# Patient Record
Sex: Female | Born: 1970 | Hispanic: No | Smoking: Never smoker
Health system: Southern US, Community
[De-identification: ages and names within clinical notes are randomized; demographics above are authoritative.]

## PROBLEM LIST (undated history)

## (undated) DIAGNOSIS — F32A Depression, unspecified: Secondary | ICD-10-CM

## (undated) DIAGNOSIS — M549 Dorsalgia, unspecified: Secondary | ICD-10-CM

## (undated) DIAGNOSIS — K810 Acute cholecystitis: Secondary | ICD-10-CM

## (undated) DIAGNOSIS — F419 Anxiety disorder, unspecified: Secondary | ICD-10-CM

## (undated) DIAGNOSIS — E785 Hyperlipidemia, unspecified: Secondary | ICD-10-CM

## (undated) DIAGNOSIS — E119 Type 2 diabetes mellitus without complications: Secondary | ICD-10-CM

## (undated) DIAGNOSIS — T7840XA Allergy, unspecified, initial encounter: Secondary | ICD-10-CM

## (undated) DIAGNOSIS — R7303 Prediabetes: Secondary | ICD-10-CM

## (undated) DIAGNOSIS — F988 Other specified behavioral and emotional disorders with onset usually occurring in childhood and adolescence: Secondary | ICD-10-CM

## (undated) DIAGNOSIS — N3281 Overactive bladder: Secondary | ICD-10-CM

## (undated) DIAGNOSIS — D649 Anemia, unspecified: Secondary | ICD-10-CM

## (undated) DIAGNOSIS — F329 Major depressive disorder, single episode, unspecified: Secondary | ICD-10-CM

## (undated) DIAGNOSIS — E079 Disorder of thyroid, unspecified: Secondary | ICD-10-CM

## (undated) DIAGNOSIS — N760 Acute vaginitis: Secondary | ICD-10-CM

## (undated) HISTORY — PX: FRACTURE SURGERY: SHX138

## (undated) HISTORY — DX: Disorder of thyroid, unspecified: E07.9

## (undated) HISTORY — PX: EYE SURGERY: SHX253

## (undated) HISTORY — DX: Allergy, unspecified, initial encounter: T78.40XA

## (undated) HISTORY — PX: AUGMENTATION MAMMAPLASTY: SUR837

## (undated) HISTORY — DX: Anemia, unspecified: D64.9

## (undated) HISTORY — DX: Major depressive disorder, single episode, unspecified: F32.9

## (undated) HISTORY — DX: Hyperlipidemia, unspecified: E78.5

## (undated) HISTORY — DX: Overactive bladder: N32.81

## (undated) HISTORY — DX: Depression, unspecified: F32.A

## (undated) HISTORY — DX: Anxiety disorder, unspecified: F41.9

## (undated) HISTORY — PX: OTHER SURGICAL HISTORY: SHX169

## (undated) HISTORY — DX: Other specified behavioral and emotional disorders with onset usually occurring in childhood and adolescence: F98.8

## (undated) HISTORY — PX: SMALL INTESTINE SURGERY: SHX150

## (undated) HISTORY — DX: Acute vaginitis: N76.0

## (undated) NOTE — *Deleted (*Deleted)
Chronic Care Management Pharmacy  Name: Lorali Khamis  MRN: [id number] DOB: [birth year]  Chief Complaint/ HPI  Barbara Anderson,  54 y.o. , female presents for their Follow-Up CCM visit with the clinical pharmacist via telephone due to COVID-19 Pandemic.  PCP : Margaretann Loveless, PA-C  Their chronic conditions include: ***  Office Visits:***  Consult Visit:***  Medications: Outpatient Encounter Medications as of 05/16/2020  Medication Sig  . Alcohol Swabs (B-D SINGLE USE SWABS REGULAR) PADS USE TO CLEANSE SKIN EVERY DAY BEFORE CHECKING BLOOD SUGAR  . ALPRAZolam (XANAX) 1 MG tablet Take 1 mg by mouth 2 (two) times daily as needed.   Marland Kitchen amphetamine-dextroamphetamine (ADDERALL) 20 MG tablet Take 1 tablet (20 mg total) by mouth in the morning, at noon, and at bedtime. Has not started yet 11/10/19  . ARIPiprazole (ABILIFY) 5 MG tablet Take 5 mg by mouth daily.  Marland Kitchen atorvastatin (LIPITOR) 20 MG tablet Take 1 tablet (20 mg total) by mouth daily.  . cephALEXin (KEFLEX) 500 MG capsule Take 1 capsule (500 mg total) by mouth 2 (two) times daily.  . Cholecalciferol (VITAMIN D3) 25 MCG (1000 UT) CAPS Take 1 capsule (1,000 Units total) by mouth daily.  . DULoxetine (CYMBALTA) 60 MG capsule Take 2 capsules (120 mg total) by mouth daily.  . fluticasone (FLONASE) 50 MCG/ACT nasal spray USE 2 SPRAYS IN EACH NOSTRIL EVERY DAY  . folic acid (QC FOLIC ACID) 829 MCG tablet Take 1 tablet (800 mcg total) by mouth daily.  . Lancets Misc. (ACCU-CHEK FASTCLIX LANCET) KIT To check blood sugar once daily  . levothyroxine (SYNTHROID) 25 MCG tablet TAKE 1 TABLET EVERY DAY BEFORE BREAKFAST  . pregabalin (LYRICA) 200 MG capsule Take 1 capsule (200 mg total) by mouth 2 (two) times daily.  . QUEtiapine Fumarate (SEROQUEL XR) 150 MG 24 hr tablet Take 150 mg by mouth at bedtime.   . topiramate (TOPAMAX) 200 MG tablet Take 1 tablet (200 mg total) by mouth at bedtime.  . traZODone (DESYREL) 150 MG tablet Take 1 tablet (150  mg total) by mouth at bedtime. (Patient not taking: Reported on 01/13/2020)   No facility-administered encounter medications on file as of 05/16/2020.     Current Diagnosis/Assessment:  Goals Addressed   None    Diabetes   Recent Relevant Labs: Lab Results  Component Value Date/Time   HGBA1C 5.9 (H) 02/04/2020 02:58 PM   HGBA1C 6.0 (H) 10/05/2019 03:28 PM   MICROALBUR 20 02/04/2020 02:46 PM   MICROALBUR 50 07/21/2018 04:34 PM     Checking BG: {CHL HP Blood Glucose Monitoring [id number]  Recent FBG Readings: Recent pre-meal BG readings: *** Recent 2hr PP BG readings:  *** Recent HS BG readings: *** Patient has failed these meds in past: *** Patient is currently {CHL Controlled/Uncontrolled:[phone]} on the following medications: ***  Last diabetic Foot exam: No results found for: HMDIABEYEEXA  Last diabetic Eye exam: No results found for: HMDIABFOOTEX   We discussed: {CHL HP Upstream Pharmacy discussion:[phone]}  Plan  Continue {CHL HP Upstream Pharmacy Plans:[phone]}   Hyperlipidemia   LDL goal < ***  Lipid Panel     Component Value Date/Time   CHOL 157 02/04/2020 1458   TRIG 117 02/04/2020 1458   HDL 55 02/04/2020 1458   LDLCALC 81 02/04/2020 1458    Hepatic Function Latest Ref Rng & Units 02/04/2020 10/05/2019 01/07/2019  Total Protein 6.0 - 8.5 g/dL 5.6(O) 6.9 1.3(Y)  Albumin 3.8 - 4.8 g/dL 3.8 4.2 3.1(L)  AST 0 -  40 IU/L 22 18 35  ALT 0 - 32 IU/L 22 18 35  Alk Phosphatase 48 - 121 IU/L 98 155(H) 90  Total Bilirubin 0.0 - 1.2 mg/dL 0.2 0.3 0.4  Bilirubin, Direct 0.0 - 0.2 mg/dL - - -     The 16-XWRU ASCVD risk score Denman George DC Jr., et al., 2013) is: 2.5%   Values used to calculate the score:     Age: 47 years     Sex: Female     Is Non-Hispanic African American: No     Diabetic: Yes     Tobacco smoker: No     Systolic Blood Pressure: 156 mmHg     Is BP treated: No     HDL Cholesterol: 55 mg/dL     Total Cholesterol: 157 mg/dL    Patient has failed these meds in past: *** Patient is currently {CHL Controlled/[phone]} on the following medications:  . ***  We discussed:  {CHL HP Upstream Pharmacy discussion:[id number]}  Plan  Continue {CHL HP Upstream Pharmacy Plans:[id number]}   Medication Management   Pt uses *** pharmacy for all medications Uses pill box? {Yes or If no, why not?:20788} Pt endorses ***% compliance  We discussed:  Xanax prn? Cymbalta 120mg  daily? Seroquel and Abilify?   Plan  {US Pharmacy EAVW:09811}    Follow up: *** month phone visit  ***

---
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
## 1993-08-12 DIAGNOSIS — F419 Anxiety disorder, unspecified: Secondary | ICD-10-CM | POA: Insufficient documentation

## 1999-01-06 DIAGNOSIS — G542 Cervical root disorders, not elsewhere classified: Secondary | ICD-10-CM | POA: Insufficient documentation

## 1999-01-06 DIAGNOSIS — M5137 Other intervertebral disc degeneration, lumbosacral region: Secondary | ICD-10-CM | POA: Insufficient documentation

## 1999-12-12 ENCOUNTER — Other Ambulatory Visit: Admission: RE | Admit: 1999-12-12 | Discharge: 1999-12-12 | Payer: Self-pay | Admitting: Obstetrics and Gynecology

## 1999-12-12 ENCOUNTER — Other Ambulatory Visit: Admission: RE | Admit: 1999-12-12 | Discharge: 1999-12-12 | Payer: Self-pay | Admitting: *Deleted

## 2000-01-26 ENCOUNTER — Encounter: Payer: Self-pay | Admitting: Emergency Medicine

## 2000-01-26 ENCOUNTER — Emergency Department (HOSPITAL_COMMUNITY): Admission: EM | Admit: 2000-01-26 | Discharge: 2000-01-26 | Payer: Self-pay | Admitting: Emergency Medicine

## 2000-06-26 ENCOUNTER — Encounter: Admission: RE | Admit: 2000-06-26 | Discharge: 2000-06-26 | Payer: Self-pay | Admitting: Neurology

## 2000-06-26 ENCOUNTER — Encounter: Payer: Self-pay | Admitting: Neurology

## 2000-10-07 ENCOUNTER — Other Ambulatory Visit: Admission: RE | Admit: 2000-10-07 | Discharge: 2000-10-07 | Payer: Self-pay | Admitting: Gynecology

## 2001-03-07 ENCOUNTER — Other Ambulatory Visit: Admission: RE | Admit: 2001-03-07 | Discharge: 2001-03-07 | Payer: Self-pay | Admitting: Gynecology

## 2002-05-14 ENCOUNTER — Other Ambulatory Visit: Admission: RE | Admit: 2002-05-14 | Discharge: 2002-05-14 | Payer: Self-pay | Admitting: Gynecology

## 2003-07-31 HISTORY — PX: COSMETIC SURGERY: SHX468

## 2003-12-07 ENCOUNTER — Other Ambulatory Visit: Admission: RE | Admit: 2003-12-07 | Discharge: 2003-12-07 | Payer: Self-pay | Admitting: Gynecology

## 2004-05-31 ENCOUNTER — Ambulatory Visit: Payer: Self-pay | Admitting: Pain Medicine

## 2004-06-07 ENCOUNTER — Ambulatory Visit: Payer: Self-pay | Admitting: Pain Medicine

## 2004-06-15 ENCOUNTER — Other Ambulatory Visit: Admission: RE | Admit: 2004-06-15 | Discharge: 2004-06-15 | Payer: Self-pay | Admitting: Gynecology

## 2004-07-10 ENCOUNTER — Ambulatory Visit: Payer: Self-pay | Admitting: Pain Medicine

## 2004-07-26 ENCOUNTER — Ambulatory Visit: Payer: Self-pay | Admitting: Pain Medicine

## 2004-09-06 ENCOUNTER — Ambulatory Visit: Payer: Self-pay | Admitting: Pain Medicine

## 2004-10-16 ENCOUNTER — Ambulatory Visit: Payer: Self-pay | Admitting: Pain Medicine

## 2004-10-30 ENCOUNTER — Ambulatory Visit: Payer: Self-pay | Admitting: Pain Medicine

## 2004-11-30 ENCOUNTER — Ambulatory Visit: Payer: Self-pay | Admitting: Pain Medicine

## 2004-12-06 ENCOUNTER — Ambulatory Visit: Payer: Self-pay | Admitting: Pain Medicine

## 2005-01-18 ENCOUNTER — Ambulatory Visit: Payer: Self-pay | Admitting: Pain Medicine

## 2005-02-02 ENCOUNTER — Other Ambulatory Visit: Admission: RE | Admit: 2005-02-02 | Discharge: 2005-02-02 | Payer: Self-pay | Admitting: Gynecology

## 2005-02-05 ENCOUNTER — Ambulatory Visit: Payer: Self-pay | Admitting: Pain Medicine

## 2005-02-26 ENCOUNTER — Ambulatory Visit: Payer: Self-pay | Admitting: Pain Medicine

## 2005-03-19 ENCOUNTER — Ambulatory Visit: Payer: Self-pay | Admitting: Pain Medicine

## 2005-04-18 ENCOUNTER — Ambulatory Visit: Payer: Self-pay | Admitting: Pain Medicine

## 2005-05-21 ENCOUNTER — Ambulatory Visit: Payer: Self-pay | Admitting: Pain Medicine

## 2005-05-25 ENCOUNTER — Other Ambulatory Visit: Admission: RE | Admit: 2005-05-25 | Discharge: 2005-05-25 | Payer: Self-pay | Admitting: Gynecology

## 2005-06-04 ENCOUNTER — Ambulatory Visit: Payer: Self-pay | Admitting: Pain Medicine

## 2005-07-16 ENCOUNTER — Ambulatory Visit: Payer: Self-pay | Admitting: Pain Medicine

## 2005-08-14 ENCOUNTER — Ambulatory Visit: Payer: Self-pay | Admitting: Pain Medicine

## 2005-08-22 ENCOUNTER — Ambulatory Visit: Payer: Self-pay | Admitting: Pain Medicine

## 2005-09-17 ENCOUNTER — Ambulatory Visit: Payer: Self-pay | Admitting: Pain Medicine

## 2005-10-02 ENCOUNTER — Ambulatory Visit: Payer: Self-pay | Admitting: Pain Medicine

## 2005-10-08 ENCOUNTER — Ambulatory Visit: Payer: Self-pay | Admitting: Pain Medicine

## 2005-11-06 ENCOUNTER — Ambulatory Visit: Payer: Self-pay | Admitting: Pain Medicine

## 2005-11-12 ENCOUNTER — Ambulatory Visit: Payer: Self-pay | Admitting: Pain Medicine

## 2005-12-26 ENCOUNTER — Ambulatory Visit: Payer: Self-pay | Admitting: Pain Medicine

## 2006-01-22 ENCOUNTER — Ambulatory Visit: Payer: Self-pay

## 2006-02-27 ENCOUNTER — Other Ambulatory Visit: Admission: RE | Admit: 2006-02-27 | Discharge: 2006-02-27 | Payer: Self-pay | Admitting: Gynecology

## 2006-04-16 DIAGNOSIS — G43819 Other migraine, intractable, without status migrainosus: Secondary | ICD-10-CM | POA: Insufficient documentation

## 2006-06-26 ENCOUNTER — Ambulatory Visit: Payer: Self-pay | Admitting: Pain Medicine

## 2006-07-15 ENCOUNTER — Ambulatory Visit: Payer: Self-pay | Admitting: Pain Medicine

## 2006-09-18 ENCOUNTER — Ambulatory Visit: Payer: Self-pay | Admitting: Pain Medicine

## 2006-12-18 ENCOUNTER — Ambulatory Visit: Payer: Self-pay | Admitting: Pain Medicine

## 2007-03-10 ENCOUNTER — Ambulatory Visit: Payer: Self-pay | Admitting: Pain Medicine

## 2007-04-14 ENCOUNTER — Other Ambulatory Visit: Admission: RE | Admit: 2007-04-14 | Discharge: 2007-04-14 | Payer: Self-pay | Admitting: Gynecology

## 2007-05-12 ENCOUNTER — Ambulatory Visit: Payer: Self-pay | Admitting: Pain Medicine

## 2007-06-16 ENCOUNTER — Ambulatory Visit: Payer: Self-pay | Admitting: Pain Medicine

## 2007-12-03 ENCOUNTER — Ambulatory Visit: Payer: Self-pay | Admitting: Pain Medicine

## 2008-03-08 ENCOUNTER — Ambulatory Visit: Payer: Self-pay | Admitting: Pain Medicine

## 2008-07-12 ENCOUNTER — Encounter: Payer: Self-pay | Admitting: Women's Health

## 2008-07-12 ENCOUNTER — Ambulatory Visit: Payer: Self-pay | Admitting: Women's Health

## 2008-07-12 ENCOUNTER — Other Ambulatory Visit: Admission: RE | Admit: 2008-07-12 | Discharge: 2008-07-12 | Payer: Self-pay | Admitting: Gynecology

## 2008-07-19 ENCOUNTER — Ambulatory Visit: Payer: Self-pay | Admitting: Pain Medicine

## 2008-10-04 ENCOUNTER — Ambulatory Visit: Payer: Self-pay

## 2008-10-12 ENCOUNTER — Ambulatory Visit: Payer: Self-pay | Admitting: Gastroenterology

## 2009-01-10 ENCOUNTER — Ambulatory Visit: Payer: Self-pay | Admitting: Pain Medicine

## 2009-04-11 ENCOUNTER — Ambulatory Visit: Payer: Self-pay | Admitting: Pain Medicine

## 2009-07-11 ENCOUNTER — Ambulatory Visit: Payer: Self-pay | Admitting: Pain Medicine

## 2009-07-27 ENCOUNTER — Ambulatory Visit: Payer: Self-pay | Admitting: Pain Medicine

## 2009-08-08 ENCOUNTER — Ambulatory Visit: Payer: Self-pay | Admitting: Pain Medicine

## 2009-08-16 ENCOUNTER — Ambulatory Visit: Payer: Self-pay | Admitting: Pain Medicine

## 2009-09-05 ENCOUNTER — Ambulatory Visit: Payer: Self-pay | Admitting: Pain Medicine

## 2009-09-29 ENCOUNTER — Ambulatory Visit: Payer: Self-pay | Admitting: Pain Medicine

## 2009-10-03 ENCOUNTER — Ambulatory Visit: Payer: Self-pay | Admitting: Pain Medicine

## 2009-10-27 ENCOUNTER — Ambulatory Visit: Payer: Self-pay | Admitting: Pain Medicine

## 2010-04-12 ENCOUNTER — Ambulatory Visit: Payer: Self-pay | Admitting: Pain Medicine

## 2010-05-11 ENCOUNTER — Ambulatory Visit: Payer: Self-pay | Admitting: Pain Medicine

## 2010-05-17 ENCOUNTER — Ambulatory Visit: Payer: Self-pay | Admitting: Pain Medicine

## 2010-07-19 ENCOUNTER — Ambulatory Visit: Payer: Self-pay | Admitting: Pain Medicine

## 2010-09-20 ENCOUNTER — Other Ambulatory Visit (HOSPITAL_COMMUNITY)
Admission: RE | Admit: 2010-09-20 | Discharge: 2010-09-20 | Disposition: A | Discharge status: Home / Self Care | Payer: 59 | Source: Ambulatory Visit | Attending: Gynecology | Admitting: Gynecology

## 2010-09-20 ENCOUNTER — Ambulatory Visit (INDEPENDENT_AMBULATORY_CARE_PROVIDER_SITE_OTHER): Payer: 59 | Admitting: Women's Health

## 2010-09-20 ENCOUNTER — Other Ambulatory Visit: Payer: Self-pay | Admitting: Women's Health

## 2010-09-20 DIAGNOSIS — Z01419 Encounter for gynecological examination (general) (routine) without abnormal findings: Secondary | ICD-10-CM

## 2010-09-20 DIAGNOSIS — Z833 Family history of diabetes mellitus: Secondary | ICD-10-CM

## 2010-09-20 DIAGNOSIS — E079 Disorder of thyroid, unspecified: Secondary | ICD-10-CM

## 2010-09-20 DIAGNOSIS — Z124 Encounter for screening for malignant neoplasm of cervix: Secondary | ICD-10-CM | POA: Insufficient documentation

## 2010-09-20 DIAGNOSIS — Z13220 Encounter for screening for lipoid disorders: Secondary | ICD-10-CM

## 2010-11-08 ENCOUNTER — Ambulatory Visit: Payer: Self-pay | Admitting: Pain Medicine

## 2011-03-19 ENCOUNTER — Ambulatory Visit: Payer: Self-pay | Admitting: Pain Medicine

## 2011-04-03 ENCOUNTER — Ambulatory Visit: Payer: Self-pay | Admitting: Pain Medicine

## 2011-04-11 ENCOUNTER — Ambulatory Visit: Payer: Self-pay | Admitting: Pain Medicine

## 2011-04-27 ENCOUNTER — Other Ambulatory Visit: Payer: Self-pay | Admitting: Women's Health

## 2011-05-10 ENCOUNTER — Ambulatory Visit: Payer: Self-pay | Admitting: Pain Medicine

## 2011-06-04 ENCOUNTER — Ambulatory Visit: Payer: Self-pay | Admitting: Pain Medicine

## 2011-07-11 ENCOUNTER — Ambulatory Visit: Payer: Self-pay | Admitting: Pain Medicine

## 2011-08-01 ENCOUNTER — Other Ambulatory Visit: Payer: Self-pay | Admitting: *Deleted

## 2011-08-01 MED ORDER — FLUCONAZOLE 100 MG PO TABS: 100.0000 mg | ORAL_TABLET | ORAL | Status: DC

## 2011-08-06 ENCOUNTER — Ambulatory Visit: Payer: Self-pay | Admitting: Pain Medicine

## 2011-09-17 DIAGNOSIS — N760 Acute vaginitis: Secondary | ICD-10-CM | POA: Insufficient documentation

## 2011-09-17 DIAGNOSIS — S060XAA Concussion with loss of consciousness status unknown, initial encounter: Secondary | ICD-10-CM | POA: Insufficient documentation

## 2011-09-24 ENCOUNTER — Ambulatory Visit (INDEPENDENT_AMBULATORY_CARE_PROVIDER_SITE_OTHER): Payer: 59 | Admitting: Women's Health

## 2011-09-24 ENCOUNTER — Encounter: Payer: Self-pay | Admitting: Women's Health

## 2011-09-24 VITALS — BP 124/88 | Ht 66.75 in | Wt 160.0 lb

## 2011-09-24 DIAGNOSIS — Z01419 Encounter for gynecological examination (general) (routine) without abnormal findings: Secondary | ICD-10-CM

## 2011-09-24 DIAGNOSIS — Z833 Family history of diabetes mellitus: Secondary | ICD-10-CM

## 2011-09-24 DIAGNOSIS — Z13220 Encounter for screening for lipoid disorders: Secondary | ICD-10-CM

## 2011-09-24 DIAGNOSIS — B373 Candidiasis of vulva and vagina: Secondary | ICD-10-CM

## 2011-09-24 LAB — CBC WITH DIFFERENTIAL/PLATELET
HCT: 40.9 % (ref 36.0–46.0)
Hemoglobin: 13.4 g/dL (ref 12.0–15.0)
Lymphocytes Relative: 24 % (ref 12–46)
Lymphs Abs: 1.2 10*3/uL (ref 0.7–4.0)
Monocytes Absolute: 0.4 10*3/uL (ref 0.1–1.0)
Monocytes Relative: 8 % (ref 3–12)
Neutro Abs: 3.2 10*3/uL (ref 1.7–7.7)
RBC: 4.47 MIL/uL (ref 3.87–5.11)
WBC: 4.9 10*3/uL (ref 4.0–10.5)

## 2011-09-24 MED ORDER — FLUCONAZOLE 100 MG PO TABS: 100.0000 mg | ORAL_TABLET | ORAL | Status: DC

## 2011-09-24 NOTE — Patient Instructions (Signed)
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
## 2011-09-25 LAB — URINALYSIS W MICROSCOPIC + REFLEX CULTURE
Bacteria, UA: NONE SEEN
Bilirubin Urine: NEGATIVE
Crystals: NONE SEEN
Glucose, UA: NEGATIVE mg/dL
Ketones, ur: NEGATIVE mg/dL
Protein, ur: NEGATIVE mg/dL
Urobilinogen, UA: 0.2 mg/dL (ref 0.0–1.0)

## 2011-09-25 LAB — LIPID PANEL
Cholesterol: 180 mg/dL (ref 0–200)
Total CHOL/HDL Ratio: 2.7 Ratio
Triglycerides: 42 mg/dL (ref <150)
VLDL: 8 mg/dL (ref 0–40)

## 2011-09-25 LAB — GLUCOSE, RANDOM: Glucose, Bld: 80 mg/dL (ref 70–99)

## 2011-10-31 ENCOUNTER — Ambulatory Visit: Payer: Self-pay | Admitting: Pain Medicine

## 2012-02-11 ENCOUNTER — Ambulatory Visit: Payer: Self-pay | Admitting: Pain Medicine

## 2012-02-18 ENCOUNTER — Telehealth: Payer: Self-pay | Admitting: Women's Health

## 2012-02-18 DIAGNOSIS — B373 Candidiasis of vulva and vagina: Secondary | ICD-10-CM

## 2012-02-18 MED ORDER — FLUCONAZOLE 100 MG PO TABS: 100.0000 mg | ORAL_TABLET | ORAL | Status: DC

## 2012-02-18 NOTE — Telephone Encounter (Signed)
Barbara Anderson, I am going to put the fax request on your desk for you to sign so I can just fax this back to pharmacy. Thanks!!

## 2012-05-14 ENCOUNTER — Ambulatory Visit: Payer: Self-pay | Admitting: Pain Medicine

## 2012-06-04 ENCOUNTER — Ambulatory Visit: Payer: Self-pay | Admitting: Pain Medicine

## 2012-09-10 ENCOUNTER — Ambulatory Visit: Payer: Self-pay | Admitting: Pain Medicine

## 2012-11-10 ENCOUNTER — Ambulatory Visit: Payer: Self-pay | Admitting: Pain Medicine

## 2013-01-07 ENCOUNTER — Other Ambulatory Visit (HOSPITAL_COMMUNITY)
Admission: RE | Admit: 2013-01-07 | Discharge: 2013-01-07 | Disposition: A | Discharge status: Home / Self Care | Payer: 59 | Source: Ambulatory Visit | Attending: Gynecology | Admitting: Gynecology

## 2013-01-07 ENCOUNTER — Ambulatory Visit (INDEPENDENT_AMBULATORY_CARE_PROVIDER_SITE_OTHER): Payer: 59 | Admitting: Women's Health

## 2013-01-07 ENCOUNTER — Encounter: Payer: Self-pay | Admitting: Women's Health

## 2013-01-07 VITALS — BP 120/84 | Ht 67.0 in | Wt 216.0 lb

## 2013-01-07 DIAGNOSIS — N898 Other specified noninflammatory disorders of vagina: Secondary | ICD-10-CM

## 2013-01-07 DIAGNOSIS — Z01419 Encounter for gynecological examination (general) (routine) without abnormal findings: Secondary | ICD-10-CM

## 2013-01-07 DIAGNOSIS — B373 Candidiasis of vulva and vagina: Secondary | ICD-10-CM

## 2013-01-07 LAB — WET PREP FOR TRICH, YEAST, CLUE: Trich, Wet Prep: NONE SEEN

## 2013-01-07 MED ORDER — FLUCONAZOLE 100 MG PO TABS: ORAL_TABLET | ORAL | Status: DC

## 2013-01-07 NOTE — Patient Instructions (Addendum)
Schedule mammogram!!!! Health Maintenance, Females A healthy lifestyle and preventative care can promote health and wellness.  Maintain regular health, dental, and eye exams.  Eat a healthy diet. Foods like vegetables, fruits, whole grains, low-fat dairy products, and lean protein foods contain the nutrients you need without too many calories. Decrease your intake of foods high in solid fats, added sugars, and salt. Get information about a proper diet from your caregiver, if necessary.  Regular physical exercise is one of the most important things you can do for your health. Most adults should get at least 150 minutes of moderate-intensity exercise (any activity that increases your heart rate and causes you to sweat) each week. In addition, most adults need muscle-strengthening exercises on 2 or more days a week.   Maintain a healthy weight. The body mass index (BMI) is a screening tool to identify possible weight problems. It provides an estimate of body fat based on height and weight. Your caregiver can help determine your BMI, and can help you achieve or maintain a healthy weight. For adults 20 years and older:  A BMI below 18.5 is considered underweight.  A BMI of 18.5 to 24.9 is normal.  A BMI of 25 to 29.9 is considered overweight.  A BMI of 30 and above is considered obese.  Maintain normal blood lipids and cholesterol by exercising and minimizing your intake of saturated fat. Eat a balanced diet with plenty of fruits and vegetables. Blood tests for lipids and cholesterol should begin at age 54 and be repeated every 5 years. If your lipid or cholesterol levels are high, you are over 50, or you are a high risk for heart disease, you may need your cholesterol levels checked more frequently.Ongoing high lipid and cholesterol levels should be treated with medicines if diet and exercise are not effective.  If you smoke, find out from your caregiver how to quit. If you do not use tobacco, do  not start.  If you are pregnant, do not drink alcohol. If you are breastfeeding, be very cautious about drinking alcohol. If you are not pregnant and choose to drink alcohol, do not exceed 1 drink per day. One drink is considered to be 12 ounces (355 mL) of beer, 5 ounces (148 mL) of wine, or 1.5 ounces (44 mL) of liquor.  Avoid use of street drugs. Do not share needles with anyone. Ask for help if you need support or instructions about stopping the use of drugs.  High blood pressure causes heart disease and increases the risk of stroke. Blood pressure should be checked at least every 1 to 2 years. Ongoing high blood pressure should be treated with medicines, if weight loss and exercise are not effective.  If you are 4 to 44 years old, ask your caregiver if you should take aspirin to prevent strokes.  Diabetes screening involves taking a blood sample to check your fasting blood sugar level. This should be done once every 3 years, after age 54, if you are within normal weight and without risk factors for diabetes. Testing should be considered at a younger age or be carried out more frequently if you are overweight and have at least 1 risk factor for diabetes.  Breast cancer screening is essential preventative care for women. You should practice "breast self-awareness." This means understanding the normal appearance and feel of your breasts and may include breast self-examination. Any changes detected, no matter how small, should be reported to a caregiver. Women in their 74s and 30s  should have a clinical breast exam (CBE) by a caregiver as part of a regular health exam every 1 to 3 years. After age 35, women should have a CBE every year. Starting at age 58, women should consider having a mammogram (breast X-ray) every year. Women who have a family history of breast cancer should talk to their caregiver about genetic screening. Women at a high risk of breast cancer should talk to their caregiver about  having an MRI and a mammogram every year.  The Pap test is a screening test for cervical cancer. Women should have a Pap test starting at age 65. Between ages 42 and 29, Pap tests should be repeated every 2 years. Beginning at age 18, you should have a Pap test every 3 years as long as the past 3 Pap tests have been normal. If you had a hysterectomy for a problem that was not cancer or a condition that could lead to cancer, then you no longer need Pap tests. If you are between ages 41 and 17, and you have had normal Pap tests going back 10 years, you no longer need Pap tests. If you have had past treatment for cervical cancer or a condition that could lead to cancer, you need Pap tests and screening for cancer for at least 20 years after your treatment. If Pap tests have been discontinued, risk factors (such as a new sexual partner) need to be reassessed to determine if screening should be resumed. Some women have medical problems that increase the chance of getting cervical cancer. In these cases, your caregiver may recommend more frequent screening and Pap tests.  The human papillomavirus (HPV) test is an additional test that may be used for cervical cancer screening. The HPV test looks for the virus that can cause the cell changes on the cervix. The cells collected during the Pap test can be tested for HPV. The HPV test could be used to screen women aged 61 years and older, and should be used in women of any age who have unclear Pap test results. After the age of 74, women should have HPV testing at the same frequency as a Pap test.  Colorectal cancer can be detected and often prevented. Most routine colorectal cancer screening begins at the age of 29 and continues through age 66. However, your caregiver may recommend screening at an earlier age if you have risk factors for colon cancer. On a yearly basis, your caregiver may provide home test kits to check for hidden blood in the stool. Use of a small  camera at the end of a tube, to directly examine the colon (sigmoidoscopy or colonoscopy), can detect the earliest forms of colorectal cancer. Talk to your caregiver about this at age 68, when routine screening begins. Direct examination of the colon should be repeated every 5 to 10 years through age 11, unless early forms of pre-cancerous polyps or small growths are found.  Hepatitis C blood testing is recommended for all people born from [birth year] through 1967 and any individual with known risks for hepatitis C.  Practice safe sex. Use condoms and avoid high-risk sexual practices to reduce the spread of sexually transmitted infections (STIs). Sexually active women aged 62 and younger should be checked for Chlamydia, which is a common sexually transmitted infection. Older women with new or multiple partners should also be tested for Chlamydia. Testing for other STIs is recommended if you are sexually active and at increased risk.  Osteoporosis is a disease in  which the bones lose minerals and strength with aging. This can result in serious bone fractures. The risk of osteoporosis can be identified using a bone density scan. Women ages 35 and over and women at risk for fractures or osteoporosis should discuss screening with their caregivers. Ask your caregiver whether you should be taking a calcium supplement or vitamin D to reduce the rate of osteoporosis.  Menopause can be associated with physical symptoms and risks. Hormone replacement therapy is available to decrease symptoms and risks. You should talk to your caregiver about whether hormone replacement therapy is right for you.  Use sunscreen with a sun protection factor (SPF) of 30 or greater. Apply sunscreen liberally and repeatedly throughout the day. You should seek shade when your shadow is shorter than you. Protect yourself by wearing long sleeves, pants, a wide-brimmed hat, and sunglasses year round, whenever you are outdoors.  Notify your  caregiver of new moles or changes in moles, especially if there is a change in shape or color. Also notify your caregiver if a mole is larger than the size of a pencil eraser.  Stay current with your immunizations. Document Released: 01/29/2011 Document Revised: 10/08/2011 Document Reviewed: 01/29/2011 [facility] Patient Information 2014 [place], [place].

## 2013-01-07 NOTE — Progress Notes (Signed)
Barbara Anderson [birth year] [id number]    History:    The patient presents for annual exam.  Requests IUD to be removed/desires pregnancy. Rare cycle with Mirena IUD. Normal Pap and mammogram history. Head injury/concussion at work 2001 caused short-term memory issues with headaches on disability. Depression managed by Dr. Delle Reining currently on Pristique, Cymbalta and Adderall. Has gained greater than 50 pounds in past year. Had cosmetic surgery in 2005 after weight loss on arms, tummy tuck and breast augmentation.   Past medical history, past surgical history, family history and social history were all reviewed and documented in the EPIC chart. Parents type 2 diabetes and hypertension.   ROS:  A  ROS was performed and pertinent positives and negatives are included in the history.  Exam:  Filed Vitals:   01/07/13 0918  BP: 120/84    General appearance:  Wearing sunglasses Head/Neck:  Normal, without cervical or supraclavicular adenopathy. Thyroid:  Symmetrical, normal in size, without palpable masses or nodularity. Respiratory  Effort:  Normal  Auscultation:  Clear without wheezing or rhonchi Cardiovascular  Auscultation:  Regular rate, without rubs, murmurs or gallops  Edema/varicosities:  Not grossly evident Abdominal  Soft,nontender, without masses, guarding or rebound.  Liver/spleen:  No organomegaly noted  Hernia:  None appreciated  Skin  Inspection:  Grossly normal  Palpation:  Grossly normal Neurologic/psychiatric  Orientation:  Normal with appropriate conversation.  Mood/affect:  Normal  Genitourinary    Breasts: Examined lying and sitting/saline implants.     Right: Without masses, retractions, discharge or axillary adenopathy.     Left: Without masses, retractions, discharge or axillary adenopathy.   Inguinal/mons:  Normal without inguinal adenopathy  External genitalia:  Normal  BUS/Urethra/Skene's glands:  Normal  Bladder:  Normal  Vagina:  Normal  Cervix:   Normal IUD strings visible removed intact and discarded  Uterus:   normal in size, shape and contour.  Midline and mobile  Adnexa/parametria:     Rt: Without masses or tenderness.   Lt: Without masses or tenderness.  Anus and perineum: Normal  Digital rectal exam: Normal sphincter tone without palpated masses or tenderness  Assessment/Plan:  42 y.o.  [name] for annual exam requested IUD to be removed and desires pregnancy.  Depression-Dr. Leeanne Rio medications Head injury/memory and headache issues Mirena IUD removed intact  50 pound weight gain in the past year  Plan: Reviewed potential hazards of pregnancy with current medications. States is doing better on combination medications now, reviewed category C&D drugs that would not be safe with pregnancy. States if does conceive will work with Dr. Evelene Croon to change medications. States does not want to pursue fertility but if conceives would be happy. Multivitamin daily encouraged. Safe pregnancy behaviors reviewed. SBE's, continue annual mammogram, calcium rich diet, decrease calories for weight loss, exercise best as able encouraged. UA, Pap, Pap normal 2012, new screening guidelines reviewed. History of recurrent yeast, wet prep negative reviewed normality of exam and wet prep.      Harrington Challenger [facility], 12:56 PM 01/07/2013

## 2013-01-14 ENCOUNTER — Telehealth: Payer: Self-pay | Admitting: *Deleted

## 2013-01-14 MED ORDER — FLUCONAZOLE 100 MG PO TABS: ORAL_TABLET | ORAL | Status: DC

## 2013-01-14 NOTE — Telephone Encounter (Signed)
Pharmacy faxed request for clarification of diflucan medication, I place the correct direction in meds & orders.

## 2013-01-21 ENCOUNTER — Encounter: Payer: Self-pay | Admitting: Gynecology

## 2013-02-23 ENCOUNTER — Emergency Department: Payer: Self-pay | Admitting: Emergency Medicine

## 2013-03-17 ENCOUNTER — Telehealth: Payer: Self-pay

## 2013-03-17 DIAGNOSIS — B373 Candidiasis of vulva and vagina: Secondary | ICD-10-CM

## 2013-03-17 NOTE — Telephone Encounter (Signed)
[facility] faxed a form requesting refill on generic Diflucan 100mg  tabs.  They request a 90 day supply.  Not sure if this was something you wanted to keep her on long term.  Need to know what you want to do regarding refill

## 2013-03-18 ENCOUNTER — Other Ambulatory Visit: Payer: Self-pay | Admitting: Women's Health

## 2013-03-18 DIAGNOSIS — B373 Candidiasis of vulva and vagina: Secondary | ICD-10-CM

## 2013-03-18 MED ORDER — FLUCONAZOLE 100 MG PO TABS: ORAL_TABLET | ORAL | Status: DC

## 2013-03-18 NOTE — Telephone Encounter (Signed)
Left message for patient to call me

## 2013-03-18 NOTE — Telephone Encounter (Signed)
Please call patient and review Diflucan 100 only to be taken as needed, not daily, toxic to the liver. We can call in 30 tablets, which should last at least 90 days. She has had problems with recurrent/chronic yeast

## 2013-03-23 ENCOUNTER — Other Ambulatory Visit: Payer: Self-pay | Admitting: Women's Health

## 2013-03-23 DIAGNOSIS — B373 Candidiasis of vulva and vagina: Secondary | ICD-10-CM

## 2013-03-23 MED ORDER — FLUCONAZOLE 100 MG PO TABS: ORAL_TABLET | ORAL | Status: DC

## 2013-03-24 NOTE — Telephone Encounter (Signed)
Patient informed. 

## 2013-03-27 ENCOUNTER — Telehealth: Payer: Self-pay

## 2013-03-27 ENCOUNTER — Other Ambulatory Visit: Payer: Self-pay | Admitting: Women's Health

## 2013-03-27 DIAGNOSIS — B373 Candidiasis of vulva and vagina: Secondary | ICD-10-CM

## 2013-03-27 MED ORDER — FLUCONAZOLE 100 MG PO TABS: ORAL_TABLET | ORAL | Status: DC

## 2013-03-27 NOTE — Telephone Encounter (Signed)
Patient called and said [facility] was holding up sending her Diflucan refill as they need authorization from provider.  I resent the refill rx today. Patient advised.

## 2013-04-27 ENCOUNTER — Ambulatory Visit: Payer: Self-pay | Admitting: Pain Medicine

## 2013-05-26 ENCOUNTER — Encounter: Payer: Self-pay | Admitting: Women's Health

## 2013-05-26 ENCOUNTER — Ambulatory Visit (INDEPENDENT_AMBULATORY_CARE_PROVIDER_SITE_OTHER): Payer: 59 | Admitting: Women's Health

## 2013-05-26 DIAGNOSIS — N912 Amenorrhea, unspecified: Secondary | ICD-10-CM

## 2013-05-26 DIAGNOSIS — N949 Unspecified condition associated with female genital organs and menstrual cycle: Secondary | ICD-10-CM

## 2013-05-26 DIAGNOSIS — Z113 Encounter for screening for infections with a predominantly sexual mode of transmission: Secondary | ICD-10-CM

## 2013-05-26 DIAGNOSIS — N898 Other specified noninflammatory disorders of vagina: Secondary | ICD-10-CM

## 2013-05-26 LAB — WET PREP FOR TRICH, YEAST, CLUE: Trich, Wet Prep: NONE SEEN

## 2013-05-26 LAB — RPR

## 2013-05-26 MED ORDER — METRONIDAZOLE 0.75 % VA GEL: VAGINAL | Status: DC

## 2013-05-26 NOTE — Patient Instructions (Signed)
Bacterial Vaginosis  Bacterial vaginosis is an infection of the vagina. A healthy vagina has many kinds of good germs (bacteria). Sometimes the number of good germs can change. This allows bad germs to move in and cause an infection. You may be given medicine (antibiotics) to treat the infection. Or, you may not need treatment at all.  HOME CARE   Take your medicine as told. Finish them even if you start to feel better.   Do not have sex until you finish your medicine.   Do not douche.   Practice safe sex.   Tell your sex partner that you have an infection. They should see their doctor for treatment if they have problems.  GET HELP RIGHT AWAY IF:   You do not get better after 3 days of treatment.   You have grey fluid (discharge) coming from your vagina.   You have pain.   You have a temperature of 102 F (38.9 C) or higher.  MAKE SURE YOU:    Understand these instructions.   Will watch your condition.   Will get help right away if you are not doing well or get worse.  Document Released: 04/24/2008 Document Revised: 10/08/2011 Document Reviewed: 04/24/2008  [employer] Patient Information 2014 [employer].
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
## 2013-05-27 LAB — HCG, SERUM, QUALITATIVE: Preg, Serum: NEGATIVE

## 2013-05-27 LAB — HEPATITIS C ANTIBODY: HCV Ab: NEGATIVE

## 2013-05-27 LAB — FOLLICLE STIMULATING HORMONE: FSH: 5.8 m[IU]/mL

## 2013-05-28 ENCOUNTER — Other Ambulatory Visit: Payer: Self-pay | Admitting: Women's Health

## 2013-05-28 LAB — HSV(HERPES SMPLX)ABS-I+II(IGG+IGM)-BLD
HSV 1 Glycoprotein G Ab, IgG: 10.39 IV — ABNORMAL HIGH
HSV 2 Glycoprotein G Ab, IgG: <0.1 IV
Herpes Simplex Vrs I&II-IgM Ab (EIA): 1.75 INDEX — ABNORMAL HIGH

## 2013-05-28 LAB — GC/CHLAMYDIA PROBE AMP: GC Probe RNA: NEGATIVE

## 2013-05-28 MED ORDER — MEDROXYPROGESTERONE ACETATE 10 MG PO TABS: ORAL_TABLET | ORAL | Status: DC

## 2013-06-17 ENCOUNTER — Telehealth: Payer: Self-pay | Admitting: *Deleted

## 2013-06-17 NOTE — Telephone Encounter (Signed)
Pt called to follow up from OV on 05/26/13, pt is unable to hold her urine wearing depends now. Pt said having bowl movements fine, just urine incontinence, pt would like you recommendations? Please advise

## 2013-06-17 NOTE — Telephone Encounter (Signed)
Please call, office visit would be best to check a clean catch UA. Possibly schedule for urodynamics.

## 2013-06-17 NOTE — Telephone Encounter (Signed)
Pt informed with the below note, transferred to front desk to schedule. 

## 2013-06-18 ENCOUNTER — Encounter: Payer: Self-pay | Admitting: Women's Health

## 2013-06-18 ENCOUNTER — Ambulatory Visit (INDEPENDENT_AMBULATORY_CARE_PROVIDER_SITE_OTHER): Payer: 59 | Admitting: Women's Health

## 2013-06-18 DIAGNOSIS — N898 Other specified noninflammatory disorders of vagina: Secondary | ICD-10-CM

## 2013-06-18 DIAGNOSIS — R339 Retention of urine, unspecified: Secondary | ICD-10-CM

## 2013-06-18 DIAGNOSIS — R3989 Other symptoms and signs involving the genitourinary system: Secondary | ICD-10-CM

## 2013-06-18 LAB — URINALYSIS W MICROSCOPIC + REFLEX CULTURE
Glucose, UA: NEGATIVE mg/dL
Hgb urine dipstick: NEGATIVE
Ketones, ur: NEGATIVE mg/dL
Leukocytes, UA: NEGATIVE
Urobilinogen, UA: 0.2 mg/dL (ref 0.0–1.0)
pH: 8.5 — ABNORMAL HIGH (ref 5.0–8.0)

## 2013-06-18 LAB — WET PREP FOR TRICH, YEAST, CLUE: Yeast Wet Prep HPF POC: NONE SEEN
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
## 2013-07-02 ENCOUNTER — Encounter: Payer: Self-pay | Admitting: Women's Health

## 2013-07-02 ENCOUNTER — Ambulatory Visit (INDEPENDENT_AMBULATORY_CARE_PROVIDER_SITE_OTHER): Payer: 59 | Admitting: Women's Health

## 2013-07-02 DIAGNOSIS — Z113 Encounter for screening for infections with a predominantly sexual mode of transmission: Secondary | ICD-10-CM

## 2013-07-02 DIAGNOSIS — N912 Amenorrhea, unspecified: Secondary | ICD-10-CM

## 2013-07-02 LAB — HIV ANTIBODY (ROUTINE TESTING W REFLEX): HIV: NONREACTIVE

## 2013-07-02 LAB — RPR
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
## 2013-07-08 ENCOUNTER — Telehealth: Payer: Self-pay

## 2013-07-08 MED ORDER — SOLIFENACIN SUCCINATE 5 MG PO TABS: ORAL_TABLET | ORAL | Status: DC

## 2013-07-08 NOTE — Telephone Encounter (Signed)
Patient asked if you could give her a prescription for Vesicare that you gave her sample of for her for overactive bladder. She said it really helped her.

## 2013-07-08 NOTE — Telephone Encounter (Signed)
rx sent, pt aware 

## 2013-07-08 NOTE — Telephone Encounter (Signed)
Message copied by Keenan Bachelor on Wed Jul 08, 2013  9:47 AM ------      Message from: McSwain, Davonna Belling      Created: Fri Jul 03, 2013  8:34 AM       Please call and inform STD screen all negative. In urine pregnancy test negative. ------

## 2013-07-08 NOTE — Telephone Encounter (Signed)
Okay,: VESIcare 5 mg daily with refills. Reviewed most common side effects are dry mouth and constipation.

## 2013-07-17 ENCOUNTER — Ambulatory Visit: Payer: Self-pay | Admitting: Pain Medicine

## 2013-07-20 ENCOUNTER — Telehealth: Payer: Self-pay

## 2013-07-20 NOTE — Telephone Encounter (Signed)
Vesicare was very expensive on her ins plan and patient cannot afford.  Wants to see if one of these other Rx's might be an option. She said Oxycontin HCL 5mg  or Oxybutin Chloride ER Extended Tab 15 mg  Are both less expensive options costing her $15.  She said they medication was really helping but now she is out.

## 2013-07-20 NOTE — Telephone Encounter (Signed)
Ditropan XL 5 mg daily, review common side effects are constipation, drowsiness, and possible Headache. Have her call if problems.

## 2013-07-21 ENCOUNTER — Other Ambulatory Visit: Payer: Self-pay | Admitting: Women's Health

## 2013-07-21 MED ORDER — OXYBUTYNIN CHLORIDE ER 5 MG PO TB24: 5.0000 mg | ORAL_TABLET | Freq: Every day | ORAL | Status: DC

## 2013-07-21 NOTE — Telephone Encounter (Signed)
Left detailed message on voice mail. To call if questions. Rx sent.

## 2013-07-28 ENCOUNTER — Telehealth: Payer: Self-pay | Admitting: *Deleted

## 2013-07-28 MED ORDER — OXYBUTYNIN CHLORIDE ER 5 MG PO TB24: 5.0000 mg | ORAL_TABLET | Freq: Every day | ORAL | Status: DC

## 2013-07-28 NOTE — Telephone Encounter (Signed)
Pt called requesting Ditropan XL 5 mg daily sent to [facility]. Rx will be sent.

## 2013-08-03 ENCOUNTER — Ambulatory Visit: Payer: Self-pay | Admitting: Pain Medicine

## 2013-10-21 ENCOUNTER — Emergency Department: Payer: Self-pay | Admitting: Emergency Medicine

## 2013-10-21 LAB — CBC
HCT: 40 % (ref 35.0–47.0)
HGB: 12.9 g/dL (ref 12.0–16.0)
MCH: 28.3 pg (ref 26.0–34.0)
MCHC: 32.2 g/dL (ref 32.0–36.0)
MCV: 88 fL (ref 80–100)
PLATELETS: 165 10*3/uL (ref 150–440)
RBC: 4.54 10*6/uL (ref 3.80–5.20)
RDW: 15.3 % — ABNORMAL HIGH (ref 11.5–14.5)
WBC: 8.1 10*3/uL (ref 3.6–11.0)

## 2013-10-21 LAB — COMPREHENSIVE METABOLIC PANEL
ALBUMIN: 3.9 g/dL (ref 3.4–5.0)
AST: 21 U/L (ref 15–37)
Alkaline Phosphatase: 129 U/L — ABNORMAL HIGH
Anion Gap: 6 — ABNORMAL LOW (ref 7–16)
BUN: 10 mg/dL (ref 7–18)
Bilirubin,Total: 0.6 mg/dL (ref 0.2–1.0)
CALCIUM: 8.6 mg/dL (ref 8.5–10.1)
CHLORIDE: 104 mmol/L (ref 98–107)
CO2: 27 mmol/L (ref 21–32)
Creatinine: 1.03 mg/dL (ref 0.60–1.30)
EGFR (African American): >60
EGFR (Non-African Amer.): >60
Glucose: 73 mg/dL (ref 65–99)
Osmolality: 271 (ref 275–301)
POTASSIUM: 3.2 mmol/L — AB (ref 3.5–5.1)
SGPT (ALT): 18 U/L (ref 12–78)
Sodium: 137 mmol/L (ref 136–145)
TOTAL PROTEIN: 7.8 g/dL (ref 6.4–8.2)

## 2013-10-21 LAB — DRUG SCREEN, URINE
AMPHETAMINES, UR SCREEN: POSITIVE (ref ?–1000)
Barbiturates, Ur Screen: NEGATIVE (ref ?–200)
Benzodiazepine, Ur Scrn: POSITIVE (ref ?–200)
CANNABINOID 50 NG, UR ~~LOC~~: NEGATIVE (ref ?–50)
Cocaine Metabolite,Ur ~~LOC~~: NEGATIVE (ref ?–300)
MDMA (Ecstasy)Ur Screen: POSITIVE (ref ?–500)
Methadone, Ur Screen: NEGATIVE (ref ?–300)
OPIATE, UR SCREEN: NEGATIVE (ref ?–300)
Phencyclidine (PCP) Ur S: NEGATIVE (ref ?–25)
Tricyclic, Ur Screen: NEGATIVE (ref ?–1000)

## 2013-10-21 LAB — URINALYSIS, COMPLETE
BLOOD: NEGATIVE
Bacteria: NONE SEEN
Bilirubin,UR: NEGATIVE
GLUCOSE, UR: NEGATIVE mg/dL (ref 0–75)
Leukocyte Esterase: NEGATIVE
Nitrite: NEGATIVE
Ph: 6 (ref 4.5–8.0)
RBC, UR: NONE SEEN /HPF (ref 0–5)
SPECIFIC GRAVITY: 1.023 (ref 1.003–1.030)
Squamous Epithelial: 4
WBC UR: 1 /HPF (ref 0–5)

## 2013-10-21 LAB — TSH: Thyroid Stimulating Horm: 2.41 u[IU]/mL

## 2013-10-21 LAB — ETHANOL: Ethanol %: <0.003 % (ref 0.000–0.080)

## 2014-01-08 ENCOUNTER — Encounter: Payer: 59 | Admitting: Women's Health

## 2014-02-09 ENCOUNTER — Telehealth: Payer: Self-pay | Admitting: *Deleted

## 2014-02-09 NOTE — Telephone Encounter (Signed)
Pt has annual scheduled on 02/16/14 c/o yeast infection lives in [place] requesting diflucan. Please advise

## 2014-02-09 NOTE — Telephone Encounter (Signed)
Ok please call in diflucan 150 for pt.  thanks

## 2014-02-10 MED ORDER — FLUCONAZOLE 150 MG PO TABS: 150.0000 mg | ORAL_TABLET | Freq: Once | ORAL | Status: DC

## 2014-02-10 NOTE — Telephone Encounter (Signed)
Left on voicemail rx sent.

## 2014-02-15 ENCOUNTER — Ambulatory Visit: Payer: Self-pay | Admitting: Pain Medicine

## 2014-02-16 ENCOUNTER — Encounter: Payer: Medicare Other | Admitting: Women's Health

## 2014-04-16 ENCOUNTER — Other Ambulatory Visit: Payer: Self-pay | Admitting: Women's Health

## 2014-04-16 NOTE — Telephone Encounter (Signed)
Ok, office visit if no relief

## 2014-04-22 ENCOUNTER — Ambulatory Visit (INDEPENDENT_AMBULATORY_CARE_PROVIDER_SITE_OTHER): Payer: 59 | Admitting: Women's Health

## 2014-04-22 ENCOUNTER — Encounter: Payer: Self-pay | Admitting: Women's Health

## 2014-04-22 ENCOUNTER — Other Ambulatory Visit (HOSPITAL_COMMUNITY)
Admission: RE | Admit: 2014-04-22 | Discharge: 2014-04-22 | Disposition: A | Discharge status: Home / Self Care | Payer: 59 | Source: Ambulatory Visit | Attending: Gynecology | Admitting: Gynecology

## 2014-04-22 VITALS — BP 130/84 | Ht 67.0 in | Wt 228.0 lb

## 2014-04-22 DIAGNOSIS — B373 Candidiasis of vulva and vagina: Secondary | ICD-10-CM

## 2014-04-22 DIAGNOSIS — Z01419 Encounter for gynecological examination (general) (routine) without abnormal findings: Secondary | ICD-10-CM

## 2014-04-22 DIAGNOSIS — B3731 Acute candidiasis of vulva and vagina: Secondary | ICD-10-CM

## 2014-04-22 DIAGNOSIS — N912 Amenorrhea, unspecified: Secondary | ICD-10-CM

## 2014-04-22 DIAGNOSIS — Z113 Encounter for screening for infections with a predominantly sexual mode of transmission: Secondary | ICD-10-CM

## 2014-04-22 LAB — URINALYSIS W MICROSCOPIC + REFLEX CULTURE
BILIRUBIN URINE: NEGATIVE
Glucose, UA: NEGATIVE mg/dL
Hgb urine dipstick: NEGATIVE
Leukocytes, UA: NEGATIVE
NITRITE: NEGATIVE
PROTEIN: NEGATIVE mg/dL
SPECIFIC GRAVITY, URINE: 1.015 (ref 1.005–1.030)
UROBILINOGEN UA: 0.2 mg/dL (ref 0.0–1.0)
pH: 8.5 — ABNORMAL HIGH (ref 5.0–8.0)

## 2014-04-22 LAB — WET PREP FOR TRICH, YEAST, CLUE
CLUE CELLS WET PREP: NONE SEEN
Trich, Wet Prep: NONE SEEN

## 2014-04-22 MED ORDER — FLUCONAZOLE 150 MG PO TABS: 150.0000 mg | ORAL_TABLET | Freq: Once | ORAL | Status: DC
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
## 2014-04-22 NOTE — Patient Instructions (Signed)

## 2014-04-23 ENCOUNTER — Telehealth: Payer: Self-pay | Admitting: *Deleted

## 2014-04-23 ENCOUNTER — Other Ambulatory Visit: Payer: Self-pay | Admitting: *Deleted

## 2014-04-23 DIAGNOSIS — B373 Candidiasis of vulva and vagina: Secondary | ICD-10-CM

## 2014-04-23 DIAGNOSIS — B3731 Acute candidiasis of vulva and vagina: Secondary | ICD-10-CM

## 2014-04-23 LAB — CBC WITH DIFFERENTIAL/PLATELET
BASOS ABS: 0.1 10*3/uL (ref 0.0–0.1)
Basophils Relative: 1 % (ref 0–1)
EOS ABS: 0.1 10*3/uL (ref 0.0–0.7)
EOS PCT: 1 % (ref 0–5)
HEMATOCRIT: 37 % (ref 36.0–46.0)
Hemoglobin: 12.2 g/dL (ref 12.0–15.0)
LYMPHS ABS: 1.3 10*3/uL (ref 0.7–4.0)
Lymphocytes Relative: 20 % (ref 12–46)
MCH: 27.9 pg (ref 26.0–34.0)
MCHC: 33 g/dL (ref 30.0–36.0)
MCV: 84.7 fL (ref 78.0–100.0)
Monocytes Absolute: 0.4 10*3/uL (ref 0.1–1.0)
Monocytes Relative: 6 % (ref 3–12)
Neutro Abs: 4.5 10*3/uL (ref 1.7–7.7)
Neutrophils Relative %: 72 % (ref 43–77)
PLATELETS: 169 10*3/uL (ref 150–400)
RBC: 4.37 MIL/uL (ref 3.87–5.11)
RDW: 14.5 % (ref 11.5–15.5)
WBC: 6.3 10*3/uL (ref 4.0–10.5)

## 2014-04-23 LAB — COMPREHENSIVE METABOLIC PANEL
ALT: 11 U/L (ref 0–35)
AST: 14 U/L (ref 0–37)
Albumin: 4.2 g/dL (ref 3.5–5.2)
Alkaline Phosphatase: 111 U/L (ref 39–117)
BUN: 7 mg/dL (ref 6–23)
CALCIUM: 9 mg/dL (ref 8.4–10.5)
CHLORIDE: 102 meq/L (ref 96–112)
CO2: 25 meq/L (ref 19–32)
CREATININE: 0.79 mg/dL (ref 0.50–1.10)
Glucose, Bld: 101 mg/dL — ABNORMAL HIGH (ref 70–99)
Potassium: 3.4 mEq/L — ABNORMAL LOW (ref 3.5–5.3)
Sodium: 137 mEq/L (ref 135–145)
Total Bilirubin: 0.7 mg/dL (ref 0.2–1.2)
Total Protein: 6.7 g/dL (ref 6.0–8.3)

## 2014-04-23 LAB — GC/CHLAMYDIA PROBE AMP
CT Probe RNA: NEGATIVE
GC Probe RNA: NEGATIVE

## 2014-04-23 LAB — HIV ANTIBODY (ROUTINE TESTING W REFLEX): HIV 1&2 Ab, 4th Generation: NONREACTIVE

## 2014-04-23 LAB — HEPATITIS C ANTIBODY: HCV Ab: NEGATIVE

## 2014-04-23 LAB — RPR

## 2014-04-23 LAB — HEPATITIS B SURFACE ANTIGEN: HEP B S AG: NEGATIVE

## 2014-04-23 LAB — FOLLICLE STIMULATING HORMONE: FSH: 6 m[IU]/mL

## 2014-04-23 MED ORDER — TOLTERODINE TARTRATE ER 4 MG PO CP24: 4.0000 mg | ORAL_CAPSULE | Freq: Every day | ORAL | Status: DC

## 2014-04-23 MED ORDER — FLUCONAZOLE 150 MG PO TABS: 150.0000 mg | ORAL_TABLET | Freq: Once | ORAL | Status: DC

## 2014-04-23 MED ORDER — MEDROXYPROGESTERONE ACETATE 10 MG PO TABS: 10.0000 mg | ORAL_TABLET | Freq: Every day | ORAL | Status: DC

## 2014-04-23 NOTE — Telephone Encounter (Signed)
Pt calling c/o that the oxybutynin is not helping and she is unable to control her bladder. Pt said she is wearing depends again and the oxybutynin makes her urine smell strong. Pt asked if other options? Please advise

## 2014-04-23 NOTE — Telephone Encounter (Signed)
Could try detrol LA please escribe Detrol LA 4 mg, one daily #30 with 6 refills, caution her about dry mouth and constipation, be sure to drink plenty of fluids

## 2014-04-23 NOTE — Telephone Encounter (Signed)
Pt informed with the below note. 

## 2014-04-26 LAB — CYTOLOGY - PAP

## 2014-04-30 ENCOUNTER — Emergency Department: Payer: Self-pay | Admitting: Emergency Medicine

## 2014-04-30 ENCOUNTER — Emergency Department: Payer: Self-pay | Admitting: Student

## 2014-05-07 ENCOUNTER — Telehealth: Payer: Self-pay | Admitting: *Deleted

## 2014-05-07 MED ORDER — FLUCONAZOLE 150 MG PO TABS: 150.0000 mg | ORAL_TABLET | Freq: Once | ORAL | Status: DC

## 2014-05-07 NOTE — Telephone Encounter (Signed)
Pt called c/o bad yeast infection, itching and white discharge only, requesting Rx please advise

## 2014-05-07 NOTE — Telephone Encounter (Signed)
Pt informed, rx sent 

## 2014-05-07 NOTE — Telephone Encounter (Signed)
Diflucan 150 by mouth x1 dose, office visit if no relief

## 2014-05-12 LAB — CBC
HCT: 41.5 % (ref 35.0–47.0)
HGB: 13.4 g/dL (ref 12.0–16.0)
MCH: 27.9 pg (ref 26.0–34.0)
MCHC: 32.2 g/dL (ref 32.0–36.0)
MCV: 87 fL (ref 80–100)
Platelet: 251 10*3/uL (ref 150–440)
RBC: 4.79 10*6/uL (ref 3.80–5.20)
RDW: 14.3 % (ref 11.5–14.5)
WBC: 7.1 10*3/uL (ref 3.6–11.0)

## 2014-05-12 LAB — URINALYSIS, COMPLETE
Bilirubin,UR: NEGATIVE
Blood: NEGATIVE
Glucose,UR: NEGATIVE mg/dL (ref 0–75)
Nitrite: NEGATIVE
PH: 6 (ref 4.5–8.0)
Protein: 30
Specific Gravity: 1.033 (ref 1.003–1.030)
WBC UR: 16 /HPF (ref 0–5)

## 2014-05-12 LAB — DRUG SCREEN, URINE
Amphetamines, Ur Screen: NEGATIVE (ref ?–1000)
Barbiturates, Ur Screen: NEGATIVE (ref ?–200)
Benzodiazepine, Ur Scrn: NEGATIVE (ref ?–200)
CANNABINOID 50 NG, UR ~~LOC~~: NEGATIVE (ref ?–50)
COCAINE METABOLITE, UR ~~LOC~~: NEGATIVE (ref ?–300)
MDMA (Ecstasy)Ur Screen: NEGATIVE (ref ?–500)
Methadone, Ur Screen: NEGATIVE (ref ?–300)
Opiate, Ur Screen: NEGATIVE (ref ?–300)
Phencyclidine (PCP) Ur S: NEGATIVE (ref ?–25)
TRICYCLIC, UR SCREEN: NEGATIVE (ref ?–1000)

## 2014-05-12 LAB — COMPREHENSIVE METABOLIC PANEL
ALBUMIN: 3.3 g/dL — AB (ref 3.4–5.0)
AST: 17 U/L (ref 15–37)
Alkaline Phosphatase: 104 U/L
Anion Gap: 7 (ref 7–16)
BUN: 8 mg/dL (ref 7–18)
Bilirubin,Total: 0.4 mg/dL (ref 0.2–1.0)
CALCIUM: 8.1 mg/dL — AB (ref 8.5–10.1)
Chloride: 109 mmol/L — ABNORMAL HIGH (ref 98–107)
Co2: 28 mmol/L (ref 21–32)
Creatinine: 0.88 mg/dL (ref 0.60–1.30)
Glucose: 101 mg/dL — ABNORMAL HIGH (ref 65–99)
OSMOLALITY: 285 (ref 275–301)
Potassium: 3 mmol/L — ABNORMAL LOW (ref 3.5–5.1)
SGPT (ALT): 18 U/L
SODIUM: 144 mmol/L (ref 136–145)
Total Protein: 6.6 g/dL (ref 6.4–8.2)

## 2014-05-12 LAB — ETHANOL: Ethanol: 5 mg/dL

## 2014-05-12 LAB — ACETAMINOPHEN LEVEL: ACETAMINOPHEN: 3 ug/mL — AB

## 2014-05-12 LAB — SALICYLATE LEVEL: Salicylates, Serum: <1.7 mg/dL

## 2014-05-13 ENCOUNTER — Inpatient Hospital Stay: Payer: Self-pay | Admitting: Psychiatry

## 2014-05-15 LAB — TSH: THYROID STIMULATING HORM: 1.19 u[IU]/mL

## 2014-05-31 ENCOUNTER — Encounter: Payer: Self-pay | Admitting: Women's Health

## 2014-08-02 ENCOUNTER — Other Ambulatory Visit: Payer: Self-pay | Admitting: Women's Health
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
## 2014-08-04 ENCOUNTER — Other Ambulatory Visit: Payer: Self-pay | Admitting: Women's Health

## 2014-08-04 ENCOUNTER — Telehealth: Payer: Self-pay | Admitting: *Deleted

## 2014-08-04 DIAGNOSIS — B3731 Acute candidiasis of vulva and vagina: Secondary | ICD-10-CM

## 2014-08-04 DIAGNOSIS — N3281 Overactive bladder: Secondary | ICD-10-CM

## 2014-08-04 DIAGNOSIS — B373 Candidiasis of vulva and vagina: Secondary | ICD-10-CM

## 2014-08-04 MED ORDER — OXYBUTYNIN CHLORIDE ER 5 MG PO TB24: 5.0000 mg | ORAL_TABLET | Freq: Every day | ORAL | Status: DC

## 2014-08-04 MED ORDER — FLUCONAZOLE 100 MG PO TABS: ORAL_TABLET | ORAL | Status: DC

## 2014-08-04 NOTE — Telephone Encounter (Signed)
Pt calling requesting refill on oxybutynin and diflucan #30 pt said she has recurrent yeast and you gave her 30 pills last time. Annual in 04/22/14. Okay to refill both?

## 2014-08-04 NOTE — Telephone Encounter (Signed)
Telephone call, states mild external itching, will call in Diflucan refill, new partner, needs office visit for STD screen, instructed to schedule. History of overactive bladder with good relief with Detrol, Will call in refill.

## 2014-08-21 ENCOUNTER — Other Ambulatory Visit: Payer: Self-pay | Admitting: Women's Health
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
## 2014-11-20 NOTE — Consult Note (Signed)
Brief Consult Note: Diagnosis: major depression.   Patient was seen by consultant.   Consult note dictated.   Recommend further assessment or treatment.   Orders entered.   Discussed with Attending MD.   Comments: Psychiatry: PAtient seen. Chart reviewed. Note dictated. Patient to be admitted for depression with suicidal ideation. Orders done.  Electronic Signatures: Clapacs, Madie Reno (MD)  (Signed 15-Oct-15 13:05)  Authored: Brief Consult Note   Last Updated: 15-Oct-15 13:05 by Gonzella Lex (MD)
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
## 2014-12-24 ENCOUNTER — Ambulatory Visit: Payer: Commercial Managed Care - HMO | Admitting: Women's Health

## 2015-01-14 ENCOUNTER — Telehealth: Payer: Self-pay | Admitting: Physician Assistant

## 2015-01-14 ENCOUNTER — Ambulatory Visit: Payer: Self-pay | Admitting: Physician Assistant

## 2015-01-14 NOTE — Telephone Encounter (Signed)
Pt came in one hr late with a spider bite.  Spoke to [place] she Advised pt to go to the ER or urgent care.

## 2015-01-15 ENCOUNTER — Encounter: Payer: Self-pay | Admitting: Emergency Medicine

## 2015-01-15 DIAGNOSIS — W57XXXA Bitten or stung by nonvenomous insect and other nonvenomous arthropods, initial encounter: Secondary | ICD-10-CM | POA: Diagnosis not present

## 2015-01-15 DIAGNOSIS — S80262A Insect bite (nonvenomous), left knee, initial encounter: Secondary | ICD-10-CM | POA: Diagnosis not present

## 2015-01-15 DIAGNOSIS — Y9289 Other specified places as the place of occurrence of the external cause: Secondary | ICD-10-CM | POA: Insufficient documentation

## 2015-01-15 DIAGNOSIS — M549 Dorsalgia, unspecified: Secondary | ICD-10-CM | POA: Diagnosis not present

## 2015-01-15 DIAGNOSIS — S8992XA Unspecified injury of left lower leg, initial encounter: Secondary | ICD-10-CM | POA: Diagnosis not present

## 2015-01-15 DIAGNOSIS — Y9389 Activity, other specified: Secondary | ICD-10-CM | POA: Diagnosis not present

## 2015-01-15 DIAGNOSIS — Y998 Other external cause status: Secondary | ICD-10-CM | POA: Diagnosis not present

## 2015-01-15 DIAGNOSIS — G8929 Other chronic pain: Secondary | ICD-10-CM | POA: Insufficient documentation

## 2015-01-15 NOTE — ED Notes (Addendum)
Pt c/o chronic back pain for about 1 year; was seeing Dr Primus Bravo for injections but has not been in almost a year; over the last few months she's fallen several times-last fall was over a week ago; pt adds she has wounds to her left leg from falls that are taking some time to heal; also says she has intermittent cyanosis to the toes on both feet-that has been happening since November of last year; pt says she came tonight because the "spider bite" just below her left knee is getting worse;

## 2015-01-16 ENCOUNTER — Emergency Department
Admission: EM | Admit: 2015-01-16 | Discharge: 2015-01-16 | Discharge status: Against Medical Advice | Payer: Medicare Other | Attending: Emergency Medicine | Admitting: Emergency Medicine

## 2015-04-14 ENCOUNTER — Encounter: Payer: Self-pay | Admitting: Family Medicine

## 2015-04-14 ENCOUNTER — Ambulatory Visit (INDEPENDENT_AMBULATORY_CARE_PROVIDER_SITE_OTHER): Payer: 59 | Admitting: Family Medicine

## 2015-04-14 VITALS — BP 118/90 | HR 73 | Temp 98.5°F | Resp 16 | Ht 67.0 in | Wt 246.8 lb

## 2015-04-14 DIAGNOSIS — M940 Chondrocostal junction syndrome [Tietze]: Secondary | ICD-10-CM | POA: Insufficient documentation

## 2015-04-14 DIAGNOSIS — R350 Frequency of micturition: Secondary | ICD-10-CM | POA: Diagnosis not present

## 2015-04-14 DIAGNOSIS — R0600 Dyspnea, unspecified: Secondary | ICD-10-CM | POA: Insufficient documentation

## 2015-04-14 DIAGNOSIS — L299 Pruritus, unspecified: Secondary | ICD-10-CM | POA: Diagnosis not present

## 2015-04-14 DIAGNOSIS — R3915 Urgency of urination: Secondary | ICD-10-CM | POA: Diagnosis not present

## 2015-04-14 DIAGNOSIS — R609 Edema, unspecified: Secondary | ICD-10-CM

## 2015-04-14 LAB — POCT URINALYSIS DIPSTICK
Bilirubin, UA: NEGATIVE
Glucose, UA: NEGATIVE
Ketones, UA: NEGATIVE
LEUKOCYTES UA: NEGATIVE
NITRITE UA: NEGATIVE
PH UA: 8
Protein, UA: NEGATIVE
RBC UA: NEGATIVE
Spec Grav, UA: 1.01
UROBILINOGEN UA: 1

## 2015-04-14 NOTE — Patient Instructions (Signed)
We will call you with the lab results. 

## 2015-04-14 NOTE — Progress Notes (Signed)
Subjective:     Patient ID: Barbara Anderson, female   DOB: 1970/12/21, 44 y.o.   MRN: [id number]  HPI  Chief Complaint  Patient presents with  . Chest Pain    Patient comes in office today with complaints of chest pain and shortness of breath for the past 5-6 months. Patient describes pain as sharp and reports that she is having a hard time moving her left arm. Patient reports pain in the chest is on the left side and denies radiation.   . Edema    Patient reports that she has swelling of her upper and lower extremities for the past 2 yrs, she denies history of hypertenstion or being on any new medication  . Skin Problem    Patient has concerns of itching over her entire body for the past 4 months she states that it becomes uncontrollable. Patient would also like to discuss skin discoloration she states her complexion is darker now.  . Urinary Frequency    Patient would like to address urinary frequency an incontience for the past 6 months and foul smelling urine for the past 2 months. Patient has conccerns of possible kidney diease  Reports her mother just died over the weekend and was found to have high potassium fueling patient's concern about her own kidneys. States her father died last year and they had to place her mom in [place] home.Reports that she also just received final divorce papers. Has been staying in shelters or in her car. She is followed by Dr. Toy Care and states she will be making an appointment soon   Review of Systems  Genitourinary:       See Dr. Elon Alas in [place] for gyn.  Musculoskeletal:       Chronic back pain: has seen pain management, Dr. Primus Bravo, in the past. Ambulates with a single point cane. Localizes her chest pain to left upper pectoralis area. Notices it first thing in the AM and with ambulation.       Objective:   Physical Exam  Constitutional: She appears well-developed and well-nourished. No distress.  Cardiovascular: Normal rate and  regular rhythm.   Pulmonary/Chest: Effort normal and breath sounds normal.  Musculoskeletal: She exhibits no edema (no pitting edema of her lower extremities).  Tender in her left upper costochondral area       Assessment:    1. Urinary frequency - POCT urinalysis dipstick  2. Urinary urgency - POCT urinalysis dipstick  3. Edema - Comprehensive metabolic panel - T4, free - TSH  4. Itching: labs as above; may be stress equivalent  5. Costochondritis  6. Dyspnea - CBC with Differential/Platelet    Plan:    Further f/u pending lab work. Encouraged f/u with Dr.Kaur. Consider diuretic trial and/or nsaid's if kidney function normal.

## 2015-04-15 ENCOUNTER — Telehealth: Payer: Self-pay

## 2015-04-15 LAB — COMPREHENSIVE METABOLIC PANEL
A/G RATIO: 1.6 (ref 1.1–2.5)
ALK PHOS: 89 IU/L (ref 39–117)
ALT: 10 IU/L (ref 0–32)
AST: 14 IU/L (ref 0–40)
Albumin: 4.1 g/dL (ref 3.5–5.5)
BUN/Creatinine Ratio: 12 (ref 9–23)
BUN: 13 mg/dL (ref 6–24)
Bilirubin Total: <0.2 mg/dL (ref 0.0–1.2)
CALCIUM: 8.8 mg/dL (ref 8.7–10.2)
CO2: 26 mmol/L (ref 18–29)
Chloride: 103 mmol/L (ref 97–108)
Creatinine, Ser: 1.12 mg/dL — ABNORMAL HIGH (ref 0.57–1.00)
GFR calc Af Amer: 69 mL/min/{1.73_m2} (ref >59)
GFR, EST NON AFRICAN AMERICAN: 59 mL/min/{1.73_m2} — AB (ref >59)
GLOBULIN, TOTAL: 2.5 g/dL (ref 1.5–4.5)
Glucose: 92 mg/dL (ref 65–99)
POTASSIUM: 4.1 mmol/L (ref 3.5–5.2)
SODIUM: 140 mmol/L (ref 134–144)
Total Protein: 6.6 g/dL (ref 6.0–8.5)

## 2015-04-15 LAB — CBC WITH DIFFERENTIAL/PLATELET
BASOS ABS: 0 10*3/uL (ref 0.0–0.2)
BASOS: 0 %
EOS (ABSOLUTE): 0.3 10*3/uL (ref 0.0–0.4)
Eos: 4 %
HEMATOCRIT: 38.6 % (ref 34.0–46.6)
HEMOGLOBIN: 12.3 g/dL (ref 11.1–15.9)
Immature Grans (Abs): 0 10*3/uL (ref 0.0–0.1)
Immature Granulocytes: 0 %
LYMPHS ABS: 2 10*3/uL (ref 0.7–3.1)
Lymphs: 29 %
MCH: 28.9 pg (ref 26.6–33.0)
MCHC: 31.9 g/dL (ref 31.5–35.7)
MCV: 91 fL (ref 79–97)
MONOCYTES: 8 %
Monocytes Absolute: 0.5 10*3/uL (ref 0.1–0.9)
NEUTROS ABS: 4.1 10*3/uL (ref 1.4–7.0)
Neutrophils: 59 %
Platelets: 204 10*3/uL (ref 150–379)
RBC: 4.26 x10E6/uL (ref 3.77–5.28)
RDW: 14.1 % (ref 12.3–15.4)
WBC: 6.9 10*3/uL (ref 3.4–10.8)

## 2015-04-15 LAB — TSH: TSH: 2.24 u[IU]/mL (ref 0.450–4.500)

## 2015-04-15 LAB — T4, FREE: Free T4: 0.77 ng/dL — ABNORMAL LOW (ref 0.82–1.77)

## 2015-04-15 NOTE — Telephone Encounter (Signed)
Patient has been advised she would like to know what medication you recommend for inflammation?

## 2015-04-15 NOTE — Telephone Encounter (Signed)
-----   Message from Carmon Ginsberg, [place] sent at 04/15/2015  7:57 AM EDT ----- Labs look pretty good: no anemia or sign of infection. Very mild changes in kidney function (we can check this again in  3 months) with normal potassium; thyroid hormone with very mild decrease ( also repeat later). I would try two Aleve twice daily with food for your chest discomfort and do follow up with Dr. Toy Care.

## 2015-04-15 NOTE — Telephone Encounter (Signed)
Unable to reach patient at this time, page sent to her phone. KW

## 2015-04-15 NOTE — Telephone Encounter (Signed)
I have suggested Aleve as written in my phone message

## 2015-04-18 NOTE — Telephone Encounter (Signed)
Patient has been advised. KW 

## 2015-06-11 ENCOUNTER — Other Ambulatory Visit: Payer: Self-pay | Admitting: Women's Health

## 2015-08-04 ENCOUNTER — Other Ambulatory Visit: Payer: Self-pay | Admitting: Women's Health

## 2015-08-30 ENCOUNTER — Encounter: Payer: Self-pay | Admitting: Gynecology

## 2015-08-30 ENCOUNTER — Ambulatory Visit (INDEPENDENT_AMBULATORY_CARE_PROVIDER_SITE_OTHER): Payer: 59 | Admitting: Gynecology

## 2015-08-30 VITALS — BP 128/86

## 2015-08-30 DIAGNOSIS — B3731 Acute candidiasis of vulva and vagina: Secondary | ICD-10-CM

## 2015-08-30 DIAGNOSIS — N915 Oligomenorrhea, unspecified: Secondary | ICD-10-CM | POA: Diagnosis not present

## 2015-08-30 DIAGNOSIS — E663 Overweight: Secondary | ICD-10-CM | POA: Diagnosis not present

## 2015-08-30 DIAGNOSIS — N898 Other specified noninflammatory disorders of vagina: Secondary | ICD-10-CM

## 2015-08-30 DIAGNOSIS — B373 Candidiasis of vulva and vagina: Secondary | ICD-10-CM | POA: Diagnosis not present

## 2015-08-30 DIAGNOSIS — N760 Acute vaginitis: Secondary | ICD-10-CM

## 2015-08-30 DIAGNOSIS — A499 Bacterial infection, unspecified: Secondary | ICD-10-CM | POA: Diagnosis not present

## 2015-08-30 DIAGNOSIS — N951 Menopausal and female climacteric states: Secondary | ICD-10-CM | POA: Diagnosis not present

## 2015-08-30 DIAGNOSIS — R635 Abnormal weight gain: Secondary | ICD-10-CM | POA: Diagnosis not present

## 2015-08-30 DIAGNOSIS — B9689 Other specified bacterial agents as the cause of diseases classified elsewhere: Secondary | ICD-10-CM

## 2015-08-30 DIAGNOSIS — Z113 Encounter for screening for infections with a predominantly sexual mode of transmission: Secondary | ICD-10-CM | POA: Diagnosis not present

## 2015-08-30 LAB — WET PREP FOR TRICH, YEAST, CLUE
Clue Cells Wet Prep HPF POC: NONE SEEN
TRICH WET PREP: NONE SEEN

## 2015-08-30 LAB — TSH: TSH: 0.69 u[IU]/mL (ref 0.350–4.500)

## 2015-08-30 LAB — PREGNANCY, URINE: PREG TEST UR: NEGATIVE

## 2015-08-30 MED ORDER — TINIDAZOLE 500 MG PO TABS: ORAL_TABLET | ORAL | Status: DC

## 2015-08-30 MED ORDER — FLUCONAZOLE 150 MG PO TABS: 150.0000 mg | ORAL_TABLET | Freq: Once | ORAL | Status: DC

## 2015-08-30 MED ORDER — MEDROXYPROGESTERONE ACETATE 10 MG PO TABS: ORAL_TABLET | ORAL | Status: DC

## 2015-08-30 NOTE — Progress Notes (Signed)
   Patient is a 45 year old that presented to the office today complaining of vaginal discharge with odor as well as vulvar pruritus. Patient had informed me that in June 2014 she had a Mirena IUD removed. And she had been amenorrheic. Her [place] in 2014 was 6. She is also informed that she did not have a cycle almost the entire 2016 and had a cycle started in January of this year which she is finishing. She denied any nipple discharge any unusual headaches but occasional visual disturbances were reported. She has complained of weight gain as well. Patient interested in having an STD today.  Exam: Bartholin urethra Skene was within normal limits Vagina clear fishy odor-like discharge Cervix: No gross lesions on inspection Bimanual exam not done Adnexa: Pelvic exam not done Rectal exam: Not done  Urine precipitous was negative  Wet prep: Moderate yeast, many white blood cells, moderate bacteria  Assessment/plan: #1 overweight may be contributing to her oligomenorrhea. We discussed the concerns and risk of endometrial hyperplasia and ovarian cancer where going such a long time without menses. Since her urine pregnancy test was negative today she was prescribed Provera 10 mg to take 1 by mouth daily for 10 days of each month if she does not have a spontaneous menses every 35 days. She was reminded to do a home urine pregnancy test before taking the Provera if she does not use any form of contraception to make sure she's not pregnant before taking the medication. #2 as a result of her oligomenorrhea where going to check today and FSH, TSH, and prolactin. #3 for her yeast vaginitis she'll be prescribed Diflucan 150 mg 1 by mouth today #4 for bacterial vaginosis she'll be prescribed Tindamax 500 mg tablets. She'll take 4 tablets today and 4 tablets tomorrow #5 STD screen: GC and Chlamydia culture, HIV, RPR, hepatitis B and C all obtained today result pending at time of this dictation #6 patient reminded  to make her annual exam in the next few months.

## 2015-08-30 NOTE — Patient Instructions (Signed)
Fluconazole tablets What is this medicine? FLUCONAZOLE (floo KON na zole) is an antifungal medicine. It is used to treat certain kinds of fungal or yeast infections. This medicine may be used for other purposes; ask your health care provider or pharmacist if you have questions. What should I tell my health care provider before I take this medicine? They need to know if you have any of these conditions: -electrolyte abnormalities -history of irregular heart beat -kidney disease -an unusual or allergic reaction to fluconazole, other azole antifungals, medicines, foods, dyes, or preservatives -pregnant or trying to get pregnant -breast-feeding How should I use this medicine? Take this medicine by mouth. Follow the directions on the prescription label. Do not take your medicine more often than directed. Talk to your pediatrician regarding the use of this medicine in children. Special care may be needed. This medicine has been used in children as young as 58 months of age. Overdosage: If you think you have taken too much of this medicine contact a poison control center or emergency room at once. NOTE: This medicine is only for you. Do not share this medicine with others. What if I miss a dose? If you miss a dose, take it as soon as you can. If it is almost time for your next dose, take only that dose. Do not take double or extra doses. What may interact with this medicine? Do not take this medicine with any of the following medications: -astemizole -certain medicines for irregular heart beat like dofetilide, dronedarone, quinidine -cisapride -erythromycin -lomitapide -other medicines that prolong the QT interval (cause an abnormal heart rhythm) -pimozide -terfenadine -thioridazine -tolvaptan -ziprasidone This medicine may also interact with the following medications: -antiviral medicines for HIV or AIDS -birth control pills -certain antibiotics like rifabutin, rifampin -certain  medicines for blood pressure like amlodipine, isradipine, felodipine, hydrochlorothiazide, losartan, nifedipine -certain medicines for cancer like cyclophosphamide, vinblastine, vincristine -certain medicines for cholesterol like atorvastatin, lovastatin, fluvastatin, simvastatin -certain medicines for depression, anxiety, or psychotic disturbances like amitriptyline, midazolam, nortriptyline, triazolam -certain medicines for diabetes like glipizide, glyburide, tolbutamide -certain medicines for pain like alfentanil, fentanyl, methadone -certain medicines for seizures like carbamazepine, phenytoin -certain medicines that treat or prevent blood clots like warfarin -halofantrine -medicines that lower your chance of fighting infection like cyclosporine, prednisone, tacrolimus -NSAIDS, medicines for pain and inflammation, like celecoxib, diclofenac, flurbiprofen, ibuprofen, meloxicam, naproxen -other medicines for fungal infections -sirolimus -theophylline -tofacitinib This list may not describe all possible interactions. Give your health care provider a list of all the medicines, herbs, non-prescription drugs, or dietary supplements you use. Also tell them if you smoke, drink alcohol, or use illegal drugs. Some items may interact with your medicine. What should I watch for while using this medicine? Visit your doctor or health care professional for regular checkups. If you are taking this medicine for a long time you may need blood work. Tell your doctor if your symptoms do not improve. Some fungal infections need many weeks or months of treatment to cure. Alcohol can increase possible damage to your liver. Avoid alcoholic drinks. If you have a vaginal infection, do not have sex until you have finished your treatment. You can wear a sanitary napkin. Do not use tampons. Wear freshly washed cotton, not synthetic, panties. What side effects may I notice from receiving this medicine? Side effects that  you should report to your doctor or health care professional as soon as possible: -allergic reactions like skin rash or itching, hives, swelling of the lips, mouth,  tongue, or throat -dark urine -feeling dizzy or faint -irregular heartbeat or chest pain -redness, blistering, peeling or loosening of the skin, including inside the mouth -trouble breathing -unusual bruising or bleeding -vomiting -yellowing of the eyes or skin Side effects that usually do not require medical attention (report to your doctor or health care professional if they continue or are bothersome): -changes in how food tastes -diarrhea -headache -stomach upset or nausea This list may not describe all possible side effects. Call your doctor for medical advice about side effects. You may report side effects to FDA at 1-800-FDA-1088. Where should I keep my medicine? Keep out of the reach of children. Store at room temperature below 30 degrees C (86 degrees F). Throw away any medicine after the expiration date. NOTE: This sheet is a summary. It may not cover all possible information. If you have questions about this medicine, talk to your doctor, pharmacist, or health care provider.    2016, Elsevier/Gold Standard. (2013-02-21 16:13:04) Tinidazole tablets What is this medicine? TINIDAZOLE (tye NI da zole) is an antiinfective. It is used to treat amebiasis, giardiasis, trichomoniasis, and vaginosis. It will not work for colds, flu, or other viral infections. This medicine may be used for other purposes; ask your health care provider or pharmacist if you have questions. What should I tell my health care provider before I take this medicine? They need to know if you have any of these conditions: -anemia or other blood disorders -if you frequently drink alcohol containing drinks -receiving hemodialysis -seizure disorder -an unusual or allergic reaction to tinidazole, other medicines, foods, dyes, or  preservatives -pregnant or trying to get pregnant -breast-feeding How should I use this medicine? Take this medicine by mouth with a full glass of water. Follow the directions on the prescription label. Take with food. Take your medicine at regular intervals. Do not take your medicine more often than directed. Take all of your medicine as directed even if you think you are better. Do not skip doses or stop your medicine early. Talk to your pediatrician regarding the use of this medicine in children. While this drug may be prescribed for children as young as 78 years of age for selected conditions, precautions do apply. Overdosage: If you think you have taken too much of this medicine contact a poison control center or emergency room at once. NOTE: This medicine is only for you. Do not share this medicine with others. What if I miss a dose? If you miss a dose, take it as soon as you can. If it is almost time for your next dose, take only that dose. Do not take double or extra doses. What may interact with this medicine? Do not take this medicine with any of the following medications: -alcohol or any product that contains alcohol -amprenavir oral solution -disulfiram -paclitaxel injection -ritonavir oral solution -sertraline oral solution -sulfamethoxazole-trimethoprim injection This medicine may also interact with the following medications: -cholestyramine -cimetidine -conivaptan -cyclosporin -fluorouracil -fosphenytoin, phenytoin -ketoconazole -lithium -phenobarbital -tacrolimus -warfarin This list may not describe all possible interactions. Give your health care provider a list of all the medicines, herbs, non-prescription drugs, or dietary supplements you use. Also tell them if you smoke, drink alcohol, or use illegal drugs. Some items may interact with your medicine. What should I watch for while using this medicine? Tell your doctor or health care professional if your symptoms do  not improve or if they get worse. Avoid alcoholic drinks while you are taking this medicine and for  three days afterward. Alcohol may make you feel dizzy, sick, or flushed. If you are being treated for a sexually transmitted disease, avoid sexual contact until you have finished your treatment. Your sexual partner may also need treatment. What side effects may I notice from receiving this medicine? Side effects that you should report to your doctor or health care professional as soon as possible: -allergic reactions like skin rash, itching or hives, swelling of the face, lips, or tongue -breathing problems -confusion, depression -dark or white patches in the mouth -feeling faint or lightheaded, falls -fever, infection -numbness, tingling, pain or weakness in the hands or feet -pain when passing urine -seizures -unusually weak or tired -vaginal irritation or discharge -vomiting Side effects that usually do not require medical attention (report to your doctor or health care professional if they continue or are bothersome): -dark brown or reddish urine -diarrhea -headache -loss of appetite -metallic taste -nausea -stomach upset This list may not describe all possible side effects. Call your doctor for medical advice about side effects. You may report side effects to FDA at 1-800-FDA-1088. Where should I keep my medicine? Keep out of the reach of children. Store at room temperature between 15 and 30 degrees C (59 and 86 degrees F). Protect from light and moisture. Keep container tightly closed. Throw away any unused medicine after the expiration date. NOTE: This sheet is a summary. It may not cover all possible information. If you have questions about this medicine, talk to your doctor, pharmacist, or health care provider.    2016, Elsevier/Gold Standard. (2008-04-12 15:22:28) Bacterial Vaginosis Bacterial vaginosis is a vaginal infection that occurs when the normal balance of bacteria  in the vagina is disrupted. It results from an overgrowth of certain bacteria. This is the most common vaginal infection in women of childbearing age. Treatment is important to prevent complications, especially in pregnant women, as it can cause a premature delivery. CAUSES  Bacterial vaginosis is caused by an increase in harmful bacteria that are normally present in smaller amounts in the vagina. Several different kinds of bacteria can cause bacterial vaginosis. However, the reason that the condition develops is not fully understood. RISK FACTORS Certain activities or behaviors can put you at an increased risk of developing bacterial vaginosis, including:  Having a new sex partner or multiple sex partners.  Douching.  Using an intrauterine device (IUD) for contraception. Women do not get bacterial vaginosis from toilet seats, bedding, swimming pools, or contact with objects around them. SIGNS AND SYMPTOMS  Some women with bacterial vaginosis have no signs or symptoms. Common symptoms include:  Grey vaginal discharge.  A fishlike odor with discharge, especially after sexual intercourse.  Itching or burning of the vagina and vulva.  Burning or pain with urination. DIAGNOSIS  Your health care provider will take a medical history and examine the vagina for signs of bacterial vaginosis. A sample of vaginal fluid may be taken. Your health care provider will look at this sample under a microscope to check for bacteria and abnormal cells. A vaginal pH test may also be done.  TREATMENT  Bacterial vaginosis may be treated with antibiotic medicines. These may be given in the form of a pill or a vaginal cream. A second round of antibiotics may be prescribed if the condition comes back after treatment. Because bacterial vaginosis increases your risk for sexually transmitted diseases, getting treated can help reduce your risk for chlamydia, gonorrhea, HIV, and herpes. HOME CARE INSTRUCTIONS   Only  take over-the-counter  or prescription medicines as directed by your health care provider.  If antibiotic medicine was prescribed, take it as directed. Make sure you finish it even if you start to feel better.  Tell all sexual partners that you have a vaginal infection. They should see their health care provider and be treated if they have problems, such as a mild rash or itching.  During treatment, it is important that you follow these instructions:  Avoid sexual activity or use condoms correctly.  Do not douche.  Avoid alcohol as directed by your health care provider.  Avoid breastfeeding as directed by your health care provider. SEEK MEDICAL CARE IF:   Your symptoms are not improving after 3 days of treatment.  You have increased discharge or pain.  You have a fever. MAKE SURE YOU:   Understand these instructions.  Will watch your condition.  Will get help right away if you are not doing well or get worse. FOR MORE INFORMATION  Centers for Disease Control and Prevention, Division of STD Prevention: Appraiser[employer]raud[employer] American Sexual Health Association (ASHA): www.ashastd.org    This information is not intended to replace advice given to you by your health care provider. Make sure you discuss any questions you have with your health care provider.   Document Released: 07/16/2005 Document Revised: 08/06/2014 Document Reviewed: 02/25/2013 Elsevier Interactive Patient Education 2016 Elsevier Inc. Monilial Vaginitis Vaginitis in a soreness, swelling and redness (inflammation) of the vagina and vulva. Monilial vaginitis is not a sexually transmitted infection. CAUSES  Yeast vaginitis is caused by yeast (candida) that is normally found in your vagina. With a yeast infection, the candida has overgrown in number to a point that upsets the chemical balance. SYMPTOMS   White, thick vaginal discharge.  Swelling, itching, redness and irritation of the vagina and possibly the lips of  the vagina (vulva).  Burning or painful urination.  Painful intercourse. DIAGNOSIS  Things that may contribute to monilial vaginitis are:  Postmenopausal and virginal states.  Pregnancy.  Infections.  Being tired, sick or stressed, especially if you had monilial vaginitis in the past.  Diabetes. Good control will help lower the chance.  Birth control pills.  Tight fitting garments.  Using bubble bath, feminine sprays, douches or deodorant tampons.  Taking certain medications that kill germs (antibiotics).  Sporadic recurrence can occur if you become ill. TREATMENT  Your caregiver will give you medication.  There are several kinds of anti monilial vaginal creams and suppositories specific for monilial vaginitis. For recurrent yeast infections, use a suppository or cream in the vagina 2 times a week, or as directed.  Anti-monilial or steroid cream for the itching or irritation of the vulva may also be used. Get your caregiver's permission.  Painting the vagina with methylene blue solution may help if the monilial cream does not work.  Eating yogurt may help prevent monilial vaginitis. HOME CARE INSTRUCTIONS   Finish all medication as prescribed.  Do not have sex until treatment is completed or after your caregiver tells you it is okay.  Take warm sitz baths.  Do not douche.  Do not use tampons, especially scented ones.  Wear cotton underwear.  Avoid tight pants and panty hose.  Tell your sexual partner that you have a yeast infection. They should go to their caregiver if they have symptoms such as mild rash or itching.  Your sexual partner should be treated as well if your infection is difficult to eliminate.  Practice safer sex. Use condoms.  Some vaginal medications  cause latex condoms to fail. Vaginal medications that harm condoms are:  Cleocin cream.  Butoconazole (Femstat).  Terconazole (Terazol) vaginal suppository.  Miconazole (Monistat) (may  be purchased over the counter). SEEK MEDICAL CARE IF:   You have a temperature by mouth above 102 F (38.9 C).  The infection is getting worse after 2 days of treatment.  The infection is not getting better after 3 days of treatment.  You develop blisters in or around your vagina.  You develop vaginal bleeding, and it is not your menstrual period.  You have pain when you urinate.  You develop intestinal problems.  You have pain with sexual intercourse.   This information is not intended to replace advice given to you by your health care provider. Make sure you discuss any questions you have with your health care provider.   Document Released: 04/25/2005 Document Revised: 10/08/2011 Document Reviewed: 01/17/2015 Elsevier Interactive Patient Education [employer].

## 2015-08-31 LAB — FOLLICLE STIMULATING HORMONE: FSH: 8.4 m[IU]/mL

## 2015-08-31 LAB — GC/CHLAMYDIA PROBE AMP
CT PROBE, AMP APTIMA: NOT DETECTED
GC PROBE AMP APTIMA: NOT DETECTED

## 2015-08-31 LAB — HEPATITIS B SURFACE ANTIGEN: Hepatitis B Surface Ag: NEGATIVE

## 2015-08-31 LAB — PROLACTIN: Prolactin: 9.5 ng/mL

## 2015-08-31 LAB — RPR

## 2015-08-31 LAB — HIV ANTIBODY (ROUTINE TESTING W REFLEX): HIV 1&2 Ab, 4th Generation: NONREACTIVE

## 2015-08-31 LAB — HEPATITIS C ANTIBODY: HCV AB: NEGATIVE

## 2015-09-07 ENCOUNTER — Telehealth: Payer: Self-pay | Admitting: *Deleted

## 2015-09-07 DIAGNOSIS — B3731 Acute candidiasis of vulva and vagina: Secondary | ICD-10-CM

## 2015-09-07 DIAGNOSIS — B373 Candidiasis of vulva and vagina: Secondary | ICD-10-CM

## 2015-09-07 MED ORDER — FLUCONAZOLE 150 MG PO TABS: 150.0000 mg | ORAL_TABLET | Freq: Once | ORAL | Status: DC

## 2015-09-07 NOTE — Telephone Encounter (Signed)
Pt called to follow up from [place] 08/30/15 completed Tindamax 500 mg tablets and diflucan tablets, feels better, but still has vaginal itching and irritation, asked if refill could be given? Please advise

## 2015-09-07 NOTE — Telephone Encounter (Signed)
Pt aware Rx sent,

## 2015-09-07 NOTE — Telephone Encounter (Signed)
Call in additional Diflucan 150 mg one by mouth

## 2015-09-14 ENCOUNTER — Other Ambulatory Visit: Payer: Self-pay | Admitting: Women's Health

## 2015-09-14 ENCOUNTER — Ambulatory Visit (INDEPENDENT_AMBULATORY_CARE_PROVIDER_SITE_OTHER): Payer: Commercial Managed Care - HMO

## 2015-09-14 ENCOUNTER — Encounter: Payer: Self-pay | Admitting: Women's Health

## 2015-09-14 ENCOUNTER — Ambulatory Visit (INDEPENDENT_AMBULATORY_CARE_PROVIDER_SITE_OTHER): Payer: Commercial Managed Care - HMO | Admitting: Women's Health

## 2015-09-14 VITALS — BP 128/80 | Ht 67.0 in | Wt 255.0 lb

## 2015-09-14 DIAGNOSIS — R8279 Other abnormal findings on microbiological examination of urine: Secondary | ICD-10-CM | POA: Diagnosis not present

## 2015-09-14 DIAGNOSIS — Z01419 Encounter for gynecological examination (general) (routine) without abnormal findings: Secondary | ICD-10-CM | POA: Diagnosis not present

## 2015-09-14 DIAGNOSIS — D252 Subserosal leiomyoma of uterus: Secondary | ICD-10-CM

## 2015-09-14 DIAGNOSIS — N831 Corpus luteum cyst of ovary, unspecified side: Secondary | ICD-10-CM | POA: Diagnosis not present

## 2015-09-14 DIAGNOSIS — R935 Abnormal findings on diagnostic imaging of other abdominal regions, including retroperitoneum: Secondary | ICD-10-CM

## 2015-09-14 DIAGNOSIS — N926 Irregular menstruation, unspecified: Secondary | ICD-10-CM

## 2015-09-14 NOTE — Progress Notes (Signed)
Barbara Anderson 12-31-1970 [id number]  History:    Presents for breast and pelvic exam. Continues to be amenorrheic or a 1 day cycle every 3-4 months. Has been amenorrheic since Mirena IUD was removed in 2014 with low FSH. No withdrawal after Provera. Normal Pap history, last mammogram 2007. Has had some problems with depression, anxiety, panic, fibromyalgia and chronic pain. History of a concussion at work which caused short-term memory loss. On Disability. Same partner. 07/2015 FSH 8, normal TSH and prolactin, negative STD screen.  Past medical history, past surgical history, family history and social history were all reviewed and documented in the EPIC chart. Parents deceased, has some contact with 1 sister. Has had a 25 pound weight gain in the past year, reports has had difficulty maintaining weight since death of parents. Numerous elective plastic surgeries: Abdominoplasty, breast implants, upper arm reduction, lipo-suction  ROS:  A ROS was performed and pertinent positives and negatives are included.  Exam:  Filed Vitals:   09/14/15 1358  BP: 128/80    General appearance:  Normal Thyroid:  Symmetrical, normal in size, without palpable masses or nodularity. Respiratory  Auscultation:  Clear without wheezing or rhonchi Cardiovascular  Auscultation:  Regular rate, without rubs, murmurs or gallops  Edema/varicosities:  Not grossly evident Abdominal  Soft,nontender, without masses, guarding or rebound.  Liver/spleen:  No organomegaly noted  Hernia:  None appreciated  Skin  Inspection:  Grossly normal   Breasts: Examined lying and sitting. Bilateral implants    Right: Without masses, retractions, discharge or axillary adenopathy.     Left: Without masses, retractions, discharge or axillary adenopathy. Gentitourinary   Inguinal/mons:  Normal without inguinal adenopathy  External genitalia:  Normal  BUS/Urethra/Skene's glands:  Normal  Vagina:  Normal  Cervix:   Normal  Uterus:   normal in size, shape and contour.  Midline and mobile-difficult to palpate weight in history of abdominoplasty Adnexa/parametria:     Rt: Without masses or tenderness.   Lt: Without masses or tenderness.  Anus and perineum: Normal  Digital rectal exam: Normal sphincter tone without palpated masses or tenderness  Assessment/Plan:  45 y.o. D WF G0 for breast and pelvic exam.  Irregular cycles/oligomenorrhea- one day spotting every 3-4 months Obesity Anxiety/depression/panic/chronic pain/fibromyalgia-primary care manages Reports questionable miscarriage 2 months ago.  Ultrasound: T/V anteverted uterus right posterior subserous fibroid 21 x 17 mm, right myometrium irregular cystic focal area with internal low level echoes 12 x 19 mm, negative CFD, . Right ovary thick-walled corpus luteal cyst positive CFD 21 x 14 mm. Left ovary normal. Negative cul-de-sac.  Plan: hCG, ABO Rh. Would like to conceive, reviewed hazards in relationship to health. Reviewed questionable adenomyosis versus degenerating fibroid versus retained products in the uterus, if negative hCG will need to proceed to sonohysterogram. Multivitamin daily encouraged. SBE's, reviewed importance of annual screening mammogram instructed to schedule. Reviewed importance of daily regular exercise start with 10 minutes walking daily encouraged. Encouraged to decrease carbohydrates and diet for weight loss. Follow-up with psychiatrist and primary care.      Huel Cote [facility], 2:54 PM 09/14/2015

## 2015-09-14 NOTE — Patient Instructions (Signed)
Health Maintenance, Female Adopting a healthy lifestyle and getting preventive care can go a long way to promote health and wellness. Talk with your health care provider about what schedule of regular examinations is right for you. This is a good chance for you to check in with your provider about disease prevention and staying healthy. In between checkups, there are plenty of things you can do on your own. Experts have done a lot of research about which lifestyle changes and preventive measures are most likely to keep you healthy. Ask your health care provider for more information. WEIGHT AND DIET  Eat a healthy diet  Be sure to include plenty of vegetables, fruits, low-fat dairy products, and lean protein.  Do not eat a lot of foods high in solid fats, added sugars, or salt.  Get regular exercise. This is one of the most important things you can do for your health.  Most adults should exercise for at least 150 minutes each week. The exercise should increase your heart rate and make you sweat (moderate-intensity exercise).  Most adults should also do strengthening exercises at least twice a week. This is in addition to the moderate-intensity exercise.  Maintain a healthy weight  Body mass index (BMI) is a measurement that can be used to identify possible weight problems. It estimates body fat based on height and weight. Your health care provider can help determine your BMI and help you achieve or maintain a healthy weight.  For females 20 years of age and older:   A BMI below 18.5 is considered underweight.  A BMI of 18.5 to 24.9 is normal.  A BMI of 25 to 29.9 is considered overweight.  A BMI of 30 and above is considered obese.  Watch levels of cholesterol and blood lipids  You should start having your blood tested for lipids and cholesterol at 47 years of age, then have this test every 5 years.  You may need to have your cholesterol levels checked more often if:  Your lipid  or cholesterol levels are high.  You are older than 47 years of age.  You are at high risk for heart disease.  CANCER SCREENING   Lung Cancer  Lung cancer screening is recommended for adults 55-80 years old who are at high risk for lung cancer because of a history of smoking.  A yearly low-dose CT scan of the lungs is recommended for people who:  Currently smoke.  Have quit within the past 15 years.  Have at least a 30-pack-year history of smoking. A pack year is smoking an average of one pack of cigarettes a day for 1 year.  Yearly screening should continue until it has been 15 years since you quit.  Yearly screening should stop if you develop a health problem that would prevent you from having lung cancer treatment.  Breast Cancer  Practice breast self-awareness. This means understanding how your breasts normally appear and feel.  It also means doing regular breast self-exams. Let your health care provider know about any changes, no matter how small.  If you are in your 20s or 30s, you should have a clinical breast exam (CBE) by a health care provider every 1-3 years as part of a regular health exam.  If you are 40 or older, have a CBE every year. Also consider having a breast X-ray (mammogram) every year.  If you have a family history of breast cancer, talk to your health care provider about genetic screening.  If you   are at high risk for breast cancer, talk to your health care provider about having an MRI and a mammogram every year.  Breast cancer gene (BRCA) assessment is recommended for women who have family members with BRCA-related cancers. BRCA-related cancers include:  Breast.  Ovarian.  Tubal.  Peritoneal cancers.  Results of the assessment will determine the need for genetic counseling and BRCA1 and BRCA2 testing. Cervical Cancer Your health care provider may recommend that you be screened regularly for cancer of the pelvic organs (ovaries, uterus, and  vagina). This screening involves a pelvic examination, including checking for microscopic changes to the surface of your cervix (Pap test). You may be encouraged to have this screening done every 3 years, beginning at age 21.  For women ages 30-65, health care providers may recommend pelvic exams and Pap testing every 3 years, or they may recommend the Pap and pelvic exam, combined with testing for human papilloma virus (HPV), every 5 years. Some types of HPV increase your risk of cervical cancer. Testing for HPV may also be done on women of any age with unclear Pap test results.  Other health care providers may not recommend any screening for nonpregnant women who are considered low risk for pelvic cancer and who do not have symptoms. Ask your health care provider if a screening pelvic exam is right for you.  If you have had past treatment for cervical cancer or a condition that could lead to cancer, you need Pap tests and screening for cancer for at least 20 years after your treatment. If Pap tests have been discontinued, your risk factors (such as having a new sexual partner) need to be reassessed to determine if screening should resume. Some women have medical problems that increase the chance of getting cervical cancer. In these cases, your health care provider may recommend more frequent screening and Pap tests. Colorectal Cancer  This type of cancer can be detected and often prevented.  Routine colorectal cancer screening usually begins at 47 years of age and continues through 47 years of age.  Your health care provider may recommend screening at an earlier age if you have risk factors for colon cancer.  Your health care provider may also recommend using home test kits to check for hidden blood in the stool.  A small camera at the end of a tube can be used to examine your colon directly (sigmoidoscopy or colonoscopy). This is done to check for the earliest forms of colorectal  cancer.  Routine screening usually begins at age 50.  Direct examination of the colon should be repeated every 5-10 years through 47 years of age. However, you may need to be screened more often if early forms of precancerous polyps or small growths are found. Skin Cancer  Check your skin from head to toe regularly.  Tell your health care provider about any new moles or changes in moles, especially if there is a change in a mole's shape or color.  Also tell your health care provider if you have a mole that is larger than the size of a pencil eraser.  Always use sunscreen. Apply sunscreen liberally and repeatedly throughout the day.  Protect yourself by wearing long sleeves, pants, a wide-brimmed hat, and sunglasses whenever you are outside. HEART DISEASE, DIABETES, AND HIGH BLOOD PRESSURE   High blood pressure causes heart disease and increases the risk of stroke. High blood pressure is more likely to develop in:  People who have blood pressure in the high end   of the normal range (130-139/85-89 mm Hg).  People who are overweight or obese.  People who are African American.  If you are 38-23 years of age, have your blood pressure checked every 3-5 years. If you are 61 years of age or older, have your blood pressure checked every year. You should have your blood pressure measured twice--once when you are at a hospital or clinic, and once when you are not at a hospital or clinic. Record the average of the two measurements. To check your blood pressure when you are not at a hospital or clinic, you can use:  An automated blood pressure machine at a pharmacy.  A home blood pressure monitor.  If you are between 45 years and 39 years old, ask your health care provider if you should take aspirin to prevent strokes.  Have regular diabetes screenings. This involves taking a blood sample to check your fasting blood sugar level.  If you are at a normal weight and have a low risk for diabetes,  have this test once every three years after 47 years of age.  If you are overweight and have a high risk for diabetes, consider being tested at a younger age or more often. PREVENTING INFECTION  Hepatitis B  If you have a higher risk for hepatitis B, you should be screened for this virus. You are considered at high risk for hepatitis B if:  You were born in a country where hepatitis B is common. Ask your health care provider which countries are considered high risk.  Your parents were born in a high-risk country, and you have not been immunized against hepatitis B (hepatitis B vaccine).  You have HIV or AIDS.  You use needles to inject street drugs.  You live with someone who has hepatitis B.  You have had sex with someone who has hepatitis B.  You get hemodialysis treatment.  You take certain medicines for conditions, including cancer, organ transplantation, and autoimmune conditions. Hepatitis C  Blood testing is recommended for:  Everyone born from [birth year] through 1967.  Anyone with known risk factors for hepatitis C. Sexually transmitted infections (STIs)  You should be screened for sexually transmitted infections (STIs) including gonorrhea and chlamydia if:  You are sexually active and are younger than 47 years of age.  You are older than 47 years of age and your health care provider tells you that you are at risk for this type of infection.  Your sexual activity has changed since you were last screened and you are at an increased risk for chlamydia or gonorrhea. Ask your health care provider if you are at risk.  If you do not have HIV, but are at risk, it may be recommended that you take a prescription medicine daily to prevent HIV infection. This is called pre-exposure prophylaxis (PrEP). You are considered at risk if:  You are sexually active and do not regularly use condoms or know the HIV status of your partner(s).  You take drugs by injection.  You are sexually  active with a partner who has HIV. Talk with your health care provider about whether you are at high risk of being infected with HIV. If you choose to begin PrEP, you should first be tested for HIV. You should then be tested every 3 months for as long as you are taking PrEP.  PREGNANCY   If you are premenopausal and you may become pregnant, ask your health care provider about preconception counseling.  If you may  become pregnant, take 400 to 800 micrograms (mcg) of folic acid every day.  If you want to prevent pregnancy, talk to your health care provider about birth control (contraception). OSTEOPOROSIS AND MENOPAUSE   Osteoporosis is a disease in which the bones lose minerals and strength with aging. This can result in serious bone fractures. Your risk for osteoporosis can be identified using a bone density scan.  If you are 61 years of age or older, or if you are at risk for osteoporosis and fractures, ask your health care provider if you should be screened.  Ask your health care provider whether you should take a calcium or vitamin D supplement to lower your risk for osteoporosis.  Menopause may have certain physical symptoms and risks.  Hormone replacement therapy may reduce some of these symptoms and risks. Talk to your health care provider about whether hormone replacement therapy is right for you.  HOME CARE INSTRUCTIONS   Schedule regular health, dental, and eye exams.  Stay current with your immunizations.   Do not use any tobacco products including cigarettes, chewing tobacco, or electronic cigarettes.  If you are pregnant, do not drink alcohol.  If you are breastfeeding, limit how much and how often you drink alcohol.  Limit alcohol intake to no more than 1 drink per day for nonpregnant women. One drink equals 12 ounces of beer, 5 ounces of wine, or 1 ounces of hard liquor.  Do not use street drugs.  Do not share needles.  Ask your health care provider for help if  you need support or information about quitting drugs.  Tell your health care provider if you often feel depressed.  Tell your health care provider if you have ever been abused or do not feel safe at home.   This information is not intended to replace advice given to you by your health care provider. Make sure you discuss any questions you have with your health care provider.   Document Released: 01/29/2011 Document Revised: 08/06/2014 Document Reviewed: 06/17/2013 Elsevier Interactive Patient Education [employer].

## 2015-09-15 LAB — URINALYSIS W MICROSCOPIC + REFLEX CULTURE
BACTERIA UA: NONE SEEN [HPF]
Bilirubin Urine: NEGATIVE
CASTS: NONE SEEN [LPF]
CRYSTALS: NONE SEEN [HPF]
Glucose, UA: NEGATIVE
HGB URINE DIPSTICK: NEGATIVE
KETONES UR: NEGATIVE
Leukocytes, UA: NEGATIVE
NITRITE: NEGATIVE
PH: 5.5 (ref 5.0–8.0)
PROTEIN: NEGATIVE
RBC / HPF: NONE SEEN RBC/HPF (ref <=2)
Specific Gravity, Urine: 1.025 (ref 1.001–1.035)
YEAST: NONE SEEN [HPF]

## 2015-09-15 LAB — ABO AND RH: RH TYPE: POSITIVE

## 2015-09-15 LAB — BETA HCG QUANT (REF LAB)

## 2015-09-16 LAB — URINE CULTURE

## 2015-09-17 LAB — HCG, QUANTITATIVE, PREGNANCY

## 2015-09-19 ENCOUNTER — Other Ambulatory Visit: Payer: Self-pay | Admitting: Gynecology

## 2015-09-19 ENCOUNTER — Telehealth: Payer: Self-pay | Admitting: Family Medicine

## 2015-09-19 DIAGNOSIS — F329 Major depressive disorder, single episode, unspecified: Secondary | ICD-10-CM

## 2015-09-19 DIAGNOSIS — F32A Depression, unspecified: Secondary | ICD-10-CM

## 2015-09-19 DIAGNOSIS — N926 Irregular menstruation, unspecified: Secondary | ICD-10-CM

## 2015-09-19 DIAGNOSIS — N939 Abnormal uterine and vaginal bleeding, unspecified: Secondary | ICD-10-CM

## 2015-09-19 NOTE — Telephone Encounter (Signed)
Added order and left message advising pt.   Thanks,   -Mickel Baas

## 2015-09-19 NOTE — Telephone Encounter (Signed)
Ok to refer. Thanks

## 2015-09-19 NOTE — Telephone Encounter (Signed)
Pt called needing to get a referral to another psychiatrist.  She has [employer] ins and needs to change doctors  Her call back is [phone]  Thanks teri

## 2015-09-27 ENCOUNTER — Telehealth: Payer: Self-pay | Admitting: Gynecology

## 2015-09-27 NOTE — Telephone Encounter (Signed)
09/27/15-Pt was advised she has a $45 copay for upcoming sonohysterogram per Sam at [facility]. [id number]

## 2015-10-05 ENCOUNTER — Ambulatory Visit: Payer: Commercial Managed Care - HMO | Admitting: Gynecology

## 2015-10-05 ENCOUNTER — Other Ambulatory Visit: Payer: Commercial Managed Care - HMO

## 2015-10-26 ENCOUNTER — Ambulatory Visit (INDEPENDENT_AMBULATORY_CARE_PROVIDER_SITE_OTHER): Payer: Commercial Managed Care - HMO | Admitting: Physician Assistant

## 2015-10-26 ENCOUNTER — Encounter: Payer: Self-pay | Admitting: Physician Assistant

## 2015-10-26 VITALS — BP 152/88 | HR 68 | Temp 98.3°F | Resp 16

## 2015-10-26 DIAGNOSIS — F411 Generalized anxiety disorder: Secondary | ICD-10-CM | POA: Diagnosis not present

## 2015-10-26 DIAGNOSIS — R233 Spontaneous ecchymoses: Secondary | ICD-10-CM | POA: Insufficient documentation

## 2015-10-26 DIAGNOSIS — R0601 Orthopnea: Secondary | ICD-10-CM

## 2015-10-26 DIAGNOSIS — F329 Major depressive disorder, single episode, unspecified: Secondary | ICD-10-CM

## 2015-10-26 DIAGNOSIS — R030 Elevated blood-pressure reading, without diagnosis of hypertension: Secondary | ICD-10-CM | POA: Diagnosis not present

## 2015-10-26 DIAGNOSIS — R609 Edema, unspecified: Secondary | ICD-10-CM

## 2015-10-26 DIAGNOSIS — F909 Attention-deficit hyperactivity disorder, unspecified type: Secondary | ICD-10-CM

## 2015-10-26 DIAGNOSIS — M797 Fibromyalgia: Secondary | ICD-10-CM | POA: Insufficient documentation

## 2015-10-26 DIAGNOSIS — R238 Other skin changes: Secondary | ICD-10-CM | POA: Insufficient documentation

## 2015-10-26 DIAGNOSIS — IMO0001 Reserved for inherently not codable concepts without codable children: Secondary | ICD-10-CM

## 2015-10-26 DIAGNOSIS — H8190 Unspecified disorder of vestibular function, unspecified ear: Secondary | ICD-10-CM | POA: Insufficient documentation

## 2015-10-26 DIAGNOSIS — M542 Cervicalgia: Secondary | ICD-10-CM | POA: Insufficient documentation

## 2015-10-26 DIAGNOSIS — F32A Depression, unspecified: Secondary | ICD-10-CM

## 2015-10-26 MED ORDER — HYDROCHLOROTHIAZIDE 12.5 MG PO CAPS: 12.5000 mg | ORAL_CAPSULE | Freq: Every day | ORAL | Status: DC

## 2015-10-26 NOTE — Patient Instructions (Signed)
Edema °Edema is an abnormal buildup of fluids. It is more common in your legs and thighs. Painless swelling of the feet and ankles is more likely as a person ages. It also is common in looser skin, like around your eyes. °HOME CARE  °· Keep the affected body part above the level of the heart while lying down. °· Do not sit still or stand for a long time. °· Do not put anything right under your knees when you lie down. °· Do not wear tight clothes on your upper legs. °· Exercise your legs to help the puffiness (swelling) go down. °· Wear elastic bandages or support stockings as told by your doctor. °· A low-salt diet may help lessen the puffiness. °· Only take medicine as told by your doctor. °GET HELP IF: °· Treatment is not working. °· You have heart, liver, or kidney disease and notice that your skin looks puffy or shiny. °· You have puffiness in your legs that does not get better when you raise your legs. °· You have sudden weight gain for no reason. °GET HELP RIGHT AWAY IF:  °· You have shortness of breath or chest pain. °· You cannot breathe when you lie down. °· You have pain, redness, or warmth in the areas that are puffy. °· You have heart, liver, or kidney disease and get edema all of a sudden. °· You have a fever and your symptoms get worse all of a sudden. °MAKE SURE YOU:  °· Understand these instructions. °· Will watch your condition. °· Will get help right away if you are not doing well or get worse. °  °This information is not intended to replace advice given to you by your health care provider. Make sure you discuss any questions you have with your health care provider. °  °Document Released: 01/02/2008 Document Revised: 07/21/2013 Document Reviewed: 05/08/2013 °Elsevier Interactive Patient Education ©2016 Elsevier Inc. ° °

## 2015-10-26 NOTE — Progress Notes (Signed)
Patient: Barbara Anderson Female    DOB: [birth year]   45 y.o.   MRN: [id number] Visit Date: 10/26/2015  Today's Provider: Mar Daring, PA-C   Chief Complaint  Patient presents with  . Edema   Subjective:    HPI  Patient is here today c/o swelling on and off of her feet and hands. Swelling is all over for the past month. In the past year she has been having swelling in the lower extremities of her arms and legs. She is having dizziness, chest pain sometimes,headache, blurry vision, on her toes she has numbness and tingling, and when they are very swollen her hands and feet get really cold. She reports she cannot bend her fingers and her hands feel very tight. She has a family history of heart disease. The new swelling in her stomach is also about a month and it hurts when she bends. Patient has Fibromyalgia.  She does also mention her father passed from kidney failure but also had CHF. She also mentions she had a niece that passed from CHF at the age of 27. She was severely overweight however. She questions her heart and kidneys as to why she is swelling so much.     No Known Allergies Previous Medications   ALPRAZOLAM (XANAX) 0.25 MG TABLET    Take 0.25 mg by mouth 2 (two) times daily.   AMPHETAMINE-DEXTROAMPHETAMINE (ADDERALL PO)    2 IN THE MORNING  AND 1 AT NIGHT   DULOXETINE (CYMBALTA) 20 MG CAPSULE    Take 20 mg by mouth daily.   FOLIC ACID PO    Take 1 tablet by mouth daily. Reported on 08/30/2015   MEDROXYPROGESTERONE (PROVERA) 10 MG TABLET    Take one tablet daily for 10 days every 30 days if no spontaneous menses and negative home UPT   OXYBUTYNIN (DITROPAN-XL) 5 MG 24 HR TABLET    Take 1 tablet (5 mg total) by mouth at bedtime.   TINIDAZOLE (TINDAMAX) 500 MG TABLET    Take four tablets today and four tablets tomorrow at the same time    Review of Systems  Constitutional: Negative.   Respiratory: Positive for chest tightness and shortness of breath.  Negative for wheezing.   Cardiovascular: Positive for chest pain and leg swelling. Negative for palpitations.  Gastrointestinal: Negative.   Endocrine: Negative.   Genitourinary: Negative.   Musculoskeletal: Positive for joint swelling and gait problem (walks with a cane). Negative for back pain.  Skin: Negative.   Psychiatric/Behavioral: Negative.     Social History  Substance Use Topics  . Smoking status: Never Smoker   . Smokeless tobacco: Never Used  . Alcohol Use: No   Objective:   BP 152/88 mmHg  Pulse 68  Temp(Src) 98.3 F (36.8 C) (Oral)  Resp 16  Wt   LMP 10/19/2015  Physical Exam  Constitutional: She appears well-developed and well-nourished. No distress.  Neck: Normal range of motion. Neck supple. No JVD present. No tracheal deviation present. No thyromegaly present.  Cardiovascular: Normal rate, regular rhythm, normal heart sounds and intact distal pulses.  Exam reveals no gallop and no friction rub.   No murmur heard. Pulmonary/Chest: Effort normal and breath sounds normal. No respiratory distress. She has no wheezes. She has no rales.  Musculoskeletal: She exhibits edema. She exhibits no tenderness.  1+ pitting edema in LE. UE seem swollen and she has trouble making a fist  Lymphadenopathy:    She has no cervical  adenopathy.  Skin: She is not diaphoretic.  Vitals reviewed.       Assessment & Plan:     1. Edema, unspecified type Ongoing issue. I will check labs as below and f/u pending results. I will also give HCTZ for BP control and as diuretic.  I will see her back in 4 weeks to see how she is doing and recheck BP. - CBC with Differential - Comprehensive Metabolic Panel (CMET) - hydrochlorothiazide (MICROZIDE) 12.5 MG capsule; Take 1 capsule (12.5 mg total) by mouth daily.  Dispense: 30 capsule; Refill: 1  2. Orthopnea Will check BNP due to swelling, orthopnea and family history. Will f/u pending results. - B Nat Peptide  3. Elevated BP Will give  HCTZ and recheck in 4 weeks. - hydrochlorothiazide (MICROZIDE) 12.5 MG capsule; Take 1 capsule (12.5 mg total) by mouth daily.  Dispense: 30 capsule; Refill: 1  4. Depression Diagnosis pulled for referral to psychiatry. Used to see Dr. Toy Care. - Ambulatory referral to Psychiatry  5. Attention deficit hyperactivity disorder (ADHD), unspecified ADHD type See above medical treatment plan for #4. - Ambulatory referral to Psychiatry  6. Generalized anxiety disorder See above medical treatment plan for #4. - Ambulatory referral to Psychiatry       Mar Daring, PA-C  [facility]

## 2015-10-27 ENCOUNTER — Telehealth: Payer: Self-pay

## 2015-10-27 LAB — CBC WITH DIFFERENTIAL/PLATELET
BASOS ABS: 0.1 10*3/uL (ref 0.0–0.2)
Basos: 1 %
EOS (ABSOLUTE): 0.3 10*3/uL (ref 0.0–0.4)
EOS: 3 %
HEMATOCRIT: 35.7 % (ref 34.0–46.6)
HEMOGLOBIN: 12.1 g/dL (ref 11.1–15.9)
IMMATURE GRANULOCYTES: 1 %
Immature Grans (Abs): 0 10*3/uL (ref 0.0–0.1)
LYMPHS ABS: 2.4 10*3/uL (ref 0.7–3.1)
LYMPHS: 27 %
MCH: 29.2 pg (ref 26.6–33.0)
MCHC: 33.9 g/dL (ref 31.5–35.7)
MCV: 86 fL (ref 79–97)
MONOCYTES: 8 %
Monocytes Absolute: 0.7 10*3/uL (ref 0.1–0.9)
NEUTROS PCT: 60 %
Neutrophils Absolute: 5.3 10*3/uL (ref 1.4–7.0)
Platelets: 308 10*3/uL (ref 150–379)
RBC: 4.15 x10E6/uL (ref 3.77–5.28)
RDW: 14.6 % (ref 12.3–15.4)
WBC: 8.8 10*3/uL (ref 3.4–10.8)

## 2015-10-27 LAB — COMPREHENSIVE METABOLIC PANEL
ALBUMIN: 4.3 g/dL (ref 3.5–5.5)
ALK PHOS: 96 IU/L (ref 39–117)
ALT: 19 IU/L (ref 0–32)
AST: 18 IU/L (ref 0–40)
Albumin/Globulin Ratio: 1.6 (ref 1.2–2.2)
BUN/Creatinine Ratio: 14 (ref 9–23)
BUN: 13 mg/dL (ref 6–24)
Bilirubin Total: 0.2 mg/dL (ref 0.0–1.2)
CALCIUM: 9.1 mg/dL (ref 8.7–10.2)
CO2: 21 mmol/L (ref 18–29)
CREATININE: 0.93 mg/dL (ref 0.57–1.00)
Chloride: 101 mmol/L (ref 96–106)
GFR calc Af Amer: 85 mL/min/{1.73_m2} (ref >59)
GFR, EST NON AFRICAN AMERICAN: 74 mL/min/{1.73_m2} (ref >59)
GLUCOSE: 103 mg/dL — AB (ref 65–99)
Globulin, Total: 2.7 g/dL (ref 1.5–4.5)
Potassium: 4.3 mmol/L (ref 3.5–5.2)
Sodium: 140 mmol/L (ref 134–144)
Total Protein: 7 g/dL (ref 6.0–8.5)

## 2015-10-27 LAB — BRAIN NATRIURETIC PEPTIDE: BNP: 17.7 pg/mL (ref 0.0–100.0)

## 2015-10-27 NOTE — Telephone Encounter (Signed)
-----   Message from Mar Daring, [place] sent at 10/27/2015 12:39 PM EDT ----- All labs are within normal limits and stable.  Kidney and liver function are both normal. Lab for heart failure is normal also. Thanks! -JB

## 2015-10-27 NOTE — Telephone Encounter (Signed)
Call got answer but them got disconnected.  Thanks,  -Verlin Uher

## 2015-10-31 NOTE — Telephone Encounter (Signed)
LMTCB

## 2015-11-01 NOTE — Telephone Encounter (Signed)
Patient advised as directed below. Patient scheduled her follow-up appointment April 26  Thanks,  -Joseline

## 2015-11-01 NOTE — Telephone Encounter (Signed)
Pt is returning call.  CB#[phone]

## 2015-11-16 ENCOUNTER — Ambulatory Visit: Payer: 59 | Admitting: Family Medicine

## 2015-11-23 ENCOUNTER — Encounter: Payer: Self-pay | Admitting: Physician Assistant

## 2015-11-23 ENCOUNTER — Ambulatory Visit (INDEPENDENT_AMBULATORY_CARE_PROVIDER_SITE_OTHER): Payer: Commercial Managed Care - HMO | Admitting: Physician Assistant

## 2015-11-23 VITALS — BP 128/72 | HR 86 | Temp 98.3°F | Resp 16 | Wt 274.2 lb

## 2015-11-23 DIAGNOSIS — R609 Edema, unspecified: Secondary | ICD-10-CM | POA: Diagnosis not present

## 2015-11-23 DIAGNOSIS — R41840 Attention and concentration deficit: Secondary | ICD-10-CM

## 2015-11-23 DIAGNOSIS — R5382 Chronic fatigue, unspecified: Secondary | ICD-10-CM | POA: Diagnosis not present

## 2015-11-23 DIAGNOSIS — F329 Major depressive disorder, single episode, unspecified: Secondary | ICD-10-CM

## 2015-11-23 DIAGNOSIS — R030 Elevated blood-pressure reading, without diagnosis of hypertension: Secondary | ICD-10-CM

## 2015-11-23 DIAGNOSIS — R0789 Other chest pain: Secondary | ICD-10-CM | POA: Diagnosis not present

## 2015-11-23 DIAGNOSIS — F32A Depression, unspecified: Secondary | ICD-10-CM

## 2015-11-23 DIAGNOSIS — IMO0001 Reserved for inherently not codable concepts without codable children: Secondary | ICD-10-CM

## 2015-11-23 MED ORDER — HYDROCHLOROTHIAZIDE 12.5 MG PO CAPS: 12.5000 mg | ORAL_CAPSULE | Freq: Two times a day (BID) | ORAL | Status: DC

## 2015-11-23 NOTE — Patient Instructions (Signed)

## 2015-11-23 NOTE — Progress Notes (Signed)
Patient: Barbara Anderson Female    DOB: [birth year]   45 y.o.   MRN: [id number] Visit Date: 11/23/2015  Today's Provider: Mar Daring, PA-C   Chief Complaint  Patient presents with  . Follow-up    Edema, BP   Subjective:    HPI  Barbara Anderson is here today to follow-up edema and elevated bloop pressure. She was prescribed HCTZ for BP control and as diuretic. Her BP on 03/29 was 152/88.She reports that the swelling still present. It does respond to the HCTZ in the morning but by mid-afternoon it starts to return. Blood pressure is 128/72 today. She has not been checking BP at home. She reports increasing fatigue, continued occasional chest pains, decreased concentration and increased depression symptoms. She has been out of her adderall for a while now.   She was previously seen by Dr. Toy Care in [place], but they quit taking her insurance and it was going to cost $160 each visit, which she cannot afford. She does not know her adderall dose. We attempted to refer her to another psychiatrist that takes her insurance. She states they called her and said something about her records from Dr. Toy Care, but she didn't know if she was supposed to get them or if they were and they never called back, so she never went.   She does also still question having heart failure because of her family history and the continued edema. She did have her BNP checked as well as kidney function and all were WNL and stable. She does report atypical chest pain that can occur at anytime. This has been consistent per patient for a long time. No DOE, increased SOB. She would like evaluation by a cardiologist.      No Known Allergies Previous Medications   ALPRAZOLAM (XANAX) 0.25 MG TABLET    Take 0.25 mg by mouth 2 (two) times daily.   AMPHETAMINE-DEXTROAMPHETAMINE (ADDERALL PO)    2 IN THE MORNING  AND 1 AT NIGHT   DULOXETINE (CYMBALTA) 20 MG CAPSULE    Take 20 mg by mouth daily.   FOLIC ACID PO    Take 1  tablet by mouth daily. Reported on 08/30/2015   HYDROCHLOROTHIAZIDE (MICROZIDE) 12.5 MG CAPSULE    Take 1 capsule (12.5 mg total) by mouth daily.   MEDROXYPROGESTERONE (PROVERA) 10 MG TABLET    Take one tablet daily for 10 days every 30 days if no spontaneous menses and negative home UPT   OXYBUTYNIN (DITROPAN-XL) 5 MG 24 HR TABLET    Take 1 tablet (5 mg total) by mouth at bedtime.   TINIDAZOLE (TINDAMAX) 500 MG TABLET    Take four tablets today and four tablets tomorrow at the same time    Review of Systems  Constitutional: Positive for fatigue (increase from last time.).  Respiratory: Negative for cough, chest tightness and shortness of breath.   Cardiovascular: Positive for chest pain (stable and unchanged) and leg swelling (slight improvement after medicine but worsens by mid-day). Negative for palpitations.  Gastrointestinal: Negative for nausea, vomiting and abdominal pain.  Endocrine: Negative.   Musculoskeletal: Negative.   Neurological: Negative.   Psychiatric/Behavioral: Positive for dysphoric mood and decreased concentration. Negative for suicidal ideas and self-injury. The patient is nervous/anxious.     Social History  Substance Use Topics  . Smoking status: Never Smoker   . Smokeless tobacco: Never Used  . Alcohol Use: No   Objective:   BP 128/72 mmHg  Pulse 86  Temp(Src) 98.3 F (36.8 C) (Oral)  Resp 16  Wt 274 lb 3.2 oz (124.376 kg)  LMP 10/19/2015  Physical Exam  Constitutional: She appears well-developed and well-nourished. No distress.  Neck: Normal range of motion. Neck supple. No JVD present.  Cardiovascular: Normal rate, regular rhythm and normal heart sounds.  Exam reveals no gallop and no friction rub.   No murmur heard. Pulmonary/Chest: Effort normal and breath sounds normal. No respiratory distress. She has no wheezes. She has no rales.  Skin: She is not diaphoretic.  Psychiatric: She has a normal mood and affect. Her behavior is normal. Judgment and  thought content normal.  Vitals reviewed.      Assessment & Plan:     1. Edema, unspecified type Will increase HCTZ to twice daily. She does have a strong family history of CHF and would like an echocardiogram to be checked for this herself. She is very concerned her heart or kidneys are the reason she is holding fluid. Labs and exams have been unremarkable. Will refer to cardiology for further evaluation. I will see her back after cardiology appointment. - hydrochlorothiazide (MICROZIDE) 12.5 MG capsule; Take 1 capsule (12.5 mg total) by mouth 2 (two) times daily.  Dispense: 60 capsule; Refill: 3 - Ambulatory referral to Cardiology  2. Elevated BP Stable currently with HCTZ. Will monitor BP with addition of second pill. Will f/u after cardiology appointment. - hydrochlorothiazide (MICROZIDE) 12.5 MG capsule; Take 1 capsule (12.5 mg total) by mouth 2 (two) times daily.  Dispense: 60 capsule; Refill: 3 - Ambulatory referral to Cardiology  3. Atypical chest pain See above medical treatment plan for #1. - Ambulatory referral to Cardiology  4. Chronic fatigue Increasing fatigue. Question if due to accumulation of fluid vs worsening and uncontrolled depression and ADHD from where she has not been seen by her psychiatrist, Dr. Toy Care, because of financial constraints.  - Ambulatory referral to Cardiology  5. Clinical depression Will get records from Dr. Starleen Arms office and either try again to get her established with someone locally that takes medicare or will manage here if capable. Will f/u once records received.   6. Difficulty concentrating See above medical treatment plan for #5.        Mar Daring, PA-C  [facility]

## 2015-11-24 DIAGNOSIS — R41840 Attention and concentration deficit: Secondary | ICD-10-CM | POA: Insufficient documentation

## 2015-11-28 ENCOUNTER — Other Ambulatory Visit: Payer: Self-pay | Admitting: Physician Assistant

## 2015-11-28 DIAGNOSIS — F329 Major depressive disorder, single episode, unspecified: Secondary | ICD-10-CM

## 2015-11-28 DIAGNOSIS — F32A Depression, unspecified: Secondary | ICD-10-CM

## 2015-11-28 DIAGNOSIS — T799XXS Unspecified early complication of trauma, sequela: Secondary | ICD-10-CM

## 2015-11-28 DIAGNOSIS — R41840 Attention and concentration deficit: Secondary | ICD-10-CM

## 2015-11-28 DIAGNOSIS — M542 Cervicalgia: Secondary | ICD-10-CM

## 2015-12-02 ENCOUNTER — Ambulatory Visit (INDEPENDENT_AMBULATORY_CARE_PROVIDER_SITE_OTHER): Payer: Commercial Managed Care - HMO | Admitting: Psychiatry

## 2015-12-02 ENCOUNTER — Encounter: Payer: Self-pay | Admitting: Psychiatry

## 2015-12-02 VITALS — BP 138/82 | HR 101 | Temp 98.4°F | Ht 67.0 in | Wt 272.0 lb

## 2015-12-02 DIAGNOSIS — F4010 Social phobia, unspecified: Secondary | ICD-10-CM

## 2015-12-02 DIAGNOSIS — F3160 Bipolar disorder, current episode mixed, unspecified: Secondary | ICD-10-CM | POA: Diagnosis not present

## 2015-12-02 MED ORDER — GABAPENTIN 300 MG PO CAPS: 300.0000 mg | ORAL_CAPSULE | Freq: Two times a day (BID) | ORAL | Status: DC

## 2015-12-02 MED ORDER — QUETIAPINE FUMARATE 50 MG PO TABS: 50.0000 mg | ORAL_TABLET | Freq: Every day | ORAL | Status: DC

## 2015-12-02 MED ORDER — TOPIRAMATE 50 MG PO TABS: 50.0000 mg | ORAL_TABLET | Freq: Every day | ORAL | Status: DC
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
## 2015-12-15 ENCOUNTER — Other Ambulatory Visit: Payer: Self-pay | Admitting: Psychiatry

## 2015-12-20 NOTE — Telephone Encounter (Signed)
spoke with patient, she stated that she take the cymbalta 120mg .  then phone disconnected.  Tried to call her right back but it went to voice mail. So I left a message to call our office back.

## 2015-12-21 NOTE — Telephone Encounter (Signed)
Cymbalta was decreased to 60mg  daily. Pt was supposed to f/u in 3 weeks. Last seen 12/02/15. When is her next appoint ??

## 2015-12-21 NOTE — Telephone Encounter (Signed)
pt has an appt on 12-23-15

## 2015-12-23 ENCOUNTER — Encounter: Payer: Self-pay | Admitting: Psychiatry

## 2015-12-23 ENCOUNTER — Ambulatory Visit (INDEPENDENT_AMBULATORY_CARE_PROVIDER_SITE_OTHER): Payer: Commercial Managed Care - HMO | Admitting: Psychiatry

## 2015-12-23 VITALS — BP 142/96 | HR 106 | Temp 98.3°F | Ht 67.0 in | Wt 262.6 lb

## 2015-12-23 DIAGNOSIS — F3160 Bipolar disorder, current episode mixed, unspecified: Secondary | ICD-10-CM

## 2015-12-23 MED ORDER — TRAZODONE HCL 100 MG PO TABS: 100.0000 mg | ORAL_TABLET | Freq: Every day | ORAL | Status: DC

## 2015-12-23 MED ORDER — ARIPIPRAZOLE 5 MG PO TABS
5.0000 mg | ORAL_TABLET | Freq: Every day | ORAL | Status: DC
Start: 2015-12-23 — End: 2017-04-16

## 2015-12-23 MED ORDER — TOPIRAMATE 50 MG PO TABS: 50.0000 mg | ORAL_TABLET | Freq: Every day | ORAL | Status: DC

## 2015-12-23 MED ORDER — DULOXETINE HCL 60 MG PO CPEP: 120.0000 mg | ORAL_CAPSULE | Freq: Every day | ORAL | Status: DC
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
## 2016-01-26 ENCOUNTER — Other Ambulatory Visit: Payer: Self-pay | Admitting: Psychiatry

## 2016-02-01 ENCOUNTER — Other Ambulatory Visit: Payer: Self-pay | Admitting: Psychiatry

## 2016-02-17 ENCOUNTER — Telehealth: Payer: Self-pay | Admitting: *Deleted

## 2016-02-17 DIAGNOSIS — N3281 Overactive bladder: Secondary | ICD-10-CM

## 2016-02-17 MED ORDER — FLUCONAZOLE 150 MG PO TABS: ORAL_TABLET | ORAL | Status: DC

## 2016-02-17 MED ORDER — OXYBUTYNIN CHLORIDE ER 5 MG PO TB24: 5.0000 mg | ORAL_TABLET | Freq: Every day | ORAL | Status: DC

## 2016-02-17 NOTE — Telephone Encounter (Signed)
Okay for Diflucan 100 mg one twice weekly, #30. Is she  on the oxybutynin  5 mg for overactive bladder?  How long has she been on, can cause constipation, dry mouth and other issues she has?  Let me know.

## 2016-02-17 NOTE — Telephone Encounter (Signed)
Left on pt voicemail Rx sent. 

## 2016-02-17 NOTE — Telephone Encounter (Signed)
Pt called requesting refill on oxybutynin 5  Mg and diflucan twice weekly dose. States you prescribed this in past recurrent yeast is starting to occur again. please advise

## 2016-03-11 ENCOUNTER — Other Ambulatory Visit: Payer: Self-pay | Admitting: Psychiatry

## 2016-03-12 NOTE — Telephone Encounter (Signed)
Pt need appointment

## 2016-03-15 ENCOUNTER — Other Ambulatory Visit: Payer: Self-pay | Admitting: Psychiatry

## 2016-03-16 ENCOUNTER — Telehealth: Payer: Self-pay | Admitting: Physician Assistant

## 2016-03-16 NOTE — Telephone Encounter (Signed)
Pt wants you to know that she is having a hard time getting [facility] to refill her medications.  She first called Monday and has followed up with [facility] to see if they had filled her RX's but they had not.  She is out of most all her prescriptions.  She called the office and they are closed.  Please advise  [phone]  Thanks, Con Memos

## 2016-03-19 MED ORDER — TOPIRAMATE 50 MG PO TABS: 50.0000 mg | ORAL_TABLET | Freq: Every day | ORAL | 0 refills | Status: DC

## 2016-03-19 MED ORDER — GABAPENTIN 300 MG PO CAPS: 300.0000 mg | ORAL_CAPSULE | Freq: Two times a day (BID) | ORAL | 0 refills | Status: DC

## 2016-03-19 MED ORDER — DULOXETINE HCL 60 MG PO CPEP: 120.0000 mg | ORAL_CAPSULE | Freq: Every day | ORAL | 0 refills | Status: DC

## 2016-03-19 MED ORDER — PREGABALIN 200 MG PO CAPS: 200.0000 mg | ORAL_CAPSULE | Freq: Two times a day (BID) | ORAL | 0 refills | Status: DC

## 2016-03-19 NOTE — Telephone Encounter (Signed)
I dont see any notes where she has called them in Epic and they use this as I can see the other notes. Also she was to have followed up with them at the end of June-early July so she may not be filling them because she wants to make sure she is ok on these medications.   I can fill for the interim until she follows up with them soon. I can give one month supply. She needs to let me know which medications she needs.

## 2016-03-19 NOTE — Telephone Encounter (Signed)
Patient advised as directed below. Patient states she has left several messages at [facility] And has not received a call back. Patient states she was not aware of a follow up appointment either.   Patient is requesting a refill on Cymbalta, Gabapentin, Lyrica, and Topamax.   Patient said she will call again and try to schedule a appointment.

## 2016-03-19 NOTE — Telephone Encounter (Signed)
Refilled for 30 days.  Please call in Lyrica 200mg  caps Take 1 cap PO BID #[id number]

## 2016-03-19 NOTE — Telephone Encounter (Signed)
RX called in at [facility] Patient advised only a one month supply has been sent, and she needs to follow up with [facility]

## 2016-05-29 ENCOUNTER — Other Ambulatory Visit: Payer: Self-pay | Admitting: Physician Assistant

## 2016-05-29 DIAGNOSIS — R609 Edema, unspecified: Secondary | ICD-10-CM

## 2016-07-26 ENCOUNTER — Telehealth: Payer: Self-pay

## 2016-07-26 DIAGNOSIS — N3281 Overactive bladder: Secondary | ICD-10-CM

## 2016-07-26 MED ORDER — FLUCONAZOLE 150 MG PO TABS: ORAL_TABLET | ORAL | 0 refills | Status: DC

## 2016-07-26 MED ORDER — OXYBUTYNIN CHLORIDE ER 5 MG PO TB24: 5.0000 mg | ORAL_TABLET | Freq: Every day | ORAL | 0 refills | Status: DC

## 2016-07-26 NOTE — Telephone Encounter (Signed)
Pharmacy sent a note for new Rxs for Oxybutin and Fluconazole as they are her new mail order Rx. [facility] provided new Rx's on their form and form faxed. New Rx's put in Epic as well.

## 2016-07-27 ENCOUNTER — Other Ambulatory Visit: Payer: Self-pay

## 2016-07-27 DIAGNOSIS — R609 Edema, unspecified: Secondary | ICD-10-CM

## 2016-07-27 DIAGNOSIS — E119 Type 2 diabetes mellitus without complications: Secondary | ICD-10-CM

## 2016-07-27 MED ORDER — ADVOCATE CONTROL SOLUTION HIGH VI LIQD: 0 refills | Status: DC

## 2016-07-27 MED ORDER — HYDROCHLOROTHIAZIDE 12.5 MG PO CAPS: 12.5000 mg | ORAL_CAPSULE | Freq: Two times a day (BID) | ORAL | 1 refills | Status: DC

## 2016-07-27 MED ORDER — BD SWAB SINGLE USE REGULAR PADS: MEDICATED_PAD | 1 refills | Status: DC

## 2016-07-27 MED ORDER — GLUCOSE BLOOD VI STRP: ORAL_STRIP | 12 refills | Status: DC

## 2016-07-27 MED ORDER — LANCETS 28G MISC: 5 refills | Status: DC

## 2016-07-27 MED ORDER — TRUE METRIX AIR GLUCOSE METER DEVI: 1.0000 | Freq: Once | 0 refills | Status: AC

## 2016-07-27 NOTE — Telephone Encounter (Signed)
Pharmacy requesting refill. Last ov 11/23/15.  Please review. Thank you.

## 2016-08-17 ENCOUNTER — Other Ambulatory Visit: Payer: Self-pay

## 2016-08-17 MED ORDER — FLUCONAZOLE 150 MG PO TABS: ORAL_TABLET | ORAL | 0 refills | Status: DC

## 2016-08-17 NOTE — Telephone Encounter (Signed)
Ok for refill? 

## 2016-09-10 ENCOUNTER — Other Ambulatory Visit: Payer: Self-pay

## 2016-11-16 ENCOUNTER — Other Ambulatory Visit: Payer: Self-pay

## 2016-11-17 ENCOUNTER — Other Ambulatory Visit: Payer: Self-pay | Admitting: Women's Health

## 2016-11-17 DIAGNOSIS — N3281 Overactive bladder: Secondary | ICD-10-CM

## 2016-12-12 ENCOUNTER — Encounter: Payer: Self-pay | Admitting: Gynecology

## 2017-01-21 ENCOUNTER — Other Ambulatory Visit: Payer: Self-pay | Admitting: Physician Assistant

## 2017-01-21 DIAGNOSIS — R609 Edema, unspecified: Secondary | ICD-10-CM

## 2017-03-27 ENCOUNTER — Telehealth: Payer: Self-pay | Admitting: Family Medicine

## 2017-04-09 ENCOUNTER — Emergency Department
Admission: EM | Admit: 2017-04-09 | Discharge: 2017-04-10 | Disposition: A | Discharge status: Other Acute Inpatient Hospital | Payer: Medicare HMO | Attending: Emergency Medicine | Admitting: Emergency Medicine

## 2017-04-09 ENCOUNTER — Encounter: Payer: Self-pay | Admitting: *Deleted

## 2017-04-09 DIAGNOSIS — Z79899 Other long term (current) drug therapy: Secondary | ICD-10-CM | POA: Insufficient documentation

## 2017-04-09 DIAGNOSIS — G43819 Other migraine, intractable, without status migrainosus: Secondary | ICD-10-CM | POA: Diagnosis present

## 2017-04-09 DIAGNOSIS — F339 Major depressive disorder, recurrent, unspecified: Secondary | ICD-10-CM | POA: Diagnosis not present

## 2017-04-09 DIAGNOSIS — F315 Bipolar disorder, current episode depressed, severe, with psychotic features: Secondary | ICD-10-CM

## 2017-04-09 DIAGNOSIS — F332 Major depressive disorder, recurrent severe without psychotic features: Secondary | ICD-10-CM

## 2017-04-09 LAB — CBC
HEMATOCRIT: 35.6 % (ref 35.0–47.0)
HEMOGLOBIN: 12.1 g/dL (ref 12.0–16.0)
MCH: 29.2 pg (ref 26.0–34.0)
MCHC: 33.9 g/dL (ref 32.0–36.0)
MCV: 86 fL (ref 80.0–100.0)
Platelets: 237 10*3/uL (ref 150–440)
RBC: 4.14 MIL/uL (ref 3.80–5.20)
RDW: 15.5 % — ABNORMAL HIGH (ref 11.5–14.5)
WBC: 6.9 10*3/uL (ref 3.6–11.0)

## 2017-04-09 LAB — COMPREHENSIVE METABOLIC PANEL
ALBUMIN: 3.5 g/dL (ref 3.5–5.0)
ALT: 12 U/L — ABNORMAL LOW (ref 14–54)
ANION GAP: 8 (ref 5–15)
AST: 17 U/L (ref 15–41)
Alkaline Phosphatase: 84 U/L (ref 38–126)
BUN: 15 mg/dL (ref 6–20)
CO2: 26 mmol/L (ref 22–32)
Calcium: 8.2 mg/dL — ABNORMAL LOW (ref 8.9–10.3)
Chloride: 106 mmol/L (ref 101–111)
Creatinine, Ser: 0.86 mg/dL (ref 0.44–1.00)
GFR calc non Af Amer: >60 mL/min (ref >60)
GLUCOSE: 100 mg/dL — AB (ref 65–99)
POTASSIUM: 3.2 mmol/L — AB (ref 3.5–5.1)
SODIUM: 140 mmol/L (ref 135–145)
Total Bilirubin: 0.3 mg/dL (ref 0.3–1.2)
Total Protein: 7 g/dL (ref 6.5–8.1)

## 2017-04-09 LAB — URINE DRUG SCREEN, QUALITATIVE (ARMC ONLY)
AMPHETAMINES, UR SCREEN: NOT DETECTED
BENZODIAZEPINE, UR SCRN: POSITIVE — AB
Barbiturates, Ur Screen: NOT DETECTED
COCAINE METABOLITE, UR ~~LOC~~: NOT DETECTED
Cannabinoid 50 Ng, Ur ~~LOC~~: NOT DETECTED
MDMA (Ecstasy)Ur Screen: NOT DETECTED
METHADONE SCREEN, URINE: NOT DETECTED
OPIATE, UR SCREEN: NOT DETECTED
Phencyclidine (PCP) Ur S: NOT DETECTED
TRICYCLIC, UR SCREEN: NOT DETECTED

## 2017-04-09 LAB — ETHANOL: Alcohol, Ethyl (B): <5 mg/dL (ref <5)

## 2017-04-09 LAB — SALICYLATE LEVEL

## 2017-04-09 LAB — ACETAMINOPHEN LEVEL
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
## 2017-04-09 NOTE — ED Notes (Signed)
Pt has lights dimmed and is resting in bed. Pt in NAD and remains calm and cooperative. Door closed but blinds are open for continued observation.

## 2017-04-09 NOTE — ED Notes (Signed)
Meal tray provided. PT had not had dinner. Pt updated on tele psych delay. Pt comfortable and verbalized understanding of delay.

## 2017-04-09 NOTE — ED Notes (Signed)
Pt reports needing cane to ambulate and is a fall risk in BHU without cane. Pt will be kept in Quad for psychiatric assessment.

## 2017-04-09 NOTE — ED Notes (Signed)
[place] Set up in patients room.

## 2017-04-09 NOTE — ED Provider Notes (Signed)
[facility]  ____________________________________________   I have reviewed the triage vital signs and the nursing notes.   HISTORY  Chief Complaint Behavioral Med Eval    HPI Barbara Anderson is a 46 y.o. female With some thoughts of self-harm. Has a long history of depression. Feels that she would be better off dead. She denies having taken an overdose, she denies hearing voices at this time, she statesthat she has had some AH and VH in the past. She has had trouble sleeping. No HI. She has been feeling suicidal for years worse over the last few days nothing makes it better nothing makes it worse, no other alleviating or aggravating factors elicited     Past Medical History:  Diagnosis Date  . ADD (attention deficit disorder)   . Anxiety   . Concussion 2001   head trauma-work related. Caused headaches and short term memory loss.  . Depression   . Overactive bladder   . Recurrent vaginitis     Patient Active Problem List   Diagnosis Date Noted  . Difficulty concentrating 11/24/2015  . Abnormal bruising 10/26/2015  . Accumulation of fluid in tissues 10/26/2015  . Fibrositis 10/26/2015  . Cervical pain 10/26/2015  . Adiposity 10/26/2015  . Disorder of labyrinth 10/26/2015  . Oligomenorrhea 08/30/2015  . Weight gain 08/30/2015  . Costochondritis 04/14/2015  . Dyspnea 04/14/2015  . Recurrent vaginitis   . Clinical depression 09/21/2008  . Late effect of certain complications of trauma 40/98/1191  . Anal bleeding 09/10/2008  . Headache, variant migraine, intractable 04/16/2006    Past Surgical History:  Procedure Laterality Date  . ankle pin and plates placement     right  . COSMETIC SURGERY  2005   BREASTS, ARM, ABDOMEN AFTER WEIGHT LOSS    Prior to Admission medications   Medication Sig Start Date End Date Taking? Authorizing Provider  Alcohol Swabs (B-D SINGLE USE SWABS REGULAR) PADS Use to  cleanse skin once daily before checking blood sugar 07/27/16   Mar Daring, PA-C  ARIPiprazole (ABILIFY) 5 MG tablet Take 1 tablet (5 mg total) by mouth daily. 12/23/15   Rainey Pines, MD  Blood Glucose Calibration (ADVOCATE CONTROL SOLUTION) High LIQD Check blood glucose monitoring device as directed by manual 07/27/16   Mar Daring, PA-C  DULoxetine (CYMBALTA) 60 MG capsule Take 2 capsules (120 mg total) by mouth daily. 03/19/16   Mar Daring, PA-C  fluconazole (DIFLUCAN) 150 MG tablet Take one tablet by mouth twice weekly for yeast 08/17/16   Huel Cote, NP  FOLIC ACID PO Take 1 tablet by mouth daily. Reported on 08/30/2015    [provider]  gabapentin (NEURONTIN) 300 MG capsule Take 1 capsule (300 mg total) by mouth 2 (two) times daily. 03/19/16   Mar Daring, PA-C  glucose blood test strip Check blood sugar once daily 07/27/16   Fenton Malling M, PA-C  hydrochlorothiazide (MICROZIDE) 12.5 MG capsule TAKE 1 CAPSULE TWICE DAILY 01/21/17   Mar Daring, PA-C  Lancets 28G MISC Check blood sugar once daily 07/27/16   Fenton Malling M, PA-C  medroxyPROGESTERone (PROVERA) 10 MG tablet Take one tablet daily for 10 days every 30 days if no spontaneous menses and negative home UPT 08/30/15   Terrance Mass, MD  oxybutynin (DITROPAN-XL) 5 MG 24 hr tablet Take 1 tablet (5 mg total) by mouth at bedtime. 07/26/16   Huel Cote, NP  pregabalin (LYRICA) 200 MG capsule  Take 1 capsule (200 mg total) by mouth 2 (two) times daily. 03/19/16   Mar Daring, PA-C  topiramate (TOPAMAX) 50 MG tablet Take 1 tablet (50 mg total) by mouth daily. 03/19/16   Mar Daring, PA-C  traZODone (DESYREL) 100 MG tablet Take 1 tablet (100 mg total) by mouth at bedtime. 12/23/15   Rainey Pines, MD  Vitamin D, Ergocalciferol, (DRISDOL) 50000 units CAPS capsule Take 50,000 Units by mouth every 7 (seven) days.    [provider]     Allergies Patient has no known allergies.  Family History  Problem Relation Age of Onset  . Diabetes Mother   . Hypertension Mother   . Depression Mother   . Diabetes Father   . Hypertension Father   . Anxiety disorder Father   . Depression Father   . ADD / ADHD Father   . Depression Sister   . Bipolar disorder Sister   . ADD / ADHD Brother     Social History Social History  Substance Use Topics  . Smoking status: Never Smoker  . Smokeless tobacco: Never Used  . Alcohol use No    Review of Systems Constitutional: No fever/chills Eyes: No visual changes. ENT: No sore throat. No stiff neck no neck pain Cardiovascular: Denies chest pain. Respiratory: Denies shortness of breath. Gastrointestinal:   no vomiting.  No diarrhea.  No constipation. Genitourinary: Negative for dysuria. Musculoskeletal: Negative lower extremity swelling Skin: Negative for rash. Neurological: Negative for severe headaches, focal weakness or numbness.   ____________________________________________   PHYSICAL EXAM:  VITAL SIGNS: ED Triage Vitals [04/09/17 1742]  Enc Vitals Group     BP 136/74     Pulse Rate 75     Resp 16     Temp 98.5 F (36.9 C)     Temp Source Oral     SpO2 100 %     Weight 262 lb (118.8 kg)     Height      Head Circumference      Peak Flow      Pain Score      Pain Loc      Pain Edu?      Excl. in [place]?     Constitutional: Alert and oriented. Well appearing and in no acute distress. Eyes: Conjunctivae are normal Head: Atraumatic HEENT: No congestion/rhinnorhea. Mucous membranes are moist.  Oropharynx non-erythematous Neck:   Nontender with no meningismus, no masses, no stridor Cardiovascular: Normal rate, regular rhythm. Grossly normal heart sounds.  Good peripheral circulation. Respiratory: Normal respiratory effort.  No retractions. Lungs CTAB. Abdominal: Soft and nontender. No distention. No guarding no rebound Back:  There is no focal tenderness or  step off.  there is no midline tenderness there are no lesions noted. there is no CVA tenderness Musculoskeletal: No lower extremity tenderness, no upper extremity tenderness. No joint effusions, no DVT signs strong distal pulses no edema Neurologic:  Normal speech and language. No gross focal neurologic deficits are appreciated.  Skin:  Skin is warm, dry and intact. No rash noted. Psychiatric: Mood and affect are normal. Speech and behavior are normal.  ____________________________________________   LABS (all labs ordered are listed, but only abnormal results are displayed)  Labs Reviewed  CBC - Abnormal; Notable for the following:       Result Value   RDW 15.5 (*)    All other components within normal limits  COMPREHENSIVE METABOLIC PANEL  ETHANOL  SALICYLATE LEVEL  ACETAMINOPHEN LEVEL  URINE DRUG SCREEN,  QUALITATIVE ([facility] ONLY)   ____________________________________________  EKG  I personally interpreted any EKGs ordered by me or triage  ____________________________________________  RADIOLOGY  I reviewed any imaging ordered by me or triage that were performed during my shift and, if possible, patient and/or family made aware of any abnormal findings. ____________________________________________   PROCEDURES  Procedure(s) performed: None  Procedures  Critical Care performed: None  ____________________________________________   INITIAL IMPRESSION / ASSESSMENT AND PLAN / ED COURSE  Pertinent labs & imaging results that were available during my care of the patient were reviewed by me and considered in my medical decision making (see chart for details).  patient here with voluntary desire for psychiatric assistance we will consult psychiatry. No evidence of acute medical issue    ____________________________________________   FINAL CLINICAL IMPRESSION(S) / ED DIAGNOSES  Final diagnoses:  None      This chart was dictated using voice recognition  software.  Despite best efforts to proofread,  errors can occur which can change meaning.      Schuyler Amor, MD 04/09/17 [phone]

## 2017-04-09 NOTE — ED Notes (Signed)
All of pts belongings were sent home with pts significant other. Significant other informed of visitation and calling policy.

## 2017-04-09 NOTE — ED Triage Notes (Addendum)
Pt to ED requesting Behavioral medication evaluation. Pt reports SI and this time and reports having a hx of the same with multiple attempted suicides as a teenager. PT has an outpatient psychiatrist and reports taking medications appropriately but reports she no longer feels as though they are helping. Pt reports seeing shadows and hearing whispers for the past 6 months but denies recognizing these shadows and denies knowing what the voices are whispering. Pt reports difficulty sleeping. Difficulty both falling and staying asleep dues to dreams. Pt has PTSD and reports increased stress over the past 2 years with a divorce and her mother and father dying. Pt denies drug and alcohol use. Pt  Denies HI.

## 2017-04-10 ENCOUNTER — Encounter: Payer: Self-pay | Admitting: *Deleted

## 2017-04-10 ENCOUNTER — Encounter: Payer: Commercial Managed Care - HMO | Admitting: Women's Health

## 2017-04-10 ENCOUNTER — Inpatient Hospital Stay
Admission: AD | Admit: 2017-04-10 | Discharge: 2017-04-16 | DRG: 885 | Disposition: A | Discharge status: Home / Self Care | Payer: Medicare HMO | Attending: Psychiatry | Admitting: Psychiatry

## 2017-04-10 DIAGNOSIS — G894 Chronic pain syndrome: Secondary | ICD-10-CM | POA: Diagnosis present

## 2017-04-10 DIAGNOSIS — E1159 Type 2 diabetes mellitus with other circulatory complications: Secondary | ICD-10-CM | POA: Diagnosis present

## 2017-04-10 DIAGNOSIS — S069X9A Unspecified intracranial injury with loss of consciousness of unspecified duration, initial encounter: Secondary | ICD-10-CM | POA: Diagnosis present

## 2017-04-10 DIAGNOSIS — G4700 Insomnia, unspecified: Secondary | ICD-10-CM | POA: Diagnosis present

## 2017-04-10 DIAGNOSIS — M797 Fibromyalgia: Secondary | ICD-10-CM | POA: Diagnosis present

## 2017-04-10 DIAGNOSIS — M549 Dorsalgia, unspecified: Secondary | ICD-10-CM | POA: Diagnosis not present

## 2017-04-10 DIAGNOSIS — Z87820 Personal history of traumatic brain injury: Secondary | ICD-10-CM | POA: Diagnosis not present

## 2017-04-10 DIAGNOSIS — S069XAA Unspecified intracranial injury with loss of consciousness status unknown, initial encounter: Secondary | ICD-10-CM | POA: Diagnosis present

## 2017-04-10 DIAGNOSIS — F419 Anxiety disorder, unspecified: Secondary | ICD-10-CM | POA: Diagnosis present

## 2017-04-10 DIAGNOSIS — Z79899 Other long term (current) drug therapy: Secondary | ICD-10-CM

## 2017-04-10 DIAGNOSIS — Z9119 Patient's noncompliance with other medical treatment and regimen: Secondary | ICD-10-CM

## 2017-04-10 DIAGNOSIS — Z818 Family history of other mental and behavioral disorders: Secondary | ICD-10-CM | POA: Diagnosis not present

## 2017-04-10 DIAGNOSIS — F332 Major depressive disorder, recurrent severe without psychotic features: Secondary | ICD-10-CM

## 2017-04-10 DIAGNOSIS — F4001 Agoraphobia with panic disorder: Secondary | ICD-10-CM | POA: Diagnosis not present

## 2017-04-10 DIAGNOSIS — R32 Unspecified urinary incontinence: Secondary | ICD-10-CM | POA: Diagnosis present

## 2017-04-10 DIAGNOSIS — I10 Essential (primary) hypertension: Secondary | ICD-10-CM | POA: Diagnosis present

## 2017-04-10 DIAGNOSIS — G43909 Migraine, unspecified, not intractable, without status migrainosus: Secondary | ICD-10-CM | POA: Diagnosis present

## 2017-04-10 DIAGNOSIS — N3281 Overactive bladder: Secondary | ICD-10-CM | POA: Diagnosis present

## 2017-04-10 DIAGNOSIS — F319 Bipolar disorder, unspecified: Secondary | ICD-10-CM | POA: Diagnosis present

## 2017-04-10 DIAGNOSIS — F315 Bipolar disorder, current episode depressed, severe, with psychotic features: Secondary | ICD-10-CM

## 2017-04-10 DIAGNOSIS — R45851 Suicidal ideations: Secondary | ICD-10-CM | POA: Diagnosis not present

## 2017-04-10 DIAGNOSIS — Z915 Personal history of self-harm: Secondary | ICD-10-CM

## 2017-04-10 DIAGNOSIS — M810 Age-related osteoporosis without current pathological fracture: Secondary | ICD-10-CM | POA: Diagnosis present

## 2017-04-10 DIAGNOSIS — R7303 Prediabetes: Secondary | ICD-10-CM | POA: Diagnosis present

## 2017-04-10 DIAGNOSIS — Z9114 Patient's other noncompliance with medication regimen: Secondary | ICD-10-CM | POA: Diagnosis not present

## 2017-04-10 DIAGNOSIS — F4310 Post-traumatic stress disorder, unspecified: Secondary | ICD-10-CM | POA: Diagnosis not present

## 2017-04-10 DIAGNOSIS — Z5181 Encounter for therapeutic drug level monitoring: Secondary | ICD-10-CM | POA: Diagnosis not present

## 2017-04-10 DIAGNOSIS — F429 Obsessive-compulsive disorder, unspecified: Secondary | ICD-10-CM | POA: Diagnosis not present

## 2017-04-10 MED ORDER — MAGNESIUM HYDROXIDE 400 MG/5ML PO SUSP
30.0000 mL | Freq: Every day | ORAL | Status: DC | PRN
  Administered 2017-04-13: 30 mL via ORAL
  Filled 2017-04-10: qty 30

## 2017-04-10 MED ORDER — TOPIRAMATE 100 MG PO TABS
50.0000 mg | ORAL_TABLET | Freq: Every day | ORAL | Status: DC
  Administered 2017-04-10: 50 mg via ORAL
  Filled 2017-04-10: qty 1

## 2017-04-10 MED ORDER — DULOXETINE HCL 60 MG PO CPEP: 60.0000 mg | ORAL_CAPSULE | Freq: Two times a day (BID) | ORAL | Status: DC

## 2017-04-10 MED ORDER — ALUM & MAG HYDROXIDE-SIMETH 200-200-20 MG/5ML PO SUSP: 30.0000 mL | ORAL | Status: DC | PRN

## 2017-04-10 MED ORDER — TOPIRAMATE 25 MG PO TABS: 50.0000 mg | ORAL_TABLET | Freq: Every day | ORAL | Status: DC

## 2017-04-10 MED ORDER — ACETAMINOPHEN 325 MG PO TABS
650.0000 mg | ORAL_TABLET | Freq: Four times a day (QID) | ORAL | Status: DC | PRN
Start: 2017-04-10 — End: 2017-04-16
  Administered 2017-04-11: 650 mg via ORAL

## 2017-04-10 MED ORDER — DULOXETINE HCL 30 MG PO CPEP
60.0000 mg | ORAL_CAPSULE | Freq: Two times a day (BID) | ORAL | Status: DC
  Administered 2017-04-10 – 2017-04-16 (×12): 60 mg via ORAL
  Filled 2017-04-10 (×12): qty 2

## 2017-04-10 NOTE — ED Notes (Signed)
Patient observed lying in bed with eyes closed  Even, unlabored respirations observed   NAD pt appears to be sleeping  I will continue to monitor along with every 15 minute visual observations and ongoing security monitoring    

## 2017-04-10 NOTE — Progress Notes (Signed)
Patient admitted to unit. Alert and orient x4. Denies HI, AVH. Endorses passive SI. Patient depressed due to passing of parents and divorce of spouse. No children, disabled, lives alone. Admission assessment completed. SKin and contraband search completed and witnessed by Silva Bandy, Therapist, sports. No skin issues noted, no contraband found. Oriented patient to room and unit. Fluid and nutrition offered. Patient stable at discharge.

## 2017-04-10 NOTE — ED Notes (Signed)
BEHAVIORAL HEALTH ROUNDING Patient sleeping: No. Patient alert and oriented: yes Behavior appropriate: Yes.  ; If no, describe:  Nutrition and fluids offered: yes Toileting and hygiene offered: Yes  Sitter present: q15 minute observations and security  monitoring Law enforcement present: Yes  ODS  

## 2017-04-10 NOTE — ED Notes (Signed)
ED Is the patient under IVC or is there intent for IVC: Yes.   Is the patient medically cleared: Yes.   Is there vacancy in the ED BHU: Yes.   Is the population mix appropriate for patient: Yes.   Is the patient awaiting placement in inpatient or outpatient setting: Yes.   Has the patient had a psychiatric consult: Yes.   Survey of unit performed for contraband, proper placement and condition of furniture, tampering with fixtures in bathroom, shower, and each patient room: Yes.  ; Findings:  APPEARANCE/BEHAVIOR Calm and cooperative NEURO ASSESSMENT Orientation: oriented x4 Denies pain Hallucinations: No.None noted (Hallucinations) Speech: Normal Gait: normal RESPIRATORY ASSESSMENT Even  Unlabored respirations  CARDIOVASCULAR ASSESSMENT Pulses equal   regular rate  Skin warm and dry   GASTROINTESTINAL ASSESSMENT no GI complaint EXTREMITIES Full ROM  PLAN OF CARE Provide calm/safe environment. Vital signs assessed twice daily. ED BHU Assessment once each 12-hour shift. Collaborate with TTS daily or as condition indicates. Assure the ED provider has rounded once each shift. Provide and encourage hygiene. Provide redirection as needed. Assess for escalating behavior; address immediately and inform ED provider.  Assess family dynamic and appropriateness for visitation as needed: Yes.  ; If necessary, describe findings:  Educate the patient/family about BHU procedures/visitation: Yes.  ; If necessary, describe findings:

## 2017-04-10 NOTE — Tx Team (Signed)
Initial Treatment Plan 04/10/2017 6:25 PM [name] SEG:[id number]    PATIENT STRESSORS: Educational concerns Health problems Loss of mother  last year  Medication change or noncompliance   PATIENT STRENGTHS: Ability for insight Average or above average intelligence Capable of independent living Communication skills Supportive family/friends   PATIENT IDENTIFIED PROBLEMS: Depression 04/10/2017  Infective Coping 04/10/17                   DISCHARGE CRITERIA:  Ability to meet basic life and health needs Adequate post-discharge living arrangements Improved stabilization in mood, thinking, and/or behavior  PRELIMINARY DISCHARGE PLAN: Outpatient therapy Return to previous living arrangement  PATIENT/FAMILY INVOLVEMENT: This treatment plan has been presented to and reviewed with the patient, Barbara Anderson, and/or family member,  .  The patient and family have been given the opportunity to ask questions and make suggestions.  Leodis Liverpool, RN 04/10/2017, 6:25 PM

## 2017-04-10 NOTE — BH Assessment (Signed)
Pt RN (Amy) attempted to call report to [facility] who states pt has no bed. Clinician contacted facility. Representative was [employer]. Clinician explained that [facility] provided TTS with accepting information.   Representative states Deena left no note regarding pt being acceptance however, there is a note stating additional information regarding pt's diabetes and h/o TBI is needed.  Clinician sees h/o concussion noted in chart however, no mention of TBI. Pt's chart also does not note pt h/o diabetes. Family diabetes hx is mentioned.  RN informed and is to enter applicable note. TTS to continue seeking placement.

## 2017-04-10 NOTE — Plan of Care (Signed)
Problem: Education: Goal: Knowledge of [facility] Education information/materials will improve Outcome: Not Progressing New Admission   Problem: Coping: Goal: Ability to cope will improve Outcome: Not Progressing New Admission  Goal: Ability to verbalize feelings will improve Outcome: Not Progressing New Admission   Problem: Safety: Goal: Ability to disclose and discuss suicidal ideas will improve Outcome: Progressing Denies suicidal ideas Goal: Ability to identify and utilize support systems that promote safety will improve Outcome: Not Progressing New Admission

## 2017-04-10 NOTE — ED Notes (Signed)

## 2017-04-10 NOTE — BH Assessment (Signed)
Patient is to be admitted to [facility] by Dr. Weber Cooks.  Attending Physician will be Dr. Bary Leriche.   Patient has been assigned to room 311, by [place].   ER staff is aware of the admission (  April, ER Sect.; Dr. Burlene Arnt, ER MD; Amy, Patient's Nurse & Genella Rife, Patient Access).

## 2017-04-10 NOTE — ED Notes (Signed)
Pt verbalizes to me  "I do not have diabetes - I check my sugar every day because my momma and daddy had sugar - I check it and it is usually about 80 - I don't take any medicine for it."  Pt ambulates with a cane due to pins in her ankles  NAD assessed

## 2017-04-10 NOTE — ED Provider Notes (Signed)
SOC Dr. Hinton Lovely recommends inpatient admission.   Darel Hong, MD 04/10/17 [phone]

## 2017-04-10 NOTE — ED Notes (Signed)
Pt with a visit  - observed by myself and the quad EDT   NAD observed  Continue to monitor

## 2017-04-10 NOTE — Consult Note (Signed)
[facility] Face-to-Face Psychiatry Consult   Reason for Consult:  Consult for 46 year old woman with a history of mood symptoms who came voluntarily complaining of worsening depression Referring Physician:  [place]ane Patient Identification: Barbara Anderson MRN:  [id number] Principal Diagnosis: Severe recurrent major depression without psychotic features [facility]) Diagnosis:   Patient Active Problem List   Diagnosis Date Noted  . Severe recurrent major depression without psychotic features ([place]) [F33.2] 04/10/2017  . Difficulty concentrating [R41.840] 11/24/2015  . Abnormal bruising [R23.8] 10/26/2015  . Accumulation of fluid in tissues [R60.9] 10/26/2015  . Fibrositis [M79.7] 10/26/2015  . Cervical pain [M54.2] 10/26/2015  . Adiposity [E66.9] 10/26/2015  . 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 Oligomenorrhea [N91.5] 08/30/2015  . Weight gain [R63.5] 08/30/2015  . Costochondritis [M94.0] 04/14/2015  . Dyspnea [R06.00] 04/14/2015  . Recurrent vaginitis [N76.0]   . Clinical depression [F32.9] 09/21/2008  . Late effect of certain complications of trauma [G95.9XXS] 09/21/2008  . Anal bleeding [K62.5] 09/10/2008  . Headache, variant migraine, intractable [G43.819] 04/16/2006    Total Time spent with patient: 1 hour  Subjective:   Barbara Anderson is a 46 y.o. female patient admitted with "I've just been feeling very depressed and have social anxiety".  HPI:  Patient interviewed chart reviewed. 46 year old woman with past history of depression came voluntarily to the hospital. She tells me that she has been having symptoms of depression that of been going on consistently since her mother died which was about a year ago. Patient says her mood feels sad and down almost all the time. Her energy level is very low. She has no enjoyment in anything. She sleeps poorly at night and does not eat well. She says that she has "been seen shadows" when asked about hallucinations but is vague about it. She says she is  been having suicidal thoughts although without any specific plan other than overdosing. She claims to currently be still taking her Cymbalta which she has been on for years although she says she is not on any other psychiatric medicines and has not been seeing anyone for any mental health follow-up. Patient also says she feels very anxious going out in public and stays isolated to herself all the time. Interestingly, the patient, without prompting from me, recites symptoms in language that could practically be lifted out of the DSM suggesting a real familiarity with psychiatric symptoms and diagnoses. She denies that she is abusing any alcohol or drugs although her drug screen is positive for benzodiazepines. She says that her boyfriend brought her into the hospital because he got sick of her just laying around all the time.  Medical history: Chronic migraines. History of fibromyalgia and back pain.  Substance abuse history: Patient totally denies alcohol or drug abuse although she told me that she should be taking Xanax and clearly likes it a great deal from the way she describes it. There is no evidence anywhere in her chart or in the database of her being prescribed any Xanax. Drug screen however is positive for benzodiazepines. There is also a mention in one old note that there was a time in the past that she was prescribed Adderall and it seemed to of kicked her into some irrational possibly manic behavior. The patient has no memory of this and just remembers Adderall as being something she enjoyed a great deal.  No-show history: Gets disability. Lives by herself. Has a boyfriend who checks on her regularly.  Past Psychiatric History: Patient has had 2 or 3 prior psychiatric  hospitalizations the last one we know what was in 2015 here at our hospital. She has been on Cymbalta most consistently from what I can see. She remembers no other antidepressants. She says she has tried to kill her self but it was  by overdose when she was a teenager. Patient told me at first that her regular outpatient psychiatrist is Dr. Lorine Bears, but that she had not seen her in "a wild". Evidence from this chart suggests that it's probably been well over a year since she last saw Dr. Gretel Acre in our clinic many months ago and even at that time had not been seeing Dr. Robina Ade for a while.  Risk to Self: Suicidal Ideation: Yes-Currently Present Suicidal Intent: Yes-Currently Present Is patient at risk for suicide?: Yes Suicidal Plan?: No Access to Means: No What has been your use of drugs/alcohol within the last 12 months?: none How many times?: 1 Other Self Harm Risks: none Triggers for Past Attempts: Other (Comment) (pt stated it was a long time ago) Intentional Self Injurious Behavior: None Risk to Others: Homicidal Ideation: No Thoughts of Harm to Others: No Current Homicidal Intent: No Current Homicidal Plan: No Access to Homicidal Means: No Identified Victim: NA History of harm to others?: No Assessment of Violence: None Noted Violent Behavior Description: None Does patient have access to weapons?: No Criminal Charges Pending?: No Does patient have a court date: No Prior Inpatient Therapy: Prior Inpatient Therapy: Yes Prior Therapy Dates: as a teenager Prior Therapy Facilty/Provider(s): unknown Reason for Treatment: suicidal Prior Outpatient Therapy: Prior Outpatient Therapy: Yes Prior Therapy Dates: current Prior Therapy Facilty/Provider(s): Dr. Toy Care Reason for Treatment: depression Does patient have an ACCT team?: No Does patient have Intensive In-House Services?  : No Does patient have [facility] services? : No Does patient have P4CC services?: No  Past Medical History:  Past Medical History:  Diagnosis Date  . ADD (attention deficit disorder)   . Anxiety   . Concussion 2001   head trauma-work related. Caused headaches and short term memory loss.  . Depression   . Overactive bladder   . Recurrent  vaginitis     Past Surgical History:  Procedure Laterality Date  . ankle pin and plates placement     right  . COSMETIC SURGERY  2005   BREASTS, ARM, ABDOMEN AFTER WEIGHT LOSS   Family History:  Family History  Problem Relation Age of Onset  . Diabetes Mother   . Hypertension Mother   . Depression Mother   . Diabetes Father   . Hypertension Father   . Anxiety disorder Father   . Depression Father   . ADD / ADHD Father   . Depression Sister   . Bipolar disorder Sister   . ADD / ADHD Brother    Family Psychiatric  History: She says her father had an anxiety problem and her mother has depression. Social History:  History  Alcohol Use No     History  Drug Use No    Social History   Social History  . Marital status: Single    Spouse name: N/A  . Number of children: N/A  . Years of education: N/A   Social History Main Topics  . Smoking status: Never Smoker  . Smokeless tobacco: Never Used  . Alcohol use No  . Drug use: No  . Sexual activity: Yes    Birth control/ protection: Condom   Other Topics Concern  . None   Social History Narrative  . None  Additional Social History:    Allergies:  No Known Allergies  Labs:  Results for orders placed or performed during the hospital encounter of 04/09/17 (from the past 48 hour(s))  Comprehensive metabolic panel     Status: Abnormal   Collection Time: 04/09/17  5:43 PM  Result Value Ref Range   Sodium 140 135 - 145 mmol/L   Potassium 3.2 (L) 3.5 - 5.1 mmol/L   Chloride 106 101 - 111 mmol/L   CO2 26 22 - 32 mmol/L   Glucose, Bld 100 (H) 65 - 99 mg/dL   BUN 15 6 - 20 mg/dL   Creatinine, Ser 0.86 0.44 - 1.00 mg/dL   Calcium 8.2 (L) 8.9 - 10.3 mg/dL   Total Protein 7.0 6.5 - 8.1 g/dL   Albumin 3.5 3.5 - 5.0 g/dL   AST 17 15 - 41 U/L   ALT 12 (L) 14 - 54 U/L   Alkaline Phosphatase 84 38 - 126 U/L   Total Bilirubin 0.3 0.3 - 1.2 mg/dL   GFR calc non Af Amer >60 >60 mL/min   GFR calc Af Amer >60 >60 mL/min     Comment: (NOTE) The eGFR has been calculated using the CKD EPI equation. This calculation has not been validated in all clinical situations. eGFR's persistently <60 mL/min signify possible Chronic Kidney Disease.    Anion gap 8 5 - 15  Ethanol     Status: None   Collection Time: 04/09/17  5:43 PM  Result Value Ref Range   Alcohol, Ethyl (B) <5 <5 mg/dL    Comment:        LOWEST DETECTABLE LIMIT FOR SERUM ALCOHOL IS 5 mg/dL FOR MEDICAL PURPOSES ONLY   Salicylate level     Status: None   Collection Time: 04/09/17  5:43 PM  Result Value Ref Range   Salicylate Lvl <9.5 2.8 - 30.0 mg/dL  Acetaminophen level     Status: Abnormal   Collection Time: 04/09/17  5:43 PM  Result Value Ref Range   Acetaminophen (Tylenol), Serum <10 (L) 10 - 30 ug/mL    Comment:        THERAPEUTIC CONCENTRATIONS VARY SIGNIFICANTLY. A RANGE OF 10-30 ug/mL MAY BE AN EFFECTIVE CONCENTRATION FOR MANY PATIENTS. HOWEVER, SOME ARE BEST TREATED AT CONCENTRATIONS OUTSIDE THIS RANGE. ACETAMINOPHEN CONCENTRATIONS >150 ug/mL AT 4 HOURS AFTER INGESTION AND >50 ug/mL AT 12 HOURS AFTER INGESTION ARE OFTEN ASSOCIATED WITH TOXIC REACTIONS.   cbc     Status: Abnormal   Collection Time: 04/09/17  5:43 PM  Result Value Ref Range   WBC 6.9 3.6 - 11.0 K/uL   RBC 4.14 3.80 - 5.20 MIL/uL   Hemoglobin 12.1 12.0 - 16.0 g/dL   HCT 35.6 35.0 - 47.0 %   MCV 86.0 80.0 - 100.0 fL   MCH 29.2 26.0 - 34.0 pg   MCHC 33.9 32.0 - 36.0 g/dL   RDW 15.5 (H) 11.5 - 14.5 %   Platelets 237 150 - 440 K/uL  Urine Drug Screen, Qualitative     Status: Abnormal   Collection Time: 04/09/17  5:43 PM  Result Value Ref Range   Tricyclic, Ur Screen NONE DETECTED NONE DETECTED   Amphetamines, Ur Screen NONE DETECTED NONE DETECTED   MDMA (Ecstasy)Ur Screen NONE DETECTED NONE DETECTED   Cocaine Metabolite,Ur Fidelis NONE DETECTED NONE DETECTED   Opiate, Ur Screen NONE DETECTED NONE DETECTED   Phencyclidine (PCP) Ur S NONE DETECTED NONE DETECTED    Cannabinoid 50 Ng, Ur Rosser NONE  DETECTED NONE DETECTED   Barbiturates, Ur Screen NONE DETECTED NONE DETECTED   Benzodiazepine, Ur Scrn POSITIVE (A) NONE DETECTED   Methadone Scn, Ur NONE DETECTED NONE DETECTED    Comment: (NOTE) 791  Tricyclics, urine               Cutoff 1000 ng/mL 200  Amphetamines, urine             Cutoff 1000 ng/mL 300  MDMA (Ecstasy), urine           Cutoff 500 ng/mL 400  Cocaine Metabolite, urine       Cutoff 300 ng/mL 500  Opiate, urine                   Cutoff 300 ng/mL 600  Phencyclidine (PCP), urine      Cutoff 25 ng/mL 700  Cannabinoid, urine              Cutoff 50 ng/mL 800  Barbiturates, urine             Cutoff 200 ng/mL 900  Benzodiazepine, urine           Cutoff 200 ng/mL 1000 Methadone, urine                Cutoff 300 ng/mL 1100 1200 The urine drug screen provides only a preliminary, unconfirmed 1300 analytical test result and should not be used for non-medical 1400 purposes. Clinical consideration and professional judgment should 1500 be applied to any positive drug screen result due to possible 1600 interfering substances. A more specific alternate chemical method 1700 must be used in order to obtain a confirmed analytical result.  1800 Gas chromato graphy / mass spectrometry (GC/MS) is the preferred 1900 confirmatory method.     No current facility-administered medications for this encounter.    Current Outpatient Prescriptions  Medication Sig Dispense Refill  . DULoxetine (CYMBALTA) 60 MG capsule Take 2 capsules (120 mg total) by mouth daily. 60 capsule 0  . fluconazole (DIFLUCAN) 150 MG tablet Take one tablet by mouth twice weekly for yeast 30 tablet 0  . FOLIC ACID PO Take 1 tablet by mouth daily. Reported on 08/30/2015    . gabapentin (NEURONTIN) 300 MG capsule Take 1 capsule (300 mg total) by mouth 2 (two) times daily. 60 capsule 0  . hydrochlorothiazide (MICROZIDE) 12.5 MG capsule TAKE 1 CAPSULE TWICE DAILY 180 capsule 1  . oxybutynin  (DITROPAN-XL) 5 MG 24 hr tablet Take 1 tablet (5 mg total) by mouth at bedtime. 90 tablet 0  . pregabalin (LYRICA) 200 MG capsule Take 1 capsule (200 mg total) by mouth 2 (two) times daily. 60 capsule 0  . topiramate (TOPAMAX) 50 MG tablet Take 1 tablet (50 mg total) by mouth daily. (Patient taking differently: Take 100 mg by mouth daily. ) 30 tablet 0  . traZODone (DESYREL) 100 MG tablet Take 1 tablet (100 mg total) by mouth at bedtime. 30 tablet 1  . Vitamin D, Ergocalciferol, (DRISDOL) 50000 units CAPS capsule Take 50,000 Units by mouth every 7 (seven) days.    . Alcohol Swabs (B-D SINGLE USE SWABS REGULAR) PADS Use to cleanse skin once daily before checking blood sugar 100 each 1  . ARIPiprazole (ABILIFY) 5 MG tablet Take 1 tablet (5 mg total) by mouth daily. (Patient not taking: Reported on 04/10/2017) 30 tablet 0  . Blood Glucose Calibration (ADVOCATE CONTROL SOLUTION) High LIQD Check blood glucose monitoring device as directed by manual 1 each 0  .  glucose blood test strip Check blood sugar once daily 100 each 12  . Lancets 28G MISC Check blood sugar once daily 100 each 5  . medroxyPROGESTERone (PROVERA) 10 MG tablet Take one tablet daily for 10 days every 30 days if no spontaneous menses and negative home UPT (Patient not taking: Reported on 04/10/2017) 30 tablet 4    Musculoskeletal: Strength & Muscle Tone: within normal limits Gait & Station: normal Patient leans: N/A  Psychiatric Specialty Exam: Physical Exam  Nursing note and vitals reviewed. Constitutional: She appears well-developed and well-nourished.  HENT:  Head: Normocephalic and atraumatic.  Eyes: Pupils are equal, round, and reactive to light. Conjunctivae are normal.  Neck: Normal range of motion.  Cardiovascular: Regular rhythm and normal heart sounds.   Respiratory: Effort normal. No respiratory distress.  GI: Soft.  Musculoskeletal: Normal range of motion.  Neurological: She is alert.  Skin: Skin is warm and dry.   Psychiatric: Her speech is normal. Her affect is blunt. She is not agitated and not aggressive. Cognition and memory are impaired. She expresses inappropriate judgment. She expresses suicidal ideation. She expresses no suicidal plans.    Review of Systems  Constitutional: Negative.   HENT: Negative.   Eyes: Negative.   Respiratory: Negative.   Cardiovascular: Negative.   Gastrointestinal: Negative.   Musculoskeletal: Negative.   Skin: Negative.   Neurological: Negative.   Psychiatric/Behavioral: Positive for depression, hallucinations and suicidal ideas. Negative for memory loss and substance abuse. The patient is nervous/anxious and has insomnia.     Blood pressure 114/72, pulse 61, temperature 98.3 F (36.8 C), temperature source Oral, resp. rate 20, weight 118.8 kg (262 lb), last menstrual period 04/07/2017, SpO2 99 %.Body mass index is 41.04 kg/m.  General Appearance: Fairly Groomed  Eye Contact:  Good  Speech:  Slow  Volume:  Decreased  Mood:  Depressed and Dysphoric  Affect:  Constricted  Thought Process:  Goal Directed  Orientation:  Full (Time, Place, and Person)  Thought Content:  Logical  Suicidal Thoughts:  Yes.  with intent/plan  Homicidal Thoughts:  No  Memory:  Immediate;   Poor Recent;   Fair Remote;   Fair The memory testing on this patient was actually, I think, revealing. The patient could not repeat my 3 simple words despite my giving her several tries. She would repeat to me 1 word and then claim that she could not remember the others. Given that she was able to have a lucid conversation with memory of a lot of other details otherwise, that there is no evidence of her responding to internal stimuli this, to me, strongly suggest some malingering. The ability to repeat the 3 words immediately is almost universally preserved even in people with fairly advanced dementia.  Judgement:  Fair  Insight:  Fair  Psychomotor Activity:  Decreased  Concentration:   Concentration: Fair  Recall:  [employer] of Knowledge:  Fair  Language:  Fair  Akathisia:  No  Handed:  Right  AIMS (if indicated):     Assets:  Communication Skills Desire for Improvement Housing Physical Health Resilience  ADL's:  Intact  Cognition:  WNL  Sleep:        Treatment Plan Summary: Daily contact with patient to assess and evaluate symptoms and progress in treatment, Medication management and Plan 46 year old woman with a history of mood symptoms who is voicing suicidal ideation. Patient appears to be mildly depressed. I am a little concerned however by the fact that she goes on at  some length about how important Xanax and Adderall are to her even though there is no evidence that she has been prescribed either of those medicines in a year or more probably more. Also that I think she may be exaggerating some of her symptoms. As I was leaving the room and after I had told her that I would admit her to the hospital the patient told me that she needed to tell me about her history of multiple personality. She proceeded to lay out some details about her several "personalities". Patient will be admitted to the hospital. Return to medications with the Cymbalta and Topamax. Treatment blood pressure if needed. Full set of labs.  Disposition: Recommend psychiatric Inpatient admission when medically cleared. Supportive therapy provided about ongoing stressors.  Alethia Berthold, MD 04/10/2017 1:53 PM

## 2017-04-10 NOTE — BH Assessment (Signed)
Patient has been accepted to [facility].  Patient assigned to room TBD Accepting physician is Dr. Selinda Flavin.  Call report to [phone].  Representative was [employer].  ER Staff is aware of it (Ethel Patient's Nurse)     The patient can arrive any time after 10AM

## 2017-04-10 NOTE — ED Notes (Signed)
PT IVC/ PENDING PLACEMENT  

## 2017-04-11 ENCOUNTER — Encounter: Payer: Self-pay | Admitting: Psychiatry

## 2017-04-11 DIAGNOSIS — G894 Chronic pain syndrome: Secondary | ICD-10-CM | POA: Diagnosis present

## 2017-04-11 DIAGNOSIS — F4310 Post-traumatic stress disorder, unspecified: Secondary | ICD-10-CM | POA: Diagnosis present

## 2017-04-11 DIAGNOSIS — E1159 Type 2 diabetes mellitus with other circulatory complications: Secondary | ICD-10-CM | POA: Diagnosis present

## 2017-04-11 DIAGNOSIS — S069XAA Unspecified intracranial injury with loss of consciousness status unknown, initial encounter: Secondary | ICD-10-CM | POA: Diagnosis present

## 2017-04-11 DIAGNOSIS — I10 Essential (primary) hypertension: Secondary | ICD-10-CM | POA: Diagnosis present

## 2017-04-11 DIAGNOSIS — S069X9A Unspecified intracranial injury with loss of consciousness of unspecified duration, initial encounter: Secondary | ICD-10-CM | POA: Diagnosis present

## 2017-04-11 DIAGNOSIS — F429 Obsessive-compulsive disorder, unspecified: Secondary | ICD-10-CM | POA: Diagnosis present

## 2017-04-11 DIAGNOSIS — F315 Bipolar disorder, current episode depressed, severe, with psychotic features: Principal | ICD-10-CM

## 2017-04-11 DIAGNOSIS — F4001 Agoraphobia with panic disorder: Secondary | ICD-10-CM | POA: Diagnosis present

## 2017-04-11 DIAGNOSIS — R45851 Suicidal ideations: Secondary | ICD-10-CM

## 2017-04-11 LAB — LIPID PANEL
CHOL/HDL RATIO: 6.1 ratio
Cholesterol: 202 mg/dL — ABNORMAL HIGH (ref 0–200)
HDL: 33 mg/dL — AB (ref >40)
LDL CALC: 139 mg/dL — AB (ref 0–99)
TRIGLYCERIDES: 149 mg/dL (ref <150)
VLDL: 30 mg/dL (ref 0–40)

## 2017-04-11 LAB — HEMOGLOBIN A1C
Hgb A1c MFr Bld: 6.1 % — ABNORMAL HIGH (ref 4.8–5.6)
Mean Plasma Glucose: 128.37 mg/dL

## 2017-04-11 LAB — TSH: TSH: 2.237 u[IU]/mL (ref 0.350–4.500)

## 2017-04-11 MED ORDER — HYDROXYZINE HCL 25 MG PO TABS
25.0000 mg | ORAL_TABLET | Freq: Three times a day (TID) | ORAL | Status: DC | PRN
  Administered 2017-04-11 – 2017-04-14 (×7): 25 mg via ORAL
  Filled 2017-04-11 (×8): qty 1

## 2017-04-11 MED ORDER — TRAZODONE HCL 100 MG PO TABS
100.0000 mg | ORAL_TABLET | Freq: Every day | ORAL | Status: DC
  Administered 2017-04-11 – 2017-04-12 (×2): 100 mg via ORAL
  Filled 2017-04-11 (×2): qty 1

## 2017-04-11 MED ORDER — HYDROCHLOROTHIAZIDE 25 MG PO TABS
25.0000 mg | ORAL_TABLET | Freq: Every day | ORAL | Status: DC
  Administered 2017-04-11 – 2017-04-16 (×6): 25 mg via ORAL
  Filled 2017-04-11 (×6): qty 1

## 2017-04-11 MED ORDER — FLUVOXAMINE MALEATE 50 MG PO TABS
50.0000 mg | ORAL_TABLET | Freq: Every day | ORAL | Status: DC
  Administered 2017-04-11 – 2017-04-13 (×3): 50 mg via ORAL
  Filled 2017-04-11 (×3): qty 1

## 2017-04-11 MED ORDER — OXYBUTYNIN CHLORIDE ER 5 MG PO TB24
5.0000 mg | ORAL_TABLET | Freq: Every day | ORAL | Status: DC
  Administered 2017-04-11 – 2017-04-15 (×5): 5 mg via ORAL
  Filled 2017-04-11 (×6): qty 1

## 2017-04-11 MED ORDER — GABAPENTIN 600 MG PO TABS
300.0000 mg | ORAL_TABLET | Freq: Three times a day (TID) | ORAL | Status: DC
  Administered 2017-04-11 – 2017-04-14 (×10): 300 mg via ORAL
  Filled 2017-04-11 (×10): qty 1

## 2017-04-11 MED ORDER — PREGABALIN 50 MG PO CAPS
200.0000 mg | ORAL_CAPSULE | Freq: Two times a day (BID) | ORAL | Status: DC
  Administered 2017-04-11 – 2017-04-16 (×11): 200 mg via ORAL
  Filled 2017-04-11 (×11): qty 4

## 2017-04-11 MED ORDER — MEDROXYPROGESTERONE ACETATE 2.5 MG PO TABS
10.0000 mg | ORAL_TABLET | Freq: Every day | ORAL | Status: DC
Start: 2017-04-11 — End: 2017-04-16
  Administered 2017-04-11 – 2017-04-16 (×6): 10 mg via ORAL
  Filled 2017-04-11 (×3): qty 4
  Filled 2017-04-11: qty 1
  Filled 2017-04-11 (×2): qty 4

## 2017-04-11 MED ORDER — IBUPROFEN 600 MG PO TABS
600.0000 mg | ORAL_TABLET | Freq: Four times a day (QID) | ORAL | Status: DC | PRN
  Administered 2017-04-14: 600 mg via ORAL
  Filled 2017-04-11: qty 1

## 2017-04-11 MED ORDER — TOPIRAMATE 100 MG PO TABS
200.0000 mg | ORAL_TABLET | Freq: Every day | ORAL | Status: DC
  Administered 2017-04-11 – 2017-04-15 (×5): 200 mg via ORAL
  Filled 2017-04-11 (×5): qty 2

## 2017-04-11 NOTE — Progress Notes (Signed)
Patient isolates in her room today.Did not attend group activities.Patient states "I have social anxiety I don't like groups."Patients affect brightens upon approach.Denies suicidal ideations.Positive for hearing voices states "I can hear some whispers."Compliant with medications.Appetite poor.Support & encouragement given.

## 2017-04-11 NOTE — BHH Group Notes (Signed)
[facility] Group Notes:  (Nursing/MHT/Case Management/Adjunct)  Date:  04/11/2017  Time:  4:19 PM  Type of Therapy:  Psychoeducational Skills  Participation Level:  Did Not Attend  Charise Killian 04/11/2017, 4:19 PM

## 2017-04-11 NOTE — BHH Group Notes (Signed)
[facility] Group Notes:  (Nursing/MHT/Case Management/Adjunct)  Date:  04/11/2017  Time:  9:36 PM  Type of Therapy:  Group Therapy  Participation Level:  Active  Participation Quality:  Appropriate  Affect:  Appropriate  Cognitive:  Alert  Insight:  Good  Engagement in Group:  Engaged  Modes of Intervention:  Support  Summary of Progress/Problems:  Barbara Anderson 04/11/2017, 9:36 PM
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
## 2017-04-11 NOTE — Progress Notes (Signed)
Patient ID: Barbara Anderson, female   DOB: 1970-09-13, 46 y.o.   MRN: [id number] LCSW Group Therapy Note 04/11/2017 9am  Type of Therapy and Topic:  Group Therapy:  Setting Goals  Participation Level:  Did Not Attend  Description of Group: In this process group, patients discussed using strengths to work toward goals and address challenges.  Patients identified two positive things about themselves and one goal they were working on.  Patients were given the opportunity to share openly and support each other's plan for self-empowerment.  The group discussed the value of gratitude and were encouraged to have a daily reflection of positive characteristics or circumstances.  Patients were encouraged to identify a plan to utilize their strengths to work on current challenges and goals.  Therapeutic Goals 1. Patient will verbalize personal strengths/positive qualities and relate how these can assist with achieving desired personal goals 2. Patients will verbalize affirmation of peers plans for personal change and goal setting 3. Patients will explore the value of gratitude and positive focus as related to successful achievement of goals 4. Patients will verbalize a plan for regular reinforcement of personal positive qualities and circumstances.  Summary of Patient Progress:       Therapeutic Modalities Cognitive Behavioral Therapy Motivational Interviewing    August Saucer, LCSW 04/11/2017 11:11 AM
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
## 2017-04-11 NOTE — Plan of Care (Signed)
Problem: Coping: Goal: Ability to cope will improve Outcome: Progressing Patient is depressed but denies any suicidal thoughts.  Problem: Safety: Goal: Ability to identify and utilize support systems that promote safety will improve Outcome: Progressing Patient remained safe in the unit.  Problem: Activity: Goal: Sleeping patterns will improve Outcome: Not Progressing Patient is isolative and didn not attend any group activities.

## 2017-04-11 NOTE — Progress Notes (Signed)
Patient ID: Barbara Anderson, female   DOB: 01-29-1971, 46 y.o.   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 She ambulates with a rolling walker.

## 2017-04-11 NOTE — Progress Notes (Signed)
Patient ID: Barbara Anderson, female   DOB: Jul 15, 1971, 46 y.o.   MRN: [id number] LCSW Group Therapy Note  04/11/2017 9:30am  Type of Therapy/Topic:  Group Therapy:  Balance in Life  Participation Level:  Did Not Attend  Description of Group:    This group will address the concept of balance and how it feels and looks when one is unbalanced. Patients will be encouraged to process areas in their lives that are out of balance and identify reasons for remaining unbalanced. Facilitators will guide patients in utilizing problem-solving interventions to address and correct the stressor making their life unbalanced. Understanding and applying boundaries will be explored and addressed for obtaining and maintaining a balanced life. Patients will be encouraged to explore ways to assertively make their unbalanced needs known to significant others in their lives, using other group members and facilitator for support and feedback.  Therapeutic Goals: 1. Patient will identify two or more emotions or situations they have that consume much of in their lives. 2. Patient will identify signs/triggers that life has become out of balance:  3. Patient will identify two ways to set boundaries in order to achieve balance in their lives:  4. Patient will demonstrate ability to communicate their needs through discussion and/or role plays  Summary of Patient Progress:      Therapeutic Modalities:   Cognitive Behavioral Therapy Solution-Focused Therapy Assertiveness Training  August Saucer, LCSW 04/11/2017 11:11 AM

## 2017-04-11 NOTE — Plan of Care (Signed)
Problem: Activity: Goal: Sleeping patterns will improve Outcome: Progressing Patient slept for Estimated Hours of 7.30; Precautionary checks every 15 minutes for safety maintained, room free of safety hazards, patient sustains no injury or falls during this shift.    

## 2017-04-12 NOTE — BHH Group Notes (Signed)
[place] LCSW Group Therapy Note  Date/Time: 04/12/17, 0930  Type of Therapy and Topic:  Group Therapy:  Feelings around Relapse and Recovery  Participation Level:  Minimal   Mood: pleasant  Description of Group:    Patients in this group will discuss emotions they experience before and after a relapse. They will process how experiencing these feelings, or avoidance of experiencing them, relates to having a relapse. Facilitator will guide patients to explore emotions they have related to recovery. Patients will be encouraged to process which emotions are more powerful. They will be guided to discuss the emotional reaction significant others in their lives may have to patients' relapse or recovery. Patients will be assisted in exploring ways to respond to the emotions of others without this contributing to a relapse.  Therapeutic Goals: 1. Patient will identify two or more emotions that lead to relapse for them:  2. Patient will identify two emotions that result when they relapse:  3. Patient will identify two emotions related to recovery:  4. Patient will demonstrate ability to communicate their needs through discussion and/or role plays.   Summary of Patient Progress: Pt identified lonliness as a difficult emotion for her to deal with.  Pt shared some with the group about alcohol use leading to her current admission but denied that addiction is a primary issue for her.     Therapeutic Modalities:   Cognitive Behavioral Therapy Solution-Focused Therapy Assertiveness Training Relapse Prevention Therapy  Lurline Idol, LCSW

## 2017-04-12 NOTE — Progress Notes (Signed)
[facility] MD Progress Note  04/12/2017 10:26 AM Barbara Anderson  MRN:  [id number]  Subjective:   Ms. Facer is a 46 year old female with a history of depression and chronic pain admitted for worsening of symptoms, hallucinations and suicidal ideation in the context of medication noncompliance.  04/12/2017. Ms. Pitner met with treatment team today. She moves about using a walker. She complains of depression and severe anxiety. For the first time she discloses that she has not been able to see Dr. Robina Ade in 7 months or more. She pays out-of-pocket and has not been able to afford her visits. She told me that Dr. Robina Ade describes her Lyrica. It is unclear at this point whether the patient had access to Lyrica which we restarted in the hospital. She also tells me that Dr. Robina Ade prescribed Xanax and Adderall. She complains that all other medications have been prescribed by her primary care provider. We cannot confirm as she uses mail in pharmacy. The patient complains of fibromyalgia and back pain. She uses walker at home. I'm going to ask physical therapy to assess the need for walker.  Per nursing: Patient affect and mood are flat and depressed.  Patient interaction with staff is minimal.  No complaints noted.  Patient cooperative and took all HS medications. Patient slept 7hr 33mn.   Principal Problem: Bipolar I disorder, current or most recent episode depressed, with psychotic features ([facility] Diagnosis:   Patient Active Problem List   Diagnosis Date Noted  . Suicidal ideation [R45.851] 04/11/2017  . HTN (hypertension) [I10] 04/11/2017  . Chronic pain syndrome [G89.4] 04/11/2017  . PTSD (post-traumatic stress disorder) [F43.10] 04/11/2017  . OCD (obsessive compulsive disorder) [F42.9] 04/11/2017  . 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 TBI (traumatic brain injury) ([place] [S06.9X9A] 04/11/2017  . Bipolar I disorder, current or most recent episode depressed, with psychotic features ([facility] [F31.5]  04/10/2017  . Difficulty concentrating [R41.840] 11/24/2015  . Abnormal bruising [R23.8] 10/26/2015  . Accumulation of fluid in tissues [R60.9] 10/26/2015  . Fibrositis [M79.7] 10/26/2015  . Cervical pain [M54.2] 10/26/2015  . Adiposity [E66.9] 10/26/2015  . 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 Oligomenorrhea [N91.5] 08/30/2015  . Weight gain [R63.5] 08/30/2015  . Costochondritis [M94.0] 04/14/2015  . Dyspnea [R06.00] 04/14/2015  . Recurrent vaginitis [N76.0]   . Clinical depression [F32.9] 09/21/2008  . Late effect of certain complications of trauma [[X919XXS] 09/21/2008  . Anal bleeding [K62.5] 09/10/2008  . 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  Past Medical History:  Past Medical History:  Diagnosis Date  . ADD (attention deficit disorder)   . Anxiety   . Concussion 2001   head trauma-work related. Caused headaches and short term memory loss.  . Depression   . Overactive bladder   . Recurrent vaginitis     Past Surgical History:  Procedure Laterality Date  . ankle pin and plates placement     right  . COSMETIC SURGERY  2005   BREASTS, ARM, ABDOMEN AFTER WEIGHT LOSS   Family History:  Family History  Problem Relation Age of Onset  . Diabetes Mother   . Hypertension Mother   . Depression Mother   . Diabetes Father   . Hypertension Father   . Anxiety disorder Father   . Depression Father   . ADD / ADHD Father   . Depression Sister   . Bipolar disorder Sister   . ADD / ADHD  Brother    Family Psychiatric  History: see H&P. Social History:  History  Alcohol Use No     History  Drug Use No    Social History   Social History  . Marital status: Single    Spouse name: N/A  . Number of children: N/A  . Years of education: N/A   Social History Main Topics  . Smoking status: Never Smoker  . Smokeless tobacco: Never Used  .  Alcohol use No  . Drug use: No  . Sexual activity: Yes    Birth control/ protection: Condom   Other Topics Concern  . None   Social History Narrative  . None   Additional Social History:                         Sleep: Fair  Appetite:  Fair  Current Medications: Current Facility-Administered Medications  Medication Dose Route Frequency Provider Last Rate Last Dose  . acetaminophen (TYLENOL) tablet 650 mg  650 mg Oral Q6H PRN Clapacs, Madie Reno, MD   650 mg at 04/11/17 0946  . alum & mag hydroxide-simeth (MAALOX/MYLANTA) 200-200-20 MG/5ML suspension 30 mL  30 mL Oral Q4H PRN Clapacs, John T, MD      . DULoxetine (CYMBALTA) DR capsule 60 mg  60 mg Oral BID Clapacs, Madie Reno, MD   60 mg at 04/12/17 9480  . fluvoxaMINE (LUVOX) tablet 50 mg  50 mg Oral QHS Willisha Sligar B, MD   50 mg at 04/11/17 2109  . gabapentin (NEURONTIN) tablet 300 mg  300 mg Oral TID Nysha Koplin B, MD   300 mg at 04/12/17 0833  . hydrochlorothiazide (HYDRODIURIL) tablet 25 mg  25 mg Oral Daily Babara Buffalo B, MD   25 mg at 04/12/17 0832  . hydrOXYzine (ATARAX/VISTARIL) tablet 25 mg  25 mg Oral TID PRN Tiarra Anastacio B, MD   25 mg at 04/12/17 0832  . ibuprofen (ADVIL,MOTRIN) tablet 600 mg  600 mg Oral Q6H PRN Jereline Ticer B, MD      . magnesium hydroxide (MILK OF MAGNESIA) suspension 30 mL  30 mL Oral Daily PRN Clapacs, John T, MD      . medroxyPROGESTERone (PROVERA) tablet 10 mg  10 mg Oral Daily Jazlene Bares B, MD   10 mg at 04/12/17 0832  . oxybutynin (DITROPAN-XL) 24 hr tablet 5 mg  5 mg Oral QHS Quaid Yeakle B, MD   5 mg at 04/11/17 2109  . pregabalin (LYRICA) capsule 200 mg  200 mg Oral BID Detavious Rinn B, MD   200 mg at 04/12/17 0832  . topiramate (TOPAMAX) tablet 200 mg  200 mg Oral QHS Baltazar Pekala B, MD   200 mg at 04/11/17 2109  . traZODone (DESYREL) tablet 100 mg  100 mg Oral QHS Adira Limburg B, MD   100 mg at 04/11/17 2109    Lab  Results:  Results for orders placed or performed during the hospital encounter of 04/10/17 (from the past 48 hour(s))  Hemoglobin A1c     Status: Abnormal   Collection Time: 04/11/17  7:02 AM  Result Value Ref Range   Hgb A1c MFr Bld 6.1 (H) 4.8 - 5.6 %    Comment: (NOTE) Pre diabetes:          5.7%-6.4% Diabetes:              >6.4% Glycemic control for   <7.0% adults with diabetes    Mean Plasma  Glucose 128.37 mg/dL    Comment: Performed at [facility], [place] [address] [place], [place] [address]  Lipid panel     Status: Abnormal   Collection Time: 04/11/17  7:02 AM  Result Value Ref Range   Cholesterol 202 (H) 0 - 200 mg/dL   Triglycerides 149 <150 mg/dL   HDL 33 (L) >40 mg/dL   Total CHOL/HDL Ratio 6.1 RATIO   VLDL 30 0 - 40 mg/dL   LDL Cholesterol 139 (H) 0 - 99 mg/dL    Comment:        Total Cholesterol/HDL:CHD Risk Coronary Heart Disease Risk Table                     Men   Women  1/2 Average Risk   3.4   3.3  Average Risk       5.0   4.4  2 X Average Risk   9.6   7.1  3 X Average Risk  23.4   11.0        Use the calculated Patient Ratio above and the CHD Risk Table to determine the patient's CHD Risk.        ATP III CLASSIFICATION (LDL):  <100     mg/dL   Optimal  100-129  mg/dL   Near or Above                    Optimal  130-159  mg/dL   Borderline  160-189  mg/dL   High  >190     mg/dL   Very High   TSH     Status: None   Collection Time: 04/11/17  7:02 AM  Result Value Ref Range   TSH 2.237 0.350 - 4.500 uIU/mL    Comment: Performed by a 3rd Generation assay with a functional sensitivity of <=0.01 uIU/mL.    Blood Alcohol level:  Lab Results  Component Value Date   ETH <5 70/35/0093    Metabolic Disorder Labs: Lab Results  Component Value Date   HGBA1C 6.1 (H) 04/11/2017   MPG 128.37 04/11/2017   Lab Results  Component Value Date   PROLACTIN 9.5 08/30/2015   PROLACTIN 7.7 07/02/2013   Lab Results  Component Value Date   CHOL 202  (H) 04/11/2017   TRIG 149 04/11/2017   HDL 33 (L) 04/11/2017   CHOLHDL 6.1 04/11/2017   VLDL 30 04/11/2017   LDLCALC 139 (H) 04/11/2017   LDLCALC 105 (H) 09/24/2011    Physical Findings: AIMS: Facial and Oral Movements Muscles of Facial Expression: None, normal Lips and Perioral Area: None, normal Jaw: None, normal Tongue: None, normal,Extremity Movements Upper (arms, wrists, hands, fingers): None, normal Lower (legs, knees, ankles, toes): None, normal, Trunk Movements Neck, shoulders, hips: None, normal, Overall Severity Severity of abnormal movements (highest score from questions above): None, normal Incapacitation due to abnormal movements: None, normal Patient's awareness of abnormal movements (rate only patient's report): No Awareness, Dental Status Current problems with teeth and/or dentures?: No Does patient usually wear dentures?: No  CIWA:    COWS:     Musculoskeletal: Strength & Muscle Tone: within normal limits Gait & Station: normal Patient leans: N/A  Psychiatric Specialty Exam: Physical Exam  Nursing note and vitals reviewed. Psychiatric: Her speech is normal. Her mood appears anxious. She is slowed and withdrawn. Cognition and memory are normal. She expresses impulsivity. She exhibits a depressed mood. She expresses suicidal ideation. She expresses suicidal plans.  Review of Systems  Musculoskeletal: Positive for back pain, falls and myalgias.  Psychiatric/Behavioral: Positive for depression and suicidal ideas. The patient is nervous/anxious and has insomnia.   All other systems reviewed and are negative.   Blood pressure 106/71, pulse 67, temperature 98.4 F (36.9 C), temperature source Oral, resp. rate 18, height '5\' 7"'  (1.702 m), weight 113.6 kg (250 lb 8 oz), last menstrual period 04/07/2017, SpO2 100 %.Body mass index is 39.23 kg/m.  General Appearance: Casual  Eye Contact:  Good  Speech:  Clear and Coherent  Volume:  Normal  Mood:  Anxious and  Depressed  Affect:  Blunt  Thought Process:  Goal Directed and Descriptions of Associations: Intact  Orientation:  Full (Time, Place, and Person)  Thought Content:  Hallucinations: Auditory Visual  Suicidal Thoughts:  Yes.  with intent/plan  Homicidal Thoughts:  No  Memory:  Immediate;   Fair Recent;   Fair Remote;   Fair  Judgement:  Poor  Insight:  Shallow  Psychomotor Activity:  Psychomotor Retardation  Concentration:  Concentration: Fair and Attention Span: Fair  Recall:  [employer] of Knowledge:  Fair  Language:  Fair  Akathisia:  No  Handed:  Right  AIMS (if indicated):     Assets:  Communication Skills Desire for Improvement Financial Resources/Insurance Housing Resilience Social Support  ADL's:  Intact  Cognition:  WNL  Sleep:  Number of Hours: 7.25     Treatment Plan Summary: Daily contact with patient to assess and evaluate symptoms and progress in treatment and Medication management   Ms. Wnuk is a 46 year old female with a history of bipolar disorder and chronic pain admitted for worsening of depression and suicidal ideation in the context of treatment noncompliance.  1. Suicidal ideation. The patient is able to contract for safety in the hospital.  2. Mood. We restarted Cymbalta and Neurontin and increased Topamax to 200 mg.  3. Anxiety. The patient reports panic attacks, severe social anxiety with agoraphobia, nightmares and flashbacks of PTSD, and OCD symptoms. Started Luvox.   4. Chronic pain. She is on Lyrica, Neurontin, and Cymbalta. She takes ibuprofen as needed.  5. Insomnia. Slept better with Luvox.   6. Hypertension. Hydrochlorothiazide is available.  7. Urinary incontinence. She is on Ditropan.  8. Prediabetes. She is on an ADA diet.  9. Metabolic syndrome monitoring. Lipid panel shows elevated cholesterol and triglycerides, normal TSH, and hemoglobin A1c of 6.1.   10. EKG. Normal sinus rhythm, QTc 439.   11. Disposition.  She will be discharge to her apartment. She will follow up with Dr. Robina Ade.   Orson Slick, MD 04/12/2017, 10:26 AM

## 2017-04-12 NOTE — Plan of Care (Signed)
Problem: Coping: Goal: Ability to verbalize feelings will improve Outcome: Progressing Quiet and reserved.  Isolates to room.  Verbalizes feeling when engaged.

## 2017-04-12 NOTE — BHH Group Notes (Signed)
[place] Group Notes:  (Nursing/MHT/Case Management/Adjunct)  Date:  04/12/2017  Time:  2:15 PM  Type of Therapy:  Psychoeducational Skills  Participation Level:  Active  Participation Quality:  Appropriate, Attentive and Supportive  Affect:  Appropriate  Cognitive:  Appropriate  Insight:  Appropriate  Engagement in Group:  Engaged  Modes of Intervention:  Activity, Discussion, Education and Support  Summary of Progress/Problems:  Barbara Anderson 04/12/2017, 2:15 PM

## 2017-04-12 NOTE — Progress Notes (Signed)
Patient affect and mood are flat and depressed.  Patient interaction with staff is minimal.  No complaints noted.  Patient cooperative and took all HS medications. Patient slept 7hr 26min.

## 2017-04-12 NOTE — BHH Suicide Risk Assessment (Signed)
[place] INPATIENT:  Family/Significant Other Suicide Prevention Education  Suicide Prevention Education:  Contact Attempts: Barbara Anderson [phone], (name of family member/significant other) has been identified by the patient as the family member/significant other with whom the patient will be residing, and identified as the person(s) who will aid the patient in the event of a mental health crisis.  With written consent from the patient, two attempts were made to provide suicide prevention education, prior to and/or following the patient's discharge.  We were unsuccessful in providing suicide prevention education.  A suicide education pamphlet was given to the patient to share with family/significant other.  Date and time of first attempt:04/12/2017   10:06 AM (message left as voice mail confirmed name above: Barbara Anderson) Date and time of second attempt:  Barbara Anderson 04/12/2017, 10:06 AM

## 2017-04-12 NOTE — Progress Notes (Signed)
Remains depressed,  Affect sad although brightens with engagement.   Medication compliant, support and encouragement offered. No behavioral issues to report on shift at this time.  She appears to be resting in bed at this time.

## 2017-04-12 NOTE — BHH Group Notes (Signed)
[place] LCSW Group Therapy Note  Date/Time: 04/12/17, 0930  Type of Therapy and Topic:  Group Therapy:  Feelings around Relapse and Recovery  Participation Level:  Minimal   Mood:pleasant  Description of Group:    Patients in this group will discuss emotions they experience before and after a relapse. They will process how experiencing these feelings, or avoidance of experiencing them, relates to having a relapse. Facilitator will guide patients to explore emotions they have related to recovery. Patients will be encouraged to process which emotions are more powerful. They will be guided to discuss the emotional reaction significant others in their lives may have to patients' relapse or recovery. Patients will be assisted in exploring ways to respond to the emotions of others without this contributing to a relapse.  Therapeutic Goals: 1. Patient will identify two or more emotions that lead to relapse for them:  2. Patient will identify two emotions that result when they relapse:  3. Patient will identify two emotions related to recovery:  4. Patient will demonstrate ability to communicate their needs through discussion and/or role plays.   Summary of Patient Progress: PT did not report any issues with substance use relapse and was mostly quiet during group.  She did identify loneliness as a difficult emotion for her to deal with.  She was attentive to the group discussion.     Therapeutic Modalities:   Cognitive Behavioral Therapy Solution-Focused Therapy Assertiveness Training Relapse Prevention Therapy  Lurline Idol, LCSW

## 2017-04-12 NOTE — Progress Notes (Signed)
Rates depression, anxiety and hopelessness as 5/10.  Affect sad although brightens with engagement.  Verbalized goal is to feel better.  Discussed coping skills.  Scheduled medications given.  Support offered.  Safety rounds maintained.

## 2017-04-12 NOTE — Tx Team (Signed)
Interdisciplinary Treatment and Diagnostic Plan Update  04/12/2017 Time of Session: [place] MRN: [id number]  Principal Diagnosis: Bipolar I disorder, current or most recent episode depressed, with psychotic features ([place])  Secondary Diagnoses: Principal Problem:   Bipolar I disorder, current or most recent episode depressed, with psychotic features ([place]) Active Problems:   Suicidal ideation   HTN (hypertension)   Chronic pain syndrome   PTSD (post-traumatic stress disorder)   OCD (obsessive compulsive disorder)   Panic disorder with agoraphobia   TBI (traumatic brain injury) ([place])   Current Medications:  Current Facility-Administered Medications  Medication Dose Route Frequency Provider Last Rate Last Dose  . acetaminophen (TYLENOL) tablet 650 mg  650 mg Oral Q6H PRN Clapacs, Madie Reno, MD   650 mg at 04/11/17 0946  . alum & mag hydroxide-simeth (MAALOX/MYLANTA) 200-200-20 MG/5ML suspension 30 mL  30 mL Oral Q4H PRN Clapacs, John T, MD      . DULoxetine (CYMBALTA) DR capsule 60 mg  60 mg Oral BID Clapacs, Madie Reno, MD   60 mg at 04/12/17 7209  . fluvoxaMINE (LUVOX) tablet 50 mg  50 mg Oral QHS Pucilowska, Jolanta B, MD   50 mg at 04/11/17 2109  . gabapentin (NEURONTIN) tablet 300 mg  300 mg Oral TID Pucilowska, Jolanta B, MD   300 mg at 04/12/17 1216  . hydrochlorothiazide (HYDRODIURIL) tablet 25 mg  25 mg Oral Daily Pucilowska, Jolanta B, MD   25 mg at 04/12/17 0832  . hydrOXYzine (ATARAX/VISTARIL) tablet 25 mg  25 mg Oral TID PRN Pucilowska, Jolanta B, MD   25 mg at 04/12/17 1255  . ibuprofen (ADVIL,MOTRIN) tablet 600 mg  600 mg Oral Q6H PRN Pucilowska, Jolanta B, MD      . magnesium hydroxide (MILK OF MAGNESIA) suspension 30 mL  30 mL Oral Daily PRN Clapacs, John T, MD      . medroxyPROGESTERone (PROVERA) tablet 10 mg  10 mg Oral Daily Pucilowska, Jolanta B, MD   10 mg at 04/12/17 0832  . oxybutynin (DITROPAN-XL) 24 hr tablet 5 mg  5 mg Oral QHS Pucilowska, Jolanta B, MD   5  mg at 04/11/17 2109  . pregabalin (LYRICA) capsule 200 mg  200 mg Oral BID Pucilowska, Jolanta B, MD   200 mg at 04/12/17 0832  . topiramate (TOPAMAX) tablet 200 mg  200 mg Oral QHS Pucilowska, Jolanta B, MD   200 mg at 04/11/17 2109  . traZODone (DESYREL) tablet 100 mg  100 mg Oral QHS Pucilowska, Jolanta B, MD   100 mg at 04/11/17 2109   PTA Medications: Prescriptions Prior to Admission  Medication Sig Dispense Refill Last Dose  . Alcohol Swabs (B-D SINGLE USE SWABS REGULAR) PADS Use to cleanse skin once daily before checking blood sugar 100 each 1   . ARIPiprazole (ABILIFY) 5 MG tablet Take 1 tablet (5 mg total) by mouth daily. (Patient not taking: Reported on 04/10/2017) 30 tablet 0 Not Taking at Unknown time  . Blood Glucose Calibration (ADVOCATE CONTROL SOLUTION) High LIQD Check blood glucose monitoring device as directed by manual 1 each 0   . DULoxetine (CYMBALTA) 60 MG capsule Take 2 capsules (120 mg total) by mouth daily. 60 capsule 0 04/09/2017 at 0800  . fluconazole (DIFLUCAN) 150 MG tablet Take one tablet by mouth twice weekly for yeast 30 tablet 0 04/07/2017 at 0800  . FOLIC ACID PO Take 1 tablet by mouth daily. Reported on 08/30/2015   04/09/2017 at 0800  . gabapentin (NEURONTIN) 300  MG capsule Take 1 capsule (300 mg total) by mouth 2 (two) times daily. 60 capsule 0 04/09/2017 at 0800  . glucose blood test strip Check blood sugar once daily 100 each 12   . hydrochlorothiazide (MICROZIDE) 12.5 MG capsule TAKE 1 CAPSULE TWICE DAILY 180 capsule 1 04/09/2017 at 0800  . Lancets 28G MISC Check blood sugar once daily 100 each 5   . medroxyPROGESTERone (PROVERA) 10 MG tablet Take one tablet daily for 10 days every 30 days if no spontaneous menses and negative home UPT (Patient not taking: Reported on 04/10/2017) 30 tablet 4 Completed Course at Unknown time  . oxybutynin (DITROPAN-XL) 5 MG 24 hr tablet Take 1 tablet (5 mg total) by mouth at bedtime. 90 tablet 0 04/08/2017 at 2000  . pregabalin  (LYRICA) 200 MG capsule Take 1 capsule (200 mg total) by mouth 2 (two) times daily. 60 capsule 0 04/08/2017 at 2000  . topiramate (TOPAMAX) 50 MG tablet Take 1 tablet (50 mg total) by mouth daily. (Patient taking differently: Take 100 mg by mouth daily. ) 30 tablet 0 04/08/2017 at 2000  . traZODone (DESYREL) 100 MG tablet Take 1 tablet (100 mg total) by mouth at bedtime. 30 tablet 1 04/08/2017 at 2000  . Vitamin D, Ergocalciferol, (DRISDOL) 50000 units CAPS capsule Take 50,000 Units by mouth every 7 (seven) days.   03/31/2017 at 0800    Patient Stressors: Educational concerns Health problems Loss of mother  last year  Medication change or noncompliance  Patient Strengths: Ability for insight Average or above average intelligence Capable of independent living Communication skills Supportive family/friends  Treatment Modalities: Medication Management, Group therapy, Case management,  1 to 1 session with clinician, Psychoeducation, Recreational therapy.   Physician Treatment Plan for Primary Diagnosis: Bipolar I disorder, current or most recent episode depressed, with psychotic features ([place]) Long Term Goal(s): Improvement in symptoms so as ready for discharge NA   Short Term Goals: Ability to identify changes in lifestyle to reduce recurrence of condition will improve Ability to verbalize feelings will improve Ability to disclose and discuss suicidal ideas Ability to demonstrate self-control will improve Ability to identify and develop effective coping behaviors will improve Ability to identify triggers associated with substance abuse/mental health issues will improve NA  Medication Management: Evaluate patient's response, side effects, and tolerance of medication regimen.  Therapeutic Interventions: 1 to 1 sessions, Unit Group sessions and Medication administration.  Evaluation of Outcomes: Progressing  Physician Treatment Plan for Secondary Diagnosis: Principal Problem:   Bipolar I  disorder, current or most recent episode depressed, with psychotic features ([place]) Active Problems:   Suicidal ideation   HTN (hypertension)   Chronic pain syndrome   PTSD (post-traumatic stress disorder)   OCD (obsessive compulsive disorder)   Panic disorder with agoraphobia   TBI (traumatic brain injury) ([place])  Long Term Goal(s): Improvement in symptoms so as ready for discharge NA   Short Term Goals: Ability to identify changes in lifestyle to reduce recurrence of condition will improve Ability to verbalize feelings will improve Ability to disclose and discuss suicidal ideas Ability to demonstrate self-control will improve Ability to identify and develop effective coping behaviors will improve Ability to identify triggers associated with substance abuse/mental health issues will improve NA     Medication Management: Evaluate patient's response, side effects, and tolerance of medication regimen.  Therapeutic Interventions: 1 to 1 sessions, Unit Group sessions and Medication administration.  Evaluation of Outcomes: Progressing   RN Treatment Plan for Primary Diagnosis: Bipolar I  disorder, current or most recent episode depressed, with psychotic features ([place]) Long Term Goal(s): Knowledge of disease and therapeutic regimen to maintain health will improve  Short Term Goals: Ability to verbalize feelings will improve, Ability to disclose and discuss suicidal ideas, Ability to identify and develop effective coping behaviors will improve and Compliance with prescribed medications will improve  Medication Management: RN will administer medications as ordered by provider, will assess and evaluate patient's response and provide education to patient for prescribed medication. RN will report any adverse and/or side effects to prescribing provider.  Therapeutic Interventions: 1 on 1 counseling sessions, Psychoeducation, Medication administration, Evaluate responses to treatment, Monitor vital  signs and CBGs as ordered, Perform/monitor CIWA, COWS, AIMS and Fall Risk screenings as ordered, Perform wound care treatments as ordered.  Evaluation of Outcomes: Progressing   LCSW Treatment Plan for Primary Diagnosis: Bipolar I disorder, current or most recent episode depressed, with psychotic features ([place]) Long Term Goal(s): Safe transition to appropriate next level of care at discharge, Engage patient in therapeutic group addressing interpersonal concerns.  Short Term Goals: Engage patient in aftercare planning with referrals and resources, Increase social support and Increase skills for wellness and recovery  Therapeutic Interventions: Assess for all discharge needs, 1 to 1 time with Social worker, Explore available resources and support systems, Assess for adequacy in community support network, Educate family and significant other(s) on suicide prevention, Complete Psychosocial Assessment, Interpersonal group therapy.  Evaluation of Outcomes: Progressing   Progress in Treatment: Attending groups: Yes. Participating in groups: Yes. Taking medication as prescribed: Yes. Toleration medication: Yes. Family/Significant other contact made: No, will contact:  boyfriend Patient understands diagnosis: Yes. Discussing patient identified problems/goals with staff: Yes. Medical problems stabilized or resolved: Yes. Denies suicidal/homicidal ideation: Yes. Issues/concerns per patient self-inventory: No. Other: none  New problem(s) identified: No, Describe:  none  New Short Term/Long Term Goal(s): Pt goal: "Just to feel better.  Get my emotions under control."  Discharge Plan or Barriers:  Pt sees Dr Toy Care in [place].  Reason for Continuation of Hospitalization: Depression Medication stabilization  Estimated Length of Stay: 5-7 days. Attendees: Patient: Barbara Anderson 04/12/2017   Physician: Dr. Bary Leriche, MD 04/12/2017   Nursing: Elige Radon, RN 04/12/2017   RN Care Manager:  04/12/2017   Social Worker: Lurline Idol, LCSW 04/12/2017   Recreational Therapist:  04/12/2017   Other:  04/12/2017   Other:  04/12/2017   Other: 04/12/2017         Scribe for Treatment Team: [name], [place] 04/12/2017 2:29 PM

## 2017-04-13 MED ORDER — TRAZODONE HCL 100 MG PO TABS
150.0000 mg | ORAL_TABLET | Freq: Every day | ORAL | Status: DC
  Administered 2017-04-13 – 2017-04-15 (×3): 150 mg via ORAL
  Filled 2017-04-13 (×3): qty 1

## 2017-04-13 MED ORDER — POLYETHYLENE GLYCOL 3350 17 G PO PACK
17.0000 g | PACK | Freq: Every day | ORAL | Status: DC
  Administered 2017-04-13 – 2017-04-16 (×4): 17 g via ORAL
  Filled 2017-04-13 (×4): qty 1

## 2017-04-13 NOTE — Progress Notes (Signed)
Patient is pleasant and cooperative and anxious.  Prn medication given see mar. She complains of no bm for 5 days. New order for miralax started and milk of mag given.  No results will continue to monitor. She denies si, hi,a vh. She continues to use a walker to get around. She has had no behavior issues.  She is bright at times and does make good eye contact.

## 2017-04-13 NOTE — [facility] Suicide Risk Assessment (Addendum)
[place] INPATIENT:  Family/Significant Other Suicide Prevention Education  Suicide Prevention Education:  Education Completed; Carmela Hurt, Boyfriend, [phone], has been identified by the patient as the family member/significant other with whom the patient will be residing, and identified as the person(s) who will aid the patient in the event of a mental health crisis (suicidal ideations/suicide attempt).  With written consent from the patient, the family member/significant other has been provided the following suicide prevention education, prior to the and/or following the discharge of the patient.  The suicide prevention education provided includes the following:  Suicide risk factors  Suicide prevention and interventions  National Suicide Hotline telephone number  [facility] assessment telephone number  [facility] Emergency Assistance [place] and/or Residential Mobile Crisis Unit telephone number  Request made of family/significant other to:  Remove weapons (e.g., guns, rifles, knives), all items previously/currently identified as safety concern.  No guns in the home, per [place].  Remove drugs/medications (over-the-counter, prescriptions, illicit drugs), all items previously/currently identified as a safety concern. Remo Lipps reports there is a significant amount of medication in the home.  CSW discussed with him risks of this and encouraged him to see what is there and secure it.  The family member/significant other verbalizes understanding of the suicide prevention education information provided.  The family member/significant other agrees to remove the items of safety concern listed above.  Remo Lipps reports he has been seeing "a lot of depression and a little hoarding."  Remo Lipps said pt has always had tendencies towards hoarding things that are not needed.  When asked about pt medication usage, Remo Lipps said that "all she does is save her money to go see the  psychiatrist."  He said that she has not been in over 2 months and that when she cannot get her medicine she goes to her sister's house and uses cymbalta from the sister.  He also said Dr Toy Care has prescribed her xanax and adderrall and Remo Lipps does not know why she needs that.  [name] 04/13/2017, 9:27 AM

## 2017-04-13 NOTE — [facility] Group Notes (Signed)
LCSW Group Therapy Note  04/13/2017 1:15pm  Type of Therapy and Topic: Group Therapy: Holding on to Grudges   Participation Level: Active   Description of Group:  In this group patients will be asked to explore and define a grudge. Patients will be guided to discuss their thoughts, feelings, and reasons as to why people have grudges. Patients will process the impact grudges have on daily life and identify thoughts and feelings related to holding grudges. Facilitator will challenge patients to identify ways to let go of grudges and the benefits this provides. Patients will be confronted to address why one struggles letting go of grudges. Lastly, patients will identify feelings and thoughts related to what life would look like without grudges. This group will be process-oriented, with patients participating in exploration of their own experiences, giving and receiving support, and processing challenge from other group members.  Therapeutic Goals:  1. Patient will identify specific grudges related to their personal life.  2. Patient will identify feelings, thoughts, and beliefs around grudges.  3. Patient will identify how one releases grudges appropriately.  4. Patient will identify situations where they could have let go of the grudge, but instead chose to hold on.   Summary of Patient Progress: Pt shared a difficult situation from her past related to a grudge that was never resolved due to divorce.  Pt was active in group and able to identify thoughts and feelings.   Therapeutic Modalities:  Cognitive Behavioral Therapy  Solution Focused Therapy  Motivational Interviewing  Brief Therapy   [name], LCSW 04/13/2017 2:19 PM

## 2017-04-13 NOTE — Plan of Care (Signed)
Problem: Coping: Goal: Ability to cope will improve Outcome: Progressing Patient able to identify triggers of anxiety and states she is working on coping activities and strategies to use post hospitalization.  Goal: Ability to verbalize feelings will improve Outcome: Progressing Patient reports that hospitalization has "helped her 100%". States she is future focused and trying hard socialize with peers.    Problem: Safety: Goal: Ability to disclose and discuss suicidal ideas will improve Outcome: Progressing Patient denies suicidal ideation or any thoughts of self harm. She contracts for safety and states she will seek staff if necessary.   Problem: Activity: Goal: Sleeping patterns will improve Outcome: Not Progressing Patient reports poor sleep last night. Will monitor patient for sleep disturbances this shift.

## 2017-04-13 NOTE — [facility] Group Notes (Signed)
[place] Group Notes:  (Nursing/MHT/Case Management/Adjunct)  Date:  04/13/2017  Time:  5:06 AM  Type of Therapy:  Psychoeducational Skills  Participation Level:  Active  Participation Quality:  Appropriate  Affect:  Appropriate  Cognitive:  Appropriate  Insight:  Appropriate  Engagement in Group:  Engaged  Modes of Intervention:  Discussion, Socialization and Support  Summary of Progress/Problems:  Barbara Anderson 04/13/2017, 5:06 AM

## 2017-04-13 NOTE — Progress Notes (Signed)
[facility] MD Progress Note  04/13/2017 2:30 PM Barbara Anderson  MRN:  [id number]  Subjective:   Barbara Anderson is a 46 year old female with a history of depression and chronic pain admitted for worsening of symptoms, hallucinations and suicidal ideation in the context of medication noncompliance.  04/12/2017. Barbara Anderson met with treatment team today. She moves about using a walker. She complains of depression and severe anxiety. For the first time she discloses that she has not been able to see Dr. Robina Ade in 7 months or more. She pays out-of-pocket and has not been able to afford her visits. She told me that Dr. Robina Ade describes her Lyrica. It is unclear at this point whether the patient had access to Lyrica which we restarted in the hospital. She also tells me that Dr. Robina Ade prescribed Xanax and Adderall. She complains that all other medications have been prescribed by her primary care provider. We cannot confirm as she uses mail in pharmacy. The patient complains of fibromyalgia and back pain. She uses walker at home. I'm going to ask physical therapy to assess the need for walker. 9/15- Chart reviwed, discussed with nursing staff, pt seen. Pt anxious, depression better, denies SI. sttaes he has not passed bowel movement for about 5 days, denies pain, distention of belly. Reports poor sleep.   Per nursing: Remains depressed, Affect sad although brightens with engagement.   Medication compliant,support and encouragement offered. No behavioral issues to report on shift at this time.  Principal Problem: Bipolar I disorder, current or most recent episode depressed, with psychotic features [facility]) Diagnosis:   Patient Active Problem List   Diagnosis Date Noted  . Suicidal ideation [R45.851] 04/11/2017  . HTN (hypertension) [I10] 04/11/2017  . Chronic pain syndrome [G89.4] 04/11/2017  . PTSD (post-traumatic stress disorder) [F43.10] 04/11/2017  . OCD (obsessive compulsive disorder) [F42.9] 04/11/2017  . 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 TBI (traumatic brain injury) ([place]) [S06.9X9A] 04/11/2017  . Bipolar I disorder, current or most recent episode depressed, with psychotic features ([place]) [F31.5] 04/10/2017  . Difficulty concentrating [R41.840] 11/24/2015  . Abnormal bruising [R23.8] 10/26/2015  . Accumulation of fluid in tissues [R60.9] 10/26/2015  . Fibrositis [M79.7] 10/26/2015  . Cervical pain [M54.2] 10/26/2015  . Adiposity [E66.9] 10/26/2015  . 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 Oligomenorrhea [N91.5] 08/30/2015  . Weight gain [R63.5] 08/30/2015  . Costochondritis [M94.0] 04/14/2015  . Dyspnea [R06.00] 04/14/2015  . Recurrent vaginitis [N76.0]   . Clinical depression [F32.9] 09/21/2008  . Late effect of certain complications of trauma [G38.9XXS] 09/21/2008  . Anal bleeding [K62.5] 09/10/2008  . 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  Past Medical History:  Past Medical History:  Diagnosis Date  . ADD (attention deficit disorder)   . Anxiety   . Concussion 2001   head trauma-work related. Caused headaches and short term memory loss.  . Depression   . Overactive bladder   . Recurrent vaginitis     Past Surgical History:  Procedure Laterality Date  . ankle pin and plates placement     right  . COSMETIC SURGERY  2005   BREASTS, ARM, ABDOMEN AFTER WEIGHT LOSS   Family History:  Family History  Problem Relation Age of Onset  . Diabetes Mother   . Hypertension Mother   . Depression Mother   . Diabetes Father   . Hypertension Father   . Anxiety disorder Father   .  Depression Father   . ADD / ADHD Father   . Depression Sister   . Bipolar disorder Sister   . ADD / ADHD Brother    Family Psychiatric  History: see H&P. Social History:  History  Alcohol Use No     History  Drug Use No    Social History   Social History  .  Marital status: Single    Spouse name: N/A  . Number of children: N/A  . Years of education: N/A   Social History Main Topics  . Smoking status: Never Smoker  . Smokeless tobacco: Never Used  . Alcohol use No  . Drug use: No  . Sexual activity: Yes    Birth control/ protection: Condom   Other Topics Concern  . None   Social History Narrative  . None   Additional Social History:                         Sleep: Fair  Appetite:  Fair  Current Medications: Current Facility-Administered Medications  Medication Dose Route Frequency Provider Last Rate Last Dose  . acetaminophen (TYLENOL) tablet 650 mg  650 mg Oral Q6H PRN Clapacs, Madie Reno, MD   650 mg at 04/11/17 0946  . alum & mag hydroxide-simeth (MAALOX/MYLANTA) 200-200-20 MG/5ML suspension 30 mL  30 mL Oral Q4H PRN Clapacs, John T, MD      . DULoxetine (CYMBALTA) DR capsule 60 mg  60 mg Oral BID Clapacs, Madie Reno, MD   60 mg at 04/13/17 0801  . fluvoxaMINE (LUVOX) tablet 50 mg  50 mg Oral QHS Pucilowska, Jolanta B, MD   50 mg at 04/12/17 2126  . gabapentin (NEURONTIN) tablet 300 mg  300 mg Oral TID Pucilowska, Jolanta B, MD   300 mg at 04/13/17 1134  . hydrochlorothiazide (HYDRODIURIL) tablet 25 mg  25 mg Oral Daily Pucilowska, Jolanta B, MD   25 mg at 04/13/17 0800  . hydrOXYzine (ATARAX/VISTARIL) tablet 25 mg  25 mg Oral TID PRN Pucilowska, Jolanta B, MD   25 mg at 04/13/17 0803  . ibuprofen (ADVIL,MOTRIN) tablet 600 mg  600 mg Oral Q6H PRN Pucilowska, Jolanta B, MD      . magnesium hydroxide (MILK OF MAGNESIA) suspension 30 mL  30 mL Oral Daily PRN Clapacs, John T, MD   30 mL at 04/13/17 1134  . medroxyPROGESTERone (PROVERA) tablet 10 mg  10 mg Oral Daily Pucilowska, Jolanta B, MD   10 mg at 04/13/17 0803  . oxybutynin (DITROPAN-XL) 24 hr tablet 5 mg  5 mg Oral QHS Pucilowska, Jolanta B, MD   5 mg at 04/12/17 2126  . polyethylene glycol (MIRALAX / GLYCOLAX) packet 17 g  17 g Oral Daily Lenward Chancellor, MD      .  pregabalin (LYRICA) capsule 200 mg  200 mg Oral BID Pucilowska, Jolanta B, MD   200 mg at 04/13/17 0800  . topiramate (TOPAMAX) tablet 200 mg  200 mg Oral QHS Pucilowska, Jolanta B, MD   200 mg at 04/12/17 2126  . traZODone (DESYREL) tablet 150 mg  150 mg Oral QHS Lenward Chancellor, MD        Lab Results:  No results found for this or any previous visit (from the past 48 hour(s)).  Blood Alcohol level:  Lab Results  Component Value Date   ETH <5 16/04/9603    Metabolic Disorder Labs: Lab Results  Component Value Date   HGBA1C 6.1 (H) 04/11/2017  MPG 128.37 04/11/2017   Lab Results  Component Value Date   PROLACTIN 9.5 08/30/2015   PROLACTIN 7.7 07/02/2013   Lab Results  Component Value Date   CHOL 202 (H) 04/11/2017   TRIG 149 04/11/2017   HDL 33 (L) 04/11/2017   CHOLHDL 6.1 04/11/2017   VLDL 30 04/11/2017   LDLCALC 139 (H) 04/11/2017   LDLCALC 105 (H) 09/24/2011    Physical Findings: AIMS: Facial and Oral Movements Muscles of Facial Expression: None, normal Lips and Perioral Area: None, normal Jaw: None, normal Tongue: None, normal,Extremity Movements Upper (arms, wrists, hands, fingers): None, normal Lower (legs, knees, ankles, toes): None, normal, Trunk Movements Neck, shoulders, hips: None, normal, Overall Severity Severity of abnormal movements (highest score from questions above): None, normal Incapacitation due to abnormal movements: None, normal Patient's awareness of abnormal movements (rate only patient's report): No Awareness, Dental Status Current problems with teeth and/or dentures?: No Does patient usually wear dentures?: No  CIWA:    COWS:     Musculoskeletal: Strength & Muscle Tone: within normal limits Gait & Station: normal Patient leans: N/A  Psychiatric Specialty Exam: Physical Exam  Nursing note and vitals reviewed. Psychiatric: Her speech is normal. Her mood appears anxious. She is slowed and withdrawn. Cognition and memory are  normal. She expresses impulsivity. She exhibits a depressed mood. She expresses suicidal ideation. She expresses suicidal plans.    Review of Systems  Musculoskeletal: Positive for back pain, falls and myalgias.  Psychiatric/Behavioral: Positive for depression and suicidal ideas. The patient is nervous/anxious and has insomnia.   All other systems reviewed and are negative.   Blood pressure 100/63, pulse 68, temperature 98 F (36.7 C), temperature source Oral, resp. rate 18, height '5\' 7"'  (1.702 m), weight 113.6 kg (250 lb 8 oz), last menstrual period 04/07/2017, SpO2 100 %.Body mass index is 39.23 kg/m.  General Appearance: Casual  Eye Contact:  Good  Speech:  Clear and Coherent  Volume:  Normal  Mood:  Anxious and Depressed  Affect:  Blunt  Thought Process:  Goal Directed and Descriptions of Associations: Intact  Orientation:  Full (Time, Place, and Person)  Thought Content:  Hallucinations: Auditory Visual  Suicidal Thoughts:  denies  Homicidal Thoughts:  No  Memory:  Immediate;   Fair Recent;   Fair Remote;   Fair  Judgement:  Poor  Insight:  Shallow  Psychomotor Activity:  Psychomotor Retardation  Concentration:  Concentration: Fair and Attention Span: Fair  Recall:  [employer] of Knowledge:  Fair  Language:  Fair  Akathisia:  No  Handed:  Right  AIMS (if indicated):     Assets:  Communication Skills Desire for Improvement Financial Resources/Insurance Housing Resilience Social Support  ADL's:  Intact  Cognition:  WNL  Sleep:  Number of Hours: 7.25     Treatment Plan Summary: Daily contact with patient to assess and evaluate symptoms and progress in treatment and Medication management   Ms. Asch is a 46 year old female with a history of bipolar disorder and chronic pain admitted for worsening of depression and suicidal ideation in the context of treatment noncompliance. laxatives for constipation,  increase trazodone for sleep .  1. Suicidal ideation. The  patient is able to contract for safety in the hospital.  2. Mood. We restarted Cymbalta and Neurontin and increased Topamax to 200 mg.  3. Anxiety. The patient reports panic attacks, severe social anxiety with agoraphobia, nightmares and flashbacks of PTSD, and OCD symptoms. Started Luvox.   4. Chronic pain.  She is on Lyrica, Neurontin, and Cymbalta. She takes ibuprofen as needed.  5. Insomnia.  increase trazodone for sleep   6. Hypertension. Hydrochlorothiazide is available.  7. Urinary incontinence. She is on Ditropan.  8. Prediabetes. She is on an ADA diet.  9. Metabolic syndrome monitoring. Lipid panel shows elevated cholesterol and triglycerides, normal TSH, and hemoglobin A1c of 6.1.   10. EKG. Normal sinus rhythm, QTc 439.   11. Disposition. She will be discharge to her apartment. She will follow up with Dr. Robina Ade.   Lenward Chancellor, MD 04/13/2017, 2:30 PMPatient ID: Barbara Anderson, female   DOB: 06-23-1971, 46 y.o.   MRN: [id number]

## 2017-04-14 MED ORDER — HYDROXYZINE HCL 50 MG PO TABS
50.0000 mg | ORAL_TABLET | Freq: Three times a day (TID) | ORAL | Status: DC | PRN
  Administered 2017-04-14 – 2017-04-16 (×4): 50 mg via ORAL
  Filled 2017-04-14 (×4): qty 1

## 2017-04-14 MED ORDER — GABAPENTIN 400 MG PO CAPS: 400.0000 mg | ORAL_CAPSULE | Freq: Three times a day (TID) | ORAL | Status: DC

## 2017-04-14 MED ORDER — RISPERIDONE 1 MG PO TABS
1.0000 mg | ORAL_TABLET | Freq: Every day | ORAL | Status: DC
  Administered 2017-04-14 – 2017-04-15 (×2): 1 mg via ORAL
  Filled 2017-04-14 (×2): qty 1

## 2017-04-14 MED ORDER — FLUVOXAMINE MALEATE 50 MG PO TABS
25.0000 mg | ORAL_TABLET | Freq: Every day | ORAL | Status: DC
  Administered 2017-04-14: 25 mg via ORAL
  Filled 2017-04-14: qty 1

## 2017-04-14 NOTE — Progress Notes (Addendum)
[facility] MD Progress Note  04/14/2017 11:27 AM Barbara Anderson  MRN:  [id number]  Subjective:   Barbara Anderson is a 46 year old female with a history of depression and chronic pain admitted for worsening of symptoms, hallucinations and suicidal ideation in the context of medication noncompliance.   9/16- Chart reviwed, discussed with nursing staff, pt seen. Pt endorsing anxiety, depression, but denies SI.  Had some BM.  Pt reports increased anxiety with starting luvox. Pt reports hearing "whisper" for about 6 months, reports mood swings, with self injurious behavior( picking nail, skin, has h/o cutting wrist), reports taking Abilify in the past. Reports she plans to stay on Lyrica than Neurontin because it helps better for her pain, states buspar did not help for anxiety..     Principal Problem: Bipolar I disorder, current or most recent episode depressed, with psychotic features ([place]) Diagnosis:   Patient Active Problem List   Diagnosis Date Noted  . Suicidal ideation [R45.851] 04/11/2017  . HTN (hypertension) [I10] 04/11/2017  . Chronic pain syndrome [G89.4] 04/11/2017  . PTSD (post-traumatic stress disorder) [F43.10] 04/11/2017  . OCD (obsessive compulsive disorder) [F42.9] 04/11/2017  . 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 TBI (traumatic brain injury) ([place]) [S06.9X9A] 04/11/2017  . Bipolar I disorder, current or most recent episode depressed, with psychotic features ([place]) [F31.5] 04/10/2017  . Difficulty concentrating [R41.840] 11/24/2015  . Abnormal bruising [R23.8] 10/26/2015  . Accumulation of fluid in tissues [R60.9] 10/26/2015  . Fibrositis [M79.7] 10/26/2015  . Cervical pain [M54.2] 10/26/2015  . Adiposity [E66.9] 10/26/2015  . 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 Oligomenorrhea [N91.5] 08/30/2015  . Weight gain [R63.5] 08/30/2015  . Costochondritis [M94.0] 04/14/2015  . Dyspnea [R06.00] 04/14/2015  . Recurrent vaginitis [N76.0]   . Clinical depression  [F32.9] 09/21/2008  . Late effect of certain complications of trauma [E70.9XXS] 09/21/2008  . Anal bleeding [K62.5] 09/10/2008  . 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  Past Medical History:  Past Medical History:  Diagnosis Date  . ADD (attention deficit disorder)   . Anxiety   . Concussion 2001   head trauma-work related. Caused headaches and short term memory loss.  . Depression   . Overactive bladder   . Recurrent vaginitis     Past Surgical History:  Procedure Laterality Date  . ankle pin and plates placement     right  . COSMETIC SURGERY  2005   BREASTS, ARM, ABDOMEN AFTER WEIGHT LOSS   Family History:  Family History  Problem Relation Age of Onset  . Diabetes Mother   . Hypertension Mother   . Depression Mother   . Diabetes Father   . Hypertension Father   . Anxiety disorder Father   . Depression Father   . ADD / ADHD Father   . Depression Sister   . Bipolar disorder Sister   . ADD / ADHD Brother    Family Psychiatric  History: see H&P. Social History:  History  Alcohol Use No     History  Drug Use No    Social History   Social History  . Marital status: Single    Spouse name: N/A  . Number of children: N/A  . Years of education: N/A   Social History Main Topics  . Smoking status: Never Smoker  . Smokeless tobacco: Never Used  . Alcohol use No  . Drug use: No  . Sexual activity: Yes  Birth control/ protection: Condom   Other Topics Concern  . None   Social History Narrative  . None   Additional Social History:                         Sleep: Fair  Appetite:  Fair  Current Medications: Current Facility-Administered Medications  Medication Dose Route Frequency Provider Last Rate Last Dose  . acetaminophen (TYLENOL) tablet 650 mg  650 mg Oral Q6H PRN Clapacs, Madie Reno, MD   650 mg at 04/11/17 0946  .  alum & mag hydroxide-simeth (MAALOX/MYLANTA) 200-200-20 MG/5ML suspension 30 mL  30 mL Oral Q4H PRN Clapacs, John T, MD      . DULoxetine (CYMBALTA) DR capsule 60 mg  60 mg Oral BID Clapacs, Madie Reno, MD   60 mg at 04/14/17 0816  . fluvoxaMINE (LUVOX) tablet 50 mg  50 mg Oral QHS Pucilowska, Jolanta B, MD   50 mg at 04/13/17 2155  . gabapentin (NEURONTIN) tablet 300 mg  300 mg Oral TID Pucilowska, Jolanta B, MD   300 mg at 04/14/17 0816  . hydrochlorothiazide (HYDRODIURIL) tablet 25 mg  25 mg Oral Daily Pucilowska, Jolanta B, MD   25 mg at 04/14/17 0816  . hydrOXYzine (ATARAX/VISTARIL) tablet 25 mg  25 mg Oral TID PRN Pucilowska, Jolanta B, MD   25 mg at 04/14/17 0819  . ibuprofen (ADVIL,MOTRIN) tablet 600 mg  600 mg Oral Q6H PRN Pucilowska, Jolanta B, MD      . magnesium hydroxide (MILK OF MAGNESIA) suspension 30 mL  30 mL Oral Daily PRN Clapacs, John T, MD   30 mL at 04/13/17 1134  . medroxyPROGESTERone (PROVERA) tablet 10 mg  10 mg Oral Daily Pucilowska, Jolanta B, MD   10 mg at 04/14/17 0819  . oxybutynin (DITROPAN-XL) 24 hr tablet 5 mg  5 mg Oral QHS Pucilowska, Jolanta B, MD   5 mg at 04/13/17 2154  . polyethylene glycol (MIRALAX / GLYCOLAX) packet 17 g  17 g Oral Daily Lenward Chancellor, MD   17 g at 04/14/17 0816  . pregabalin (LYRICA) capsule 200 mg  200 mg Oral BID Pucilowska, Jolanta B, MD   200 mg at 04/14/17 0816  . topiramate (TOPAMAX) tablet 200 mg  200 mg Oral QHS Pucilowska, Jolanta B, MD   200 mg at 04/13/17 2155  . traZODone (DESYREL) tablet 150 mg  150 mg Oral QHS Lenward Chancellor, MD   150 mg at 04/13/17 2154    Lab Results:  No results found for this or any previous visit (from the past 48 hour(s)).  Blood Alcohol level:  Lab Results  Component Value Date   ETH <5 56/38/7564    Metabolic Disorder Labs: Lab Results  Component Value Date   HGBA1C 6.1 (H) 04/11/2017   MPG 128.37 04/11/2017   Lab Results  Component Value Date   PROLACTIN 9.5 08/30/2015   PROLACTIN  7.7 07/02/2013   Lab Results  Component Value Date   CHOL 202 (H) 04/11/2017   TRIG 149 04/11/2017   HDL 33 (L) 04/11/2017   CHOLHDL 6.1 04/11/2017   VLDL 30 04/11/2017   LDLCALC 139 (H) 04/11/2017   LDLCALC 105 (H) 09/24/2011    Physical Findings: AIMS: Facial and Oral Movements Muscles of Facial Expression: None, normal Lips and Perioral Area: None, normal Jaw: None, normal Tongue: None, normal,Extremity Movements Upper (arms, wrists, hands, fingers): None, normal Lower (legs, knees, ankles, toes): None, normal, Trunk Movements Neck, shoulders,  hips: None, normal, Overall Severity Severity of abnormal movements (highest score from questions above): None, normal Incapacitation due to abnormal movements: None, normal Patient's awareness of abnormal movements (rate only patient's report): No Awareness, Dental Status Current problems with teeth and/or dentures?: No Does patient usually wear dentures?: No  CIWA:    COWS:     Musculoskeletal: Strength & Muscle Tone: within normal limits Gait & Station: normal Patient leans: N/A  Psychiatric Specialty Exam: Physical Exam  Nursing note and vitals reviewed. Psychiatric: Her speech is normal. Her mood appears anxious. She is slowed and withdrawn. Cognition and memory are normal. She expresses impulsivity. She exhibits a depressed mood. She expresses suicidal ideation. She expresses suicidal plans.    Review of Systems  Musculoskeletal: Positive for back pain, falls and myalgias.  Psychiatric/Behavioral: Positive for depression and suicidal ideas. The patient is nervous/anxious and has insomnia.   All other systems reviewed and are negative.   Blood pressure 116/82, pulse 75, temperature 98.5 F (36.9 C), temperature source Oral, resp. rate 18, height 5\' 7"  (1.702 m), weight 113.6 kg (250 lb 8 oz), last menstrual period 04/07/2017, SpO2 100 %.Body mass index is 39.23 kg/m.  General Appearance: Casual  Eye Contact:  Good   Speech:  Clear and Coherent  Volume:  Normal  Mood:  Anxious and Depressed  Affect:  blunted  Thought Process:  Goal Directed and Descriptions of Associations: Intact  Orientation:  Full (Time, Place, and Person)  Thought Content:  Hallucinations: Auditory Visual, AH of whisper  Suicidal Thoughts:  denies  Homicidal Thoughts:  No  Memory:  Immediate;   Fair Recent;   Fair Remote;   Fair  Judgement:  Poor  Insight:  Shallow  Psychomotor Activity:  Psychomotor Retardation  Concentration:  Concentration: Fair and Attention Span: Fair  Recall:  [employer] of Knowledge:  Fair  Language:  Fair  Akathisia:  No  Handed:  Right  AIMS (if indicated):     Assets:  Communication Skills Desire for Improvement Financial Resources/Insurance Housing Resilience Social Support  ADL's:  Intact  Cognition:  WNL  Sleep:  Number of Hours: 7.25     Treatment Plan Summary: Daily contact with patient to assess and evaluate symptoms and progress in treatment and Medication management   Ms. Storbeck is a 46 year old female with a history of bipolar disorder and chronic pain admitted for worsening of depression and suicidal ideation in the context of treatment noncompliance. laxatives for constipation,  trazodone for sleep .  1. Suicidal ideation. The patient is able to contract for safety in the hospital.  2. Mood. Add risperdal , cont  Cymbalta .  3. Anxiety. Increase prn vistaril, decrease luvox. Cont cymbalta, lyrica. The patient reports panic attacks, severe social anxiety with agoraphobia, nightmares and flashbacks of PTSD, and OCD symptoms.   4. Chronic pain. She is on Lyrica,  Cymbalta. She takes ibuprofen as needed.  5. Insomnia.  trazodone for sleep   6. Hypertension. Hydrochlorothiazide is available.  7. Urinary incontinence. She is on Ditropan.  8. Prediabetes. She is on an ADA diet.  9. Metabolic syndrome monitoring. Lipid panel shows elevated cholesterol and  triglycerides, normal TSH, and hemoglobin A1c of 6.1.   10. EKG. Normal sinus rhythm, QTc 439.   11. Disposition. She will be discharge to her apartment. She will follow up with Dr. Robina Ade.   Lenward Chancellor, MD 04/14/2017, 11:27 AMPatient ID: Barbara Anderson, female   DOB: Nov 14, 1970, 46 y.o.   MRN: [id number] Patient  ID: Barbara Anderson, female   DOB: [birth year], 46 y.o.   MRN: [id number]

## 2017-04-14 NOTE — [facility] Group Notes (Signed)
[place] Group Notes:  (Nursing/MHT/Case Management/Adjunct)  Date:  04/14/2017  Time:  5:10 AM  Type of Therapy:  Psychoeducational Skills  Participation Level:  Did Not Attend  Summary of Progress/Problems:  Reece Agar 04/14/2017, 5:10 AM

## 2017-04-14 NOTE — Progress Notes (Signed)
Patient ID: Avana Kreiser, female   DOB: 01-02-1971, 46 y.o.   MRN: [id number] LCSW Group Therapy Note  04/14/2017 1:00pm  Type of Therapy and Topic:  Group Therapy:  Feelings around Relapse and Recovery  Participation Level:  Minimal   Description of Group:    Patients in this group will discuss emotions they experience before and after a relapse. They will process how experiencing these feelings, or avoidance of experiencing them, relates to having a relapse. Facilitator will guide patients to explore emotions they have related to recovery. Patients will be encouraged to process which emotions are more powerful. They will be guided to discuss the emotional reaction significant others in their lives may have to their relapse or recovery. Patients will be assisted in exploring ways to respond to the emotions of others without this contributing to a relapse.  Therapeutic Goals: 1. Patient will identify two or more emotions that lead to a relapse for them 2. Patient will identify two emotions that result when they relapse 3. Patient will identify two emotions related to recovery 4. Patient will demonstrate ability to communicate their needs through discussion and/or role plays   Summary of Patient Progress:  Pt had minimal participation but after help from her peers she seemed to better understand that self-sabotage and enabling can happen with any illness/behavior change not just addiction. Then she was able to meet therapeutic goals above.   Therapeutic Modalities:   Cognitive Behavioral Therapy Solution-Focused Therapy Assertiveness Training Relapse Prevention Therapy   August Saucer, LCSW 04/14/2017 3:00 PM

## 2017-04-14 NOTE — Progress Notes (Signed)
Barbara Anderson remains flat on approach and was cooperative with treatment, she was visible in the milieu, interacting with peers and staff, she interacted well with peers and staff on shift.  She was medication complaint on shift. Patient remains depressed, and no behavioral issues to report on shift at this time.

## 2017-04-14 NOTE — Progress Notes (Signed)
Pharmacy Note:  Patient ordered Gabapentin 400mg  po TID today 04/14/17. Patient also on Lyrica 200mg  po bid.  Paged MD, Alonna Minium concerning Duplicate therapy.  MD states patient takes both at home and to continue both orders.  Chinita Greenland PharmD Clinical Pharmacist 04/14/2017

## 2017-04-14 NOTE — Progress Notes (Signed)
Patient has had a good day. She has been med compliant on this shift. She makes eye contact. She continues to use the walker to get around. She denies si, hi, avh. She does have anxiety and md increased anxiety medication. She is appropriate on the unit. The patient does have a visitor and that appeared to have went well.will continue to monitor.

## 2017-04-15 MED ORDER — DULOXETINE HCL 60 MG PO CPEP: 120.0000 mg | ORAL_CAPSULE | Freq: Every day | ORAL | 0 refills | Status: AC

## 2017-04-15 MED ORDER — RISPERIDONE 1 MG PO TABS: 1.0000 mg | ORAL_TABLET | Freq: Every day | ORAL | 1 refills | Status: DC

## 2017-04-15 MED ORDER — TOPIRAMATE 200 MG PO TABS: 200.0000 mg | ORAL_TABLET | Freq: Every day | ORAL | 1 refills | Status: DC

## 2017-04-15 MED ORDER — HYDROXYZINE HCL 50 MG PO TABS: 50.0000 mg | ORAL_TABLET | Freq: Three times a day (TID) | ORAL | 1 refills | Status: DC | PRN

## 2017-04-15 MED ORDER — TRAZODONE HCL 150 MG PO TABS: 150.0000 mg | ORAL_TABLET | Freq: Every day | ORAL | 1 refills | Status: DC

## 2017-04-15 NOTE — Plan of Care (Signed)
Problem: Coping: Goal: Ability to cope will improve Outcome: Progressing Able to verbalize feelings although continues to take medication for anxiety.

## 2017-04-15 NOTE — Progress Notes (Signed)
Patient ID: Barbara Anderson, female   DOB: 1970-11-21, 46 y.o.   MRN: [id number] LCSW Group Therapy Note   04/15/2017 9:30am   Type of Therapy and Topic:  Group Therapy:  Overcoming Obstacles   Participation Level:  Minimal   Description of Group:    In this group patients will be encouraged to explore what they see as obstacles to their own wellness and recovery. They will be guided to discuss their thoughts, feelings, and behaviors related to these obstacles. The group will process together ways to cope with barriers, with attention given to specific choices patients can make. Each patient will be challenged to identify changes they are motivated to make in order to overcome their obstacles. This group will be process-oriented, with patients participating in exploration of their own experiences as well as giving and receiving support and challenge from other group members.   Therapeutic Goals: 1. Patient will identify personal and current obstacles as they relate to admission. 2. Patient will identify barriers that currently interfere with their wellness or overcoming obstacles.  3. Patient will identify feelings, thought process and behaviors related to these barriers. 4. Patient will identify two changes they are willing to make to overcome these obstacles:      Summary of Patient Progress Pt able to meet therapeutic goals however, she participates minimally.  She listens actively often shaking her head in agreement or understanding with peers, but not expand on when asked.     Therapeutic Modalities:   Cognitive Behavioral Therapy Solution Focused Therapy Motivational Interviewing Relapse Prevention Therapy  August Saucer, LCSW 04/15/2017 11:31 AM

## 2017-04-15 NOTE — Progress Notes (Signed)
D: Pt denies SI/HI/AVH. Pt is pleasant and cooperative. Pt stated she was ready to get back home. Pt was seen interacting with peers in the dayroom.   A: Pt was offered support and encouragement. Pt was given scheduled medications. Pt was encourage to attend groups. Q 15 minute checks were done for safety.   R:Pt attends groups and interacts well with peers and staff. Pt is taking medication. Pt has no complaints.Pt receptive to treatment and safety maintained on unit.

## 2017-04-15 NOTE — Progress Notes (Signed)
[facility] MD Progress Note  04/15/2017 11:48 AM Barbara Anderson  MRN:  [id number]  Subjective:   Barbara Anderson is a 46 year old female with a history of depression and chronic pain admitted for worsening of symptoms, hallucinations and suicidal ideation in the context of medication noncompliance.  9/16-Chart reviwed, discussed with nursing staff, pt seen. Pt endorsing anxiety, depression, but denies SI.  Had some BM.  Pt reports increased anxiety with starting luvox. Pt reports hearing "whisper" for about 6 months, reports mood swings, with self injurious behavior( picking nail, skin, has h/o cutting wrist), reports taking Abilify in the past. Reports she plans to stay on Lyrica than Neurontin because it helps better for her pain, states buspar did not help for anxiety.  04/15/2017. Barbara Anderson reports improvement over the weekend. She no longer feels suicidal. Her mood is improving, affect is brighter. She believes that medication adjustments have been helpful. She initially insisted on getting Xanax and Adderall as prescribed by Dr. Robina Ade. Sleep and appetite are fair. Unfortunately, she befriended a peer in the hospital and wants to allow her to move in with her. We strongly counseled her against it. Anticipated discharge tomorrow.   Per nursing: Barbara Anderson remains flat on approach and was cooperative with treatment, she was visible in the milieu, interacting with peers and staff, she interacted well with peers and staff on shift.  She was medication complaint on shift. Patient remains depressed, and no behavioral issues to report on shift at this time.   Principal Problem: Bipolar I disorder, current or most recent episode depressed, with psychotic features [facility]) Diagnosis:   Patient Active Problem List   Diagnosis Date Noted  . Suicidal ideation [R45.851] 04/11/2017  . HTN (hypertension) [I10] 04/11/2017  . Chronic pain syndrome [G89.4] 04/11/2017  . PTSD (post-traumatic stress disorder) [F43.10] 04/11/2017  .  OCD (obsessive compulsive disorder) [F42.9] 04/11/2017  . 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 TBI (traumatic brain injury) ([place]) [S06.9X9A] 04/11/2017  . Bipolar I disorder, current or most recent episode depressed, with psychotic features ([place]) [F31.5] 04/10/2017  . Difficulty concentrating [R41.840] 11/24/2015  . Abnormal bruising [R23.8] 10/26/2015  . Accumulation of fluid in tissues [R60.9] 10/26/2015  . Fibrositis [M79.7] 10/26/2015  . Cervical pain [M54.2] 10/26/2015  . Adiposity [E66.9] 10/26/2015  . 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 Oligomenorrhea [N91.5] 08/30/2015  . Weight gain [R63.5] 08/30/2015  . Costochondritis [M94.0] 04/14/2015  . Dyspnea [R06.00] 04/14/2015  . Recurrent vaginitis [N76.0]   . Clinical depression [F32.9] 09/21/2008  . Late effect of certain complications of trauma [K02.9XXS] 09/21/2008  . Anal bleeding [K62.5] 09/10/2008  . 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  Past Medical History:  Past Medical History:  Diagnosis Date  . ADD (attention deficit disorder)   . Anxiety   . Concussion 2001   head trauma-work related. Caused headaches and short term memory loss.  . Depression   . Overactive bladder   . Recurrent vaginitis     Past Surgical History:  Procedure Laterality Date  . ankle pin and plates placement     right  . COSMETIC SURGERY  2005   BREASTS, ARM, ABDOMEN AFTER WEIGHT LOSS   Family History:  Family History  Problem Relation Age of Onset  . Diabetes Mother   . Hypertension Mother   . Depression Mother   . Diabetes Father   . Hypertension Father   .  Anxiety disorder Father   . Depression Father   . ADD / ADHD Father   . Depression Sister   . Bipolar disorder Sister   . ADD / ADHD Brother    Family Psychiatric  History: see H&P. Social History:  History  Alcohol Use No      History  Drug Use No    Social History   Social History  . Marital status: Single    Spouse name: N/A  . Number of children: N/A  . Years of education: N/A   Social History Main Topics  . Smoking status: Never Smoker  . Smokeless tobacco: Never Used  . Alcohol use No  . Drug use: No  . Sexual activity: Yes    Birth control/ protection: Condom   Other Topics Concern  . None   Social History Narrative  . None   Additional Social History:                         Sleep: Fair  Appetite:  Fair  Current Medications: Current Facility-Administered Medications  Medication Dose Route Frequency Provider Last Rate Last Dose  . acetaminophen (TYLENOL) tablet 650 mg  650 mg Oral Q6H PRN Clapacs, Madie Reno, MD   650 mg at 04/11/17 0946  . alum & mag hydroxide-simeth (MAALOX/MYLANTA) 200-200-20 MG/5ML suspension 30 mL  30 mL Oral Q4H PRN Clapacs, John T, MD      . DULoxetine (CYMBALTA) DR capsule 60 mg  60 mg Oral BID Clapacs, Madie Reno, MD   60 mg at 04/15/17 0825  . fluvoxaMINE (LUVOX) tablet 25 mg  25 mg Oral QHS Lenward Chancellor, MD   25 mg at 04/14/17 2202  . hydrochlorothiazide (HYDRODIURIL) tablet 25 mg  25 mg Oral Daily Margarine Grosshans B, MD   25 mg at 04/15/17 0824  . hydrOXYzine (ATARAX/VISTARIL) tablet 50 mg  50 mg Oral TID PRN Lenward Chancellor, MD   50 mg at 04/15/17 0824  . ibuprofen (ADVIL,MOTRIN) tablet 600 mg  600 mg Oral Q6H PRN Yancy Knoble B, MD   600 mg at 04/14/17 2204  . magnesium hydroxide (MILK OF MAGNESIA) suspension 30 mL  30 mL Oral Daily PRN Clapacs, John T, MD   30 mL at 04/13/17 1134  . medroxyPROGESTERone (PROVERA) tablet 10 mg  10 mg Oral Daily Markiyah Gahm B, MD   10 mg at 04/15/17 0824  . oxybutynin (DITROPAN-XL) 24 hr tablet 5 mg  5 mg Oral QHS Laquandra Carrillo B, MD   5 mg at 04/14/17 2201  . polyethylene glycol (MIRALAX / GLYCOLAX) packet 17 g  17 g Oral Daily Lenward Chancellor, MD   17 g at 04/15/17 0824  . pregabalin  (LYRICA) capsule 200 mg  200 mg Oral BID Mashal Slavick B, MD   200 mg at 04/15/17 0824  . risperiDONE (RISPERDAL) tablet 1 mg  1 mg Oral QHS Lenward Chancellor, MD   1 mg at 04/14/17 2202  . topiramate (TOPAMAX) tablet 200 mg  200 mg Oral QHS Rodman Recupero B, MD   200 mg at 04/14/17 2201  . traZODone (DESYREL) tablet 150 mg  150 mg Oral QHS Lenward Chancellor, MD   150 mg at 04/14/17 2201    Lab Results:  No results found for this or any previous visit (from the past 48 hour(s)).  Blood Alcohol level:  Lab Results  Component Value Date   ETH <5 81/82/9937    Metabolic Disorder Labs: Lab Results  Component Value Date   HGBA1C 6.1 (H) 04/11/2017   MPG 128.37 04/11/2017   Lab Results  Component Value Date   PROLACTIN 9.5 08/30/2015   PROLACTIN 7.7 07/02/2013   Lab Results  Component Value Date   CHOL 202 (H) 04/11/2017   TRIG 149 04/11/2017   HDL 33 (L) 04/11/2017   CHOLHDL 6.1 04/11/2017   VLDL 30 04/11/2017   LDLCALC 139 (H) 04/11/2017   LDLCALC 105 (H) 09/24/2011    Physical Findings: AIMS: Facial and Oral Movements Muscles of Facial Expression: None, normal Lips and Perioral Area: None, normal Jaw: None, normal Tongue: None, normal,Extremity Movements Upper (arms, wrists, hands, fingers): None, normal Lower (legs, knees, ankles, toes): None, normal, Trunk Movements Neck, shoulders, hips: None, normal, Overall Severity Severity of abnormal movements (highest score from questions above): None, normal Incapacitation due to abnormal movements: None, normal Patient's awareness of abnormal movements (rate only patient's report): No Awareness, Dental Status Current problems with teeth and/or dentures?: No Does patient usually wear dentures?: No  CIWA:    COWS:     Musculoskeletal: Strength & Muscle Tone: within normal limits Gait & Station: normal Patient leans: N/A  Psychiatric Specialty Exam: Physical Exam  Nursing note and vitals  reviewed. Psychiatric: Her speech is normal. Her mood appears anxious. She is slowed and withdrawn. Cognition and memory are normal. She expresses impulsivity. She exhibits a depressed mood. She expresses suicidal ideation. She expresses suicidal plans.    Review of Systems  Musculoskeletal: Positive for back pain, falls and myalgias.  Psychiatric/Behavioral: Positive for depression and suicidal ideas. The patient is nervous/anxious and has insomnia.   All other systems reviewed and are negative.   Blood pressure 102/77, pulse 94, temperature 98.5 F (36.9 C), temperature source Oral, resp. rate 18, height 5\' 7"  (1.702 m), weight 113.6 kg (250 lb 8 oz), last menstrual period 04/07/2017, SpO2 100 %.Body mass index is 39.23 kg/m.  General Appearance: Casual  Eye Contact:  Good  Speech:  Clear and Coherent  Volume:  Normal  Mood:  Anxious and Depressed  Affect:  blunted  Thought Process:  Goal Directed and Descriptions of Associations: Intact  Orientation:  Full (Time, Place, and Person)  Thought Content:  Hallucinations: Auditory Visual, AH of whisper  Suicidal Thoughts:  denies  Homicidal Thoughts:  No  Memory:  Immediate;   Fair Recent;   Fair Remote;   Fair  Judgement:  Poor  Insight:  Shallow  Psychomotor Activity:  Psychomotor Retardation  Concentration:  Concentration: Fair and Attention Span: Fair  Recall:  [employer] of Knowledge:  Fair  Language:  Fair  Akathisia:  No  Handed:  Right  AIMS (if indicated):     Assets:  Communication Skills Desire for Improvement Financial Resources/Insurance Housing Resilience Social Support  ADL's:  Intact  Cognition:  WNL  Sleep:  Number of Hours: 7     Treatment Plan Summary: Daily contact with patient to assess and evaluate symptoms and progress in treatment and Medication management   Ms. Hoppe is a 46 year old female with a history of bipolar disorder and chronic pain admitted for worsening of depression and suicidal  ideation in the context of treatment noncompliance.  1. Suicidal ideation. The patient is able to contract for safety in the hospital.  2. Mood. We continued Cymbalta and started Risperdal for mood stabilization.    3. Anxiety. Vistaril is available.    4. Chronic pain. She is on Lyrica,  Cymbalta. She takes ibuprofen as needed.  5. Insomnia.  Sleeps better with Trazodone.  6. Hypertension. She is on Hydrochlorothiazide.  7. Urinary incontinence. She is on Ditropan.  8. Prediabetes. She is on an ADA diet.  9. Metabolic syndrome monitoring. Lipid panel shows elevated cholesterol and triglycerides, normal TSH, and hemoglobin A1c of 6.1.   10. EKG. Normal sinus rhythm, QTc 439.   11. Disposition. She will be discharge to her apartment. She will follow up with Dr. Robina Ade.   Orson Slick, MD

## 2017-04-15 NOTE — [facility] Group Notes (Signed)
[facility] Group Notes:  (Nursing/MHT/Case Management/Adjunct)  Date:  04/15/2017  Time:  4:38 PM  Type of Therapy:  Psychoeducational Skills  Participation Level:  Active  Participation Quality:  Appropriate, Attentive and Sharing  Affect:  Appropriate  Cognitive:  Alert  Insight:  Appropriate  Engagement in Group:  Engaged  Modes of Intervention:  Discussion, Education and Support  Summary of Progress/Problems:  Adela Lank [facility] 04/15/2017, 4:38 PM

## 2017-04-15 NOTE — Plan of Care (Signed)
Problem: Safety: Goal: Ability to disclose and discuss suicidal ideas will improve Outcome: Progressing Pt denies SI at this time  Problem: Safety: Goal: Ability to remain free from injury will improve Outcome: Progressing Pt safe on the unit at this time

## 2017-04-15 NOTE — Progress Notes (Signed)
Denies SI/HI/AVH.  Smiles easily upon approach.  Medication and group compliant.  Utilizes walker for ambulation without difficulty.  Support and encouragement offered.  Safety maintained.
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
## 2017-04-16 NOTE — Progress Notes (Signed)
Patient dc to home with boyfriend. Paperwork was gone over with boyfriend as well.  No issues noted. Patient excited to leave.

## 2017-04-16 NOTE — Progress Notes (Signed)
Patient verbalized understanding of dc papers and scripts. She is excited calls her boyfriend. She seems  Ready.

## 2017-04-16 NOTE — Progress Notes (Signed)
Patient up on the unit. She is social with select peers. She denies si, hi, vh. She still continues to have anxiety. She is encouraged to use her coping skills. She is pleasant and cooperative with staff. She denies pain.

## 2017-04-16 NOTE — BHH Suicide Risk Assessment (Signed)
[facility] Discharge Suicide Risk Assessment   Principal Problem: Bipolar I disorder, current or most recent episode depressed, with psychotic features [facility]) Discharge Diagnoses:  Patient Active Problem List   Diagnosis Date Noted  . Suicidal ideation [R45.851] 04/11/2017  . HTN (hypertension) [I10] 04/11/2017  . Chronic pain syndrome [G89.4] 04/11/2017  . PTSD (post-traumatic stress disorder) [F43.10] 04/11/2017  . OCD (obsessive compulsive disorder) [F42.9] 04/11/2017  . 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 TBI (traumatic brain injury) ([place]) [S06.9X9A] 04/11/2017  . Bipolar I disorder, current or most recent episode depressed, with psychotic features ([place]) [F31.5] 04/10/2017  . Difficulty concentrating [R41.840] 11/24/2015  . Abnormal bruising [R23.8] 10/26/2015  . Accumulation of fluid in tissues [R60.9] 10/26/2015  . Fibrositis [M79.7] 10/26/2015  . Cervical pain [M54.2] 10/26/2015  . Adiposity [E66.9] 10/26/2015  . 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 Oligomenorrhea [N91.5] 08/30/2015  . Weight gain [R63.5] 08/30/2015  . Costochondritis [M94.0] 04/14/2015  . Dyspnea [R06.00] 04/14/2015  . Recurrent vaginitis [N76.0]   . Clinical depression [F32.9] 09/21/2008  . Late effect of certain complications of trauma [S01.9XXS] 09/21/2008  . Anal bleeding [K62.5] 09/10/2008  . Headache, variant migraine, intractable [G43.819] 04/16/2006    Total Time spent with patient: 30 minutes  Musculoskeletal: Strength & Muscle Tone: within normal limits Gait & Station: unsteady Patient leans: N/A  Psychiatric Specialty Exam: Review of Systems  Musculoskeletal: Positive for back pain, falls and joint pain.  All other systems reviewed and are negative.   Blood pressure 114/69, pulse 76, temperature 98.1 F (36.7 C), temperature source Oral, resp. rate 20, height 5\' 7"  (1.702 m), weight 113.6 kg (250 lb 8 oz), last menstrual period 04/07/2017, SpO2 100 %.Body mass index  is 39.23 kg/m.  General Appearance: Casual  Eye Contact::  Good  Speech:  Clear and Coherent409  Volume:  Normal  Mood:  Anxious  Affect:  Appropriate  Thought Process:  Goal Directed and Descriptions of Associations: Intact  Orientation:  Full (Time, Place, and Person)  Thought Content:  WDL  Suicidal Thoughts:  No  Homicidal Thoughts:  No  Memory:  Immediate;   Fair Recent;   Fair Remote;   Fair  Judgement:  Impaired  Insight:  Shallow  Psychomotor Activity:  Normal  Concentration:  Fair  Recall:  [place]  Language: Fair  Akathisia:  No  Handed:  Right  AIMS (if indicated):     Assets:  Communication Skills Desire for Improvement Financial Resources/Insurance Housing Intimacy Resilience Social Support  Sleep:  Number of Hours: 6.75  Cognition: WNL  ADL's:  Intact   Mental Status Per Nursing Assessment::   On Admission:  NA  Demographic Factors:  Living alone  Loss Factors: NA  Historical Factors: Prior suicide attempts, Family history of mental illness or substance abuse and Impulsivity  Risk Reduction Factors:   Sense of responsibility to family, Positive social support and Positive therapeutic relationship  Continued Clinical Symptoms:  Bipolar Disorder:   Depressive phase Depression:   Severe Previous Psychiatric Diagnoses and Treatments Medical Diagnoses and Treatments/Surgeries  Cognitive Features That Contribute To Risk:  None    Suicide Risk:  Minimal: No identifiable suicidal ideation.  Patients presenting with no risk factors but with morbid ruminations; may be classified as minimal risk based on the severity of the depressive symptoms    Plan Of [place] tolerated.ollow-up recommendations:  Activity:  as tolerated. Diet:  low sodium heart healthy ADA diet. Other:  keep follow up appointments.  Orson Slick, MD 04/16/2017, 8:06 AM

## 2017-04-17 NOTE — Progress Notes (Signed)
  [facility] Adult Case Management Discharge Plan :  Will you be returning to the same living situation after discharge:  Yes,    At discharge, do you have transportation home?: Yes,    Do you have the ability to pay for your medications: Yes,     Release of information consent forms completed and in the chart;  Patient's signature needed at discharge.  Patient to Follow up at: Follow-up Information    [facility], [place] on 05/13/2017.   Why:  2:00.  Arrive 15 min early. Take ID and Ins card.  If you need to reschedule give at least 24 hr notice or they will not see you again in their clinic.  This is earliest available appointment. Contact information: [place] [address] [phone]        [place] on 04/18/2017.   Why:  Crisis Walk in Mon-Friday between 8am-3pm if you need to be seen prior to your scheduled appointment. Contact information: [place] [address] [phone]           Next level of care provider has access to [place] and Suicide Prevention discussed: Yes,     Have you used any form of tobacco in the last 30 days? (Cigarettes, Smokeless Tobacco, Cigars, and/or Pipes): No  Has patient been referred to the Quitline?: N/A patient is not a smoker  Patient has been referred for addiction treatment: Yes  August Saucer, LCSW 04/17/2017, 4:41 PM

## 2017-04-29 NOTE — Telephone Encounter (Signed)
AWV scheduled 

## 2017-05-08 ENCOUNTER — Ambulatory Visit: Payer: Self-pay

## 2017-05-09 ENCOUNTER — Encounter: Payer: Self-pay | Admitting: Physician Assistant

## 2017-05-09 ENCOUNTER — Ambulatory Visit (INDEPENDENT_AMBULATORY_CARE_PROVIDER_SITE_OTHER): Payer: Medicare HMO | Admitting: Physician Assistant

## 2017-05-09 VITALS — BP 110/80 | HR 78 | Temp 97.7°F | Resp 16 | Ht 67.0 in | Wt 254.0 lb

## 2017-05-09 DIAGNOSIS — F315 Bipolar disorder, current episode depressed, severe, with psychotic features: Secondary | ICD-10-CM | POA: Diagnosis not present

## 2017-05-09 DIAGNOSIS — T799XXS Unspecified early complication of trauma, sequela: Secondary | ICD-10-CM | POA: Diagnosis not present

## 2017-05-09 DIAGNOSIS — S069X9D Unspecified intracranial injury with loss of consciousness of unspecified duration, subsequent encounter: Secondary | ICD-10-CM | POA: Diagnosis not present

## 2017-05-09 DIAGNOSIS — G894 Chronic pain syndrome: Secondary | ICD-10-CM

## 2017-05-09 DIAGNOSIS — I10 Essential (primary) hypertension: Secondary | ICD-10-CM

## 2017-05-09 DIAGNOSIS — F4310 Post-traumatic stress disorder, unspecified: Secondary | ICD-10-CM

## 2017-05-09 DIAGNOSIS — E559 Vitamin D deficiency, unspecified: Secondary | ICD-10-CM

## 2017-05-09 DIAGNOSIS — Z23 Encounter for immunization: Secondary | ICD-10-CM | POA: Diagnosis not present

## 2017-05-09 MED ORDER — VITAMIN D (ERGOCALCIFEROL) 1.25 MG (50000 UNIT) PO CAPS: 50000.0000 [IU] | ORAL_CAPSULE | ORAL | 1 refills | Status: DC

## 2017-05-09 MED ORDER — PREGABALIN 200 MG PO CAPS: 200.0000 mg | ORAL_CAPSULE | Freq: Two times a day (BID) | ORAL | 1 refills | Status: DC

## 2017-05-09 MED ORDER — HYDROCHLOROTHIAZIDE 25 MG PO TABS: 25.0000 mg | ORAL_TABLET | Freq: Every day | ORAL | 1 refills | Status: DC

## 2017-05-09 NOTE — Patient Instructions (Signed)
Pregabalin capsules What is this medicine? PREGABALIN (pre GAB a lin) is used to treat nerve pain from diabetes, shingles, spinal cord injury, and fibromyalgia. It is also used to control seizures in epilepsy. This medicine may be used for other purposes; ask your health care provider or pharmacist if you have questions. COMMON BRAND NAME(S): Lyrica What should I tell my health care provider before I take this medicine? They need to know if you have any of these conditions: -bleeding problems -heart disease, including heart failure -history of alcohol or drug abuse -kidney disease -suicidal thoughts, plans, or attempt; a previous suicide attempt by you or a family member -an unusual or allergic reaction to pregabalin, gabapentin, other medicines, foods, dyes, or preservatives -pregnant or trying to get pregnant or trying to conceive with your partner -breast-feeding How should I use this medicine? Take this medicine by mouth with a glass of water. Follow the directions on the prescription label. You can take this medicine with or without food. Take your doses at regular intervals. Do not take your medicine more often than directed. Do not stop taking except on your doctor's advice. A special MedGuide will be given to you by the pharmacist with each prescription and refill. Be sure to read this information carefully each time. Talk to your pediatrician regarding the use of this medicine in children. Special care may be needed. Overdosage: If you think you have taken too much of this medicine contact a poison control center or emergency room at once. NOTE: This medicine is only for you. Do not share this medicine with others. What if I miss a dose? If you miss a dose, take it as soon as you can. If it is almost time for your next dose, take only that dose. Do not take double or extra doses. What may interact with this medicine? -alcohol -certain medicines for blood pressure like captopril,  enalapril, or lisinopril -certain medicines for diabetes, like pioglitazone or rosiglitazone -certain medicines for anxiety or sleep -narcotic medicines for pain This list may not describe all possible interactions. Give your health care provider a list of all the medicines, herbs, non-prescription drugs, or dietary supplements you use. Also tell them if you smoke, drink alcohol, or use illegal drugs. Some items may interact with your medicine. What should I watch for while using this medicine? Tell your doctor or healthcare professional if your symptoms do not start to get better or if they get worse. Visit your doctor or health care professional for regular checks on your progress. Do not stop taking except on your doctor's advice. You may develop a severe reaction. Your doctor will tell you how much medicine to take. Wear a medical identification bracelet or chain if you are taking this medicine for seizures, and carry a card that describes your disease and details of your medicine and dosage times. You may get drowsy or dizzy. Do not drive, use machinery, or do anything that needs mental alertness until you know how this medicine affects you. Do not stand or sit up quickly, especially if you are an older patient. This reduces the risk of dizzy or fainting spells. Alcohol may interfere with the effect of this medicine. Avoid alcoholic drinks. If you have a heart condition, like congestive heart failure, and notice that you are retaining water and have swelling in your hands or feet, contact your health care provider immediately. The use of this medicine may increase the chance of suicidal thoughts or actions. Pay special attention   to how you are responding while on this medicine. Any worsening of mood, or thoughts of suicide or dying should be reported to your health care professional right away. This medicine has caused reduced sperm counts in some men. This may interfere with the ability to father a  child. You should talk to your doctor or health care professional if you are concerned about your fertility. Women who become pregnant while using this medicine for seizures may enroll in the [place] American Antiepileptic Drug Pregnancy Registry by calling 1-888-233-2334. This registry collects information about the safety of antiepileptic drug use during pregnancy. What side effects may I notice from receiving this medicine? Side effects that you should report to your doctor or health care professional as soon as possible: -allergic reactions like skin rash, itching or hives, swelling of the face, lips, or tongue -breathing problems -changes in vision -chest pain -confusion -jerking or unusual movements of any part of your body -loss of memory -muscle pain, tenderness, or weakness -suicidal thoughts or other mood changes -swelling of the ankles, feet, hands -unusual bruising or bleeding Side effects that usually do not require medical attention (report to your doctor or health care professional if they continue or are bothersome): -dizziness -drowsiness -dry mouth -headache -nausea -tremors -trouble sleeping -weight gain This list may not describe all possible side effects. Call your doctor for medical advice about side effects. You may report side effects to FDA at 1-800-FDA-1088. Where should I keep my medicine? Keep out of the reach of children. This medicine can be abused. Keep your medicine in a safe place to protect it from theft. Do not share this medicine with anyone. Selling or giving away this medicine is dangerous and against the law. This medicine may cause accidental overdose and death if it taken by other adults, children, or pets. Mix any unused medicine with a substance like cat litter or coffee grounds. Then throw the medicine away in a sealed container like a sealed bag or a coffee can with a lid. Do not use the medicine after the expiration date. Store at room  temperature between 15 and 30 degrees C (59 and 86 degrees F). NOTE: This sheet is a summary. It may not cover all possible information. If you have questions about this medicine, talk to your doctor, pharmacist, or health care provider.  2018 Elsevier/Gold Standard (2015-08-18 10:26:12)  

## 2017-05-09 NOTE — Progress Notes (Signed)
Patient: Barbara Anderson Female    DOB: [birth year]   46 y.o.   MRN: [id number] Visit Date: 05/09/2017  Today's Provider: Mar Daring, PA-C   Chief Complaint  Patient presents with  . Follow-up   Subjective:    HPI  Follow up ER visit  Patient was seen in ER for major depressive disorder on 04/09/2017 and discharged on 04/16/17. She was treated for bipolar disorder and chronic pain. Treatment for this included continue taking current medications and start Risperdal and hydroxyzine . She reports excellent compliance with treatment. She reports this condition is Improved. ------------------------------------------------------------------------------------  Patient reports she is having swelling on feet, ankles and hands. Patient requesting to increase HCTZ.   Follow up for chronic pain  The patient was last seen for this  several months ago. Changes made at last visit include no changes.  She reports excellent compliance with treatment. She feels that condition is Improved. She is not having side effects.  Patient requesting refill on Vitamin D.  Patient also requesting refill for Lyrica.     No Known Allergies   Current Outpatient Prescriptions:  .  DULoxetine (CYMBALTA) 60 MG capsule, Take 2 capsules (120 mg total) by mouth daily., Disp: 60 capsule, Rfl: 0 .  FOLIC ACID PO, Take 1 tablet by mouth daily. Reported on 08/30/2015, Disp: , Rfl:  .  hydrochlorothiazide (MICROZIDE) 12.5 MG capsule, TAKE 1 CAPSULE TWICE DAILY, Disp: 180 capsule, Rfl: 1 .  hydrOXYzine (ATARAX/VISTARIL) 50 MG tablet, Take 1 tablet (50 mg total) by mouth 3 (three) times daily as needed for anxiety., Disp: 90 tablet, Rfl: 1 .  oxybutynin (DITROPAN-XL) 5 MG 24 hr tablet, Take 1 tablet (5 mg total) by mouth at bedtime., Disp: 90 tablet, Rfl: 0 .  pregabalin (LYRICA) 200 MG capsule, Take 1 capsule (200 mg total) by mouth 2 (two) times daily., Disp: 60 capsule, Rfl: 0 .  risperiDONE  (RISPERDAL) 1 MG tablet, Take 1 tablet (1 mg total) by mouth at bedtime., Disp: 30 tablet, Rfl: 1 .  topiramate (TOPAMAX) 200 MG tablet, Take 1 tablet (200 mg total) by mouth at bedtime., Disp: 30 tablet, Rfl: 1 .  traZODone (DESYREL) 150 MG tablet, Take 1 tablet (150 mg total) by mouth at bedtime., Disp: 30 tablet, Rfl: 1 .  Vitamin D, Ergocalciferol, (DRISDOL) 50000 units CAPS capsule, Take 50,000 Units by mouth every 7 (seven) days., Disp: , Rfl:   Review of Systems  Constitutional: Positive for activity change.  Respiratory: Negative.   Cardiovascular: Positive for leg swelling.  Gastrointestinal: Negative.   Neurological: Negative.   Psychiatric/Behavioral: Positive for confusion, decreased concentration and sleep disturbance. The patient is nervous/anxious.     Social History  Substance Use Topics  . Smoking status: Never Smoker  . Smokeless tobacco: Never Used  . Alcohol use No   Objective:   BP 110/80 (BP Location: Left Arm, Patient Position: Sitting, Cuff Size: Large)   Pulse 78   Temp 97.7 F (36.5 C) (Oral)   Resp 16   Ht 5\' 7"  (1.702 m)   Wt 254 lb (115.2 kg)   SpO2 99%   BMI 39.78 kg/m  Vitals:   05/09/17 1543  BP: 110/80  Pulse: 78  Resp: 16  Temp: 97.7 F (36.5 C)  TempSrc: Oral  SpO2: 99%  Weight: 254 lb (115.2 kg)  Height: 5\' 7"  (1.702 m)     Physical Exam  Constitutional: She appears well-developed and well-nourished. No distress.  Neck:  Normal range of motion. Neck supple. No JVD present. No tracheal deviation present. No thyromegaly present.  Cardiovascular: Normal rate, regular rhythm and normal heart sounds.  Exam reveals no gallop and no friction rub.   No murmur heard. Pulmonary/Chest: Effort normal and breath sounds normal. No respiratory distress. She has no wheezes. She has no rales.  Lymphadenopathy:    She has no cervical adenopathy.  Skin: She is not diaphoretic.  Psychiatric: Her speech is normal and behavior is normal. Thought  content normal. Her affect is blunt. She expresses no homicidal and no suicidal ideation.  Vitals reviewed.      Assessment & Plan:     1. Essential hypertension Stable. Diagnosis pulled for medication refill. Continue current medical treatment plan.  - hydrochlorothiazide (HYDRODIURIL) 25 MG tablet; Take 1 tablet (25 mg total) by mouth daily.  Dispense: 90 tablet; Refill: 1  2. Traumatic brain injury with loss of consciousness, subsequent encounter Patient currently of Dr. Primus Bravo but patient is losing insurance and would like to transfer care to [facility] for financial assistance. Referral placed as below.  - Ambulatory referral to Pain Clinic  3. Bipolar I disorder, current or most recent episode depressed, with psychotic features ([place]) Currently stable with new medications. Continues psychiatric care and has been compliant with follow ups.  4. PTSD (post-traumatic stress disorder) See above medical treatment plan.  5. Chronic pain syndrome Stable. Diagnosis pulled for medication refill. Continue current medical treatment plan. - pregabalin (LYRICA) 200 MG capsule; Take 1 capsule (200 mg total) by mouth 2 (two) times daily.  Dispense: 180 capsule; Refill: 1 - Ambulatory referral to Pain Clinic  6. 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  - Ambulatory referral to Pain Clinic  7. Vitamin D deficiency Stable. Diagnosis pulled for medication refill. Continue current medical treatment plan. - Vitamin D, Ergocalciferol, (DRISDOL) 50000 units CAPS capsule; Take 1 capsule (50,000 Units total) by mouth every 7 (seven) days.  Dispense: 12 capsule; Refill: 1  8. Need for influenza vaccination Flu vaccine given today without complication. Patient sat upright for 15 minutes to check for adverse reaction before being released. - Flu Vaccine QUAD 36+ mos IM       Mar Daring, PA-C  [facility]

## 2017-05-27 ENCOUNTER — Telehealth: Payer: Self-pay | Admitting: Physician Assistant

## 2017-05-27 DIAGNOSIS — E559 Vitamin D deficiency, unspecified: Secondary | ICD-10-CM

## 2017-05-27 DIAGNOSIS — G894 Chronic pain syndrome: Secondary | ICD-10-CM

## 2017-05-27 MED ORDER — FOLIC ACID 5 MG PO CAPS: 1.0000 | ORAL_CAPSULE | Freq: Every day | ORAL | 1 refills | Status: DC

## 2017-05-27 MED ORDER — VITAMIN D (ERGOCALCIFEROL) 1.25 MG (50000 UNIT) PO CAPS: 50000.0000 [IU] | ORAL_CAPSULE | ORAL | 1 refills | Status: DC

## 2017-05-27 MED ORDER — PREGABALIN 200 MG PO CAPS: 200.0000 mg | ORAL_CAPSULE | Freq: Two times a day (BID) | ORAL | 1 refills | Status: DC

## 2017-05-27 NOTE — Telephone Encounter (Signed)
Vit D and folic acid sent to [facility].  Please phone in Lyrica 200mg  capsule Take 1 capsule PO BID #180 1 refill

## 2017-05-27 NOTE — Telephone Encounter (Signed)
Called into [facility]

## 2017-05-27 NOTE — Telephone Encounter (Signed)
Pt contacted office for refill request on the following medications:  1. FOLIC ACID PO 2. pregabalin (LYRICA) 200 MG capsule 3. Vitamin D, Ergocalciferol, (DRISDOL) 50000 units CAPS capsule  [facility] [address]  Pt stated the medications were sent to [facility] and she needs them sent to [facility] b/c she can get them cheaper there. Pt stated that Tawanna Sat was going to be taking over these medications. Please advise. Thanks TNP

## 2017-05-27 NOTE — Telephone Encounter (Signed)
Pt request a call back to get update on her referral for pain management. Thanks TNP

## 2017-05-28 NOTE — Telephone Encounter (Signed)
Pt advised that I am waiting on records from previous pain clinic in order to begin referral to [facility] pain clinic

## 2017-05-29 ENCOUNTER — Other Ambulatory Visit: Payer: Self-pay | Admitting: Physician Assistant

## 2017-05-29 NOTE — Progress Notes (Signed)
Per patient she doesn't know because she is has not taken it for a while.   Thanks,  -Deoni Cosey

## 2017-05-29 NOTE — Progress Notes (Signed)
Ok. I had received a refill request, but if she is not taking no need to refill. She can get folic acid OTC if needed.

## 2017-05-29 NOTE — Progress Notes (Signed)
Have refill for folic acid from [facility] but unsure dose patient takes. Can we verify?  Thanks. JB

## 2017-06-12 ENCOUNTER — Other Ambulatory Visit: Payer: Self-pay | Admitting: Physician Assistant

## 2017-06-13 ENCOUNTER — Telehealth: Payer: Self-pay | Admitting: Physician Assistant

## 2017-06-13 MED ORDER — FOLIC ACID 1 MG PO TABS: 1.0000 mg | ORAL_TABLET | Freq: Every day | ORAL | 1 refills | Status: DC

## 2017-06-13 NOTE — Telephone Encounter (Signed)
Folic acid 1mg  refilled

## 2017-06-27 ENCOUNTER — Other Ambulatory Visit: Payer: Self-pay | Admitting: Physician Assistant

## 2017-06-27 DIAGNOSIS — I10 Essential (primary) hypertension: Secondary | ICD-10-CM

## 2017-06-27 MED ORDER — HYDROCHLOROTHIAZIDE 25 MG PO TABS: 25.0000 mg | ORAL_TABLET | Freq: Every day | ORAL | 1 refills | Status: DC

## 2017-06-27 NOTE — Telephone Encounter (Signed)
[facility] pharmacy faxed a request for the following medication. Thanks CC  hydrochlorothiazide (HYDRODIURIL) 25 MG tablet

## 2017-07-11 ENCOUNTER — Other Ambulatory Visit: Payer: Self-pay | Admitting: Physician Assistant

## 2017-07-11 ENCOUNTER — Telehealth: Payer: Self-pay | Admitting: Physician Assistant

## 2017-07-11 DIAGNOSIS — G894 Chronic pain syndrome: Secondary | ICD-10-CM

## 2017-07-11 NOTE — Telephone Encounter (Signed)
Pt contacted office for refill request on the following medications:  pregabalin (LYRICA) 200 MG capsule  Pt states this is very expensive so she is asking if we will call [employer] mail order to request a tier break to get to cost reduces.  [phone]  [place]#[phone]/TD

## 2017-07-11 NOTE — Telephone Encounter (Signed)
Patient had called and wanted [place] to call [place] for tier exception for Lyrica due to cost.   To get a tier exception she has to initiate this with the pharmacy herself and then they normally will fax [place] paperwork to complete. The pharmacy may request financial information from her to allow tier exception which is information we do not have to give.

## 2017-07-11 NOTE — Telephone Encounter (Signed)
Last ov 05/09/17  Last filled 05/27/17 Please review. Thank you. sd

## 2017-07-11 NOTE — Telephone Encounter (Addendum)
Error

## 2017-07-11 NOTE — Telephone Encounter (Signed)
Left message to call back  

## 2017-07-15 NOTE — Telephone Encounter (Signed)
Advised patient as below.  

## 2017-08-27 DIAGNOSIS — H524 Presbyopia: Secondary | ICD-10-CM | POA: Diagnosis not present

## 2017-09-30 ENCOUNTER — Other Ambulatory Visit: Payer: Self-pay | Admitting: Women's Health

## 2017-09-30 DIAGNOSIS — N3281 Overactive bladder: Secondary | ICD-10-CM

## 2017-10-02 ENCOUNTER — Other Ambulatory Visit: Payer: Self-pay | Admitting: Physician Assistant

## 2018-01-29 ENCOUNTER — Other Ambulatory Visit: Payer: Self-pay

## 2018-01-29 DIAGNOSIS — N3281 Overactive bladder: Secondary | ICD-10-CM

## 2018-02-03 ENCOUNTER — Other Ambulatory Visit: Payer: Self-pay | Admitting: Physician Assistant

## 2018-02-03 DIAGNOSIS — E559 Vitamin D deficiency, unspecified: Secondary | ICD-10-CM

## 2018-05-21 ENCOUNTER — Ambulatory Visit (INDEPENDENT_AMBULATORY_CARE_PROVIDER_SITE_OTHER): Payer: Medicare HMO | Admitting: Family Medicine

## 2018-05-21 ENCOUNTER — Ambulatory Visit
Admission: RE | Admit: 2018-05-21 | Discharge: 2018-05-21 | Disposition: A | Discharge status: Home / Self Care | Payer: Medicare HMO | Source: Ambulatory Visit | Attending: Family Medicine | Admitting: Family Medicine

## 2018-05-21 ENCOUNTER — Encounter: Payer: Self-pay | Admitting: Family Medicine

## 2018-05-21 VITALS — BP 160/100 | HR 90 | Temp 98.5°F | Resp 16 | Wt 287.6 lb

## 2018-05-21 DIAGNOSIS — R8290 Unspecified abnormal findings in urine: Secondary | ICD-10-CM | POA: Diagnosis not present

## 2018-05-21 DIAGNOSIS — K5900 Constipation, unspecified: Secondary | ICD-10-CM | POA: Insufficient documentation

## 2018-05-21 DIAGNOSIS — R109 Unspecified abdominal pain: Secondary | ICD-10-CM | POA: Diagnosis not present

## 2018-05-21 DIAGNOSIS — K130 Diseases of lips: Secondary | ICD-10-CM

## 2018-05-21 DIAGNOSIS — Z23 Encounter for immunization: Secondary | ICD-10-CM | POA: Diagnosis not present

## 2018-05-21 LAB — POCT URINALYSIS DIPSTICK
Appearance: NORMAL
Bilirubin, UA: NEGATIVE
Blood, UA: NEGATIVE
Glucose, UA: NEGATIVE
Ketones, UA: NEGATIVE
LEUKOCYTES UA: NEGATIVE
NITRITE UA: NEGATIVE
ODOR: NEGATIVE
PH UA: 6 (ref 5.0–8.0)
PROTEIN UA: POSITIVE — AB
SPEC GRAV UA: 1.015 (ref 1.010–1.025)
Urobilinogen, UA: 0.2 E.U./dL

## 2018-05-21 MED ORDER — CLOTRIMAZOLE 1 % EX CREA: 1.0000 "application " | TOPICAL_CREAM | Freq: Two times a day (BID) | CUTANEOUS | 0 refills | Status: DC

## 2018-05-21 NOTE — Progress Notes (Signed)
Patient: Barbara Anderson Female    DOB: 1970-11-09   47 y.o.   MRN: [id number] Visit Date: 05/21/2018  Today's Provider: Lavon Paganini, MD   Chief Complaint  Patient presents with  . Constipation   Subjective:    Constipation  This is a chronic problem. The current episode started 1 to 4 weeks ago (worsening in the past 2 weeks. She reports that she didn't have a bowel movement at all for 2 weeks until this Sunday after taking 4 Laxatives total.). The stool is described as pellet like and formed. The patient is on a high fiber diet. She does not exercise regularly. There has been adequate water intake (8 glasses a day of 12oz). Associated symptoms include abdominal pain, bloating, flatus and nausea ("Friday and Saturday"). Pertinent negatives include no diarrhea, difficulty urinating, fecal incontinence, fever or vomiting. Associated symptoms comments: difficulty  Urinating and urine smells bad.. She has tried laxatives and stool softeners (took Laxatives on Friday and Saturday and reports that she finallly was able to go on Sunday. Patient reports stool softener and Miralax don't work.) for the symptoms. The treatment provided no relief (Reports that after Sunday bowel movement she has not had any more bowels.).   She reports decreased appetite, but is able to keep food down without any vomiting.  She denies any melena, but did have some bright red blood on the toilet paper after passing her bowel movement Sunday after going 2 weeks without a bowel movement.  She is now got another 3 days without a bowel movement since the last one.  She only tried 3-4 doses of stool softener 1 dose of MiraLAX before trying the laxative.  She is having abdominal cramping diffusely.  She reports that she also has notice some "ringworm" patches for a week or so around her mouth. She used Vitamin E cream all over her face.    No Known Allergies   Current Outpatient Medications:  .  Alcohol Swabs (B-D  SINGLE USE SWABS REGULAR) PADS, USE TO CLEANSE SKIN DAILY BEFORE CHECKING BLOOD SUGAR, Disp: 100 each, Rfl: 1 .  DULoxetine (CYMBALTA) 60 MG capsule, Take 2 capsules (120 mg total) by mouth daily., Disp: 60 capsule, Rfl: 0 .  folic acid (FOLVITE) 1 MG tablet, Take 1 tablet (1 mg total) daily by mouth., Disp: 90 tablet, Rfl: 1 .  hydrochlorothiazide (HYDRODIURIL) 25 MG tablet, Take 1 tablet (25 mg total) by mouth daily., Disp: 90 tablet, Rfl: 1 .  hydrOXYzine (ATARAX/VISTARIL) 50 MG tablet, Take 1 tablet (50 mg total) by mouth 3 (three) times daily as needed for anxiety., Disp: 90 tablet, Rfl: 1 .  pregabalin (LYRICA) 200 MG capsule, Take 1 capsule (200 mg total) by mouth 2 (two) times daily., Disp: 180 capsule, Rfl: 1 .  risperiDONE (RISPERDAL) 1 MG tablet, Take 1 tablet (1 mg total) by mouth at bedtime., Disp: 30 tablet, Rfl: 1 .  topiramate (TOPAMAX) 200 MG tablet, Take 1 tablet (200 mg total) by mouth at bedtime., Disp: 30 tablet, Rfl: 1 .  traZODone (DESYREL) 150 MG tablet, Take 1 tablet (150 mg total) by mouth at bedtime., Disp: 30 tablet, Rfl: 1 .  Vitamin D, Ergocalciferol, (DRISDOL) 50000 units CAPS capsule, TAKE 1 CAPSULE (50,000 UNITS TOTAL) BY MOUTH EVERY 7  DAYS., Disp: 12 capsule, Rfl: 1 .  oxybutynin (DITROPAN-XL) 5 MG 24 hr tablet, Take 1 tablet (5 mg total) by mouth at bedtime. (Patient not taking: Reported on 05/21/2018), Disp: 90 tablet,  Rfl: 0  Review of Systems  Constitutional: Positive for appetite change. Negative for activity change, chills, diaphoresis, fatigue, fever and unexpected weight change.  HENT: Negative.   Respiratory: Negative.   Cardiovascular: Negative.   Gastrointestinal: Positive for abdominal distention, abdominal pain, bloating, blood in stool ("dark and hard"), constipation, flatus and nausea ("Friday and Saturday"). Negative for diarrhea and vomiting.  Genitourinary: Negative for difficulty urinating, dysuria, genital sores, hematuria and pelvic pain.    Neurological: Negative.   Psychiatric/Behavioral: Negative.     Social History   Tobacco Use  . Smoking status: Never Smoker  . Smokeless tobacco: Never Used  Substance Use Topics  . Alcohol use: No    Alcohol/week: 0.0 standard drinks   Objective:   Resp 16   Wt 287 lb 9.6 oz (130.5 kg)   BMI 45.04 kg/m  Vitals:   05/21/18 1516  Resp: 16  Weight: 287 lb 9.6 oz (130.5 kg)     Physical Exam  Constitutional: She is oriented to person, place, and time. She appears well-developed and well-nourished. No distress.  HENT:  Head: Normocephalic and atraumatic.  Mouth/Throat: Oropharynx is clear and moist. No oropharyngeal exudate.  Eyes: Conjunctivae are normal. No scleral icterus.  Neck: Neck supple. No thyromegaly present.  Cardiovascular: Normal rate, regular rhythm, normal heart sounds and intact distal pulses.  No murmur heard. Pulmonary/Chest: Effort normal and breath sounds normal. No respiratory distress. She has no wheezes. She has no rales.  Abdominal: Soft. She exhibits no distension and no mass. There is tenderness (diffusely). There is no rebound and no guarding.  Musculoskeletal: She exhibits no edema.  Lymphadenopathy:    She has no cervical adenopathy.  Neurological: She is alert and oriented to person, place, and time.  Skin: Skin is warm and dry. Capillary refill takes less than 2 seconds. No rash noted.  Angular cheilitis  Psychiatric: She has a normal mood and affect. Her behavior is normal.  Vitals reviewed.   Results for orders placed or performed in visit on 05/21/18  POCT urinalysis dipstick  Result Value Ref Range   Color, UA Yellow    Clarity, UA Clear    Glucose, UA Negative Negative   Bilirubin, UA Negative    Ketones, UA Negative    Spec Grav, UA 1.015 1.010 - 1.025   Blood, UA Negative    pH, UA 6.0 5.0 - 8.0   Protein, UA Positive (A) Negative   Urobilinogen, UA 0.2 0.2 or 1.0 E.U./dL   Nitrite, UA Negative    Leukocytes, UA Negative  Negative   Appearance Normal    Odor Negative        Assessment & Plan:   1. Constipation, unspecified constipation type - long-standing intermittent problem, but worse in last few weeks - no signs of bowel obstruction as she is tolerating PO intake and has no vomiting - will obtain Abd Xray to assess amount of stool present - will do miralax clean out - see AVS - also take stool softeners tonight - then after having BM, continue miralax daily and titrate to 1 soft BM daily - discussed retunr precautions - DG Abd 2 Views; Future  2. Bad odor of urine - UA benign with no signs of UTI - its possible that proteinuria could be causing odor - POCT urinalysis dipstick  3. Need for influenza vaccination - Flu Vaccine QUAD 36+ mos IM  4. Angular cheilitis - rash around mouth around Bilateral corners of lips -Consistent with angular cheilitis -  treat with clotrimazole BID   Meds ordered this encounter  Medications  . clotrimazole (LOTRIMIN) 1 % cream    Sig: Apply 1 application topically 2 (two) times daily.    Dispense:  30 g    Refill:  0     Return if symptoms worsen or fail to improve.   The entirety of the information documented in the History of Present Illness, Review of Systems and Physical Exam were personally obtained by me. Portions of this information were initially documented by Lyndel Pleasure, CMA and reviewed by me for thoroughness and accuracy.    Virginia Crews, MD, MPH [facility] 05/22/2018 1:19 PM

## 2018-05-21 NOTE — Patient Instructions (Addendum)
Tonight: Divide 1 medium miralax into 2 medium gatorades (G2 or G0, not red) and drink them both  This will clean you out Can also take 2 stool softeners with them  After clean out, take 1-2 cap fulls of Miralax daily to have 1 soft bowel movement daily   Clotrimazole cream twice daily for lip    Constipation, Adult Constipation is when a person has fewer bowel movements in a week than normal, has difficulty having a bowel movement, or has stools that are dry, hard, or larger than normal. Constipation may be caused by an underlying condition. It may become worse with age if a person takes certain medicines and does not take in enough fluids. Follow these instructions at home: Eating and drinking   Eat foods that have a lot of fiber, such as fresh fruits and vegetables, whole grains, and beans.  Limit foods that are high in fat, low in fiber, or overly processed, such as french fries, hamburgers, cookies, candies, and soda.  Drink enough fluid to keep your urine clear or pale yellow. General instructions  Exercise regularly or as told by your health care provider.  Go to the restroom when you have the urge to go. Do not hold it in.  Take over-the-counter and prescription medicines only as told by your health care provider. These include any fiber supplements.  Practice pelvic floor retraining exercises, such as deep breathing while relaxing the lower abdomen and pelvic floor relaxation during bowel movements.  Watch your condition for any changes.  Keep all follow-up visits as told by your health care provider. This is important. Contact a health care provider if:  You have pain that gets worse.  You have a fever.  You do not have a bowel movement after 4 days.  You vomit.  You are not hungry.  You lose weight.  You are bleeding from the anus.  You have thin, pencil-like stools. Get help right away if:  You have a fever and your symptoms suddenly get  worse.  You leak stool or have blood in your stool.  Your abdomen is bloated.  You have severe pain in your abdomen.  You feel dizzy or you faint. This information is not intended to replace advice given to you by your health care provider. Make sure you discuss any questions you have with your health care provider. Document Released: 04/13/2004 Document Revised: 02/03/2016 Document Reviewed: 01/04/2016 Elsevier Interactive Patient Education  2018 [employer].

## 2018-05-29 ENCOUNTER — Other Ambulatory Visit: Payer: Self-pay | Admitting: Physician Assistant

## 2018-05-29 DIAGNOSIS — I10 Essential (primary) hypertension: Secondary | ICD-10-CM

## 2018-05-30 NOTE — Telephone Encounter (Signed)
Jenni's patient

## 2018-05-30 NOTE — Telephone Encounter (Signed)
Needs to be scheduled with Tawanna Sat, hasn't been seen in > 1 year.

## 2018-06-02 NOTE — Telephone Encounter (Signed)
Patient scheduled for Nov 18

## 2018-06-11 ENCOUNTER — Other Ambulatory Visit: Payer: Self-pay | Admitting: Physician Assistant

## 2018-06-11 DIAGNOSIS — E559 Vitamin D deficiency, unspecified: Secondary | ICD-10-CM

## 2018-06-16 ENCOUNTER — Encounter: Payer: Self-pay | Admitting: Physician Assistant

## 2018-06-16 ENCOUNTER — Ambulatory Visit (INDEPENDENT_AMBULATORY_CARE_PROVIDER_SITE_OTHER): Payer: Medicare HMO | Admitting: Physician Assistant

## 2018-06-16 VITALS — BP 136/89 | HR 94 | Temp 98.4°F | Resp 16 | Wt 288.6 lb

## 2018-06-16 DIAGNOSIS — G894 Chronic pain syndrome: Secondary | ICD-10-CM

## 2018-06-16 DIAGNOSIS — Z6841 Body Mass Index (BMI) 40.0 and over, adult: Secondary | ICD-10-CM

## 2018-06-16 DIAGNOSIS — I890 Lymphedema, not elsewhere classified: Secondary | ICD-10-CM

## 2018-06-16 DIAGNOSIS — I10 Essential (primary) hypertension: Secondary | ICD-10-CM | POA: Diagnosis not present

## 2018-06-16 DIAGNOSIS — J40 Bronchitis, not specified as acute or chronic: Secondary | ICD-10-CM | POA: Diagnosis not present

## 2018-06-16 DIAGNOSIS — F315 Bipolar disorder, current episode depressed, severe, with psychotic features: Secondary | ICD-10-CM | POA: Diagnosis not present

## 2018-06-16 DIAGNOSIS — S069X9D Unspecified intracranial injury with loss of consciousness of unspecified duration, subsequent encounter: Secondary | ICD-10-CM | POA: Diagnosis not present

## 2018-06-16 MED ORDER — AZITHROMYCIN 250 MG PO TABS: ORAL_TABLET | ORAL | 0 refills | Status: DC

## 2018-06-16 MED ORDER — POTASSIUM CHLORIDE ER 10 MEQ PO TBCR: 10.0000 meq | EXTENDED_RELEASE_TABLET | Freq: Every day | ORAL | 1 refills | Status: DC

## 2018-06-16 MED ORDER — PREGABALIN 200 MG PO CAPS: 200.0000 mg | ORAL_CAPSULE | Freq: Two times a day (BID) | ORAL | 1 refills | Status: DC

## 2018-06-16 MED ORDER — FUROSEMIDE 20 MG PO TABS: 20.0000 mg | ORAL_TABLET | Freq: Every day | ORAL | 1 refills | Status: DC

## 2018-06-16 MED ORDER — PREDNISONE 10 MG (21) PO TBPK
ORAL_TABLET | ORAL | 0 refills | Status: DC
Start: 2018-06-16 — End: 2018-06-23

## 2018-06-16 NOTE — Progress Notes (Signed)
Patient: Barbara Anderson Female    DOB: 11-21-1970   47 y.o.   MRN: [id number] Visit Date: 06/16/2018  Today's Provider: Mar Daring, PA-C   Chief Complaint  Patient presents with  . Follow-up   Subjective:    HPI Essential Hypertension: Patient's blood pressure stable. Patient reports she is adherent to low salt diet. Patient is checking her blood pressure at home. She reports that her BP are in the normal range 120's/70's. She reports the following symptoms:Edema, lower extremities, and on hands. Reports that the Hydrochlorothiazide is not helping.   Follow-up chronic pain: Patient reports that her pain symptoms is stable. She takes Lyrica 200 mg capsule.Patient was referred to the pain clinic last year.Reports that overall with the Lyrica pain is stable but today is hurting a lot all over.  Bipolar I Disorder: patient reports she feels stable with new medication. She followed up by the psychiatrist. Reports that her last visit with the psychiatrist was last month. Dr.Kaur is her psychiatrist.     No Known Allergies   Current Outpatient Medications:  .  Alcohol Swabs (B-D SINGLE USE SWABS REGULAR) PADS, USE TO CLEANSE SKIN DAILY BEFORE CHECKING BLOOD SUGAR, Disp: 100 each, Rfl: 1 .  clotrimazole (LOTRIMIN) 1 % cream, Apply 1 application topically 2 (two) times daily., Disp: 30 g, Rfl: 0 .  DULoxetine (CYMBALTA) 60 MG capsule, Take 2 capsules (120 mg total) by mouth daily., Disp: 60 capsule, Rfl: 0 .  folic acid (FOLVITE) 1 MG tablet, Take 1 tablet (1 mg total) daily by mouth., Disp: 90 tablet, Rfl: 1 .  hydrochlorothiazide (HYDRODIURIL) 25 MG tablet, TAKE 1 TABLET EVERY DAY, Disp: 30 tablet, Rfl: 0 .  hydrOXYzine (ATARAX/VISTARIL) 50 MG tablet, Take 1 tablet (50 mg total) by mouth 3 (three) times daily as needed for anxiety., Disp: 90 tablet, Rfl: 1 .  pregabalin (LYRICA) 200 MG capsule, Take 1 capsule (200 mg total) by mouth 2 (two) times daily., Disp: 180 capsule,  Rfl: 1 .  risperiDONE (RISPERDAL) 1 MG tablet, Take 1 tablet (1 mg total) by mouth at bedtime., Disp: 30 tablet, Rfl: 1 .  topiramate (TOPAMAX) 200 MG tablet, Take 1 tablet (200 mg total) by mouth at bedtime., Disp: 30 tablet, Rfl: 1 .  traZODone (DESYREL) 150 MG tablet, Take 1 tablet (150 mg total) by mouth at bedtime., Disp: 30 tablet, Rfl: 1 .  Vitamin D, Ergocalciferol, (DRISDOL) 1.25 MG (50000 UT) CAPS capsule, TAKE 1 CAPSULE EVERY 7 DAYS, Disp: 12 capsule, Rfl: 0 .  oxybutynin (DITROPAN-XL) 5 MG 24 hr tablet, Take 1 tablet (5 mg total) by mouth at bedtime. (Patient not taking: Reported on 05/21/2018), Disp: 90 tablet, Rfl: 0  Review of Systems  Constitutional: Negative.   HENT: Negative.   Eyes: Positive for visual disturbance ("Blurry vision").  Respiratory: Negative.   Cardiovascular: Positive for leg swelling. Negative for chest pain and palpitations.  Endocrine: Negative.   Genitourinary: Negative.   Musculoskeletal: Negative.   Skin: Negative.   Allergic/Immunologic: Negative.   Neurological: Negative.   Hematological: Negative.   Psychiatric/Behavioral: Negative.     Social History   Tobacco Use  . Smoking status: Never Smoker  . Smokeless tobacco: Never Used  Substance Use Topics  . Alcohol use: No    Alcohol/week: 0.0 standard drinks   Objective:   Resp 16   Wt 288 lb 9.6 oz (130.9 kg)   BMI 45.20 kg/m  Vitals:   06/16/18 1618  Resp:  16  Weight: 288 lb 9.6 oz (130.9 kg)     Physical Exam  Constitutional: She appears well-developed and well-nourished. No distress.  Neck: Normal range of motion. Neck supple.  Cardiovascular: Normal rate, regular rhythm and normal heart sounds. Exam reveals no gallop and no friction rub.  No murmur heard. Pulmonary/Chest: Effort normal. No respiratory distress. She has wheezes. She has no rales.  Skin: She is not diaphoretic.  Vitals reviewed.  Diabetic Foot Exam - Simple   Simple Foot Form Diabetic Foot exam was  performed with the following findings:  Yes 06/22/2018  8:43 PM  Visual Inspection No deformities, no ulcerations, no other skin breakdown bilaterally:  Yes Sensation Testing Intact to touch and monofilament testing bilaterally:  Yes Pulse Check Posterior Tibialis and Dorsalis pulse intact bilaterally:  Yes Comments        Assessment & Plan:     1. Chronic pain syndrome Stable. Diagnosis pulled for medication refill. Continue current medical treatment plan. - pregabalin (LYRICA) 200 MG capsule; Take 1 capsule (200 mg total) by mouth 2 (two) times daily.  Dispense: 180 capsule; Refill: 1 - CBC w/Diff/Platelet - Comprehensive Metabolic Panel (CMET) - TSH - Lipid Profile  2. Lymphedema of both lower extremities Stable. Diagnosis pulled for medication refill. Continue current medical treatment plan. - furosemide (LASIX) 20 MG tablet; Take 1 tablet (20 mg total) by mouth daily.  Dispense: 90 tablet; Refill: 1 - potassium chloride (K-DUR) 10 MEQ tablet; Take 1 tablet (10 mEq total) by mouth daily.  Dispense: 90 tablet; Refill: 1 - CBC w/Diff/Platelet - Comprehensive Metabolic Panel (CMET) - TSH - Lipid Profile  3. Essential hypertension Stable. Continue current medical treatment plan. Will check labs as below and f/u pending results. - CBC w/Diff/Platelet - Comprehensive Metabolic Panel (CMET) - TSH - Lipid Profile  4. Bipolar I disorder, current or most recent episode depressed, with psychotic features ([place]) Stable. Will check labs as below and f/u pending results. - CBC w/Diff/Platelet - Comprehensive Metabolic Panel (CMET) - TSH - Lipid Profile  5. Traumatic brain injury with loss of consciousness, subsequent encounter Stable. Will check labs as below and f/u pending results. - CBC w/Diff/Platelet - Comprehensive Metabolic Panel (CMET) - TSH - Lipid Profile  6. Morbid obesity ([place]) BMI 45. 2. Counseled patient on healthy lifestyle modifications including dieting and  exercise.  - CBC w/Diff/Platelet - Comprehensive Metabolic Panel (CMET) - TSH - Lipid Profile  7. Class 3 severe obesity due to excess calories without serious comorbidity with body mass index (BMI) of 45.0 to 49.9 in adult [facility]) Counseled patient on healthy lifestyle modifications including dieting and exercise.  - CBC w/Diff/Platelet - Comprehensive Metabolic Panel (CMET) - TSH - Lipid Profile  8. Bronchitis Worsening despite OTC medications x 1 week. Will treat with zpak and prednisone as below.  - azithromycin (ZITHROMAX) 250 MG tablet; Take 2 tablets PO on day one, and one tablet PO daily thereafter until completed.  Dispense: 6 tablet; Refill: 0 - predniSONE (STERAPRED UNI-PAK 21 TAB) 10 MG (21) TBPK tablet; 6 day taper; take as directed on package instructions  Dispense: 21 tablet; Refill: 0       Mar Daring, PA-C  [facility]

## 2018-06-16 NOTE — Patient Instructions (Signed)
Ibuprofen 200mg  and tylenol 500mg  can be alternated every 6 hours as needed for shoulder pain   Lymphedema Lymphedema is swelling that is caused by the abnormal collection of lymph under the skin. Lymph is fluid from the tissues in your body that travels in the lymphatic system. This system is part of the immune system and includes lymph nodes and lymph vessels. The lymph vessels collect and carry the excess fluid, fats, proteins, and wastes from the tissues of the body to the bloodstream. This system also works to clean and remove bacteria and waste products from the body. Lymphedema occurs when the lymphatic system is blocked. When the lymph vessels or lymph nodes are blocked or damaged, lymph does not drain properly, causing an abnormal buildup of lymph. This leads to swelling in the arms or legs. Lymphedema cannot be cured by medicines, but various methods can be used to help reduce the swelling. What are the causes? There are two types of lymphedema. Primary lymphedema is caused by the absence or abnormality of the lymph vessel at birth. Secondary lymphedema is more common. It occurs when the lymph vessel is damaged or blocked. Common causes of lymph vessel blockage include:  Skin infection, such as cellulitis.  Infection by parasites (filariasis).  Injury.  Cancer.  Radiation therapy.  Formation of scar tissue.  Surgery.  What are the signs or symptoms? Symptoms of this condition include:  Swelling of the arm or leg.  A heavy or tight feeling in the arm or leg.  Swelling of the feet, toes, or fingers. Shoes or rings may fit more tightly than before.  Redness of the skin over the affected area.  Limited movement of the affected limb.  Sensitivity to touch or discomfort in the affected limb.  How is this diagnosed? This condition may be diagnosed with:  A physical exam.  Medical history.  Bioimpedance spectroscopy. In this test, painless electrical currents are used  to measure fluid levels in your body.  Imaging tests, such as: ? Lymphoscintigraphy. In this test, a low dose of a radioactive substance is injected to trace the flow of lymph through the lymph vessels. ? MRI. ? CT scan. ? Duplex ultrasound. This test uses sound waves to produce images of the vessels and the blood flow on a screen. ? Lymphangiography. In this test, a contrast dye is injected into the lymph vessel to help show blockages.  How is this treated? Treatment for this condition may depend on the cause. Treatment may include:  Exercise. Certain exercises can help fluid move out of the affected limb.  Massage. Gentle massage of the affected limb can help move the fluid out of the area.  Compression. Various methods may be used to apply pressure to the affected limb in order to reduce the swelling. ? Wearing compression stockings or sleeves on the affected limb. ? Bandaging the affected limb. ? Using an external pump that is attached to a sleeve that alternates between applying pressure and releasing pressure.  Surgery. This is usually only done for severe cases. For example, surgery may be done if you have trouble moving the limb or if the swelling does not get better with other treatments.  If an underlying condition is causing the lymphedema, treatment for that condition is needed. For example, antibiotic medicines may be used to treat an infection. Follow these instructions at home: Activities  Exercise regularly as directed by your health care provider.  Do not sit with your legs crossed.  When possible,  keep the affected limb raised (elevated) above the level of your heart.  Avoid carrying things with an arm that is affected by lymphedema.  Remember that the affected area is more likely to become injured or infected.  Take these steps to help prevent infection: ? Keep the affected area clean and dry. ? Protect your skin from cuts. For example, you should use gloves  while cooking or gardening. Do not walk barefoot. If you shave the affected area, use an Copy. General instructions  Take medicines only as directed by your health care provider.  Eat a healthy diet that includes a lot of fruits and vegetables.  Do not wear tight clothes, shoes, or jewelry.  Do not use heating pads over the affected area.  Avoid having blood pressure checked on the affected limb.  Keep all follow-up visits as directed by your health care provider. This is important. Contact a health care provider if:  You continue to have swelling in your limb.  You have a fever.  You have a cut that does not heal.  You have redness or pain in the affected area.  You have new swelling in your limb that comes on suddenly.  You develop purplish spots or sores (lesions) on your limb. Get help right away if:  You have a skin rash.  You have chills or sweats.  You have shortness of breath. This information is not intended to replace advice given to you by your health care provider. Make sure you discuss any questions you have with your health care provider. Document Released: 05/13/2007 Document Revised: 03/22/2016 Document Reviewed: 06/23/2014 Elsevier Interactive Patient Education  [employer].

## 2018-06-23 ENCOUNTER — Telehealth: Payer: Self-pay | Admitting: Physician Assistant

## 2018-06-23 DIAGNOSIS — J40 Bronchitis, not specified as acute or chronic: Secondary | ICD-10-CM

## 2018-06-23 MED ORDER — PREDNISONE 10 MG (21) PO TBPK: ORAL_TABLET | ORAL | 0 refills | Status: DC

## 2018-06-23 NOTE — Telephone Encounter (Signed)
Will give one more dose. She will need to switch to taking IBU, aleve, or tylenol once completed for her continued shoulder pain.

## 2018-06-23 NOTE — Telephone Encounter (Signed)
Pt returned missed call.  Please call pt back. ° °Thanks, °T[facility] °

## 2018-06-23 NOTE — Telephone Encounter (Signed)
Patient was advised.  

## 2018-06-23 NOTE — Telephone Encounter (Signed)
Patient would like a refill on the predniSONE (STERAPRED UNI-PAK 21 TAB) 10 MG (21) TBPK tablet. Patient states that the medication helped with her shoulder pain and would like it again. Please advise.

## 2018-06-23 NOTE — Telephone Encounter (Signed)
Pt is needing the medication pack refilled that [place] had prescribed her.  She is now out.  She says it really helped with the pain and swelling in her joints.  Please advise.  Thanks, [employer]

## 2018-06-30 ENCOUNTER — Telehealth: Payer: Self-pay | Admitting: Physician Assistant

## 2018-06-30 DIAGNOSIS — F315 Bipolar disorder, current episode depressed, severe, with psychotic features: Secondary | ICD-10-CM

## 2018-06-30 DIAGNOSIS — G43819 Other migraine, intractable, without status migrainosus: Secondary | ICD-10-CM

## 2018-06-30 DIAGNOSIS — T799XXS Unspecified early complication of trauma, sequela: Secondary | ICD-10-CM

## 2018-06-30 DIAGNOSIS — F422 Mixed obsessional thoughts and acts: Secondary | ICD-10-CM

## 2018-06-30 DIAGNOSIS — F4001 Agoraphobia with panic disorder: Secondary | ICD-10-CM

## 2018-06-30 DIAGNOSIS — F4310 Post-traumatic stress disorder, unspecified: Secondary | ICD-10-CM

## 2018-06-30 DIAGNOSIS — G894 Chronic pain syndrome: Secondary | ICD-10-CM

## 2018-06-30 DIAGNOSIS — S069X9D Unspecified intracranial injury with loss of consciousness of unspecified duration, subsequent encounter: Secondary | ICD-10-CM

## 2018-06-30 NOTE — Telephone Encounter (Signed)
Referral placed.

## 2018-06-30 NOTE — Telephone Encounter (Signed)
Pt requesting to be referred to a pain management clinic.  Please advise.  Thanks, [employer]

## 2018-07-03 ENCOUNTER — Telehealth: Payer: Self-pay | Admitting: Physician Assistant

## 2018-07-03 NOTE — Telephone Encounter (Signed)
Pt having pain in hips and shoulders now.  Asking if Tawanna Sat could refer her to a pain clinic.  Please advise.  Thanks, [employer]

## 2018-07-03 NOTE — Telephone Encounter (Signed)
Ok to refer? Please advise. Thanks!  

## 2018-07-04 NOTE — Telephone Encounter (Signed)
Already done

## 2018-07-08 ENCOUNTER — Telehealth: Payer: Self-pay

## 2018-07-08 DIAGNOSIS — J40 Bronchitis, not specified as acute or chronic: Secondary | ICD-10-CM

## 2018-07-08 NOTE — Telephone Encounter (Signed)
Patient had called the office requesting a prescription for prednisone. Patient states that she has pain and swelling in her hips and joints and has previously been prescribed this in the past. Patient would like prescription sent to [facility] on [address]. Patient has been advised that you are out of the office and will return in the morning. KW

## 2018-07-08 NOTE — Telephone Encounter (Signed)
Please review

## 2018-07-09 ENCOUNTER — Ambulatory Visit (INDEPENDENT_AMBULATORY_CARE_PROVIDER_SITE_OTHER): Payer: Medicare HMO | Admitting: Physician Assistant

## 2018-07-09 ENCOUNTER — Encounter: Payer: Self-pay | Admitting: Physician Assistant

## 2018-07-09 VITALS — BP 125/80 | HR 96 | Temp 97.8°F | Wt 308.8 lb

## 2018-07-09 DIAGNOSIS — M2550 Pain in unspecified joint: Secondary | ICD-10-CM

## 2018-07-09 DIAGNOSIS — S069X1S Unspecified intracranial injury with loss of consciousness of 30 minutes or less, sequela: Secondary | ICD-10-CM | POA: Diagnosis not present

## 2018-07-09 DIAGNOSIS — R609 Edema, unspecified: Secondary | ICD-10-CM

## 2018-07-09 DIAGNOSIS — I890 Lymphedema, not elsewhere classified: Secondary | ICD-10-CM | POA: Diagnosis not present

## 2018-07-09 DIAGNOSIS — R635 Abnormal weight gain: Secondary | ICD-10-CM | POA: Diagnosis not present

## 2018-07-09 MED ORDER — PREDNISONE 10 MG (21) PO TBPK: ORAL_TABLET | ORAL | 0 refills | Status: DC

## 2018-07-09 MED ORDER — FUROSEMIDE 20 MG PO TABS: 20.0000 mg | ORAL_TABLET | Freq: Every day | ORAL | 1 refills | Status: DC

## 2018-07-09 NOTE — Progress Notes (Signed)
Patient: Barbara Anderson Female    DOB: Dec 13, 1970   47 y.o.   MRN: [id number] Visit Date: 07/09/2018  Today's Provider: Mar Daring, PA-C   Chief Complaint  Patient presents with  . Shoulder Pain  . Hip Pain   Subjective:    HPI  Shoulder Pain Patient presents today for bilateral shoulder pain for about 3 weeks. Patient states she has applied heating pad to the area and no improvement. Feels symptoms are worsened due to her fluid retention.    Hip Pain  Patient presents today for bilateral hip pain/stiffness for about 3 weeks.   Patient states she is having swelling over her entire body. Does have a weight gain of almost 20 pounds since last seen on 06/16/18. Having some SOB with exertion with increased weight.   No Known Allergies   Current Outpatient Medications:  .  Alcohol Swabs (B-D SINGLE USE SWABS REGULAR) PADS, USE TO CLEANSE SKIN DAILY BEFORE CHECKING BLOOD SUGAR, Disp: 100 each, Rfl: 1 .  azithromycin (ZITHROMAX) 250 MG tablet, Take 2 tablets PO on day one, and one tablet PO daily thereafter until completed., Disp: 6 tablet, Rfl: 0 .  clotrimazole (LOTRIMIN) 1 % cream, Apply 1 application topically 2 (two) times daily., Disp: 30 g, Rfl: 0 .  DULoxetine (CYMBALTA) 60 MG capsule, Take 2 capsules (120 mg total) by mouth daily., Disp: 60 capsule, Rfl: 0 .  folic acid (FOLVITE) 1 MG tablet, Take 1 tablet (1 mg total) daily by mouth., Disp: 90 tablet, Rfl: 1 .  furosemide (LASIX) 20 MG tablet, Take 1 tablet (20 mg total) by mouth daily., Disp: 90 tablet, Rfl: 1 .  hydrOXYzine (ATARAX/VISTARIL) 50 MG tablet, Take 1 tablet (50 mg total) by mouth 3 (three) times daily as needed for anxiety., Disp: 90 tablet, Rfl: 1 .  potassium chloride (K-DUR) 10 MEQ tablet, Take 1 tablet (10 mEq total) by mouth daily., Disp: 90 tablet, Rfl: 1 .  predniSONE (STERAPRED UNI-PAK 21 TAB) 10 MG (21) TBPK tablet, 6 day taper; take as directed on package instructions, Disp: 21 tablet,  Rfl: 0 .  pregabalin (LYRICA) 200 MG capsule, Take 1 capsule (200 mg total) by mouth 2 (two) times daily., Disp: 180 capsule, Rfl: 1 .  risperiDONE (RISPERDAL) 1 MG tablet, Take 1 tablet (1 mg total) by mouth at bedtime., Disp: 30 tablet, Rfl: 1 .  topiramate (TOPAMAX) 200 MG tablet, Take 1 tablet (200 mg total) by mouth at bedtime., Disp: 30 tablet, Rfl: 1 .  traZODone (DESYREL) 150 MG tablet, Take 1 tablet (150 mg total) by mouth at bedtime., Disp: 30 tablet, Rfl: 1 .  Vitamin D, Ergocalciferol, (DRISDOL) 1.25 MG (50000 UT) CAPS capsule, TAKE 1 CAPSULE EVERY 7 DAYS, Disp: 12 capsule, Rfl: 0  Review of Systems  Constitutional: Negative.   HENT: Negative.   Respiratory: Positive for shortness of breath.   Cardiovascular: Positive for leg swelling.  Gastrointestinal: Negative.   Musculoskeletal: Positive for arthralgias, gait problem, joint swelling and myalgias.    Social History   Tobacco Use  . Smoking status: Never Smoker  . Smokeless tobacco: Never Used  Substance Use Topics  . Alcohol use: No    Alcohol/week: 0.0 standard drinks   Objective:   BP 125/80 (BP Location: Left Arm, Patient Position: Sitting, Cuff Size: Large)   Pulse 96   Temp 97.8 F (36.6 C) (Oral)   Wt (!) 308 lb 12.8 oz (140.1 kg)   LMP 06/18/2018  SpO2 99%   BMI 48.36 kg/m  Vitals:   07/09/18 1702  BP: 125/80  Pulse: 96  Temp: 97.8 F (36.6 C)  TempSrc: Oral  SpO2: 99%  Weight: (!) 308 lb 12.8 oz (140.1 kg)     Physical Exam  Constitutional: She appears well-developed and well-nourished. No distress.  Neck: Normal range of motion. Neck supple. No JVD present. No tracheal deviation present. No thyromegaly present.  Cardiovascular: Normal rate, regular rhythm and normal heart sounds. Exam reveals no gallop and no friction rub.  No murmur heard. Pulmonary/Chest: Effort normal and breath sounds normal. No respiratory distress. She has no wheezes. She has no rales.  Musculoskeletal: Normal range  of motion. She exhibits edema.  Lymphadenopathy:    She has no cervical adenopathy.  Skin: She is not diaphoretic.  Vitals reviewed.       Assessment & Plan:     1. Edema, unspecified type Increased weight gain of 20 pounds since last seen. Will check labs as below and f/u pending results. Increase furosemide to 40mg  x 1 week then decrease back to 20mg . Call if not effective or symptoms worsen.  - CBC w/Diff/Platelet - B Nat Peptide - Basic Metabolic Panel (BMET) - ANA,IFA RA Diag Pnl w/rflx Tit/Patn  2. Weight gain 20 pounds since 06/16/18.  - CBC w/Diff/Platelet - B Nat Peptide - Basic Metabolic Panel (BMET) - ANA,IFA RA Diag Pnl w/rflx Tit/Patn  3. Arthralgia, unspecified joint Both hips and shoulders. Will check lab as below for autoimmune source. I will f/u pending results. If labs normal and fluid coming off but pain persists patient will need imaging.  - ANA,IFA RA Diag Pnl w/rflx Tit/Patn  4. Lymphedema of both lower extremities See above medical treatment plan for #1.  - furosemide (LASIX) 20 MG tablet; Take 1-2 tablets (20-40 mg total) by mouth daily.  Dispense: 90 tablet; Refill: 1  5. Traumatic brain injury, with loss of consciousness of 30 minutes or less, sequela ([place]) May be exacerbating symptoms. Will work up but possible chronic issues from the TBI.   The entirety of the information documented in the History of Present Illness, Review of Systems and Physical Exam were personally obtained by me. Portions of this information were initially documented by Lendon Ka, CMA and reviewed by me for thoroughness and accuracy.      Mar Daring, PA-C  [facility]

## 2018-07-09 NOTE — Telephone Encounter (Signed)
Sent in

## 2018-07-10 DIAGNOSIS — R601 Generalized edema: Secondary | ICD-10-CM | POA: Diagnosis not present

## 2018-07-10 DIAGNOSIS — R609 Edema, unspecified: Secondary | ICD-10-CM | POA: Diagnosis not present

## 2018-07-10 DIAGNOSIS — R635 Abnormal weight gain: Secondary | ICD-10-CM | POA: Diagnosis not present

## 2018-07-12 LAB — BASIC METABOLIC PANEL
BUN/Creatinine Ratio: 10 (ref 9–23)
BUN: 10 mg/dL (ref 6–24)
CO2: 23 mmol/L (ref 20–29)
Calcium: 9.2 mg/dL (ref 8.7–10.2)
Chloride: 97 mmol/L (ref 96–106)
Creatinine, Ser: 0.97 mg/dL (ref 0.57–1.00)
GFR calc Af Amer: 80 mL/min/{1.73_m2} (ref >59)
GFR calc non Af Amer: 69 mL/min/{1.73_m2} (ref >59)
Glucose: 211 mg/dL — ABNORMAL HIGH (ref 65–99)
Potassium: 4.1 mmol/L (ref 3.5–5.2)
Sodium: 139 mmol/L (ref 134–144)

## 2018-07-12 LAB — CBC WITH DIFFERENTIAL/PLATELET
BASOS: 0 %
Basophils Absolute: 0 10*3/uL (ref 0.0–0.2)
EOS (ABSOLUTE): 0.3 10*3/uL (ref 0.0–0.4)
Eos: 3 %
Hematocrit: 36.9 % (ref 34.0–46.6)
Hemoglobin: 12.3 g/dL (ref 11.1–15.9)
Immature Grans (Abs): 0.1 10*3/uL (ref 0.0–0.1)
Immature Granulocytes: 1 %
Lymphocytes Absolute: 1.4 10*3/uL (ref 0.7–3.1)
Lymphs: 14 %
MCH: 28.3 pg (ref 26.6–33.0)
MCHC: 33.3 g/dL (ref 31.5–35.7)
MCV: 85 fL (ref 79–97)
Monocytes Absolute: 0.5 10*3/uL (ref 0.1–0.9)
Monocytes: 5 %
Neutrophils Absolute: 7.3 10*3/uL — ABNORMAL HIGH (ref 1.4–7.0)
Neutrophils: 77 %
Platelets: 199 10*3/uL (ref 150–450)
RBC: 4.34 x10E6/uL (ref 3.77–5.28)
RDW: 15.5 % — ABNORMAL HIGH (ref 12.3–15.4)
WBC: 9.5 10*3/uL (ref 3.4–10.8)

## 2018-07-12 LAB — ANA,IFA RA DIAG PNL W/RFLX TIT/PATN
ANA Titer 1: NEGATIVE
Cyclic Citrullin Peptide Ab: 8 units (ref 0–19)
RHEUMATOID FACTOR: 11.3 [IU]/mL (ref 0.0–13.9)

## 2018-07-12 LAB — BRAIN NATRIURETIC PEPTIDE: BNP: 7.9 pg/mL (ref 0.0–100.0)

## 2018-07-14 ENCOUNTER — Telehealth: Payer: Self-pay

## 2018-07-14 NOTE — Telephone Encounter (Signed)
Called [facility]

## 2018-07-14 NOTE — Telephone Encounter (Signed)
-----   Message from Mar Daring, [place] sent at 07/14/2018  7:54 AM EST ----- Can we see if they will add on an A1c for elevated glucose. Thanks. JB

## 2018-07-15 ENCOUNTER — Telehealth: Payer: Self-pay

## 2018-07-15 LAB — HGB A1C W/O EAG: Hgb A1c MFr Bld: 8.4 % — ABNORMAL HIGH (ref 4.8–5.6)

## 2018-07-15 LAB — SPECIMEN STATUS REPORT

## 2018-07-15 NOTE — Telephone Encounter (Signed)
Pt calling back to relay some more info regarding labs.  Please call pt back.  Thanks, [employer]

## 2018-07-15 NOTE — Telephone Encounter (Signed)
Patient advised as below.  

## 2018-07-15 NOTE — Telephone Encounter (Signed)
-----   Message from Mar Daring, [place] sent at 07/15/2018  7:49 AM EST ----- Blood count is normal. BNP is normal (no heart failure). Kidney function is normal. ANA and RF are negative. A1c, however, is now up to 8.4 from 6.1. This indicates diabetes. Please schedule patient in a 40 minute time slot to discuss new onset diabetes. Thanks.

## 2018-07-16 NOTE — Telephone Encounter (Signed)
Patient wanted to go over A1c.

## 2018-07-21 ENCOUNTER — Encounter: Payer: Self-pay | Admitting: Physician Assistant

## 2018-07-21 ENCOUNTER — Ambulatory Visit (INDEPENDENT_AMBULATORY_CARE_PROVIDER_SITE_OTHER): Payer: Medicare HMO | Admitting: Physician Assistant

## 2018-07-21 VITALS — BP 187/97 | HR 98 | Temp 98.2°F | Resp 16 | Wt 301.0 lb

## 2018-07-21 DIAGNOSIS — E1129 Type 2 diabetes mellitus with other diabetic kidney complication: Secondary | ICD-10-CM

## 2018-07-21 DIAGNOSIS — E119 Type 2 diabetes mellitus without complications: Secondary | ICD-10-CM

## 2018-07-21 DIAGNOSIS — R809 Proteinuria, unspecified: Secondary | ICD-10-CM

## 2018-07-21 DIAGNOSIS — Z23 Encounter for immunization: Secondary | ICD-10-CM

## 2018-07-21 LAB — POCT UA - MICROALBUMIN: Microalbumin Ur, POC: 50 mg/L

## 2018-07-21 MED ORDER — LISINOPRIL 10 MG PO TABS: 10.0000 mg | ORAL_TABLET | Freq: Every day | ORAL | 3 refills | Status: DC

## 2018-07-21 MED ORDER — ACCU-CHEK AVIVA PLUS W/DEVICE KIT: PACK | 0 refills | Status: DC

## 2018-07-21 MED ORDER — GLUCOSE BLOOD VI STRP: ORAL_STRIP | 12 refills | Status: DC

## 2018-07-21 MED ORDER — METFORMIN HCL 500 MG PO TABS: 500.0000 mg | ORAL_TABLET | Freq: Two times a day (BID) | ORAL | 0 refills | Status: DC

## 2018-07-21 MED ORDER — ACCU-CHEK FASTCLIX LANCET KIT: PACK | 0 refills | Status: AC

## 2018-07-21 MED ORDER — ATORVASTATIN CALCIUM 10 MG PO TABS: 10.0000 mg | ORAL_TABLET | Freq: Every day | ORAL | 3 refills | Status: DC

## 2018-07-21 MED ORDER — ACCU-CHEK FASTCLIX LANCETS MISC: 4 refills | Status: DC

## 2018-07-21 NOTE — Progress Notes (Signed)
Patient: Barbara Anderson Female    DOB: 03-Oct-1970   47 y.o.   MRN: [id number] Visit Date: 07/21/2018  Today's Provider: Mar Daring, PA-C   Chief Complaint  Patient presents with  . Diabetes    New onset   Subjective:     HPI Patient here today to discuss treatment option for new onset diabetes. Was having some symptoms of fatigue and swelling. Labs were checked and reveled an elevated glucose level of 221. A1c was added on and showed an A1c of 8.4, upr from 6.1 last year. Patient does report family history of T2DM in her mother(had amputations) and father (was on dialysis and affected vision).    Patient is also requesting referral to Pain Management due to fibromyalgia. Patient reports that she used to see Dr. Primus Bravo he has moved to a new practice and they do not take patient's insurance. Referral was placed on 06/30/18 and is still pending.    No Known Allergies   Current Outpatient Medications:  .  Alcohol Swabs (B-D SINGLE USE SWABS REGULAR) PADS, USE TO CLEANSE SKIN DAILY BEFORE CHECKING BLOOD SUGAR, Disp: 100 each, Rfl: 1 .  clotrimazole (LOTRIMIN) 1 % cream, Apply 1 application topically 2 (two) times daily., Disp: 30 g, Rfl: 0 .  DULoxetine (CYMBALTA) 60 MG capsule, Take 2 capsules (120 mg total) by mouth daily., Disp: 60 capsule, Rfl: 0 .  folic acid (FOLVITE) 1 MG tablet, Take 1 tablet (1 mg total) daily by mouth., Disp: 90 tablet, Rfl: 1 .  furosemide (LASIX) 20 MG tablet, Take 1-2 tablets (20-40 mg total) by mouth daily., Disp: 90 tablet, Rfl: 1 .  hydrOXYzine (ATARAX/VISTARIL) 50 MG tablet, Take 1 tablet (50 mg total) by mouth 3 (three) times daily as needed for anxiety., Disp: 90 tablet, Rfl: 1 .  potassium chloride (K-DUR) 10 MEQ tablet, Take 1 tablet (10 mEq total) by mouth daily., Disp: 90 tablet, Rfl: 1 .  predniSONE (STERAPRED UNI-PAK 21 TAB) 10 MG (21) TBPK tablet, 6 day taper; take as directed on package instructions, Disp: 21 tablet, Rfl: 0 .   pregabalin (LYRICA) 200 MG capsule, Take 1 capsule (200 mg total) by mouth 2 (two) times daily., Disp: 180 capsule, Rfl: 1 .  risperiDONE (RISPERDAL) 1 MG tablet, Take 1 tablet (1 mg total) by mouth at bedtime., Disp: 30 tablet, Rfl: 1 .  topiramate (TOPAMAX) 200 MG tablet, Take 1 tablet (200 mg total) by mouth at bedtime., Disp: 30 tablet, Rfl: 1 .  traZODone (DESYREL) 150 MG tablet, Take 1 tablet (150 mg total) by mouth at bedtime., Disp: 30 tablet, Rfl: 1 .  Vitamin D, Ergocalciferol, (DRISDOL) 1.25 MG (50000 UT) CAPS capsule, TAKE 1 CAPSULE EVERY 7 DAYS, Disp: 12 capsule, Rfl: 0  Review of Systems  Constitutional: Negative.   Musculoskeletal: Positive for arthralgias and myalgias.    Social History   Tobacco Use  . Smoking status: Never Smoker  . Smokeless tobacco: Never Used  Substance Use Topics  . Alcohol use: No    Alcohol/week: 0.0 standard drinks      Objective:   BP (!) 187/97 (BP Location: Left Arm, Patient Position: Sitting, Cuff Size: Large)   Pulse 98   Temp 98.2 F (36.8 C) (Oral)   Resp 16   Wt (!) 301 lb (136.5 kg)   BMI 47.14 kg/m  Vitals:   07/21/18 1558  BP: (!) 187/97  Pulse: 98  Resp: 16  Temp: 98.2 F (36.8 C)  TempSrc: Oral  Weight: (!) 301 lb (136.5 kg)     Physical Exam Vitals signs reviewed.  Constitutional:      General: She is not in acute distress.    Appearance: She is well-developed. She is not diaphoretic.  Neck:     Musculoskeletal: Normal range of motion and neck supple.  Cardiovascular:     Rate and Rhythm: Normal rate and regular rhythm.     Heart sounds: Normal heart sounds. No murmur. No friction rub. No gallop.   Pulmonary:     Effort: Pulmonary effort is normal. No respiratory distress.     Breath sounds: Normal breath sounds. No wheezing or rales.     Diabetic Foot Exam - Simple   Simple Foot Form Diabetic Foot exam was performed with the following findings:  Yes 07/21/2018  4:36 PM  Visual Inspection No  deformities, no ulcerations, no other skin breakdown bilaterally:  Yes Sensation Testing Intact to touch and monofilament testing bilaterally:  Yes Pulse Check Posterior Tibialis and Dorsalis pulse intact bilaterally:  Yes Comments       Assessment & Plan    1. New onset type 2 diabetes mellitus ([place]) Microalbumin today was 50. Will start metformin 567m BID, lisinopril 160mfor microalbuminuria and atorvastatin 1036mPneumococcal 23 vaccine given today without complications. Advised patient to have diabetic eye exam annually. She voiced understanding. Declines diabetic education class at this time. I will see her back in 3 months to see how her A1c is doing with metformin and lifestyle modifications.  - POCT UA - Microalbumin - Pneumococcal polysaccharide vaccine 23-valent greater than or equal to 2yo subcutaneous/IM - metFORMIN (GLUCOPHAGE) 500 MG tablet; Take 1 tablet (500 mg total) by mouth 2 (two) times daily with a meal.  Dispense: 180 tablet; Refill: 0 - Blood Glucose Monitoring Suppl (ACCU-CHEK AVIVA PLUS) w/Device KIT; To check blood sugar once daily  Dispense: 1 kit; Refill: 0 - glucose blood (ACCU-CHEK AVIVA PLUS) test strip; To check blood sugar once daily  Dispense: 100 each; Refill: 12 - ACCU-CHEK FASTCLIX LANCETS MISC; To check blood sugar once daily  Dispense: 100 each; Refill: 4 - Lancets Misc. (ACCU-CHEK FASTCLIX LANCET) KIT; To check blood sugar once daily  Dispense: 1 kit; Refill: 0 - atorvastatin (LIPITOR) 10 MG tablet; Take 1 tablet (10 mg total) by mouth daily.  Dispense: 90 tablet; Refill: 3 - lisinopril (PRINIVIL,ZESTRIL) 10 MG tablet; Take 1 tablet (10 mg total) by mouth daily.  Dispense: 90 tablet; Refill: 3  2. Type 2 diabetes mellitus with microalbuminuria, without long-term current use of insulin ([facility] medical treatment plan. - lisinopril (PRINIVIL,ZESTRIL) 10 MG tablet; Take 1 tablet (10 mg total) by mouth daily.  Dispense: 90 tablet; Refill: 3  3.  Need for pneumococcal vaccination Vaccine given to patient without complications. Patient sat for 15 minutes after administration and was tolerated well without adverse effects. - Pneumococcal polysaccharide vaccine 23-valent greater than or equal to 2yo subcutaneous/IM     JenMar DaringA-C  [place]

## 2018-07-21 NOTE — Patient Instructions (Signed)
Type 2 Diabetes Mellitus, Diagnosis, Adult Type 2 diabetes (type 2 diabetes mellitus) is a long-term (chronic) disease. It may be caused by one or both of these problems:  Your pancreas does not make enough of a hormone called insulin.  Your body does not react in a normal way to insulin that it makes. Insulin lets sugars (glucose) go into cells in your body. This gives you energy. If you have type 2 diabetes, sugars cannot get into cells. This causes high blood sugar (hyperglycemia). Your doctor will set treatment goals for you. Generally, you should have these blood sugar levels:  Before meals (preprandial): 80-130 mg/dL (4.4-7.2 mmol/L).  After meals (postprandial): below 180 mg/dL (10 mmol/L).  A1c (hemoglobin A1c) level: less than 7%. Follow these instructions at home: Questions to ask your doctor  You may want to ask these questions: ? Do I need to meet with a diabetes educator? ? Where can I find a support group for people with diabetes? ? What equipment will I need to care for myself at home? ? What diabetes medicines do I need? When should I take them? ? How often do I need to check my blood sugar? ? What number can I call if I have questions? ? When is my next doctor's visit? General instructions  Take over-the-counter and prescription medicines only as told by your doctor.  Keep all follow-up visits as told by your doctor. This is important. Contact a doctor if:  Your blood sugar is at or above 240 mg/dL (13.3 mmol/L) for 2 days in a row.  You have been sick for 2 days or more, and you are not getting better.  You have had a fever for 2 days or more, and you are not getting better.  You have any of these problems for more than 6 hours: ? You cannot eat or drink. ? You feel sick to your stomach (nauseous). ? You throw up (vomit). ? You have watery poop (diarrhea). Get help right away if:  Your blood sugar is lower than 54 mg/dL (3 mmol/L).  You get  confused.  You have trouble: ? Thinking clearly. ? Breathing.  You have moderate or large ketone levels in your pee (urine). Summary  Type 2 diabetes is a long-term (chronic) disease. Your pancreas may not make enough of a hormone called insulin, or your body may not react normally to insulin that it makes.  Take over-the-counter and prescription medicines only as told by your doctor.  Keep all follow-up visits as told by your doctor. This is important. This information is not intended to replace advice given to you by your health care provider. Make sure you discuss any questions you have with your health care provider. Document Released: 04/24/2008 Document Revised: 02/14/2017 Document Reviewed: 08/19/2015 Elsevier Interactive Patient Education  2019 [employer]. Carbohydrate Counting for Diabetes Mellitus, Adult  Carbohydrate counting is a method of keeping track of how many carbohydrates you eat. Eating carbohydrates naturally increases the amount of sugar (glucose) in the blood. Counting how many carbohydrates you eat helps keep your blood glucose within normal limits, which helps you manage your diabetes (diabetes mellitus). It is important to know how many carbohydrates you can safely have in each meal. This is different for every person. A diet and nutrition specialist (registered dietitian) can help you make a meal plan and calculate how many carbohydrates you should have at each meal and snack. Carbohydrates are found in the following foods:  Grains, such as breads  and cereals.  Dried beans and soy products.  Starchy vegetables, such as potatoes, peas, and corn.  Fruit and fruit juices.  Milk and yogurt.  Sweets and snack foods, such as cake, cookies, candy, chips, and soft drinks. How do I count carbohydrates? There are two ways to count carbohydrates in food. You can use either of the methods or a combination of both. Reading "Nutrition Facts" on packaged food The  "Nutrition Facts" list is included on the labels of almost all packaged foods and beverages in the [place] It includes:  The serving size.  Information about nutrients in each serving, including the grams (g) of carbohydrate per serving. To use the "Nutrition Facts":  Decide how many servings you will have.  Multiply the number of servings by the number of carbohydrates per serving.  The resulting number is the total amount of carbohydrates that you will be having. Learning standard serving sizes of other foods When you eat carbohydrate foods that are not packaged or do not include "Nutrition Facts" on the label, you need to measure the servings in order to count the amount of carbohydrates:  Measure the foods that you will eat with a food scale or measuring cup, if needed.  Decide how many standard-size servings you will eat.  Multiply the number of servings by 15. Most carbohydrate-rich foods have about 15 g of carbohydrates per serving. ? For example, if you eat 8 oz (170 g) of strawberries, you will have eaten 2 servings and 30 g of carbohydrates (2 servings x 15 g = 30 g).  For foods that have more than one food mixed, such as soups and casseroles, you must count the carbohydrates in each food that is included. The following list contains standard serving sizes of common carbohydrate-rich foods. Each of these servings has about 15 g of carbohydrates:   hamburger bun or  English muffin.   oz (15 mL) syrup.   oz (14 g) jelly.  1 slice of bread.  1 six-inch tortilla.  3 oz (85 g) cooked rice or pasta.  4 oz (113 g) cooked dried beans.  4 oz (113 g) starchy vegetable, such as peas, corn, or potatoes.  4 oz (113 g) hot cereal.  4 oz (113 g) mashed potatoes or  of a large baked potato.  4 oz (113 g) canned or frozen fruit.  4 oz (120 mL) fruit juice.  4-6 crackers.  6 chicken nuggets.  6 oz (170 g) unsweetened dry cereal.  6 oz (170 g) plain fat-free yogurt or  yogurt sweetened with artificial sweeteners.  8 oz (240 mL) milk.  8 oz (170 g) fresh fruit or one small piece of fruit.  24 oz (680 g) popped popcorn. Example of carbohydrate counting Sample meal  3 oz (85 g) chicken breast.  6 oz (170 g) brown rice.  4 oz (113 g) corn.  8 oz (240 mL) milk.  8 oz (170 g) strawberries with sugar-free whipped topping. Carbohydrate calculation 1. Identify the foods that contain carbohydrates: ? Rice. ? Corn. ? Milk. ? Strawberries. 2. Calculate how many servings you have of each food: ? 2 servings rice. ? 1 serving corn. ? 1 serving milk. ? 1 serving strawberries. 3. Multiply each number of servings by 15 g: ? 2 servings rice x 15 g = 30 g. ? 1 serving corn x 15 g = 15 g. ? 1 serving milk x 15 g = 15 g. ? 1 serving strawberries x 15 g =  15 g. 4. Add together all of the amounts to find the total grams of carbohydrates eaten: ? 30 g + 15 g + 15 g + 15 g = 75 g of carbohydrates total. Summary  Carbohydrate counting is a method of keeping track of how many carbohydrates you eat.  Eating carbohydrates naturally increases the amount of sugar (glucose) in the blood.  Counting how many carbohydrates you eat helps keep your blood glucose within normal limits, which helps you manage your diabetes.  A diet and nutrition specialist (registered dietitian) can help you make a meal plan and calculate how many carbohydrates you should have at each meal and snack. This information is not intended to replace advice given to you by your health care provider. Make sure you discuss any questions you have with your health care provider. Document Released: 07/16/2005 Document Revised: 01/23/2017 Document Reviewed: 12/28/2015 Elsevier Interactive Patient Education  2019 [employer].

## 2018-07-24 ENCOUNTER — Other Ambulatory Visit: Payer: Self-pay | Admitting: Physician Assistant

## 2018-07-24 DIAGNOSIS — R809 Proteinuria, unspecified: Principal | ICD-10-CM

## 2018-07-24 DIAGNOSIS — E1129 Type 2 diabetes mellitus with other diabetic kidney complication: Secondary | ICD-10-CM

## 2018-07-24 MED ORDER — ACCU-CHEK SOFTCLIX LANCETS MISC: 12 refills | Status: DC

## 2018-07-24 NOTE — Progress Notes (Signed)
Updated diabetic supplies

## 2018-07-31 ENCOUNTER — Telehealth: Payer: Self-pay | Admitting: Physician Assistant

## 2018-07-31 NOTE — Telephone Encounter (Signed)
°  Pt needing refill on:  predniSONE (STERAPRED UNI-PAK 21 TAB) 10 MG (21) TBPK tablet   Please fill at:  [facility] [address] [place] [phone] (Phone) [phone] (Fax)   Thanks, [facility]

## 2018-07-31 NOTE — Telephone Encounter (Signed)
For pain on hip and shoulders. Patient reports that prednisone helped with pain last time she took it. Patient reports that she is waiting for pain clinic to call her and schedule an appt.

## 2018-08-01 NOTE — Telephone Encounter (Signed)
Patient will need to be seen for re-evaluation. Tawanna Sat gave this medication on 07/09/2018 and she mentioned need for possible imaging if pain persists. She has new onset uncontrolled diabetes as well as significant multiple other issues that would be negatively affected by repeat steroids. Also, pain referral has been updated in the chart and apparently she must sign a release of information in order to proceed with referral if she is not aware of this, there was a message left on 07/08/2018.

## 2018-08-01 NOTE — Telephone Encounter (Signed)
Patient was advised appointment was scheduled for 08/05/18. KW

## 2018-08-05 ENCOUNTER — Encounter: Payer: Self-pay | Admitting: Physician Assistant

## 2018-08-05 ENCOUNTER — Ambulatory Visit (INDEPENDENT_AMBULATORY_CARE_PROVIDER_SITE_OTHER): Payer: Medicare HMO | Admitting: Physician Assistant

## 2018-08-05 VITALS — BP 128/70 | HR 120 | Temp 98.1°F | Resp 16 | Wt 302.0 lb

## 2018-08-05 DIAGNOSIS — R601 Generalized edema: Secondary | ICD-10-CM

## 2018-08-05 DIAGNOSIS — M25511 Pain in right shoulder: Secondary | ICD-10-CM

## 2018-08-05 DIAGNOSIS — M25512 Pain in left shoulder: Secondary | ICD-10-CM | POA: Diagnosis not present

## 2018-08-05 DIAGNOSIS — M25551 Pain in right hip: Secondary | ICD-10-CM | POA: Diagnosis not present

## 2018-08-05 DIAGNOSIS — M25552 Pain in left hip: Secondary | ICD-10-CM | POA: Diagnosis not present

## 2018-08-05 MED ORDER — POTASSIUM CHLORIDE ER 20 MEQ PO TBCR: 1.0000 | EXTENDED_RELEASE_TABLET | Freq: Every day | ORAL | 1 refills | Status: DC

## 2018-08-05 MED ORDER — MELOXICAM 15 MG PO TABS: 15.0000 mg | ORAL_TABLET | Freq: Every day | ORAL | 0 refills | Status: DC

## 2018-08-05 MED ORDER — FUROSEMIDE 40 MG PO TABS: 40.0000 mg | ORAL_TABLET | Freq: Every day | ORAL | 3 refills | Status: DC

## 2018-08-05 NOTE — Progress Notes (Signed)
Patient: Barbara Anderson Female    DOB: 06-26-1971   48 y.o.   MRN: [id number] Visit Date: 08/05/2018  Today's Provider: Mar Daring, PA-C   Chief Complaint  Patient presents with  . Follow-up   Subjective:     HPI   Arthralgia, unspecified joint From 07/09/2018-labs checked, normal. Both hips and shoulders. Will check lab as below for autoimmune source. I will f/u pending results. If labs normal and fluid coming off but pain persists patient will need imaging.    Pain continues. Reports symptoms improve while she is on a steroid but worsen once discontinuing. Was recently diagnosed with T2DM so can no longer take multiple steroids.    No Known Allergies   Current Outpatient Medications:  .  ACCU-CHEK SOFTCLIX LANCETS lancets, Use as instructed, Disp: 100 each, Rfl: 12 .  Alcohol Swabs (B-D SINGLE USE SWABS REGULAR) PADS, USE TO CLEANSE SKIN DAILY BEFORE CHECKING BLOOD SUGAR, Disp: 100 each, Rfl: 1 .  atorvastatin (LIPITOR) 10 MG tablet, Take 1 tablet (10 mg total) by mouth daily., Disp: 90 tablet, Rfl: 3 .  Blood Glucose Monitoring Suppl (ACCU-CHEK AVIVA PLUS) w/Device KIT, To check blood sugar once daily, Disp: 1 kit, Rfl: 0 .  DULoxetine (CYMBALTA) 60 MG capsule, Take 2 capsules (120 mg total) by mouth daily., Disp: 60 capsule, Rfl: 0 .  folic acid (FOLVITE) 1 MG tablet, Take 1 tablet (1 mg total) daily by mouth., Disp: 90 tablet, Rfl: 1 .  furosemide (LASIX) 20 MG tablet, Take 1-2 tablets (20-40 mg total) by mouth daily., Disp: 90 tablet, Rfl: 1 .  glucose blood (ACCU-CHEK AVIVA PLUS) test strip, To check blood sugar once daily, Disp: 100 each, Rfl: 12 .  hydrOXYzine (ATARAX/VISTARIL) 50 MG tablet, Take 1 tablet (50 mg total) by mouth 3 (three) times daily as needed for anxiety., Disp: 90 tablet, Rfl: 1 .  Lancets Misc. (ACCU-CHEK FASTCLIX LANCET) KIT, To check blood sugar once daily, Disp: 1 kit, Rfl: 0 .  lisinopril (PRINIVIL,ZESTRIL) 10 MG tablet, Take 1 tablet  (10 mg total) by mouth daily., Disp: 90 tablet, Rfl: 3 .  metFORMIN (GLUCOPHAGE) 500 MG tablet, Take 1 tablet (500 mg total) by mouth 2 (two) times daily with a meal., Disp: 180 tablet, Rfl: 0 .  potassium chloride (K-DUR) 10 MEQ tablet, Take 1 tablet (10 mEq total) by mouth daily., Disp: 90 tablet, Rfl: 1 .  pregabalin (LYRICA) 200 MG capsule, Take 1 capsule (200 mg total) by mouth 2 (two) times daily., Disp: 180 capsule, Rfl: 1 .  risperiDONE (RISPERDAL) 1 MG tablet, Take 1 tablet (1 mg total) by mouth at bedtime., Disp: 30 tablet, Rfl: 1 .  topiramate (TOPAMAX) 200 MG tablet, Take 1 tablet (200 mg total) by mouth at bedtime., Disp: 30 tablet, Rfl: 1 .  traZODone (DESYREL) 150 MG tablet, Take 1 tablet (150 mg total) by mouth at bedtime., Disp: 30 tablet, Rfl: 1 .  Vitamin D, Ergocalciferol, (DRISDOL) 1.25 MG (50000 UT) CAPS capsule, TAKE 1 CAPSULE EVERY 7 DAYS, Disp: 12 capsule, Rfl: 0 .  clotrimazole (LOTRIMIN) 1 % cream, Apply 1 application topically 2 (two) times daily. (Patient not taking: Reported on 08/05/2018), Disp: 30 g, Rfl: 0 .  predniSONE (STERAPRED UNI-PAK 21 TAB) 10 MG (21) TBPK tablet, 6 day taper; take as directed on package instructions (Patient not taking: Reported on 08/05/2018), Disp: 21 tablet, Rfl: 0  Review of Systems  Constitutional: Negative for appetite change, chills, fatigue and  fever.  Respiratory: Negative for chest tightness and shortness of breath.   Cardiovascular: Negative for chest pain and palpitations.  Gastrointestinal: Negative for abdominal pain, nausea and vomiting.  Neurological: Negative for dizziness and weakness.    Social History   Tobacco Use  . Smoking status: Never Smoker  . Smokeless tobacco: Never Used  Substance Use Topics  . Alcohol use: No    Alcohol/week: 0.0 standard drinks      Objective:   BP 128/70 (BP Location: Right Arm, Patient Position: Sitting, Cuff Size: Large)   Pulse (!) 120   Temp 98.1 F (36.7 C) (Oral)   Resp 16    Wt (!) 302 lb (137 kg)   SpO2 99%   BMI 47.30 kg/m  Vitals:   08/05/18 1712  BP: 128/70  Pulse: (!) 120  Resp: 16  Temp: 98.1 F (36.7 C)  TempSrc: Oral  SpO2: 99%  Weight: (!) 302 lb (137 kg)     Physical Exam Vitals signs reviewed.  Constitutional:      Appearance: She is well-developed.  HENT:     Head: Normocephalic and atraumatic.  Neck:     Musculoskeletal: Normal range of motion and neck supple.  Pulmonary:     Effort: Pulmonary effort is normal. No respiratory distress.  Neurological:     Gait: Gait abnormal (antalgic).  Psychiatric:        Behavior: Behavior normal.        Thought Content: Thought content normal.        Judgment: Judgment normal.        Assessment & Plan    1. Pain of both shoulder joints Will try meloxicam as below. Patient declines imaging at this time, doesn't have time or money for that. She is to call if symptoms worsen or fail to improve.  - meloxicam (MOBIC) 15 MG tablet; Take 1 tablet (15 mg total) by mouth daily.  Dispense: 30 tablet; Refill: 0  2. Acute pain of both hips See above medical treatment plan. - meloxicam (MOBIC) 15 MG tablet; Take 1 tablet (15 mg total) by mouth daily.  Dispense: 30 tablet; Refill: 0  3. Generalized edema Stable. Diagnosis pulled for medication refill. Continue current medical treatment plan. - furosemide (LASIX) 40 MG tablet; Take 1 tablet (40 mg total) by mouth daily.  Dispense: 30 tablet; Refill: 3 - Potassium Chloride ER 20 MEQ TBCR; Take 1 tablet by mouth daily.  Dispense: 90 tablet; Refill: [place], PA-C  [facility]

## 2018-08-05 NOTE — Patient Instructions (Signed)
Meloxicam tablets What is this medicine? MELOXICAM (mel OX i cam) is a non-steroidal anti-inflammatory drug (NSAID). It is used to reduce swelling and to treat pain. It may be used for osteoarthritis, rheumatoid arthritis, or juvenile rheumatoid arthritis. This medicine may be used for other purposes; ask your health care provider or pharmacist if you have questions. COMMON BRAND NAME(S): Mobic What should I tell my health care provider before I take this medicine? They need to know if you have any of these conditions: -bleeding disorders -cigarette smoker -coronary artery bypass graft (CABG) surgery within the past 2 weeks -drink more than 3 alcohol-containing drinks per day -heart disease -high blood pressure -history of stomach bleeding -kidney disease -liver disease -lung or breathing disease, like asthma -stomach or intestine problems -an unusual or allergic reaction to meloxicam, aspirin, other NSAIDs, other medicines, foods, dyes, or preservatives -pregnant or trying to get pregnant -breast-feeding How should I use this medicine? Take this medicine by mouth with a full glass of water. Follow the directions on the prescription label. You can take it with or without food. If it upsets your stomach, take it with food. Take your medicine at regular intervals. Do not take it more often than directed. Do not stop taking except on your doctor's advice. A special MedGuide will be given to you by the pharmacist with each prescription and refill. Be sure to read this information carefully each time. Talk to your pediatrician regarding the use of this medicine in children. While this drug may be prescribed for selected conditions, precautions do apply. Patients over 65 years old may have a stronger reaction and need a smaller dose. Overdosage: If you think you have taken too much of this medicine contact a poison control center or emergency room at once. NOTE: This medicine is only for you. Do  not share this medicine with others. What if I miss a dose? If you miss a dose, take it as soon as you can. If it is almost time for your next dose, take only that dose. Do not take double or extra doses. What may interact with this medicine? Do not take this medicine with any of the following medications: -cidofovir -ketorolac This medicine may also interact with the following medications: -aspirin and aspirin-like medicines -certain medicines for blood pressure, heart disease, irregular heart beat -certain medicines for depression, anxiety, or psychotic disturbances -certain medicines that treat or prevent blood clots like warfarin, enoxaparin, dalteparin, apixaban, dabigatran, rivaroxaban -cyclosporine -diuretics -methotrexate -other NSAIDs, medicines for pain and inflammation, like ibuprofen and naproxen -pemetrexed This list may not describe all possible interactions. Give your health care provider a list of all the medicines, herbs, non-prescription drugs, or dietary supplements you use. Also tell them if you smoke, drink alcohol, or use illegal drugs. Some items may interact with your medicine. What should I watch for while using this medicine? Tell your doctor or healthcare professional if your symptoms do not start to get better or if they get worse. Do not take other medicines that contain aspirin, ibuprofen, or naproxen with this medicine. Side effects such as stomach upset, nausea, or ulcers may be more likely to occur. Many medicines available without a prescription should not be taken with this medicine. This medicine can cause ulcers and bleeding in the stomach and intestines at any time during treatment. This can happen with no warning and may cause death. There is increased risk with taking this medicine for a long time. Smoking, drinking alcohol, older age, and   poor health can also increase risks. Call your doctor right away if you have stomach pain or blood in your vomit or  stool. This medicine does not prevent heart attack or stroke. In fact, this medicine may increase the chance of a heart attack or stroke. The chance may increase with longer use of this medicine and in people who have heart disease. If you take aspirin to prevent heart attack or stroke, talk with your doctor or health care professional. What side effects may I notice from receiving this medicine? Side effects that you should report to your doctor or health care professional as soon as possible: -allergic reactions like skin rash, itching or hives, swelling of the face, lips, or tongue -nausea, vomiting -signs and symptoms of a blood clot such as breathing problems; changes in vision; chest pain; severe, sudden headache; pain, swelling, warmth in the leg; trouble speaking; sudden numbness or weakness of the face, arm, or leg -signs and symptoms of bleeding such as bloody or black, tarry stools; red or dark-brown urine; spitting up blood or brown material that looks like coffee grounds; red spots on the skin; unusual bruising or bleeding from the eye, gums, or nose -signs and symptoms of liver injury like dark yellow or brown urine; general ill feeling or flu-like symptoms; light-colored stools; loss of appetite; nausea; right upper belly pain; unusually weak or tired; yellowing of the eyes or skin -signs and symptoms of stroke like changes in vision; confusion; trouble speaking or understanding; severe headaches; sudden numbness or weakness of the face, arm, or leg; trouble walking; dizziness; loss of balance or coordination Side effects that usually do not require medical attention (report to your doctor or health care professional if they continue or are bothersome): -constipation -diarrhea -gas This list may not describe all possible side effects. Call your doctor for medical advice about side effects. You may report side effects to FDA at 1-800-FDA-1088. Where should I keep my medicine? Keep out  of the reach of children. Store at room temperature between 15 and 30 degrees C (59 and 86 degrees F). Throw away any unused medicine after the expiration date. NOTE: This sheet is a summary. It may not cover all possible information. If you have questions about this medicine, talk to your doctor, pharmacist, or health care provider.  2019 Elsevier/Gold Standard (2017-11-15 11:22:56)  

## 2018-08-14 ENCOUNTER — Telehealth: Payer: Self-pay

## 2018-08-14 NOTE — Telephone Encounter (Signed)
Patient calling that her Edema is wrosening and was requesting Tawanna Sat to call her. Patient was advised Tawanna Sat is not in the office today and is here tomorrow and that if her Edema is worsening that she need an appointment. Scheduled patient for tomorrow. At 4:40 pm.

## 2018-08-15 ENCOUNTER — Other Ambulatory Visit: Payer: Self-pay

## 2018-08-15 ENCOUNTER — Emergency Department: Payer: Medicare HMO

## 2018-08-15 ENCOUNTER — Encounter: Payer: Self-pay | Admitting: Emergency Medicine

## 2018-08-15 ENCOUNTER — Encounter: Payer: Self-pay | Admitting: Physician Assistant

## 2018-08-15 ENCOUNTER — Ambulatory Visit (INDEPENDENT_AMBULATORY_CARE_PROVIDER_SITE_OTHER): Payer: Medicare HMO | Admitting: Physician Assistant

## 2018-08-15 VITALS — BP 131/86 | HR 90 | Temp 97.7°F | Resp 16 | Wt 313.0 lb

## 2018-08-15 DIAGNOSIS — I890 Lymphedema, not elsewhere classified: Secondary | ICD-10-CM | POA: Insufficient documentation

## 2018-08-15 DIAGNOSIS — I10 Essential (primary) hypertension: Secondary | ICD-10-CM | POA: Diagnosis not present

## 2018-08-15 DIAGNOSIS — Z7984 Long term (current) use of oral hypoglycemic drugs: Secondary | ICD-10-CM | POA: Insufficient documentation

## 2018-08-15 DIAGNOSIS — E119 Type 2 diabetes mellitus without complications: Secondary | ICD-10-CM | POA: Diagnosis not present

## 2018-08-15 DIAGNOSIS — R0601 Orthopnea: Secondary | ICD-10-CM

## 2018-08-15 DIAGNOSIS — R2243 Localized swelling, mass and lump, lower limb, bilateral: Secondary | ICD-10-CM | POA: Diagnosis present

## 2018-08-15 DIAGNOSIS — R601 Generalized edema: Secondary | ICD-10-CM

## 2018-08-15 DIAGNOSIS — R635 Abnormal weight gain: Secondary | ICD-10-CM | POA: Diagnosis not present

## 2018-08-15 DIAGNOSIS — R0600 Dyspnea, unspecified: Secondary | ICD-10-CM

## 2018-08-15 DIAGNOSIS — E669 Obesity, unspecified: Secondary | ICD-10-CM | POA: Diagnosis not present

## 2018-08-15 DIAGNOSIS — R0602 Shortness of breath: Secondary | ICD-10-CM | POA: Insufficient documentation

## 2018-08-15 DIAGNOSIS — Z79899 Other long term (current) drug therapy: Secondary | ICD-10-CM | POA: Insufficient documentation

## 2018-08-15 DIAGNOSIS — R918 Other nonspecific abnormal finding of lung field: Secondary | ICD-10-CM | POA: Diagnosis not present

## 2018-08-15 LAB — CBC
HEMATOCRIT: 34.7 % — AB (ref 36.0–46.0)
Hemoglobin: 10.9 g/dL — ABNORMAL LOW (ref 12.0–15.0)
MCH: 28.2 pg (ref 26.0–34.0)
MCHC: 31.4 g/dL (ref 30.0–36.0)
MCV: 89.7 fL (ref 80.0–100.0)
Platelets: 170 10*3/uL (ref 150–400)
RBC: 3.87 MIL/uL (ref 3.87–5.11)
RDW: 15.1 % (ref 11.5–15.5)
WBC: 8.7 10*3/uL (ref 4.0–10.5)
nRBC: 0 % (ref 0.0–0.2)

## 2018-08-15 LAB — TROPONIN I: Troponin I: <0.03 ng/mL (ref <0.03)

## 2018-08-15 LAB — BASIC METABOLIC PANEL
ANION GAP: 8 (ref 5–15)
BUN: 23 mg/dL — ABNORMAL HIGH (ref 6–20)
CO2: 24 mmol/L (ref 22–32)
Calcium: 9.3 mg/dL (ref 8.9–10.3)
Chloride: 106 mmol/L (ref 98–111)
Creatinine, Ser: 1.21 mg/dL — ABNORMAL HIGH (ref 0.44–1.00)
GFR calc Af Amer: >60 mL/min (ref >60)
GFR calc non Af Amer: 52 mL/min — ABNORMAL LOW (ref >60)
Glucose, Bld: 166 mg/dL — ABNORMAL HIGH (ref 70–99)
Potassium: 4 mmol/L (ref 3.5–5.1)
Sodium: 138 mmol/L (ref 135–145)

## 2018-08-15 LAB — POCT PREGNANCY, URINE: Preg Test, Ur: NEGATIVE

## 2018-08-15 NOTE — Progress Notes (Signed)
Patient: Barbara Anderson Female    DOB: [birth year]   48 y.o.   MRN: [id number] Visit Date: 08/15/2018  Today's Provider: Mar Daring, PA-C   Chief Complaint  Patient presents with  . Edema   Subjective:     HPI   Generalized edema From 08/05/2018-Stable. Diagnosis pulled for medication refill. Continue current medical treatment plan: furosemide (LASIX) 40 MG tablet; 1 tablet qd and Potassium Chloride ER 20 MEQ TBCR; 1 tablet qd.   Patient states she has swelling all over her body. Face, legs, arms. Patient also states she is wheezing from chest congestion. Has had an 11 pound weight increase over the last 7-10 days. Having SOB, DOE, orthopnea and PND. Does have strong family history of heart failure. Has had a niece that passed from heart failure due to severe morbid obesity at the age of 14.    No Known Allergies   Current Outpatient Medications:  .  ACCU-CHEK SOFTCLIX LANCETS lancets, Use as instructed, Disp: 100 each, Rfl: 12 .  Alcohol Swabs (B-D SINGLE USE SWABS REGULAR) PADS, USE TO CLEANSE SKIN DAILY BEFORE CHECKING BLOOD SUGAR, Disp: 100 each, Rfl: 1 .  atorvastatin (LIPITOR) 10 MG tablet, Take 1 tablet (10 mg total) by mouth daily., Disp: 90 tablet, Rfl: 3 .  Blood Glucose Monitoring Suppl (ACCU-CHEK AVIVA PLUS) w/Device KIT, To check blood sugar once daily, Disp: 1 kit, Rfl: 0 .  clotrimazole (LOTRIMIN) 1 % cream, Apply 1 application topically 2 (two) times daily., Disp: 30 g, Rfl: 0 .  DULoxetine (CYMBALTA) 60 MG capsule, Take 2 capsules (120 mg total) by mouth daily., Disp: 60 capsule, Rfl: 0 .  folic acid (FOLVITE) 1 MG tablet, Take 1 tablet (1 mg total) daily by mouth., Disp: 90 tablet, Rfl: 1 .  furosemide (LASIX) 40 MG tablet, Take 1 tablet (40 mg total) by mouth daily., Disp: 30 tablet, Rfl: 3 .  glucose blood (ACCU-CHEK AVIVA PLUS) test strip, To check blood sugar once daily, Disp: 100 each, Rfl: 12 .  hydrOXYzine (ATARAX/VISTARIL) 50 MG tablet, Take 1  tablet (50 mg total) by mouth 3 (three) times daily as needed for anxiety., Disp: 90 tablet, Rfl: 1 .  Lancets Misc. (ACCU-CHEK FASTCLIX LANCET) KIT, To check blood sugar once daily, Disp: 1 kit, Rfl: 0 .  lisinopril (PRINIVIL,ZESTRIL) 10 MG tablet, Take 1 tablet (10 mg total) by mouth daily., Disp: 90 tablet, Rfl: 3 .  meloxicam (MOBIC) 15 MG tablet, Take 1 tablet (15 mg total) by mouth daily., Disp: 30 tablet, Rfl: 0 .  metFORMIN (GLUCOPHAGE) 500 MG tablet, Take 1 tablet (500 mg total) by mouth 2 (two) times daily with a meal., Disp: 180 tablet, Rfl: 0 .  Potassium Chloride ER 20 MEQ TBCR, Take 1 tablet by mouth daily., Disp: 90 tablet, Rfl: 1 .  pregabalin (LYRICA) 200 MG capsule, Take 1 capsule (200 mg total) by mouth 2 (two) times daily., Disp: 180 capsule, Rfl: 1 .  risperiDONE (RISPERDAL) 1 MG tablet, Take 1 tablet (1 mg total) by mouth at bedtime., Disp: 30 tablet, Rfl: 1 .  topiramate (TOPAMAX) 200 MG tablet, Take 1 tablet (200 mg total) by mouth at bedtime., Disp: 30 tablet, Rfl: 1 .  traZODone (DESYREL) 150 MG tablet, Take 1 tablet (150 mg total) by mouth at bedtime., Disp: 30 tablet, Rfl: 1 .  Vitamin D, Ergocalciferol, (DRISDOL) 1.25 MG (50000 UT) CAPS capsule, TAKE 1 CAPSULE EVERY 7 DAYS, Disp: 12 capsule, Rfl: 0  Review of Systems  Constitutional: Negative for appetite change, chills, fatigue and fever.  HENT: Negative for congestion, ear pain, postnasal drip, sinus pressure and sinus pain.   Respiratory: Positive for shortness of breath and wheezing. Negative for chest tightness.   Cardiovascular: Positive for leg swelling. Negative for chest pain and palpitations.  Gastrointestinal: Negative for abdominal pain, nausea and vomiting.  Neurological: Negative for dizziness and weakness.  Psychiatric/Behavioral: Positive for sleep disturbance.    Social History   Tobacco Use  . Smoking status: Never Smoker  . Smokeless tobacco: Never Used  Substance Use Topics  . Alcohol use:  No    Alcohol/week: 0.0 standard drinks      Objective:   BP 131/86 (BP Location: Left Wrist, Patient Position: Supine, Cuff Size: Large)   Pulse 90   Temp 97.7 F (36.5 C) (Oral)   Resp 16   Wt (!) 313 lb (142 kg)   SpO2 99%   BMI 49.02 kg/m  Vitals:   08/15/18 1643  BP: 131/86  Pulse: 90  Resp: 16  Temp: 97.7 F (36.5 C)  TempSrc: Oral  SpO2: 99%  Weight: (!) 313 lb (142 kg)     Physical Exam Vitals signs reviewed.  Constitutional:      General: She is not in acute distress.    Appearance: She is well-developed. She is obese. She is not diaphoretic.     Comments: Fluid overload state  Neck:     Musculoskeletal: Normal range of motion and neck supple.     Thyroid: No thyromegaly.     Vascular: No JVD.     Trachea: No tracheal deviation.  Cardiovascular:     Rate and Rhythm: Normal rate and regular rhythm.     Heart sounds: Normal heart sounds. No murmur. No friction rub. No gallop.   Pulmonary:     Effort: Pulmonary effort is normal. No respiratory distress.     Breath sounds: Wheezing (inspiratory throughout) present. No rales.  Musculoskeletal:     Right lower leg: Edema (3+ pitting) present.     Left lower leg: Edema (3+ pitting edema) present.  Lymphadenopathy:     Cervical: No cervical adenopathy.  Neurological:     Mental Status: She is alert.    Lab Results  Component Value Date   WBC 9.5 07/10/2018   HGB 12.3 07/10/2018   HCT 36.9 07/10/2018   PLT 199 07/10/2018   GLUCOSE 211 (H) 07/10/2018   CHOL 202 (H) 04/11/2017   TRIG 149 04/11/2017   HDL 33 (L) 04/11/2017   LDLCALC 139 (H) 04/11/2017   ALT 12 (L) 04/09/2017   AST 17 04/09/2017   NA 139 07/10/2018   K 4.1 07/10/2018   CL 97 07/10/2018   CREATININE 0.97 07/10/2018   BUN 10 07/10/2018   CO2 23 07/10/2018   TSH 2.237 04/11/2017   HGBA1C 8.4 (H) 07/10/2018   MICROALBUR 50 07/21/2018   BNP    Component Value Date/Time   BNP 7.9 07/10/2018 0918    ProBNP No results found for:  PROBNP     Assessment & Plan    1. Weight gain Has had 11 pound weight gain in less than 10 days. Now having SOB, wheezing, orthopnea, PND with nighttime awakenings, DOE and generalized edema all over with 3+ pitting edema in the LE bilaterally. Highly suspicious for heart failure. Being that it is a Friday and she has not been responding well to oral furosemide I will refer to the ER for  further emergent evaluation. She is in agreement and will proceed via personal vehicle to [facility] ER.  - EKG 12-Lead  2. SOB (shortness of breath) EKG was NSR rate of 85. No ST changes.  - EKG 12-Lead  3. Generalized edema See above medical treatment plan. - EKG 12-Lead  4. Orthopnea See above medical treatment plan.  5. Nocturnal dyspnea See above medical treatment plan.     Mar Daring, PA-C  [facility]

## 2018-08-15 NOTE — ED Triage Notes (Signed)
Pt reports she went to MD today due to generalized swelling, pt reports noticed swelling to legs and has gained more than 10 lbs in the past week, reports shortness of breath unable to sleep reports unable to lye down, reports CHF runs in her family she has not been diagnosed with it pt talks in complete sentences ambulatory to triage with cane

## 2018-08-16 ENCOUNTER — Emergency Department
Admission: EM | Admit: 2018-08-16 | Discharge: 2018-08-16 | Disposition: A | Discharge status: Home / Self Care | Payer: Medicare HMO | Attending: Emergency Medicine | Admitting: Emergency Medicine

## 2018-08-16 DIAGNOSIS — I890 Lymphedema, not elsewhere classified: Secondary | ICD-10-CM

## 2018-08-16 DIAGNOSIS — E669 Obesity, unspecified: Secondary | ICD-10-CM

## 2018-08-16 HISTORY — DX: Type 2 diabetes mellitus without complications: E11.9

## 2018-08-16 HISTORY — DX: Dorsalgia, unspecified: M54.9

## 2018-08-16 LAB — HEPATIC FUNCTION PANEL
ALT: 21 U/L (ref 0–44)
AST: 20 U/L (ref 15–41)
Albumin: 3.6 g/dL (ref 3.5–5.0)
Alkaline Phosphatase: 85 U/L (ref 38–126)
BILIRUBIN TOTAL: 0.6 mg/dL (ref 0.3–1.2)
Total Protein: 7 g/dL (ref 6.5–8.1)

## 2018-08-16 LAB — TSH: TSH: 6.573 u[IU]/mL — ABNORMAL HIGH (ref 0.350–4.500)

## 2018-08-16 LAB — T4, FREE: Free T4: 0.78 ng/dL — ABNORMAL LOW (ref 0.82–1.77)

## 2018-08-16 LAB — TROPONIN I

## 2018-08-16 LAB — BRAIN NATRIURETIC PEPTIDE: B NATRIURETIC PEPTIDE 5: 12 pg/mL (ref 0.0–100.0)

## 2018-08-16 NOTE — ED Provider Notes (Signed)
[facility] Emergency Department Provider Note  ____________________________________________   First MD Initiated Contact with Patient 08/16/18 0038     (approximate)  I have reviewed the triage vital signs and the nursing notes.   HISTORY  Chief Complaint Leg Swelling and Shortness of Breath   HPI Barbara Anderson is a 48 y.o. female comes to the emergency department with bilateral lower extremity swelling and 10 pound weight gain in the past week along with shortness of breath and difficulty lying flat.  She is concerned she may have developed congestive heart failure as it "runs in my family".   She was seen today by a primary care physician's assistant who advised her to come to the emergency department for evaluation for CHF.  The patient has a past medical history of morbid obesity along with depression and diabetes mellitus and hypertension.  She denies any chest pain.  She denies fevers or chills.  Her symptoms been gradual onset slowly progressive are now moderate severity and nothing seems to make them better or worse.  Her primary care physician's assistant had prescribed her furosemide several weeks ago and the patient reports no improvement in her symptoms.   Past Medical History:  Diagnosis Date  . ADD (attention deficit disorder)   . Anxiety   . Back pain   . Concussion 2001   head trauma-work related. Caused headaches and short term memory loss.  . Depression   . Diabetes mellitus without complication ([place])   . Overactive bladder   . Recurrent vaginitis     Patient Active Problem List   Diagnosis Date Noted  . Type 2 diabetes mellitus with microalbuminuria, without long-term current use of insulin ([place]) 07/21/2018  . Suicidal ideation 04/11/2017  . HTN (hypertension) 04/11/2017  . Chronic pain syndrome 04/11/2017  . PTSD (post-traumatic stress disorder) 04/11/2017  . OCD (obsessive compulsive disorder) 04/11/2017  . Panic disorder with  agoraphobia 04/11/2017  . TBI (traumatic brain injury) ([place]) 04/11/2017  . Bipolar I disorder, current or most recent episode depressed, with psychotic features ([place]) 04/10/2017  . Difficulty concentrating 11/24/2015  . Abnormal bruising 10/26/2015  . Accumulation of fluid in tissues 10/26/2015  . Fibrositis 10/26/2015  . Cervical pain 10/26/2015  . Morbid obesity ([place]) 10/26/2015  . Disorder of labyrinth 10/26/2015  . Oligomenorrhea 08/30/2015  . Weight gain 08/30/2015  . Costochondritis 04/14/2015  . Dyspnea 04/14/2015  . Recurrent vaginitis   . Clinical depression 09/21/2008  . Late effect of certain complications of trauma 32/44/0102  . Anal bleeding 09/10/2008  . Headache, variant migraine, intractable 04/16/2006    Past Surgical History:  Procedure Laterality Date  . ankle pin and plates placement     right  . COSMETIC SURGERY  2005   BREASTS, ARM, ABDOMEN AFTER WEIGHT LOSS    Prior to Admission medications   Medication Sig Start Date End Date Taking? Authorizing Provider  ACCU-CHEK SOFTCLIX LANCETS lancets Use as instructed 07/24/18   Mar Daring, PA-C  Alcohol Swabs (B-D SINGLE USE SWABS REGULAR) PADS USE TO CLEANSE SKIN DAILY BEFORE CHECKING BLOOD SUGAR 10/03/17   Mar Daring, PA-C  atorvastatin (LIPITOR) 10 MG tablet Take 1 tablet (10 mg total) by mouth daily. 07/21/18   Mar Daring, PA-C  Blood Glucose Monitoring Suppl (ACCU-CHEK AVIVA PLUS) w/Device KIT To check blood sugar once daily 07/21/18   Mar Daring, PA-C  clotrimazole (LOTRIMIN) 1 % cream Apply 1 application topically 2 (two) times daily. 05/21/18  Virginia Crews, MD  DULoxetine (CYMBALTA) 60 MG capsule Take 2 capsules (120 mg total) by mouth daily. 04/15/17   Pucilowska, Wardell Honour, MD  folic acid (FOLVITE) 1 MG tablet Take 1 tablet (1 mg total) daily by mouth. 06/13/17   Burnette, Clearnce Sorrel, PA-C  glucose blood (ACCU-CHEK AVIVA PLUS) test strip To check blood sugar  once daily 07/21/18   Mar Daring, PA-C  hydrOXYzine (ATARAX/VISTARIL) 50 MG tablet Take 1 tablet (50 mg total) by mouth 3 (three) times daily as needed for anxiety. 04/15/17   Pucilowska, Wardell Honour, MD  Lancets Misc. (ACCU-CHEK FASTCLIX LANCET) KIT To check blood sugar once daily 07/21/18   Fenton Malling M, PA-C  levothyroxine (SYNTHROID, LEVOTHROID) 25 MCG tablet Take 1 tablet (25 mcg total) by mouth daily before breakfast. 08/18/18   Mar Daring, PA-C  lisinopril (PRINIVIL,ZESTRIL) 10 MG tablet Take 1 tablet (10 mg total) by mouth daily. 07/21/18   Mar Daring, PA-C  meloxicam (MOBIC) 15 MG tablet Take 1 tablet (15 mg total) by mouth daily. 08/05/18   Mar Daring, PA-C  metFORMIN (GLUCOPHAGE) 500 MG tablet Take 1 tablet (500 mg total) by mouth 2 (two) times daily with a meal. 07/21/18   Burnette, Clearnce Sorrel, PA-C  Potassium Chloride ER 20 MEQ TBCR Take 1 tablet by mouth daily. 08/05/18   Mar Daring, PA-C  pregabalin (LYRICA) 200 MG capsule Take 1 capsule (200 mg total) by mouth 2 (two) times daily. 06/16/18   Mar Daring, PA-C  risperiDONE (RISPERDAL) 1 MG tablet Take 1 tablet (1 mg total) by mouth at bedtime. 04/15/17   Pucilowska, Herma Ard B, MD  topiramate (TOPAMAX) 200 MG tablet Take 1 tablet (200 mg total) by mouth at bedtime. 04/15/17   Pucilowska, Herma Ard B, MD  traZODone (DESYREL) 150 MG tablet Take 1 tablet (150 mg total) by mouth at bedtime. 04/15/17   Pucilowska, Wardell Honour, MD  Vitamin D, Ergocalciferol, (DRISDOL) 1.25 MG (50000 UT) CAPS capsule TAKE 1 CAPSULE EVERY 7 DAYS 06/12/18   Mar Daring, PA-C    Allergies Patient has no known allergies.  Family History  Problem Relation Age of Onset  . Diabetes Mother   . Hypertension Mother   . Depression Mother   . Diabetes Father   . Hypertension Father   . Anxiety disorder Father   . Depression Father   . ADD / ADHD Father   . Depression Sister   . Bipolar disorder  Sister   . ADD / ADHD Brother     Social History Social History   Tobacco Use  . Smoking status: Never Smoker  . Smokeless tobacco: Never Used  Substance Use Topics  . Alcohol use: No    Alcohol/week: 0.0 standard drinks  . Drug use: No    Review of Systems Constitutional: No fever/chills Eyes: No visual changes. ENT: No sore throat. Cardiovascular: Denies chest pain. Respiratory: Positive for shortness of breath. Gastrointestinal: No abdominal pain.  No nausea, no vomiting.  No diarrhea.  No constipation. Genitourinary: Negative for dysuria. Musculoskeletal: Positive for leg swelling Skin: Negative for rash. Neurological: Negative for headaches, focal weakness or numbness.   ____________________________________________   PHYSICAL EXAM:  VITAL SIGNS: ED Triage Vitals  Enc Vitals Group     BP 08/15/18 2041 (!) 155/63     Pulse Rate 08/15/18 2041 86     Resp 08/15/18 2041 16     Temp 08/15/18 2041 98 F (36.7 C)  Temp Source 08/15/18 2041 Oral     SpO2 08/15/18 2041 99 %     Weight 08/15/18 2043 (!) 321 lb 10.4 oz (145.9 kg)     Height 08/15/18 2043 '5\' 7"'  (1.702 m)     Head Circumference --      Peak Flow --      Pain Score 08/15/18 2042 7     Pain Loc --      Pain Edu? --      Excl. in [place]? --     Constitutional: Alert and oriented x4 somewhat anxious appearing although nontoxic no diaphoresis and speaks in full clear sentences Eyes: PERRL EOMI. Head: Atraumatic. Nose: No congestion/rhinnorhea. Mouth/Throat: No trismus Neck: No stridor.  Able to lie completely flat.  JVD unable to be evaluated secondary to morbid obesity Cardiovascular: Normal rate, regular rhythm. Grossly normal heart sounds.  Good peripheral circulation. Respiratory: Normal respiratory effort.  No retractions. Lungs CTAB and moving good air Gastrointestinal: Morbidly obese Musculoskeletal: Bilateral lower extremities quite swollen with no pitting edema appreciated.  Legs are equal in  size Neurologic:  Normal speech and language. No gross focal neurologic deficits are appreciated. Skin:  Skin is warm, dry and intact. No rash noted. Psychiatric: Mood and affect are normal. Speech and behavior are normal.    ____________________________________________   DIFFERENTIAL includes but not limited to  Heart failure, lymphedema, myxedema, venous stasis ____________________________________________   LABS (all labs ordered are listed, but only abnormal results are displayed)  Labs Reviewed  BASIC METABOLIC PANEL - Abnormal; Notable for the following components:      Result Value   Glucose, Bld 166 (*)    BUN 23 (*)    Creatinine, Ser 1.21 (*)    GFR calc non Af Amer 52 (*)    All other components within normal limits  CBC - Abnormal; Notable for the following components:   Hemoglobin 10.9 (*)    HCT 34.7 (*)    All other components within normal limits  TSH - Abnormal; Notable for the following components:   TSH 6.573 (*)    All other components within normal limits  T4, FREE - Abnormal; Notable for the following components:   Free T4 0.78 (*)    All other components within normal limits  TROPONIN I  BRAIN NATRIURETIC PEPTIDE  TROPONIN I  HEPATIC FUNCTION PANEL  POCT PREGNANCY, URINE    Lab work reviewed by me with normal thyroid function and normal beta natruretic peptide with delta troponin which is negative with no evidence of pulmonary edema or heart failure __________________________________________  EKG  ED ECG REPORT I, Darel Hong, the attending physician, personally viewed and interpreted this ECG.  Date: 08/16/2018 EKG Time:  Rate: 89 Rhythm: normal sinus rhythm QRS Axis: normal Intervals: normal ST/T Wave abnormalities: normal Narrative Interpretation: no evidence of acute ischemia  ____________________________________________  RADIOLOGY  Chest x-ray reviewed by me consistent with  atelectasis ____________________________________________   PROCEDURES  Procedure(s) performed: no  Procedures  Critical Care performed: no  ____________________________________________   INITIAL IMPRESSION / ASSESSMENT AND PLAN / ED COURSE  Pertinent labs & imaging results that were available during my care of the patient were reviewed by me and considered in my medical decision making (see chart for details).   As part of my medical decision making, I reviewed the following data within the [place] History obtained from family if available, nursing notes, old chart and ekg, as well as notes from prior ED  visits.  The patient comes to the emergency department with gradual onset weight gain and bilateral lower extremity edema.  She is concerned about heart failure as was her primary care midlevel provider.  The patient does have significant swelling in bilateral lower extremities although it is not pitting and she is able to lie completely flat with no crackles at her bases and her physical exam is inconsistent with heart failure.  I did obtain a chest x-ray showing slight atelectasis and lab work including beta natruretic peptide which is negative.  I added on thyroid function test as it would be more likely that the patient would have hypothyroidism however these are unremarkable as well.  I discussed with the patient that her edema is likely secondary to lymphedema and venous stasis as well as her morbid obesity.  These do not respond to furosemide and I have encouraged her to stop this medication as she is getting no benefit and only side effects.  I will refer her to vascular surgery as an outpatient to discuss Unna boots and have encouraged the patient to begin weight loss.  She feels significant reassurance and will be discharged home with primary care and vascular follow-up.  Strict return precautions been given.       ____________________________________________   FINAL CLINICAL IMPRESSION(S) / ED DIAGNOSES  Final diagnoses:  Lymphedema  Obesity without serious comorbidity, unspecified classification, unspecified obesity type      NEW MEDICATIONS STARTED DURING THIS VISIT:  Discharge Medication List as of 08/16/2018  2:12 AM       Note:  This document was prepared using Dragon voice recognition software and may include unintentional dictation errors.    Darel Hong, MD 08/19/18 [phone]

## 2018-08-16 NOTE — Discharge Instructions (Addendum)
Fortunately today your lab work was very reassuring and you have no signs of heart failure.  Please STOP TAKING YOUR WATER PILL (lasix) as it won't be helpful for you.  I need you to follow up with the Vascular surgeons for a recheck and they can discuss how to best treat your leg swelling.  Return to the ED sooner for any concerns.  It was a pleasure to take care of you today, and thank you for coming to our emergency department.  If you have any questions or concerns before leaving please ask the nurse to grab me and I'm more than happy to go through your aftercare instructions again.  If you were prescribed any opioid pain medication today such as Norco, Vicodin, Percocet, morphine, hydrocodone, or oxycodone please make sure you do not drive when you are taking this medication as it can alter your ability to drive safely.  If you have any concerns once you are home that you are not improving or are in fact getting worse before you can make it to your follow-up appointment, please do not hesitate to call [phone] and come back for further evaluation.  Darel Hong, MD  Results for orders placed or performed during the hospital encounter of 44/96/75  Basic metabolic panel  Result Value Ref Range   Sodium 138 135 - 145 mmol/L   Potassium 4.0 3.5 - 5.1 mmol/L   Chloride 106 98 - 111 mmol/L   CO2 24 22 - 32 mmol/L   Glucose, Bld 166 (H) 70 - 99 mg/dL   BUN 23 (H) 6 - 20 mg/dL   Creatinine, Ser 1.21 (H) 0.44 - 1.00 mg/dL   Calcium 9.3 8.9 - 10.3 mg/dL   GFR calc non Af Amer 52 (L) >60 mL/min   GFR calc Af Amer >60 >60 mL/min   Anion gap 8 5 - 15  CBC  Result Value Ref Range   WBC 8.7 4.0 - 10.5 K/uL   RBC 3.87 3.87 - 5.11 MIL/uL   Hemoglobin 10.9 (L) 12.0 - 15.0 g/dL   HCT 34.7 (L) 36.0 - 46.0 %   MCV 89.7 80.0 - 100.0 fL   MCH 28.2 26.0 - 34.0 pg   MCHC 31.4 30.0 - 36.0 g/dL   RDW 15.1 11.5 - 15.5 %   Platelets 170 150 - 400 K/uL   nRBC 0.0 0.0 - 0.2 %  Troponin I - ONCE - STAT    Result Value Ref Range   Troponin I <0.03 <0.03 ng/mL  Brain natriuretic peptide  Result Value Ref Range   B Natriuretic Peptide 12.0 0.0 - 100.0 pg/mL  Hepatic function panel  Result Value Ref Range   Total Protein 7.0 6.5 - 8.1 g/dL   Albumin 3.6 3.5 - 5.0 g/dL   AST 20 15 - 41 U/L   ALT 21 0 - 44 U/L   Alkaline Phosphatase 85 38 - 126 U/L   Total Bilirubin 0.6 0.3 - 1.2 mg/dL   Bilirubin, Direct <0.1 0.0 - 0.2 mg/dL   Indirect Bilirubin NOT CALCULATED 0.3 - 0.9 mg/dL  Pregnancy, urine POC  Result Value Ref Range   Preg Test, Ur NEGATIVE NEGATIVE   Dg Chest 2 View  Result Date: 08/15/2018 CLINICAL DATA:  Swelling in legs. EXAM: CHEST - 2 VIEW COMPARISON:  April 30, 2014 FINDINGS: The heart, hila, and mediastinum are normal. No pneumothorax. No nodules or masses. Mild opacity in the left base is somewhat platelike. No overt edema. IMPRESSION: Platelike opacity  in left base favored to represent atelectasis rather than infiltrate. Recommend clinical correlation. No cardiomegaly or overt edema. Electronically Signed   By: Dorise Bullion III M.D   On: 08/15/2018 21:06

## 2018-08-18 ENCOUNTER — Telehealth: Payer: Self-pay

## 2018-08-18 ENCOUNTER — Other Ambulatory Visit: Payer: Self-pay | Admitting: Physician Assistant

## 2018-08-18 DIAGNOSIS — E034 Atrophy of thyroid (acquired): Secondary | ICD-10-CM

## 2018-08-18 MED ORDER — LEVOTHYROXINE SODIUM 25 MCG PO TABS: 25.0000 ug | ORAL_TABLET | Freq: Every day | ORAL | 0 refills | Status: DC

## 2018-08-18 NOTE — Progress Notes (Signed)
Sent in levothyroxine for new onset hypothyroid

## 2018-08-18 NOTE — Telephone Encounter (Signed)
Pt advised.  Please send to [facility]. Apt made for 09/26/2018 at 4pm.   Thanks,   -Mickel Baas

## 2018-08-18 NOTE — Telephone Encounter (Signed)
-----   Message from Mar Daring, [place] sent at 08/18/2018 11:53 AM EST ----- Labs from ER sow she has an underactive thyroid indicating hypothyroidism. I will send in levothyroxine for her, then she needs to come back in 6 weeks for recheck and labs.

## 2018-08-19 ENCOUNTER — Telehealth: Payer: Self-pay | Admitting: Physician Assistant

## 2018-08-19 DIAGNOSIS — R7989 Other specified abnormal findings of blood chemistry: Secondary | ICD-10-CM

## 2018-08-19 NOTE — Telephone Encounter (Signed)
Please advise 

## 2018-08-19 NOTE — Telephone Encounter (Signed)
Pt went to [facility].  She doesn't remember them doing a kidney function test on her.  Asking if Tawanna Sat could verify if this was done.  Please advise.  Thanks, [employer]

## 2018-08-20 ENCOUNTER — Encounter: Payer: Self-pay | Admitting: Physician Assistant

## 2018-08-20 ENCOUNTER — Other Ambulatory Visit: Payer: Self-pay | Admitting: Physician Assistant

## 2018-08-20 ENCOUNTER — Ambulatory Visit (INDEPENDENT_AMBULATORY_CARE_PROVIDER_SITE_OTHER): Payer: Medicare HMO | Admitting: Physician Assistant

## 2018-08-20 VITALS — BP 161/93 | HR 70 | Temp 98.3°F | Resp 16 | Wt 311.0 lb

## 2018-08-20 DIAGNOSIS — R600 Localized edema: Secondary | ICD-10-CM | POA: Diagnosis not present

## 2018-08-20 DIAGNOSIS — J0140 Acute pansinusitis, unspecified: Secondary | ICD-10-CM | POA: Diagnosis not present

## 2018-08-20 DIAGNOSIS — J40 Bronchitis, not specified as acute or chronic: Secondary | ICD-10-CM

## 2018-08-20 MED ORDER — ALBUTEROL SULFATE HFA 108 (90 BASE) MCG/ACT IN AERS: 2.0000 | INHALATION_SPRAY | RESPIRATORY_TRACT | 0 refills | Status: DC | PRN

## 2018-08-20 MED ORDER — FLUTICASONE PROPIONATE 50 MCG/ACT NA SUSP: 2.0000 | Freq: Every day | NASAL | 6 refills | Status: DC

## 2018-08-20 MED ORDER — FUROSEMIDE 40 MG PO TABS: 40.0000 mg | ORAL_TABLET | Freq: Every day | ORAL | 3 refills | Status: DC

## 2018-08-20 MED ORDER — AMOXICILLIN-POT CLAVULANATE 875-125 MG PO TABS: 1.0000 | ORAL_TABLET | Freq: Two times a day (BID) | ORAL | 0 refills | Status: DC

## 2018-08-20 NOTE — Patient Instructions (Signed)
Acute Bronchitis, Adult Acute bronchitis is when air tubes (bronchi) in the lungs suddenly get swollen. The condition can make it hard to breathe. It can also cause these symptoms:  A cough.  Coughing up clear, yellow, or green mucus.  Wheezing.  Chest congestion.  Shortness of breath.  A fever.  Body aches.  Chills.  A sore throat. Follow these instructions at home:  Medicines  Take over-the-counter and prescription medicines only as told by your doctor.  If you were prescribed an antibiotic medicine, take it as told by your doctor. Do not stop taking the antibiotic even if you start to feel better. General instructions  Rest.  Drink enough fluids to keep your pee (urine) pale yellow.  Avoid smoking and secondhand smoke. If you smoke and you need help quitting, ask your doctor. Quitting will help your lungs heal faster.  Use an inhaler, cool mist vaporizer, or humidifier as told by your doctor.  Keep all follow-up visits as told by your doctor. This is important. How is this prevented? To lower your risk of getting this condition again:  Wash your hands often with soap and water. If you cannot use soap and water, use hand sanitizer.  Avoid contact with people who have cold symptoms.  Try not to touch your hands to your mouth, nose, or eyes.  Make sure to get the flu shot every year. Contact a doctor if:  Your symptoms do not get better in 2 weeks. Get help right away if:  You cough up blood.  You have chest pain.  You have very bad shortness of breath.  You become dehydrated.  You faint (pass out) or keep feeling like you are going to pass out.  You keep throwing up (vomiting).  You have a very bad headache.  Your fever or chills gets worse. This information is not intended to replace advice given to you by your health care provider. Make sure you discuss any questions you have with your health care provider. Document Released: 01/02/2008 Document  Revised: 02/27/2017 Document Reviewed: 01/04/2016 Elsevier Interactive Patient Education  2019 [employer] Inc. Sinusitis, Adult Sinusitis is soreness and swelling (inflammation) of your sinuses. Sinuses are hollow spaces in the bones around your face. They are located:  Around your eyes.  In the middle of your forehead.  Behind your nose.  In your cheekbones. Your sinuses and nasal passages are lined with a fluid called mucus. Mucus drains out of your sinuses. Swelling can trap mucus in your sinuses. This lets germs (bacteria, virus, or fungus) grow, which leads to infection. Most of the time, this condition is caused by a virus. What are the causes? This condition is caused by:  Allergies.  Asthma.  Germs.  Things that block your nose or sinuses.  Growths in the nose (nasal polyps).  Chemicals or irritants in the air.  Fungus (rare). What increases the risk? You are more likely to develop this condition if:  You have a weak body defense system (immune system).  You do a lot of swimming or diving.  You use nasal sprays too much.  You smoke. What are the signs or symptoms? The main symptoms of this condition are pain and a feeling of pressure around the sinuses. Other symptoms include:  Stuffy nose (congestion).  Runny nose (drainage).  Swelling and warmth in the sinuses.  Headache.  Toothache.  A cough that may get worse at night.  Mucus that collects in the throat or the back of the  nose (postnasal drip).  Being unable to smell and taste.  Being very tired (fatigue).  A fever.  Sore throat.  Bad breath. How is this diagnosed? This condition is diagnosed based on:  Your symptoms.  Your medical history.  A physical exam.  Tests to find out if your condition is short-term (acute) or long-term (chronic). Your doctor may: ? Check your nose for growths (polyps). ? Check your sinuses using a tool that has a light (endoscope). ? Check for allergies  or germs. ? Do imaging tests, such as an MRI or CT scan. How is this treated? Treatment for this condition depends on the cause and whether it is short-term or long-term.  If caused by a virus, your symptoms should go away on their own within 10 days. You may be given medicines to relieve symptoms. They include: ? Medicines that shrink swollen tissue in the nose. ? Medicines that treat allergies (antihistamines). ? A spray that treats swelling of the nostrils. ? Rinses that help get rid of thick mucus in your nose (nasal saline washes).  If caused by bacteria, your doctor may wait to see if you will get better without treatment. You may be given antibiotic medicine if you have: ? A very bad infection. ? A weak body defense system.  If caused by growths in the nose, you may need to have surgery. Follow these instructions at home: Medicines  Take, use, or apply over-the-counter and prescription medicines only as told by your doctor. These may include nasal sprays.  If you were prescribed an antibiotic medicine, take it as told by your doctor. Do not stop taking the antibiotic even if you start to feel better. Hydrate and humidify   Drink enough water to keep your pee (urine) pale yellow.  Use a cool mist humidifier to keep the humidity level in your home above 50%.  Breathe in steam for 10-15 minutes, 3-4 times a day, or as told by your doctor. You can do this in the bathroom while a hot shower is running.  Try not to spend time in cool or dry air. Rest  Rest as much as you can.  Sleep with your head raised (elevated).  Make sure you get enough sleep each night. General instructions   Put a warm, moist washcloth on your face 3-4 times a day, or as often as told by your doctor. This will help with discomfort.  Wash your hands often with soap and water. If there is no soap and water, use hand sanitizer.  Do not smoke. Avoid being around people who are smoking (secondhand  smoke).  Keep all follow-up visits as told by your doctor. This is important. Contact a doctor if:  You have a fever.  Your symptoms get worse.  Your symptoms do not get better within 10 days. Get help right away if:  You have a very bad headache.  You cannot stop throwing up (vomiting).  You have very bad pain or swelling around your face or eyes.  You have trouble seeing.  You feel confused.  Your neck is stiff.  You have trouble breathing. Summary  Sinusitis is swelling of your sinuses. Sinuses are hollow spaces in the bones around your face.  This condition is caused by tissues in your nose that become inflamed or swollen. This traps germs. These can lead to infection.  If you were prescribed an antibiotic medicine, take it as told by your doctor. Do not stop taking it even if you  start to feel better.  Keep all follow-up visits as told by your doctor. This is important. This information is not intended to replace advice given to you by your health care provider. Make sure you discuss any questions you have with your health care provider. Document Released: 01/02/2008 Document Revised: 12/16/2017 Document Reviewed: 12/16/2017 Elsevier Interactive Patient Education  2019 [employer].

## 2018-08-20 NOTE — Telephone Encounter (Signed)
LMTCB

## 2018-08-20 NOTE — Telephone Encounter (Signed)
Yes this was signed for her.

## 2018-08-20 NOTE — Telephone Encounter (Signed)
Patient advised as directed below. Per patient she has URI symptoms requested an appointment for this afternoon. Explained to patient is just for acute visit only and to make sure she wold be able to come before scheduling her. I think I ordered the correct test for her.

## 2018-08-20 NOTE — Telephone Encounter (Signed)
They did and actually it was slightly increased, meaning that her kidney function declined slightly. This is most likely due to decreased fluid intake and increased dosing of the fluid pill. Make sure to push fluids. We can recheck in 1 week.

## 2018-08-20 NOTE — Progress Notes (Signed)
Patient: Barbara Anderson Female    DOB: [birth year]   48 y.o.   MRN: [id number] Visit Date: 08/20/2018  Today's Provider: Mar Daring, PA-C   Chief Complaint  Patient presents with  . Sinusitis   Subjective:     Sinusitis  This is a new problem. The current episode started 1 to 4 weeks ago (4 weeks). There has been no fever. Associated symptoms include chills, congestion, coughing, shortness of breath, sinus pressure, sneezing and a sore throat. Pertinent negatives include no diaphoresis, ear pain, headaches, hoarse voice, neck pain or swollen glands. Past treatments include nothing.   Patient has had sinus congestion and pressure for about 4 weeks. Patient states she has symptoms of chills, cough, shortness of breath, sore throat, and wheezing. Patient has not been taking any medications to help with symptoms.  No Known Allergies   Current Outpatient Medications:  .  ACCU-CHEK SOFTCLIX LANCETS lancets, Use as instructed, Disp: 100 each, Rfl: 12 .  Alcohol Swabs (B-D SINGLE USE SWABS REGULAR) PADS, USE TO CLEANSE SKIN DAILY BEFORE CHECKING BLOOD SUGAR, Disp: 100 each, Rfl: 1 .  atorvastatin (LIPITOR) 10 MG tablet, Take 1 tablet (10 mg total) by mouth daily., Disp: 90 tablet, Rfl: 3 .  Blood Glucose Monitoring Suppl (ACCU-CHEK AVIVA PLUS) w/Device KIT, To check blood sugar once daily, Disp: 1 kit, Rfl: 0 .  clotrimazole (LOTRIMIN) 1 % cream, Apply 1 application topically 2 (two) times daily., Disp: 30 g, Rfl: 0 .  DULoxetine (CYMBALTA) 60 MG capsule, Take 2 capsules (120 mg total) by mouth daily., Disp: 60 capsule, Rfl: 0 .  folic acid (FOLVITE) 1 MG tablet, Take 1 tablet (1 mg total) daily by mouth., Disp: 90 tablet, Rfl: 1 .  glucose blood (ACCU-CHEK AVIVA PLUS) test strip, To check blood sugar once daily, Disp: 100 each, Rfl: 12 .  hydrOXYzine (ATARAX/VISTARIL) 50 MG tablet, Take 1 tablet (50 mg total) by mouth 3 (three) times daily as needed for anxiety., Disp: 90  tablet, Rfl: 1 .  Lancets Misc. (ACCU-CHEK FASTCLIX LANCET) KIT, To check blood sugar once daily, Disp: 1 kit, Rfl: 0 .  levothyroxine (SYNTHROID, LEVOTHROID) 25 MCG tablet, Take 1 tablet (25 mcg total) by mouth daily before breakfast., Disp: 90 tablet, Rfl: 0 .  lisinopril (PRINIVIL,ZESTRIL) 10 MG tablet, Take 1 tablet (10 mg total) by mouth daily., Disp: 90 tablet, Rfl: 3 .  meloxicam (MOBIC) 15 MG tablet, Take 1 tablet (15 mg total) by mouth daily., Disp: 30 tablet, Rfl: 0 .  metFORMIN (GLUCOPHAGE) 500 MG tablet, Take 1 tablet (500 mg total) by mouth 2 (two) times daily with a meal., Disp: 180 tablet, Rfl: 0 .  Potassium Chloride ER 20 MEQ TBCR, Take 1 tablet by mouth daily., Disp: 90 tablet, Rfl: 1 .  pregabalin (LYRICA) 200 MG capsule, Take 1 capsule (200 mg total) by mouth 2 (two) times daily., Disp: 180 capsule, Rfl: 1 .  risperiDONE (RISPERDAL) 1 MG tablet, Take 1 tablet (1 mg total) by mouth at bedtime., Disp: 30 tablet, Rfl: 1 .  topiramate (TOPAMAX) 200 MG tablet, Take 1 tablet (200 mg total) by mouth at bedtime., Disp: 30 tablet, Rfl: 1 .  traZODone (DESYREL) 150 MG tablet, Take 1 tablet (150 mg total) by mouth at bedtime., Disp: 30 tablet, Rfl: 1 .  Vitamin D, Ergocalciferol, (DRISDOL) 1.25 MG (50000 UT) CAPS capsule, TAKE 1 CAPSULE EVERY 7 DAYS, Disp: 12 capsule, Rfl: 0  Review of Systems  Constitutional:  Positive for chills. Negative for appetite change, diaphoresis, fatigue and fever.  HENT: Positive for congestion, sinus pressure, sneezing and sore throat. Negative for ear pain and hoarse voice.   Respiratory: Positive for cough, shortness of breath and wheezing. Negative for chest tightness.   Cardiovascular: Negative for chest pain and palpitations.  Gastrointestinal: Negative for abdominal pain, nausea and vomiting.  Musculoskeletal: Negative for neck pain.  Neurological: Negative for dizziness, weakness and headaches.    Social History   Tobacco Use  . Smoking status:  Never Smoker  . Smokeless tobacco: Never Used  Substance Use Topics  . Alcohol use: No    Alcohol/week: 0.0 standard drinks      Objective:   BP (!) 161/93 (BP Location: Left Arm, Patient Position: Sitting, Cuff Size: Large)   Pulse 70   Temp 98.3 F (36.8 C) (Oral)   Resp 16   Wt (!) 311 lb (141.1 kg)   LMP 07/31/2018 (Approximate)   SpO2 99%   BMI 48.71 kg/m  Vitals:   08/20/18 1718  BP: (!) 161/93  Pulse: 70  Resp: 16  Temp: 98.3 F (36.8 C)  TempSrc: Oral  SpO2: 99%  Weight: (!) 311 lb (141.1 kg)     Physical Exam Vitals signs reviewed.  Constitutional:      General: She is not in acute distress.    Appearance: She is well-developed. She is not diaphoretic.  HENT:     Head: Normocephalic and atraumatic.     Right Ear: Hearing, tympanic membrane, ear canal and external ear normal.     Left Ear: Hearing, tympanic membrane, ear canal and external ear normal.     Nose:     Right Sinus: Maxillary sinus tenderness and frontal sinus tenderness present.     Left Sinus: Maxillary sinus tenderness and frontal sinus tenderness present.     Mouth/Throat:     Pharynx: Uvula midline. No oropharyngeal exudate.  Neck:     Musculoskeletal: Normal range of motion and neck supple.     Thyroid: No thyromegaly.     Trachea: No tracheal deviation.  Cardiovascular:     Rate and Rhythm: Normal rate and regular rhythm.     Heart sounds: Normal heart sounds. No murmur. No friction rub. No gallop.   Pulmonary:     Effort: Pulmonary effort is normal. No respiratory distress.     Breath sounds: No stridor. Wheezing (expiratory throughout) present. No rales.  Lymphadenopathy:     Cervical: No cervical adenopathy.        Assessment & Plan    1. Acute non-recurrent pansinusitis Worsening symptoms that have not responded to OTC medications. Will give augmentin as below. Continue allergy medications. Stay well hydrated and get plenty of rest. Call if no symptom improvement or if  symptoms worsen. - amoxicillin-clavulanate (AUGMENTIN) 875-125 MG tablet; Take 1 tablet by mouth 2 (two) times daily.  Dispense: 20 tablet; Refill: 0 - fluticasone (FLONASE) 50 MCG/ACT nasal spray; Place 2 sprays into both nostrils daily.  Dispense: 16 g; Refill: 6  2. Bronchitis Albuterol for wheezing as below. Call if not improving or come to the office if she needs instruction on use of inhaler.  - albuterol (PROVENTIL HFA;VENTOLIN HFA) 108 (90 Base) MCG/ACT inhaler; Inhale 2 puffs into the lungs every 4 (four) hours as needed for wheezing or shortness of breath.  Dispense: 1 Inhaler; Refill: 0  3. Bilateral lower extremity edema Stable. Diagnosis pulled for medication refill. Continue current medical treatment plan. - furosemide (  LASIX) 40 MG tablet; Take 1 tablet (40 mg total) by mouth daily.  Dispense: 90 tablet; Refill: [place], PA-C  [facility]

## 2018-08-26 ENCOUNTER — Ambulatory Visit (INDEPENDENT_AMBULATORY_CARE_PROVIDER_SITE_OTHER): Payer: Medicare HMO | Admitting: Vascular Surgery

## 2018-08-31 ENCOUNTER — Other Ambulatory Visit: Payer: Self-pay | Admitting: Physician Assistant

## 2018-08-31 DIAGNOSIS — M25552 Pain in left hip: Secondary | ICD-10-CM

## 2018-08-31 DIAGNOSIS — M25551 Pain in right hip: Secondary | ICD-10-CM

## 2018-08-31 DIAGNOSIS — M25511 Pain in right shoulder: Secondary | ICD-10-CM

## 2018-08-31 DIAGNOSIS — M25512 Pain in left shoulder: Principal | ICD-10-CM

## 2018-09-03 ENCOUNTER — Encounter (INDEPENDENT_AMBULATORY_CARE_PROVIDER_SITE_OTHER): Payer: Self-pay | Admitting: Vascular Surgery

## 2018-09-03 ENCOUNTER — Ambulatory Visit (INDEPENDENT_AMBULATORY_CARE_PROVIDER_SITE_OTHER): Payer: Medicare HMO | Admitting: Vascular Surgery

## 2018-09-03 ENCOUNTER — Encounter (INDEPENDENT_AMBULATORY_CARE_PROVIDER_SITE_OTHER): Payer: Medicare HMO

## 2018-09-03 ENCOUNTER — Ambulatory Visit (INDEPENDENT_AMBULATORY_CARE_PROVIDER_SITE_OTHER): Payer: Medicare HMO

## 2018-09-03 ENCOUNTER — Other Ambulatory Visit (INDEPENDENT_AMBULATORY_CARE_PROVIDER_SITE_OTHER): Payer: Self-pay | Admitting: Vascular Surgery

## 2018-09-03 VITALS — BP 141/88 | HR 85 | Resp 18 | Ht 67.0 in | Wt 315.6 lb

## 2018-09-03 DIAGNOSIS — I890 Lymphedema, not elsewhere classified: Secondary | ICD-10-CM

## 2018-09-03 DIAGNOSIS — R600 Localized edema: Secondary | ICD-10-CM

## 2018-09-03 DIAGNOSIS — I10 Essential (primary) hypertension: Secondary | ICD-10-CM

## 2018-09-03 NOTE — Progress Notes (Signed)
Subjective:    Patient ID: Barbara Anderson, female    DOB: [birth year], 48 y.o.   MRN: [id number] Chief Complaint  Patient presents with  . New Patient (Initial Visit)   Presents as a new patient referred to our practice by the [facility] emergency department for "bilateral lower extremity edema".  Patient notes a longstanding history of swelling to the bilateral legs.  The patient notes that over the last "few years" her swelling has worsened.  The patient was referred to the [facility] emergency department by her primary care physician assistant to rule out a possible cardiac factor.  Looks like an x-ray was completed.  Patient notes a history of congestive heart failure in her family.  Patient notes that her progressively worsening edema to the bilateral legs have become lifestyle limiting.  The patient notes that her edema is associated with discomfort.  Her symptoms worsen with sitting and standing for long periods of time.  The patient denies any recent surgery or trauma to the bilateral legs.  At this time, patient does not engage in conservative therapy.  The patient feels that her symptoms have progressed to the point that she is unable to function on daily basis.  The patient underwent a bilateral lower extremity venous duplex which was notable for no venous disease, no DVT and no SVT to the bilateral legs.  Patient denies any recent or recurrent bouts of cellulitis.  Patient denies any claudication-like symptoms, rest pain or ulcer formation to the bilateral legs.  Patient denies any fever, nausea vomiting.  Review of Systems  Constitutional: Negative.   HENT: Negative.   Eyes: Negative.   Respiratory: Negative.   Cardiovascular: Positive for leg swelling.  Gastrointestinal: Negative.   Endocrine: Negative.   Genitourinary: Negative.   Musculoskeletal: Negative.   Skin: Negative.   Allergic/Immunologic: Negative.   Neurological: Negative.    Hematological: Negative.   Psychiatric/Behavioral: Negative.       Objective:   Physical Exam Vitals signs reviewed.  Constitutional:      Appearance: Normal appearance. She is obese.  HENT:     Head: Normocephalic and atraumatic.     Right Ear: External ear normal.     Left Ear: External ear normal.     Nose: Nose normal.     Mouth/Throat:     Mouth: Mucous membranes are moist.     Pharynx: Oropharynx is clear.  Eyes:     Extraocular Movements: Extraocular movements intact.     Conjunctiva/sclera: Conjunctivae normal.     Pupils: Pupils are equal, round, and reactive to light.  Neck:     Musculoskeletal: Normal range of motion.  Cardiovascular:     Rate and Rhythm: Normal rate and regular rhythm.     Comments: Unable to pedal pulses due to body habitus and edema however the bilateral feet are warm Pulmonary:     Effort: Pulmonary effort is normal.     Breath sounds: Normal breath sounds.  Musculoskeletal: Normal range of motion.        General: Swelling (Moderate 2+ pitting edema noted bilaterally) present.  Skin:    General: Skin is warm and dry.     Comments: There is no stasis dermatitis, fibrosis or cellulitis noted to the bilateral legs.  There is no active ulcerations noted at this time.  Neurological:     General: No focal deficit present.     Mental Status: She is alert and oriented to person, place, and  time. Mental status is at baseline.  Psychiatric:        Mood and Affect: Mood normal.        Behavior: Behavior normal.        Thought Content: Thought content normal.        Judgment: Judgment normal.    BP (!) 141/88 (BP Location: Right Arm, Patient Position: Sitting)   Pulse 85   Resp 18   Ht '5\' 7"'$  (1.702 m)   Wt (!) 315 lb 9.6 oz (143.2 kg)   BMI 49.43 kg/m   Past Medical History:  Diagnosis Date  . ADD (attention deficit disorder)   . Anxiety   . Back pain   . Concussion 2001   head trauma-work related. Caused headaches and short term memory  loss.  . Depression   . Diabetes mellitus without complication ([place])   . Overactive bladder   . Recurrent vaginitis    Social History   Socioeconomic History  . Marital status: Single    Spouse name: Not on file  . Number of children: Not on file  . Years of education: Not on file  . Highest education level: Not on file  Occupational History  . Not on file  Social Needs  . Financial resource strain: Not on file  . Food insecurity:    Worry: Not on file    Inability: Not on file  . Transportation needs:    Medical: Not on file    Non-medical: Not on file  Tobacco Use  . Smoking status: Never Smoker  . Smokeless tobacco: Never Used  Substance and Sexual Activity  . Alcohol use: No    Alcohol/week: 0.0 standard drinks  . Drug use: No  . Sexual activity: Yes    Birth control/protection: Condom  Lifestyle  . Physical activity:    Days per week: Not on file    Minutes per session: Not on file  . Stress: Not on file  Relationships  . Social connections:    Talks on phone: Not on file    Gets together: Not on file    Attends religious service: Not on file    Active member of club or organization: Not on file    Attends meetings of clubs or organizations: Not on file    Relationship status: Not on file  . Intimate partner violence:    Fear of current or ex partner: Not on file    Emotionally abused: Not on file    Physically abused: Not on file    Forced sexual activity: Not on file  Other Topics Concern  . Not on file  Social History Narrative  . Not on file   Past Surgical History:  Procedure Laterality Date  . ankle pin and plates placement     right  . COSMETIC SURGERY  2005   BREASTS, ARM, ABDOMEN AFTER WEIGHT LOSS   Family History  Problem Relation Age of Onset  . Diabetes Mother   . Hypertension Mother   . Depression Mother   . Diabetes Father   . Hypertension Father   . Anxiety disorder Father   . Depression Father   . ADD / ADHD Father   .  Depression Sister   . Bipolar disorder Sister   . ADD / ADHD Brother    No Known Allergies     Assessment & Plan:  Presents as a new patient referred to our practice by the [facility] emergency department for "bilateral lower  extremity edema".  Patient notes a longstanding history of swelling to the bilateral legs.  The patient notes that over the last "few years" her swelling has worsened.  The patient was referred to the [facility] emergency department by her primary care physician assistant to rule out a possible cardiac factor.  Looks like an x-ray was completed.  Patient notes a history of congestive heart failure in her family.  Patient notes that her progressively worsening edema to the bilateral legs have become lifestyle limiting.  The patient notes that her edema is associated with discomfort.  Her symptoms worsen with sitting and standing for long periods of time.  The patient denies any recent surgery or trauma to the bilateral legs.  At this time, patient does not engage in conservative therapy.  The patient feels that her symptoms have progressed to the point that she is unable to function on daily basis.  The patient underwent a bilateral lower extremity venous duplex which was notable for no venous disease, no DVT and no SVT to the bilateral legs.  Patient denies any recent or recurrent bouts of cellulitis.  Patient denies any claudication-like symptoms, rest pain or ulcer formation to the bilateral legs.  Patient denies any fever, nausea vomiting.  1. Lymphedema - New In an attempt to gain control of the patient's lower extremity edema I recommend three layer zinc oxide unna wraps placed to the bilateral legs changed on a weekly basis for one month. Bilateral three layer unna boots were placed to the bilateral legs today. We had a long discussion about proper elevation as heart level higher as much as possible - this includes during the  day. We also discussed increasing her activity and trying to lose weight. Patient to follow-up in one month so I can assess her progress with unna boot therapy. If unna boot therapy is successful I will plan on transitioning the patient to the medical grade one compression socks in one month. The patient was given information on compression socks and a lymphedema pump today (case conservative therapy fails). The patient was instructed to call the office in the interim if any worsening edema or ulcerations to the legs, feet or toes occurs. The patient expresses their understanding.  2. Morbid obesity ([place]) - Stable Contributing factor to the patient's bilateral lower extremity edema. Encouraged weight loss.  3. Essential hypertension - Stable Encouraged good control as its slows the progression of atherosclerotic disease  Current Outpatient Medications on File Prior to Visit  Medication Sig Dispense Refill  . ACCU-CHEK SOFTCLIX LANCETS lancets Use as instructed 100 each 12  . albuterol (PROVENTIL HFA;VENTOLIN HFA) 108 (90 Base) MCG/ACT inhaler Inhale 2 puffs into the lungs every 4 (four) hours as needed for wheezing or shortness of breath. 1 Inhaler 0  . Alcohol Swabs (B-D SINGLE USE SWABS REGULAR) PADS USE TO CLEANSE SKIN DAILY BEFORE CHECKING BLOOD SUGAR 100 each 1  . amoxicillin-clavulanate (AUGMENTIN) 875-125 MG tablet Take 1 tablet by mouth 2 (two) times daily. 20 tablet 0  . atorvastatin (LIPITOR) 10 MG tablet Take 1 tablet (10 mg total) by mouth daily. 90 tablet 3  . Blood Glucose Monitoring Suppl (ACCU-CHEK AVIVA PLUS) w/Device KIT To check blood sugar once daily 1 kit 0  . clotrimazole (LOTRIMIN) 1 % cream Apply 1 application topically 2 (two) times daily. 30 g 0  . DULoxetine (CYMBALTA) 60 MG capsule Take 2 capsules (120 mg total) by mouth daily. 60 capsule 0  . fluticasone (FLONASE)  50 MCG/ACT nasal spray Place 2 sprays into both nostrils daily. 16 g 6  . folic acid (FOLVITE) 1 MG  tablet Take 1 tablet (1 mg total) daily by mouth. 90 tablet 1  . furosemide (LASIX) 40 MG tablet Take 1 tablet (40 mg total) by mouth daily. 90 tablet 3  . glucose blood (ACCU-CHEK AVIVA PLUS) test strip To check blood sugar once daily 100 each 12  . hydrOXYzine (ATARAX/VISTARIL) 50 MG tablet Take 1 tablet (50 mg total) by mouth 3 (three) times daily as needed for anxiety. 90 tablet 1  . Lancets Misc. (ACCU-CHEK FASTCLIX LANCET) KIT To check blood sugar once daily 1 kit 0  . levothyroxine (SYNTHROID, LEVOTHROID) 25 MCG tablet Take 1 tablet (25 mcg total) by mouth daily before breakfast. 90 tablet 0  . lisinopril (PRINIVIL,ZESTRIL) 10 MG tablet Take 1 tablet (10 mg total) by mouth daily. 90 tablet 3  . meloxicam (MOBIC) 15 MG tablet TAKE 1 TABLET BY MOUTH ONCE DAILY 30 tablet 5  . metFORMIN (GLUCOPHAGE) 500 MG tablet Take 1 tablet (500 mg total) by mouth 2 (two) times daily with a meal. 180 tablet 0  . Potassium Chloride ER 20 MEQ TBCR Take 1 tablet by mouth daily. 90 tablet 1  . pregabalin (LYRICA) 200 MG capsule Take 1 capsule (200 mg total) by mouth 2 (two) times daily. 180 capsule 1  . risperiDONE (RISPERDAL) 1 MG tablet Take 1 tablet (1 mg total) by mouth at bedtime. 30 tablet 1  . topiramate (TOPAMAX) 200 MG tablet Take 1 tablet (200 mg total) by mouth at bedtime. 30 tablet 1  . traZODone (DESYREL) 150 MG tablet Take 1 tablet (150 mg total) by mouth at bedtime. 30 tablet 1  . Vitamin D, Ergocalciferol, (DRISDOL) 1.25 MG (50000 UT) CAPS capsule TAKE 1 CAPSULE EVERY 7 DAYS 12 capsule 0   No current facility-administered medications on file prior to visit.    There are no Patient Instructions on file for this visit. No follow-ups on file.  Destanie Tibbetts A Areonna Bran, PA-C

## 2018-09-09 DIAGNOSIS — H524 Presbyopia: Secondary | ICD-10-CM | POA: Diagnosis not present

## 2018-09-09 DIAGNOSIS — H31003 Unspecified chorioretinal scars, bilateral: Secondary | ICD-10-CM | POA: Diagnosis not present

## 2018-09-10 ENCOUNTER — Ambulatory Visit (INDEPENDENT_AMBULATORY_CARE_PROVIDER_SITE_OTHER): Payer: Medicare HMO | Admitting: Nurse Practitioner

## 2018-09-10 ENCOUNTER — Encounter (INDEPENDENT_AMBULATORY_CARE_PROVIDER_SITE_OTHER): Payer: Self-pay

## 2018-09-10 VITALS — BP 135/71 | HR 114 | Resp 14 | Ht 67.0 in | Wt 312.0 lb

## 2018-09-10 DIAGNOSIS — I890 Lymphedema, not elsewhere classified: Secondary | ICD-10-CM | POA: Diagnosis not present

## 2018-09-10 NOTE — Progress Notes (Signed)
History of Present Illness  There is no documented history at this time  Assessments & Plan   There are no diagnoses linked to this encounter.    Additional instructions  Subjective:  Patient presents with venous ulcer of the Bilateral lower extremity.    Procedure:  3 layer unna wrap was placed Bilateral lower extremity.   Plan:   Follow up in one week.  

## 2018-09-17 ENCOUNTER — Encounter (INDEPENDENT_AMBULATORY_CARE_PROVIDER_SITE_OTHER): Payer: Self-pay

## 2018-09-17 ENCOUNTER — Ambulatory Visit (INDEPENDENT_AMBULATORY_CARE_PROVIDER_SITE_OTHER): Payer: Medicare HMO | Admitting: Vascular Surgery

## 2018-09-17 VITALS — BP 146/87 | HR 88 | Resp 16 | Ht 67.0 in | Wt 305.0 lb

## 2018-09-17 DIAGNOSIS — I890 Lymphedema, not elsewhere classified: Secondary | ICD-10-CM

## 2018-09-17 NOTE — Progress Notes (Signed)
History of Present Illness  There is no documented history at this time  Assessments & Plan   There are no diagnoses linked to this encounter.    Additional instructions  Subjective:  Patient presents with venous ulcer of the Bilateral lower extremity.    Procedure:  3 layer unna wrap was placed Bilateral lower extremity.   Plan:   Follow up in one week.  

## 2018-09-21 ENCOUNTER — Encounter (INDEPENDENT_AMBULATORY_CARE_PROVIDER_SITE_OTHER): Payer: Self-pay | Admitting: Nurse Practitioner

## 2018-09-22 ENCOUNTER — Encounter (INDEPENDENT_AMBULATORY_CARE_PROVIDER_SITE_OTHER): Payer: Self-pay | Admitting: Vascular Surgery

## 2018-09-22 ENCOUNTER — Ambulatory Visit (INDEPENDENT_AMBULATORY_CARE_PROVIDER_SITE_OTHER): Payer: Medicare HMO | Admitting: Vascular Surgery

## 2018-09-22 VITALS — BP 147/92 | HR 109 | Resp 16 | Ht 67.0 in | Wt 304.0 lb

## 2018-09-22 DIAGNOSIS — E1129 Type 2 diabetes mellitus with other diabetic kidney complication: Secondary | ICD-10-CM

## 2018-09-22 DIAGNOSIS — I10 Essential (primary) hypertension: Secondary | ICD-10-CM | POA: Diagnosis not present

## 2018-09-22 DIAGNOSIS — I890 Lymphedema, not elsewhere classified: Secondary | ICD-10-CM

## 2018-09-22 DIAGNOSIS — R809 Proteinuria, unspecified: Secondary | ICD-10-CM

## 2018-09-22 NOTE — Progress Notes (Signed)
MRN : [id number]  Barbara Anderson is a 48 y.o. (11-19-1970) female who presents with chief complaint of  Chief Complaint  Patient presents with  . Follow-up    unna check  .  History of Present Illness:  The patient returns to the office for followup evaluation regarding leg swelling.  The swelling has persisted and the pain associated with swelling continues. There have not been any interval development of a ulcerations or wounds.  Since the previous visit the patient has been wearing graduated compression stockings and has noted little if any improvement in the lymphedema. The patient has been using compression routinely morning until night.  The patient also states elevation during the day and exercise is being done too.   Current Meds  Medication Sig  . ACCU-CHEK SOFTCLIX LANCETS lancets Use as instructed  . albuterol (PROVENTIL HFA;VENTOLIN HFA) 108 (90 Base) MCG/ACT inhaler Inhale 2 puffs into the lungs every 4 (four) hours as needed for wheezing or shortness of breath.  . Alcohol Swabs (B-D SINGLE USE SWABS REGULAR) PADS USE TO CLEANSE SKIN DAILY BEFORE CHECKING BLOOD SUGAR  . amoxicillin-clavulanate (AUGMENTIN) 875-125 MG tablet Take 1 tablet by mouth 2 (two) times daily.  Marland Kitchen atorvastatin (LIPITOR) 10 MG tablet Take 1 tablet (10 mg total) by mouth daily.  . Blood Glucose Monitoring Suppl (ACCU-CHEK AVIVA PLUS) w/Device KIT To check blood sugar once daily  . clotrimazole (LOTRIMIN) 1 % cream Apply 1 application topically 2 (two) times daily.  . DULoxetine (CYMBALTA) 60 MG capsule Take 2 capsules (120 mg total) by mouth daily.  . fluticasone (FLONASE) 50 MCG/ACT nasal spray Place 2 sprays into both nostrils daily.  . folic acid (FOLVITE) 1 MG tablet Take 1 tablet (1 mg total) daily by mouth.  . furosemide (LASIX) 40 MG tablet Take 1 tablet (40 mg total) by mouth daily.  Marland Kitchen glucose blood (ACCU-CHEK AVIVA PLUS) test strip To check blood sugar once daily  . hydrOXYzine  (ATARAX/VISTARIL) 50 MG tablet Take 1 tablet (50 mg total) by mouth 3 (three) times daily as needed for anxiety.  . Lancets Misc. (ACCU-CHEK FASTCLIX LANCET) KIT To check blood sugar once daily  . levothyroxine (SYNTHROID, LEVOTHROID) 25 MCG tablet Take 1 tablet (25 mcg total) by mouth daily before breakfast.  . lisinopril (PRINIVIL,ZESTRIL) 10 MG tablet Take 1 tablet (10 mg total) by mouth daily.  . meloxicam (MOBIC) 15 MG tablet TAKE 1 TABLET BY MOUTH ONCE DAILY  . metFORMIN (GLUCOPHAGE) 500 MG tablet Take 1 tablet (500 mg total) by mouth 2 (two) times daily with a meal.  . Potassium Chloride ER 20 MEQ TBCR Take 1 tablet by mouth daily.  . pregabalin (LYRICA) 200 MG capsule Take 1 capsule (200 mg total) by mouth 2 (two) times daily.  . risperiDONE (RISPERDAL) 1 MG tablet Take 1 tablet (1 mg total) by mouth at bedtime.  . topiramate (TOPAMAX) 200 MG tablet Take 1 tablet (200 mg total) by mouth at bedtime.  . traZODone (DESYREL) 150 MG tablet Take 1 tablet (150 mg total) by mouth at bedtime.  . Vitamin D, Ergocalciferol, (DRISDOL) 1.25 MG (50000 UT) CAPS capsule TAKE 1 CAPSULE EVERY 7 DAYS    Past Medical History:  Diagnosis Date  . ADD (attention deficit disorder)   . Anxiety   . Back pain   . Concussion 2001   head trauma-work related. Caused headaches and short term memory loss.  . Depression   . Diabetes mellitus without complication ([place])   .  Overactive bladder   . Recurrent vaginitis     Past Surgical History:  Procedure Laterality Date  . ankle pin and plates placement     right  . COSMETIC SURGERY  2005   BREASTS, ARM, ABDOMEN AFTER WEIGHT LOSS    Social History Social History   Tobacco Use  . Smoking status: Never Smoker  . Smokeless tobacco: Never Used  Substance Use Topics  . Alcohol use: No    Alcohol/week: 0.0 standard drinks  . Drug use: No    Family History Family History  Problem Relation Age of Onset  . Diabetes Mother   . Hypertension Mother   .  Depression Mother   . Diabetes Father   . Hypertension Father   . Anxiety disorder Father   . Depression Father   . ADD / ADHD Father   . Depression Sister   . Bipolar disorder Sister   . ADD / ADHD Brother     No Known Allergies   REVIEW OF SYSTEMS (Negative unless checked)  Constitutional: '[]' Weight loss  '[]' Fever  '[]' Chills Cardiac: '[]' Chest pain   '[]' Chest pressure   '[]' Palpitations   '[]' Shortness of breath when laying flat   '[]' Shortness of breath with exertion. Vascular:  '[]' Pain in legs with walking   '[]' Pain in legs at rest  '[]' History of DVT   '[]' Phlebitis   '[x]' Swelling in legs   '[]' Varicose veins   '[]' Non-healing ulcers Pulmonary:   '[]' Uses home oxygen   '[]' Productive cough   '[]' Hemoptysis   '[]' Wheeze  '[]' COPD   '[]' Asthma Neurologic:  '[]' Dizziness   '[]' Seizures   '[]' History of stroke   '[]' History of TIA  '[]' Aphasia   '[]' Vissual changes   '[]' Weakness or numbness in arm   '[]' Weakness or numbness in leg Musculoskeletal:   '[]' Joint swelling   '[]' Joint pain   '[]' Low back pain Hematologic:  '[]' Easy bruising  '[]' Easy bleeding   '[]' Hypercoagulable state   '[]' Anemic Gastrointestinal:  '[]' Diarrhea   '[]' Vomiting  '[]' Gastroesophageal reflux/heartburn   '[]' Difficulty swallowing. Genitourinary:  '[]' Chronic kidney disease   '[]' Difficult urination  '[]' Frequent urination   '[]' Blood in urine Skin:  '[]' Rashes   '[]' Ulcers  Psychological:  '[]' History of anxiety   '[]'  History of major depression.  Physical Examination  Vitals:   09/22/18 1419  BP: (!) 147/92  Pulse: (!) 109  Resp: 16  Weight: (!) 304 lb (137.9 kg)  Height: '5\' 7"'  (1.702 m)   Body mass index is 47.61 kg/m. Gen: WD/WN, NAD Head: [place]/AT, No temporalis wasting.  Ear/Nose/Throat: Hearing grossly intact, nares w/o erythema or drainage Eyes: PER, EOMI, sclera nonicteric.  Neck: Supple, no large masses.   Pulmonary:  Good air movement, no audible wheezing bilaterally, no use of accessory muscles.  Cardiac: RRR, no JVD Vascular: 3+ lymphedema bilaterally Vessel Right  Left  Radial Palpable Palpable  Gastrointestinal: Non-distended. No guarding/no peritoneal signs.  Musculoskeletal: M/S 5/5 throughout.  No deformity or atrophy.  Neurologic: CN 2-12 intact. Symmetrical.  Speech is fluent. Motor exam as listed above. Psychiatric: Judgment intact, Mood & affect appropriate for pt's clinical situation. Dermatologic: very mild venous rashes no ulcers noted.  No changes consistent with cellulitis. Lymph : No lichenification or skin changes of chronic lymphedema.  CBC Lab Results  Component Value Date   WBC 8.7 08/15/2018   HGB 10.9 (L) 08/15/2018   HCT 34.7 (L) 08/15/2018   MCV 89.7 08/15/2018   PLT 170 08/15/2018    BMET    Component Value Date/Time   NA 138 08/15/2018 2114  NA 139 07/10/2018 0918   NA 144 05/12/2014 1947   K 4.0 08/15/2018 2114   K 3.0 (L) 05/12/2014 1947   CL 106 08/15/2018 2114   CL 109 (H) 05/12/2014 1947   CO2 24 08/15/2018 2114   CO2 28 05/12/2014 1947   GLUCOSE 166 (H) 08/15/2018 2114   GLUCOSE 101 (H) 05/12/2014 1947   BUN 23 (H) 08/15/2018 2114   BUN 10 07/10/2018 0918   BUN 8 05/12/2014 1947   CREATININE 1.21 (H) 08/15/2018 2114   CREATININE 0.88 05/12/2014 1947   CREATININE 0.79 04/22/2014 1503   CALCIUM 9.3 08/15/2018 2114   CALCIUM 8.1 (L) 05/12/2014 1947   GFRNONAA 52 (L) 08/15/2018 2114   GFRNONAA >60 10/21/2013 2028   GFRAA >60 08/15/2018 2114   GFRAA >60 10/21/2013 2028   CrCl cannot be calculated (Patient's most recent lab result is older than the maximum 21 days allowed.).  COAG No results found for: INR, PROTIME  Radiology Vas [place] Lower Extremity Venous Reflux  Result Date: 09/04/2018  Lower Venous Reflux Study Indications: Swelling.  Performing Technologist: Almira Coaster RVS  Examination Guidelines: A complete evaluation includes B-mode imaging, spectral Doppler, color Doppler, and power Doppler as needed of all accessible portions of each vessel. Bilateral testing is considered an integral  part of a complete examination. Limited examinations for reoccurring indications may be performed as noted. The reflux portion of the exam is performed with the patient in reverse Trendelenburg.  Right Venous Findings: +---------+---------------+---------+-----------+----------+-------+          CompressibilityPhasicitySpontaneityPropertiesSummary +---------+---------------+---------+-----------+----------+-------+ CFV      Full           Yes      Yes                          +---------+---------------+---------+-----------+----------+-------+ SFJ      Full           Yes      Yes                          +---------+---------------+---------+-----------+----------+-------+ FV Prox  Full           Yes      Yes                          +---------+---------------+---------+-----------+----------+-------+ FV Mid   Full           Yes      Yes                          +---------+---------------+---------+-----------+----------+-------+ FV DistalFull           Yes      Yes                          +---------+---------------+---------+-----------+----------+-------+ POP      Full           Yes      Yes                          +---------+---------------+---------+-----------+----------+-------+ GSV      Full           Yes      Yes                          +---------+---------------+---------+-----------+----------+-------+  SSV      Full           Yes      Yes                          +---------+---------------+---------+-----------+----------+-------+  Left Venous Findings: +---------+---------------+---------+-----------+----------+-------+          CompressibilityPhasicitySpontaneityPropertiesSummary +---------+---------------+---------+-----------+----------+-------+ CFV      Full           Yes      Yes                          +---------+---------------+---------+-----------+----------+-------+ SFJ      Full           Yes      Yes                           +---------+---------------+---------+-----------+----------+-------+ FV Prox  Full           Yes      Yes                          +---------+---------------+---------+-----------+----------+-------+ FV Mid   Full           Yes      Yes                          +---------+---------------+---------+-----------+----------+-------+ FV DistalFull           Yes      Yes                          +---------+---------------+---------+-----------+----------+-------+ POP      Full           Yes      Yes                          +---------+---------------+---------+-----------+----------+-------+ GSV      Full           Yes      Yes                          +---------+---------------+---------+-----------+----------+-------+ SSV      Full           Yes      Yes                          +---------+---------------+---------+-----------+----------+-------+  Vein Diameters: +------------------------------+----------+---------+                               Right (cm)Left (cm) +------------------------------+----------+---------+ GSV at Saphenofemoral junction.86       .82       +------------------------------+----------+---------+ GSV at prox thigh             .74       .66       +------------------------------+----------+---------+ GSV at mid thigh              .63       .44       +------------------------------+----------+---------+ GSV at distal thigh           .56       .39       +------------------------------+----------+---------+  GSV at knee                   .38       .38       +------------------------------+----------+---------+ GSV prox calf                 .37                 +------------------------------+----------+---------+ SSV origin                    .28       .28       +------------------------------+----------+---------+   Summary: Right: There is no evidence of deep vein thrombosis in the lower extremity.There is no  evidence of chronic venous insufficiency.There is no evidence of superficial venous thrombosis. Left: There is no evidence of deep vein thrombosis in the lower extremity.There is no evidence of chronic venous insufficiency.There is no evidence of superficial venous thrombosis.  *See table(s) above for measurements and observations. Electronically signed by Hortencia Pilar MD on 09/04/2018 at 2:33:20 PM.    Final      Assessment/Plan 1. Lymphedema Recommend:  No surgery or intervention at this point in time.    I have reviewed my previous discussion with the patient regarding swelling and why it causes symptoms.  Patient will continue wearing graduated compression stockings class 1 (20-30 mmHg) on a daily basis. The patient will  beginning wearing the stockings first thing in the morning and removing them in the evening. The patient is instructed specifically not to sleep in the stockings.    In addition, behavioral modification including several periods of elevation of the lower extremities during the day will be continued.  This was reviewed with the patient during the initial visit.  The patient will also continue routine exercise, especially walking on a daily basis as was discussed during the initial visit.    Despite conservative treatments including graduated compression therapy class 1 and behavioral modification including exercise and elevation the patient  has not obtained adequate control of the lymphedema.  The patient still has stage 3 lymphedema and therefore, I believe that a lymph pump should be added to improve the control of the patient's lymphedema.  Additionally, a lymph pump is warranted because it will reduce the risk of cellulitis and ulceration in the future.  Patient should follow-up in six months    2. Essential hypertension Continue antihypertensive medications as already ordered, these medications have been reviewed and there are no changes at this time.   3. Type 2  diabetes mellitus with microalbuminuria, without long-term current use of insulin (HCC) Continue hypoglycemic medications as already ordered, these medications have been reviewed and there are no changes at this time.  Hgb A1C to be monitored as already arranged by primary service    Hortencia Pilar, MD  09/22/2018 2:23 PM

## 2018-09-26 ENCOUNTER — Ambulatory Visit (INDEPENDENT_AMBULATORY_CARE_PROVIDER_SITE_OTHER): Payer: Medicare HMO | Admitting: Physician Assistant

## 2018-09-26 ENCOUNTER — Encounter: Payer: Self-pay | Admitting: Physician Assistant

## 2018-09-26 VITALS — BP 157/101 | HR 78 | Temp 98.1°F | Resp 16 | Wt 302.2 lb

## 2018-09-26 DIAGNOSIS — E034 Atrophy of thyroid (acquired): Secondary | ICD-10-CM | POA: Diagnosis not present

## 2018-09-26 DIAGNOSIS — I10 Essential (primary) hypertension: Secondary | ICD-10-CM

## 2018-09-26 DIAGNOSIS — I890 Lymphedema, not elsewhere classified: Secondary | ICD-10-CM

## 2018-09-26 MED ORDER — LISINOPRIL 20 MG PO TABS: 20.0000 mg | ORAL_TABLET | Freq: Every day | ORAL | 3 refills | Status: DC

## 2018-09-26 NOTE — Progress Notes (Signed)
Patient: Barbara Anderson Female    DOB: November 19, 1970   48 y.o.   MRN: [id number] Visit Date: 09/26/2018  Today's Provider: Mar Daring, PA-C   Chief Complaint  Patient presents with  . Follow-up    Thyroid   Subjective:     HPI  Patient here today to follow-up on her Thyroid.Her ER labs showed she had an underactive thyroid indicating hypothyroidism on 01/18. Levothyroxine was sent in for her. She reports that she is taking her Thyroid medicines and reports that she still feels fatigue. She reports that the swelling of legs hurting have improved with the UNNA boots.  She will get the [facility] boots taken off next week and will start compression stockings (reports she is going to [place] this weekend to get them) and the lymphedema pumps.   No Known Allergies   Current Outpatient Medications:  .  ACCU-CHEK SOFTCLIX LANCETS lancets, Use as instructed, Disp: 100 each, Rfl: 12 .  albuterol (PROVENTIL HFA;VENTOLIN HFA) 108 (90 Base) MCG/ACT inhaler, Inhale 2 puffs into the lungs every 4 (four) hours as needed for wheezing or shortness of breath., Disp: 1 Inhaler, Rfl: 0 .  Alcohol Swabs (B-D SINGLE USE SWABS REGULAR) PADS, USE TO CLEANSE SKIN DAILY BEFORE CHECKING BLOOD SUGAR, Disp: 100 each, Rfl: 1 .  atorvastatin (LIPITOR) 10 MG tablet, Take 1 tablet (10 mg total) by mouth daily., Disp: 90 tablet, Rfl: 3 .  Blood Glucose Monitoring Suppl (ACCU-CHEK AVIVA PLUS) w/Device KIT, To check blood sugar once daily, Disp: 1 kit, Rfl: 0 .  clotrimazole (LOTRIMIN) 1 % cream, Apply 1 application topically 2 (two) times daily., Disp: 30 g, Rfl: 0 .  DULoxetine (CYMBALTA) 60 MG capsule, Take 2 capsules (120 mg total) by mouth daily., Disp: 60 capsule, Rfl: 0 .  fluticasone (FLONASE) 50 MCG/ACT nasal spray, Place 2 sprays into both nostrils daily., Disp: 16 g, Rfl: 6 .  folic acid (FOLVITE) 1 MG tablet, Take 1 tablet (1 mg total) daily by mouth., Disp: 90 tablet, Rfl: 1 .  furosemide (LASIX) 40  MG tablet, Take 1 tablet (40 mg total) by mouth daily., Disp: 90 tablet, Rfl: 3 .  glucose blood (ACCU-CHEK AVIVA PLUS) test strip, To check blood sugar once daily, Disp: 100 each, Rfl: 12 .  hydrOXYzine (ATARAX/VISTARIL) 50 MG tablet, Take 1 tablet (50 mg total) by mouth 3 (three) times daily as needed for anxiety., Disp: 90 tablet, Rfl: 1 .  Lancets Misc. (ACCU-CHEK FASTCLIX LANCET) KIT, To check blood sugar once daily, Disp: 1 kit, Rfl: 0 .  levothyroxine (SYNTHROID, LEVOTHROID) 25 MCG tablet, Take 1 tablet (25 mcg total) by mouth daily before breakfast., Disp: 90 tablet, Rfl: 0 .  lisinopril (PRINIVIL,ZESTRIL) 10 MG tablet, Take 1 tablet (10 mg total) by mouth daily., Disp: 90 tablet, Rfl: 3 .  meloxicam (MOBIC) 15 MG tablet, TAKE 1 TABLET BY MOUTH ONCE DAILY, Disp: 30 tablet, Rfl: 5 .  metFORMIN (GLUCOPHAGE) 500 MG tablet, Take 1 tablet (500 mg total) by mouth 2 (two) times daily with a meal., Disp: 180 tablet, Rfl: 0 .  Potassium Chloride ER 20 MEQ TBCR, Take 1 tablet by mouth daily., Disp: 90 tablet, Rfl: 1 .  pregabalin (LYRICA) 200 MG capsule, Take 1 capsule (200 mg total) by mouth 2 (two) times daily., Disp: 180 capsule, Rfl: 1 .  risperiDONE (RISPERDAL) 1 MG tablet, Take 1 tablet (1 mg total) by mouth at bedtime., Disp: 30 tablet, Rfl: 1 .  topiramate (  TOPAMAX) 200 MG tablet, Take 1 tablet (200 mg total) by mouth at bedtime., Disp: 30 tablet, Rfl: 1 .  traZODone (DESYREL) 150 MG tablet, Take 1 tablet (150 mg total) by mouth at bedtime., Disp: 30 tablet, Rfl: 1 .  Vitamin D, Ergocalciferol, (DRISDOL) 1.25 MG (50000 UT) CAPS capsule, TAKE 1 CAPSULE EVERY 7 DAYS, Disp: 12 capsule, Rfl: 0  Review of Systems  Constitutional: Positive for fatigue.  HENT: Negative.   Eyes: Negative for visual disturbance.  Respiratory: Positive for shortness of breath.   Cardiovascular: Positive for leg swelling ("a  little" she wraps them and by wrapping them it feels better). Negative for chest pain and  palpitations.  Musculoskeletal: Negative.   Neurological: Negative.     Social History   Tobacco Use  . Smoking status: Never Smoker  . Smokeless tobacco: Never Used  Substance Use Topics  . Alcohol use: No    Alcohol/week: 0.0 standard drinks      Objective:   BP (!) 157/101 (BP Location: Left Arm, Patient Position: Sitting, Cuff Size: Large)   Pulse 78   Temp 98.1 F (36.7 C) (Oral)   Resp 16   Wt (!) 302 lb 3.2 oz (137.1 kg)   BMI 47.33 kg/m  Vitals:   09/26/18 1555  BP: (!) 157/101  Pulse: 78  Resp: 16  Temp: 98.1 F (36.7 C)  TempSrc: Oral  Weight: (!) 302 lb 3.2 oz (137.1 kg)     Physical Exam Vitals signs reviewed.  Constitutional:      General: She is not in acute distress.    Appearance: She is well-developed. She is not diaphoretic.  Neck:     Musculoskeletal: Normal range of motion and neck supple.  Cardiovascular:     Rate and Rhythm: Normal rate and regular rhythm.     Heart sounds: Normal heart sounds. No murmur. No friction rub. No gallop.   Pulmonary:     Effort: Pulmonary effort is normal. No respiratory distress.     Breath sounds: Normal breath sounds. No wheezing or rales.        Assessment & Plan    1. Hypothyroidism due to acquired atrophy of thyroid Will check labs as below and f/u pending results. Continue levothyroxine 52mg daily.  - TSH + free T4  2. Essential hypertension Elevated today. Increase dose to 233mof lisinopril from 1051m - lisinopril (PRINIVIL,ZESTRIL) 20 MG tablet; Take 1 tablet (20 mg total) by mouth daily.  Dispense: 90 tablet; Refill: 3  3. Lymphedema Improving. Followed by Vascular.      JenMar DaringA-C  [facility]

## 2018-09-27 LAB — TSH+FREE T4
Free T4: 1 ng/dL (ref 0.82–1.77)
TSH: 2.36 u[IU]/mL (ref 0.450–4.500)

## 2018-09-28 ENCOUNTER — Encounter (INDEPENDENT_AMBULATORY_CARE_PROVIDER_SITE_OTHER): Payer: Self-pay | Admitting: Vascular Surgery

## 2018-10-10 ENCOUNTER — Other Ambulatory Visit: Payer: Self-pay

## 2018-10-10 ENCOUNTER — Ambulatory Visit (INDEPENDENT_AMBULATORY_CARE_PROVIDER_SITE_OTHER): Payer: Medicare HMO | Admitting: Physician Assistant

## 2018-10-10 ENCOUNTER — Encounter: Payer: Self-pay | Admitting: Physician Assistant

## 2018-10-10 VITALS — BP 144/81 | HR 98 | Temp 98.4°F | Resp 16 | Wt 293.0 lb

## 2018-10-10 DIAGNOSIS — R809 Proteinuria, unspecified: Secondary | ICD-10-CM | POA: Diagnosis not present

## 2018-10-10 DIAGNOSIS — E1129 Type 2 diabetes mellitus with other diabetic kidney complication: Secondary | ICD-10-CM | POA: Diagnosis not present

## 2018-10-10 LAB — POCT GLYCOSYLATED HEMOGLOBIN (HGB A1C)
Est. average glucose Bld gHb Est-mCnc: 169
Hemoglobin A1C: 7.5 % — AB (ref 4.0–5.6)

## 2018-10-10 NOTE — Progress Notes (Signed)
Patient: Barbara Anderson Female    DOB: [birth year]   48 y.o.   MRN: [id number] Visit Date: 10/20/2018  Today's Provider: Mar Daring, PA-C   Chief Complaint  Patient presents with  . Diabetes   Subjective:    I, Sulibeya S. Dimas, CMA, am acting as a Education administrator for [employer], PA-C.   HPI  Diabetes Mellitus Type II, Follow-up:   Lab Results  Component Value Date   HGBA1C 7.5 (A) 10/10/2018   HGBA1C 8.4 (H) 07/10/2018   HGBA1C 6.1 (H) 04/11/2017    Last seen for diabetes 3 months ago.  Management since then includes no changes. She reports excellent compliance with treatment. She is not having side effects.  Current symptoms include none and have been stable. Home blood sugar records: fasting range: 70's-100  Episodes of hypoglycemia? no   Current Insulin Regimen:  Most Recent Eye Exam: Dr. Gloriann Loan Weight trend: stable Prior visit with dietician: No Current exercise: none Current diet habits: not asked  Pertinent Labs:    Component Value Date/Time   CHOL 202 (H) 04/11/2017 0702   TRIG 149 04/11/2017 0702   HDL 33 (L) 04/11/2017 0702   LDLCALC 139 (H) 04/11/2017 0702   CREATININE 1.30 (H) 10/10/2018 1614   CREATININE 0.88 05/12/2014 1947   CREATININE 0.79 04/22/2014 1503    Wt Readings from Last 3 Encounters:  10/10/18 293 lb (132.9 kg)  09/26/18 (!) 302 lb 3.2 oz (137.1 kg)  09/22/18 (!) 304 lb (137.9 kg)    ------------------------------------------------------------------------  No Known Allergies   Current Outpatient Medications:  .  ACCU-CHEK SOFTCLIX LANCETS lancets, Use as instructed, Disp: 100 each, Rfl: 12 .  albuterol (PROVENTIL HFA;VENTOLIN HFA) 108 (90 Base) MCG/ACT inhaler, Inhale 2 puffs into the lungs every 4 (four) hours as needed for wheezing or shortness of breath., Disp: 1 Inhaler, Rfl: 0 .  Alcohol Swabs (B-D SINGLE USE SWABS REGULAR) PADS, USE TO CLEANSE SKIN DAILY BEFORE CHECKING BLOOD SUGAR, Disp: 100 each, Rfl: 1  .  atorvastatin (LIPITOR) 10 MG tablet, Take 1 tablet (10 mg total) by mouth daily., Disp: 90 tablet, Rfl: 3 .  Blood Glucose Monitoring Suppl (ACCU-CHEK AVIVA PLUS) w/Device KIT, To check blood sugar once daily, Disp: 1 kit, Rfl: 0 .  clotrimazole (LOTRIMIN) 1 % cream, Apply 1 application topically 2 (two) times daily., Disp: 30 g, Rfl: 0 .  DULoxetine (CYMBALTA) 60 MG capsule, Take 2 capsules (120 mg total) by mouth daily., Disp: 60 capsule, Rfl: 0 .  fluticasone (FLONASE) 50 MCG/ACT nasal spray, Place 2 sprays into both nostrils daily., Disp: 16 g, Rfl: 6 .  folic acid (FOLVITE) 1 MG tablet, Take 1 tablet (1 mg total) daily by mouth., Disp: 90 tablet, Rfl: 1 .  furosemide (LASIX) 40 MG tablet, Take 1 tablet (40 mg total) by mouth daily., Disp: 90 tablet, Rfl: 3 .  glucose blood (ACCU-CHEK AVIVA PLUS) test strip, To check blood sugar once daily, Disp: 100 each, Rfl: 12 .  hydrOXYzine (ATARAX/VISTARIL) 50 MG tablet, Take 1 tablet (50 mg total) by mouth 3 (three) times daily as needed for anxiety., Disp: 90 tablet, Rfl: 1 .  Lancets Misc. (ACCU-CHEK FASTCLIX LANCET) KIT, To check blood sugar once daily, Disp: 1 kit, Rfl: 0 .  levothyroxine (SYNTHROID, LEVOTHROID) 25 MCG tablet, Take 1 tablet (25 mcg total) by mouth daily before breakfast., Disp: 90 tablet, Rfl: 0 .  lisinopril (PRINIVIL,ZESTRIL) 20 MG tablet, Take 1 tablet (20 mg total)  by mouth daily., Disp: 90 tablet, Rfl: 3 .  meloxicam (MOBIC) 15 MG tablet, TAKE 1 TABLET BY MOUTH ONCE DAILY, Disp: 30 tablet, Rfl: 5 .  metFORMIN (GLUCOPHAGE) 500 MG tablet, Take 1 tablet (500 mg total) by mouth 2 (two) times daily with a meal., Disp: 180 tablet, Rfl: 0 .  Potassium Chloride ER 20 MEQ TBCR, Take 1 tablet by mouth daily., Disp: 90 tablet, Rfl: 1 .  pregabalin (LYRICA) 200 MG capsule, Take 1 capsule (200 mg total) by mouth 2 (two) times daily., Disp: 180 capsule, Rfl: 1 .  risperiDONE (RISPERDAL) 1 MG tablet, Take 1 tablet (1 mg total) by mouth at  bedtime., Disp: 30 tablet, Rfl: 1 .  topiramate (TOPAMAX) 200 MG tablet, Take 1 tablet (200 mg total) by mouth at bedtime., Disp: 30 tablet, Rfl: 1 .  traZODone (DESYREL) 150 MG tablet, Take 1 tablet (150 mg total) by mouth at bedtime., Disp: 30 tablet, Rfl: 1 .  Vitamin D, Ergocalciferol, (DRISDOL) 1.25 MG (50000 UT) CAPS capsule, TAKE 1 CAPSULE EVERY 7 DAYS, Disp: 12 capsule, Rfl: 0  Review of Systems  Constitutional: Negative.   Respiratory: Negative.   Cardiovascular: Negative.   Endocrine: Negative.   Neurological: Negative.   Psychiatric/Behavioral: Negative.     Social History   Tobacco Use  . Smoking status: Never Smoker  . Smokeless tobacco: Never Used  Substance Use Topics  . Alcohol use: No    Alcohol/week: 0.0 standard drinks      Objective:   BP (!) 144/81 (BP Location: Left Arm, Patient Position: Sitting, Cuff Size: Normal)   Pulse 98   Temp 98.4 F (36.9 C) (Oral)   Resp 16   Wt 293 lb (132.9 kg)   BMI 45.89 kg/m  Vitals:   10/10/18 1558  BP: (!) 144/81  Pulse: 98  Resp: 16  Temp: 98.4 F (36.9 C)  TempSrc: Oral  Weight: 293 lb (132.9 kg)     Physical Exam Vitals signs reviewed.  Constitutional:      General: She is not in acute distress.    Appearance: Normal appearance. She is well-developed. She is obese. She is not ill-appearing or diaphoretic.  Neck:     Musculoskeletal: Normal range of motion and neck supple.     Thyroid: No thyromegaly.     Vascular: No JVD.     Trachea: No tracheal deviation.  Cardiovascular:     Rate and Rhythm: Normal rate and regular rhythm.     Heart sounds: Normal heart sounds. No murmur. No friction rub. No gallop.   Pulmonary:     Effort: Pulmonary effort is normal. No respiratory distress.     Breath sounds: Normal breath sounds. No wheezing or rales.  Lymphadenopathy:     Cervical: No cervical adenopathy.  Neurological:     Mental Status: She is alert.         Assessment & Plan    1. Type 2  diabetes mellitus with microalbuminuria, without long-term current use of insulin (HCC) A1c improving. Continue Metoformin '500mg'$  BID. Weight improving as well. Will check labs as below and f/u pending results. I will see her back in 3 months. - POCT glycosylated hemoglobin (Hb A1C) - CBC w/Diff/Platelet - Comprehensive Metabolic Panel (CMET)     Mar Daring, PA-C  [facility]

## 2018-10-10 NOTE — Patient Instructions (Signed)
Diabetes Mellitus and Nutrition, Adult  When you have diabetes (diabetes mellitus), it is very important to have healthy eating habits because your blood sugar (glucose) levels are greatly affected by what you eat and drink. Eating healthy foods in the appropriate amounts, at about the same times every day, can help you:  · Control your blood glucose.  · Lower your risk of heart disease.  · Improve your blood pressure.  · Reach or maintain a healthy weight.  Every person with diabetes is different, and each person has different needs for a meal plan. Your health care provider may recommend that you work with a diet and nutrition specialist (dietitian) to make a meal plan that is best for you. Your meal plan may vary depending on factors such as:  · The calories you need.  · The medicines you take.  · Your weight.  · Your blood glucose, blood pressure, and cholesterol levels.  · Your activity level.  · Other health conditions you have, such as heart or kidney disease.  How do carbohydrates affect me?  Carbohydrates, also called carbs, affect your blood glucose level more than any other type of food. Eating carbs naturally raises the amount of glucose in your blood. Carb counting is a method for keeping track of how many carbs you eat. Counting carbs is important to keep your blood glucose at a healthy level, especially if you use insulin or take certain oral diabetes medicines.  It is important to know how many carbs you can safely have in each meal. This is different for every person. Your dietitian can help you calculate how many carbs you should have at each meal and for each snack.  Foods that contain carbs include:  · Bread, cereal, rice, pasta, and crackers.  · Potatoes and corn.  · Peas, beans, and lentils.  · Milk and yogurt.  · Fruit and juice.  · Desserts, such as cakes, cookies, ice cream, and candy.  How does alcohol affect me?  Alcohol can cause a sudden decrease in blood glucose (hypoglycemia),  especially if you use insulin or take certain oral diabetes medicines. Hypoglycemia can be a life-threatening condition. Symptoms of hypoglycemia (sleepiness, dizziness, and confusion) are similar to symptoms of having too much alcohol.  If your health care provider says that alcohol is safe for you, follow these guidelines:  · Limit alcohol intake to no more than 1 drink per day for nonpregnant women and 2 drinks per day for men. One drink equals 12 oz of beer, 5 oz of wine, or 1½ oz of hard liquor.  · Do not drink on an empty stomach.  · Keep yourself hydrated with water, diet soda, or unsweetened iced tea.  · Keep in mind that regular soda, juice, and other mixers may contain a lot of sugar and must be counted as carbs.  What are tips for following this plan?    Reading food labels  · Start by checking the serving size on the "Nutrition Facts" label of packaged foods and drinks. The amount of calories, carbs, fats, and other nutrients listed on the label is based on one serving of the item. Many items contain more than one serving per package.  · Check the total grams (g) of carbs in one serving. You can calculate the number of servings of carbs in one serving by dividing the total carbs by 15. For example, if a food has 30 g of total carbs, it would be equal to 2   servings of carbs.  · Check the number of grams (g) of saturated and trans fats in one serving. Choose foods that have low or no amount of these fats.  · Check the number of milligrams (mg) of salt (sodium) in one serving. Most people should limit total sodium intake to less than 2,300 mg per day.  · Always check the nutrition information of foods labeled as "low-fat" or "nonfat". These foods may be higher in added sugar or refined carbs and should be avoided.  · Talk to your dietitian to identify your daily goals for nutrients listed on the label.  Shopping  · Avoid buying canned, premade, or processed foods. These foods tend to be high in fat, sodium,  and added sugar.  · Shop around the outside edge of the grocery store. This includes fresh fruits and vegetables, bulk grains, fresh meats, and fresh dairy.  Cooking  · Use low-heat cooking methods, such as baking, instead of high-heat cooking methods like deep frying.  · Cook using healthy oils, such as olive, canola, or sunflower oil.  · Avoid cooking with butter, cream, or high-fat meats.  Meal planning  · Eat meals and snacks regularly, preferably at the same times every day. Avoid going long periods of time without eating.  · Eat foods high in fiber, such as fresh fruits, vegetables, beans, and whole grains. Talk to your dietitian about how many servings of carbs you can eat at each meal.  · Eat 4-6 ounces (oz) of lean protein each day, such as lean meat, chicken, fish, eggs, or tofu. One oz of lean protein is equal to:  ? 1 oz of meat, chicken, or fish.  ? 1 egg.  ? ¼ cup of tofu.  · Eat some foods each day that contain healthy fats, such as avocado, nuts, seeds, and fish.  Lifestyle  · Check your blood glucose regularly.  · Exercise regularly as told by your health care provider. This may include:  ? 150 minutes of moderate-intensity or vigorous-intensity exercise each week. This could be brisk walking, biking, or water aerobics.  ? Stretching and doing strength exercises, such as yoga or weightlifting, at least 2 times a week.  · Take medicines as told by your health care provider.  · Do not use any products that contain nicotine or tobacco, such as cigarettes and e-cigarettes. If you need help quitting, ask your health care provider.  · Work with a counselor or diabetes educator to identify strategies to manage stress and any emotional and social challenges.  Questions to ask a health care provider  · Do I need to meet with a diabetes educator?  · Do I need to meet with a dietitian?  · What number can I call if I have questions?  · When are the best times to check my blood glucose?  Where to find more  information:  · American Diabetes Association: diabetes.org  · Academy of Nutrition and Dietetics: www.eatright.org  · National Institute of Diabetes and Digestive and Kidney Diseases (NIH): www.niddk.nih.gov  Summary  · A healthy meal plan will help you control your blood glucose and maintain a healthy lifestyle.  · Working with a diet and nutrition specialist (dietitian) can help you make a meal plan that is best for you.  · Keep in mind that carbohydrates (carbs) and alcohol have immediate effects on your blood glucose levels. It is important to count carbs and to use alcohol carefully.  This information is not intended to   replace advice given to you by your health care provider. Make sure you discuss any questions you have with your health care provider.  Document Released: 04/12/2005 Document Revised: 02/13/2017 Document Reviewed: 08/20/2016  Elsevier Interactive Patient Education © 2019 Elsevier Inc.

## 2018-10-11 LAB — CBC WITH DIFFERENTIAL/PLATELET
BASOS ABS: 0.1 10*3/uL (ref 0.0–0.2)
Basos: 1 %
EOS (ABSOLUTE): 1 10*3/uL — ABNORMAL HIGH (ref 0.0–0.4)
Eos: 9 %
Hematocrit: 38 % (ref 34.0–46.6)
Hemoglobin: 12.2 g/dL (ref 11.1–15.9)
Immature Grans (Abs): 0 10*3/uL (ref 0.0–0.1)
Immature Granulocytes: 0 %
Lymphocytes Absolute: 2.2 10*3/uL (ref 0.7–3.1)
Lymphs: 21 %
MCH: 27.5 pg (ref 26.6–33.0)
MCHC: 32.1 g/dL (ref 31.5–35.7)
MCV: 86 fL (ref 79–97)
Monocytes Absolute: 0.6 10*3/uL (ref 0.1–0.9)
Monocytes: 5 %
Neutrophils Absolute: 7 10*3/uL (ref 1.4–7.0)
Neutrophils: 64 %
Platelets: 287 10*3/uL (ref 150–450)
RBC: 4.44 x10E6/uL (ref 3.77–5.28)
RDW: 14.1 % (ref 11.7–15.4)
WBC: 11 10*3/uL — ABNORMAL HIGH (ref 3.4–10.8)

## 2018-10-11 LAB — COMPREHENSIVE METABOLIC PANEL
ALT: 13 IU/L (ref 0–32)
AST: 17 IU/L (ref 0–40)
Albumin/Globulin Ratio: 1.6 (ref 1.2–2.2)
Albumin: 4.5 g/dL (ref 3.8–4.8)
Alkaline Phosphatase: 117 IU/L (ref 39–117)
BUN/Creatinine Ratio: 12 (ref 9–23)
BUN: 15 mg/dL (ref 6–24)
Bilirubin Total: 0.3 mg/dL (ref 0.0–1.2)
CO2: 19 mmol/L — ABNORMAL LOW (ref 20–29)
Calcium: 9.3 mg/dL (ref 8.7–10.2)
Chloride: 106 mmol/L (ref 96–106)
Creatinine, Ser: 1.3 mg/dL — ABNORMAL HIGH (ref 0.57–1.00)
GFR calc Af Amer: 56 mL/min/{1.73_m2} — ABNORMAL LOW (ref >59)
GFR calc non Af Amer: 48 mL/min/{1.73_m2} — ABNORMAL LOW (ref >59)
GLUCOSE: 148 mg/dL — AB (ref 65–99)
Globulin, Total: 2.8 g/dL (ref 1.5–4.5)
Potassium: 4.2 mmol/L (ref 3.5–5.2)
Sodium: 145 mmol/L — ABNORMAL HIGH (ref 134–144)
Total Protein: 7.3 g/dL (ref 6.0–8.5)

## 2018-11-01 ENCOUNTER — Other Ambulatory Visit: Payer: Self-pay | Admitting: Physician Assistant

## 2018-11-01 DIAGNOSIS — E119 Type 2 diabetes mellitus without complications: Secondary | ICD-10-CM

## 2018-11-02 ENCOUNTER — Other Ambulatory Visit: Payer: Self-pay | Admitting: Physician Assistant

## 2018-11-02 DIAGNOSIS — E559 Vitamin D deficiency, unspecified: Secondary | ICD-10-CM

## 2018-11-03 ENCOUNTER — Other Ambulatory Visit: Payer: Self-pay | Admitting: Physician Assistant

## 2018-11-03 DIAGNOSIS — M25552 Pain in left hip: Secondary | ICD-10-CM

## 2018-11-03 DIAGNOSIS — I10 Essential (primary) hypertension: Secondary | ICD-10-CM

## 2018-11-03 DIAGNOSIS — E119 Type 2 diabetes mellitus without complications: Secondary | ICD-10-CM

## 2018-11-03 DIAGNOSIS — J0140 Acute pansinusitis, unspecified: Secondary | ICD-10-CM

## 2018-11-03 DIAGNOSIS — J40 Bronchitis, not specified as acute or chronic: Secondary | ICD-10-CM

## 2018-11-03 DIAGNOSIS — R809 Proteinuria, unspecified: Secondary | ICD-10-CM

## 2018-11-03 DIAGNOSIS — M25512 Pain in left shoulder: Secondary | ICD-10-CM

## 2018-11-03 DIAGNOSIS — E1129 Type 2 diabetes mellitus with other diabetic kidney complication: Secondary | ICD-10-CM

## 2018-11-03 DIAGNOSIS — R600 Localized edema: Secondary | ICD-10-CM

## 2018-11-03 DIAGNOSIS — M25511 Pain in right shoulder: Secondary | ICD-10-CM

## 2018-11-03 DIAGNOSIS — E034 Atrophy of thyroid (acquired): Secondary | ICD-10-CM

## 2018-11-03 DIAGNOSIS — M25551 Pain in right hip: Secondary | ICD-10-CM

## 2018-11-03 MED ORDER — FUROSEMIDE 40 MG PO TABS: 40.0000 mg | ORAL_TABLET | Freq: Every day | ORAL | 3 refills | Status: DC

## 2018-11-03 MED ORDER — ACCU-CHEK SOFTCLIX LANCETS MISC: 12 refills | Status: DC

## 2018-11-03 MED ORDER — ATORVASTATIN CALCIUM 10 MG PO TABS: 10.0000 mg | ORAL_TABLET | Freq: Every day | ORAL | 3 refills | Status: DC

## 2018-11-03 MED ORDER — LEVOTHYROXINE SODIUM 25 MCG PO TABS: 25.0000 ug | ORAL_TABLET | Freq: Every day | ORAL | 1 refills | Status: DC

## 2018-11-03 MED ORDER — GLUCOSE BLOOD VI STRP: ORAL_STRIP | 12 refills | Status: DC

## 2018-11-03 MED ORDER — ACCU-CHEK AVIVA PLUS W/DEVICE KIT: PACK | 0 refills | Status: DC

## 2018-11-03 MED ORDER — ALBUTEROL SULFATE HFA 108 (90 BASE) MCG/ACT IN AERS: 2.0000 | INHALATION_SPRAY | RESPIRATORY_TRACT | 3 refills | Status: DC | PRN

## 2018-11-03 MED ORDER — FLUTICASONE PROPIONATE 50 MCG/ACT NA SUSP: 2.0000 | Freq: Every day | NASAL | 6 refills | Status: DC

## 2018-11-03 MED ORDER — MELOXICAM 15 MG PO TABS: 15.0000 mg | ORAL_TABLET | Freq: Every day | ORAL | 1 refills | Status: DC

## 2018-11-03 MED ORDER — LISINOPRIL 20 MG PO TABS: 20.0000 mg | ORAL_TABLET | Freq: Every day | ORAL | 3 refills | Status: DC

## 2018-11-03 NOTE — Telephone Encounter (Signed)
[facility] faxed refill request for the following medications:  1. furosemide (LASIX) 40 MG tablet 2. lisinopril (PRINIVIL,ZESTRIL) 20 MG tablet  3. metFORMIN (GLUCOPHAGE) 500 MG tablet 4. meloxicam (MOBIC) 15 MG tablet 5. atorvastatin (LIPITOR) 10 MG tablet 6. ACCU-CHEK SOFTCLIX LANCETS  7. fluticasone (FLONASE) 50 MCG/ACT nasal spray 8. levothyroxine (SYNTHROID, LEVOTHROID) 25 MCG tablet  9. albuterol (PROVENTIL HFA;VENTOLIN HFA) 108 (90 Base) MCG/ACT inhaler  90 day supply  Please advise. Thanks TNP

## 2018-11-03 NOTE — Progress Notes (Signed)
Sent in orders to [facility] for accu-chek aviva meter and strips

## 2018-11-10 ENCOUNTER — Other Ambulatory Visit: Payer: Self-pay | Admitting: *Deleted

## 2018-11-10 DIAGNOSIS — G894 Chronic pain syndrome: Secondary | ICD-10-CM

## 2018-11-10 DIAGNOSIS — R601 Generalized edema: Secondary | ICD-10-CM

## 2018-11-10 DIAGNOSIS — E119 Type 2 diabetes mellitus without complications: Secondary | ICD-10-CM

## 2018-11-11 MED ORDER — PREGABALIN 200 MG PO CAPS: 200.0000 mg | ORAL_CAPSULE | Freq: Two times a day (BID) | ORAL | 1 refills | Status: DC

## 2018-11-11 MED ORDER — METFORMIN HCL 500 MG PO TABS: 500.0000 mg | ORAL_TABLET | Freq: Two times a day (BID) | ORAL | 1 refills | Status: DC

## 2018-11-11 MED ORDER — POTASSIUM CHLORIDE ER 20 MEQ PO TBCR: 1.0000 | EXTENDED_RELEASE_TABLET | Freq: Every day | ORAL | 1 refills | Status: DC

## 2018-11-28 ENCOUNTER — Telehealth: Payer: Self-pay

## 2018-11-28 NOTE — Telephone Encounter (Signed)
Called patient and patient returned my call to complete her PhQ2.

## 2018-12-05 DIAGNOSIS — I890 Lymphedema, not elsewhere classified: Secondary | ICD-10-CM | POA: Diagnosis not present

## 2018-12-25 ENCOUNTER — Telehealth (INDEPENDENT_AMBULATORY_CARE_PROVIDER_SITE_OTHER): Payer: Self-pay | Admitting: Vascular Surgery

## 2018-12-25 NOTE — Telephone Encounter (Signed)
Patient is being monitor for the lymphedema for her legs and she do have a lymphedema pump but she has not been seen for the upper extremities swelling

## 2018-12-25 NOTE — Telephone Encounter (Signed)
She needs to be evaluated, we have only evaluated swelling in her legs.  Bring her in for an appointment, but not this week

## 2018-12-29 ENCOUNTER — Telehealth: Payer: Self-pay | Admitting: Physician Assistant

## 2018-12-29 ENCOUNTER — Ambulatory Visit (INDEPENDENT_AMBULATORY_CARE_PROVIDER_SITE_OTHER): Payer: Medicare HMO | Admitting: Nurse Practitioner

## 2018-12-29 ENCOUNTER — Encounter (INDEPENDENT_AMBULATORY_CARE_PROVIDER_SITE_OTHER): Payer: Self-pay | Admitting: Nurse Practitioner

## 2018-12-29 ENCOUNTER — Other Ambulatory Visit: Payer: Self-pay

## 2018-12-29 VITALS — BP 114/79 | HR 96 | Resp 18 | Ht 67.0 in | Wt 298.0 lb

## 2018-12-29 DIAGNOSIS — R600 Localized edema: Secondary | ICD-10-CM

## 2018-12-29 DIAGNOSIS — R809 Proteinuria, unspecified: Secondary | ICD-10-CM

## 2018-12-29 DIAGNOSIS — E1129 Type 2 diabetes mellitus with other diabetic kidney complication: Secondary | ICD-10-CM

## 2018-12-29 DIAGNOSIS — Z79899 Other long term (current) drug therapy: Secondary | ICD-10-CM

## 2018-12-29 DIAGNOSIS — Z7984 Long term (current) use of oral hypoglycemic drugs: Secondary | ICD-10-CM

## 2018-12-29 DIAGNOSIS — I10 Essential (primary) hypertension: Secondary | ICD-10-CM | POA: Diagnosis not present

## 2018-12-29 NOTE — Telephone Encounter (Signed)
error 

## 2018-12-31 NOTE — Progress Notes (Signed)
Patient: Barbara Anderson Female    DOB: [birth year]   48 y.o.   MRN: [id number] Visit Date: 01/01/2019  Today's Provider: Mar Daring, PA-C   Chief Complaint  Patient presents with  . Follow-up  . Diabetes  . Hypothyroidism  . Hypertension   Subjective:     HPI   Type 2 diabetes mellitus with microalbuminuria, without long-term current use of insulin (HCC) From 10/10/2018-A1c 7.5. no changes.  Hypothyroidism due to acquired atrophy of thyroid From 09/26/2018-labs checked, no changes.   Essential hypertension From 09/26/2018-Increased lisinopril to 20 mg.    No Known Allergies   Current Outpatient Medications:  .  Accu-Chek Softclix Lancets lancets, Use as instructed, Disp: 100 each, Rfl: 12 .  albuterol (PROVENTIL HFA;VENTOLIN HFA) 108 (90 Base) MCG/ACT inhaler, Inhale 2 puffs into the lungs every 4 (four) hours as needed for wheezing or shortness of breath., Disp: 18 g, Rfl: 3 .  Alcohol Swabs (B-D SINGLE USE SWABS REGULAR) PADS, USE TO CLEANSE SKIN DAILY BEFORE CHECKING BLOOD SUGAR, Disp: 100 each, Rfl: 1 .  atorvastatin (LIPITOR) 10 MG tablet, Take 1 tablet (10 mg total) by mouth daily., Disp: 90 tablet, Rfl: 3 .  Blood Glucose Monitoring Suppl (ACCU-CHEK AVIVA PLUS) w/Device KIT, To check blood sugar once daily, Disp: 1 kit, Rfl: 0 .  clotrimazole (LOTRIMIN) 1 % cream, Apply 1 application topically 2 (two) times daily., Disp: 30 g, Rfl: 0 .  DULoxetine (CYMBALTA) 60 MG capsule, Take 2 capsules (120 mg total) by mouth daily., Disp: 60 capsule, Rfl: 0 .  fluticasone (FLONASE) 50 MCG/ACT nasal spray, Place 2 sprays into both nostrils daily., Disp: 16 g, Rfl: 6 .  folic acid (FOLVITE) 1 MG tablet, Take 1 tablet (1 mg total) daily by mouth., Disp: 90 tablet, Rfl: 1 .  furosemide (LASIX) 40 MG tablet, Take 1 tablet (40 mg total) by mouth daily., Disp: 90 tablet, Rfl: 3 .  glucose blood (ACCU-CHEK AVIVA PLUS) test strip, To check blood sugar once daily, Disp: 100  each, Rfl: 12 .  hydrOXYzine (ATARAX/VISTARIL) 50 MG tablet, Take 1 tablet (50 mg total) by mouth 3 (three) times daily as needed for anxiety., Disp: 90 tablet, Rfl: 1 .  Lancets Misc. (ACCU-CHEK FASTCLIX LANCET) KIT, To check blood sugar once daily, Disp: 1 kit, Rfl: 0 .  levothyroxine (SYNTHROID, LEVOTHROID) 25 MCG tablet, Take 1 tablet (25 mcg total) by mouth daily before breakfast., Disp: 90 tablet, Rfl: 1 .  lisinopril (PRINIVIL,ZESTRIL) 20 MG tablet, Take 1 tablet (20 mg total) by mouth daily., Disp: 90 tablet, Rfl: 3 .  meloxicam (MOBIC) 15 MG tablet, Take 1 tablet (15 mg total) by mouth daily., Disp: 90 tablet, Rfl: 1 .  metFORMIN (GLUCOPHAGE) 500 MG tablet, Take 1 tablet (500 mg total) by mouth 2 (two) times daily with a meal., Disp: 180 tablet, Rfl: 1 .  Potassium Chloride ER 20 MEQ TBCR, Take 1 tablet by mouth daily., Disp: 90 tablet, Rfl: 1 .  pregabalin (LYRICA) 200 MG capsule, Take 1 capsule (200 mg total) by mouth 2 (two) times daily., Disp: 180 capsule, Rfl: 1 .  risperiDONE (RISPERDAL) 1 MG tablet, Take 1 tablet (1 mg total) by mouth at bedtime., Disp: 30 tablet, Rfl: 1 .  topiramate (TOPAMAX) 200 MG tablet, Take 1 tablet (200 mg total) by mouth at bedtime., Disp: 30 tablet, Rfl: 1 .  traZODone (DESYREL) 150 MG tablet, Take 1 tablet (150 mg total) by mouth at bedtime., Disp:  30 tablet, Rfl: 1 .  Vitamin D, Ergocalciferol, (DRISDOL) 1.25 MG (50000 UT) CAPS capsule, TAKE 1 CAPSULE EVERY 7 DAYS, Disp: 12 capsule, Rfl: 1  Review of Systems  Constitutional: Negative for appetite change, chills, fatigue and fever.  Respiratory: Negative for chest tightness and shortness of breath.   Cardiovascular: Positive for leg swelling (always has but has been improving). Negative for chest pain and palpitations.  Gastrointestinal: Negative for abdominal pain, nausea and vomiting.  Endocrine: Negative for polydipsia, polyphagia and polyuria.  Neurological: Negative for dizziness and weakness.     Social History   Tobacco Use  . Smoking status: Never Smoker  . Smokeless tobacco: Never Used  Substance Use Topics  . Alcohol use: No    Alcohol/week: 0.0 standard drinks      Objective:   BP (!) 146/84 (BP Location: Right Arm, Patient Position: Sitting, Cuff Size: Large)   Pulse (!) 101   Temp 98.4 F (36.9 C) (Oral)   Resp 16   Ht _0  (1.702 m)   Wt 294 lb (133.4 kg)   SpO2 97%   BMI 46.05 kg/m  Vitals:   01/01/19 1414  BP: (!) 146/84  Pulse: (!) 101  Resp: 16  Temp: 98.4 F (36.9 C)  TempSrc: Oral  SpO2: 97%  Weight: 294 lb (133.4 kg)  Height: _1  (1.702 m)     Physical Exam Vitals signs reviewed.  Constitutional:      General: She is not in acute distress.    Appearance: Normal appearance. She is well-developed. She is obese. She is not ill-appearing or diaphoretic.  Neck:     Musculoskeletal: Normal range of motion and neck supple.     Thyroid: No thyromegaly.     Vascular: No JVD.     Trachea: No tracheal deviation.  Cardiovascular:     Rate and Rhythm: Normal rate and regular rhythm.     Pulses: Normal pulses.     Heart sounds: Normal heart sounds. No murmur. No friction rub. No gallop.   Pulmonary:     Effort: Pulmonary effort is normal. No respiratory distress.     Breath sounds: Normal breath sounds. No wheezing or rales.  Musculoskeletal:     Right lower leg: Edema (2+) present.     Left lower leg: Edema (2+) present.  Lymphadenopathy:     Cervical: No cervical adenopathy.  Neurological:     Mental Status: She is alert.         Assessment & Plan    1. Type 2 diabetes mellitus with microalbuminuria, without long-term current use of insulin (HCC) A1c stable at 6.8. Continue current medical treatment plan and will f/u other labs.  - POCT glycosylated hemoglobin (Hb A1C) - CBC w/Diff/Platelet - Basic Metabolic Panel (BMET) - Vitamin D (25 hydroxy) - B12 and Folate Panel - Ambulatory referral to Nephrology  2. Hypothyroidism due  to acquired atrophy of thyroid Stable Will check labs as below and f/u pending results. - CBC w/Diff/Platelet - Basic Metabolic Panel (BMET) - Thyroid Panel With TSH - Vitamin D (25 hydroxy) - B12 and Folate Panel  3. Fatigue, unspecified type Will check labs as below and f/u pending results. - Vitamin D (25 hydroxy) - B12 and Folate Panel  4. Generalized edema Lower extremity edema improving with lymphedema treatments. Reports feeling more edema in her neck and face now. Will check labs as below and f/u pending results. - Vitamin D (25 hydroxy) - B12 and Folate Panel  5.  Class 3 severe obesity due to excess calories with serious comorbidity and body mass index (BMI) of 45.0 to 49.9 in adult [facility]) Counseled patient on healthy lifestyle modifications including dieting and exercise.   6. Bipolar I disorder, current or most recent episode depressed, with psychotic features ([place]) Stable.  7. Traumatic brain injury, with loss of consciousness of 30 minutes or less, sequela (HCC) Stable.   8. CKD stage 3 due to type 2 diabetes mellitus ([place]) Noted on labs to have bump in serum creatinine. Will refer to nephrology for further evaluation.  - Ambulatory referral to Nephrology     Mar Daring, PA-C  [facility]

## 2019-01-01 ENCOUNTER — Other Ambulatory Visit: Payer: Self-pay

## 2019-01-01 ENCOUNTER — Encounter: Payer: Self-pay | Admitting: Physician Assistant

## 2019-01-01 ENCOUNTER — Ambulatory Visit (INDEPENDENT_AMBULATORY_CARE_PROVIDER_SITE_OTHER): Payer: Medicare HMO | Admitting: Physician Assistant

## 2019-01-01 VITALS — BP 146/84 | HR 101 | Temp 98.4°F | Resp 16 | Ht 67.0 in | Wt 294.0 lb

## 2019-01-01 DIAGNOSIS — S069X1S Unspecified intracranial injury with loss of consciousness of 30 minutes or less, sequela: Secondary | ICD-10-CM | POA: Diagnosis not present

## 2019-01-01 DIAGNOSIS — E1129 Type 2 diabetes mellitus with other diabetic kidney complication: Secondary | ICD-10-CM

## 2019-01-01 DIAGNOSIS — F315 Bipolar disorder, current episode depressed, severe, with psychotic features: Secondary | ICD-10-CM

## 2019-01-01 DIAGNOSIS — E1122 Type 2 diabetes mellitus with diabetic chronic kidney disease: Secondary | ICD-10-CM

## 2019-01-01 DIAGNOSIS — Z6841 Body Mass Index (BMI) 40.0 and over, adult: Secondary | ICD-10-CM | POA: Diagnosis not present

## 2019-01-01 DIAGNOSIS — E034 Atrophy of thyroid (acquired): Secondary | ICD-10-CM

## 2019-01-01 DIAGNOSIS — N183 Chronic kidney disease, stage 3 unspecified: Secondary | ICD-10-CM

## 2019-01-01 DIAGNOSIS — R601 Generalized edema: Secondary | ICD-10-CM | POA: Diagnosis not present

## 2019-01-01 DIAGNOSIS — R809 Proteinuria, unspecified: Secondary | ICD-10-CM

## 2019-01-01 DIAGNOSIS — R5383 Other fatigue: Secondary | ICD-10-CM

## 2019-01-01 LAB — POCT GLYCOSYLATED HEMOGLOBIN (HGB A1C)
Est. average glucose Bld gHb Est-mCnc: 148
Hemoglobin A1C: 6.8 % — AB (ref 4.0–5.6)

## 2019-01-01 NOTE — Patient Instructions (Signed)

## 2019-01-02 ENCOUNTER — Telehealth: Payer: Self-pay | Admitting: *Deleted

## 2019-01-02 LAB — CBC WITH DIFFERENTIAL/PLATELET
Basophils Absolute: 0.1 10*3/uL (ref 0.0–0.2)
Basos: 1 %
EOS (ABSOLUTE): 1.6 10*3/uL — ABNORMAL HIGH (ref 0.0–0.4)
Eos: 16 %
Hematocrit: 31.2 % — ABNORMAL LOW (ref 34.0–46.6)
Hemoglobin: 10.2 g/dL — ABNORMAL LOW (ref 11.1–15.9)
Immature Grans (Abs): 0 10*3/uL (ref 0.0–0.1)
Immature Granulocytes: 0 %
Lymphocytes Absolute: 2.2 10*3/uL (ref 0.7–3.1)
Lymphs: 24 %
MCH: 27.6 pg (ref 26.6–33.0)
MCHC: 32.7 g/dL (ref 31.5–35.7)
MCV: 84 fL (ref 79–97)
Monocytes Absolute: 0.4 10*3/uL (ref 0.1–0.9)
Monocytes: 5 %
Neutrophils Absolute: 5.2 10*3/uL (ref 1.4–7.0)
Neutrophils: 54 %
Platelets: 236 10*3/uL (ref 150–450)
RBC: 3.7 x10E6/uL — ABNORMAL LOW (ref 3.77–5.28)
RDW: 14.5 % (ref 11.7–15.4)
WBC: 9.5 10*3/uL (ref 3.4–10.8)

## 2019-01-02 LAB — BASIC METABOLIC PANEL
BUN/Creatinine Ratio: 19 (ref 9–23)
BUN: 31 mg/dL — ABNORMAL HIGH (ref 6–24)
CO2: 18 mmol/L — ABNORMAL LOW (ref 20–29)
Calcium: 8.9 mg/dL (ref 8.7–10.2)
Chloride: 104 mmol/L (ref 96–106)
Creatinine, Ser: 1.64 mg/dL — ABNORMAL HIGH (ref 0.57–1.00)
GFR calc Af Amer: 42 mL/min/{1.73_m2} — ABNORMAL LOW (ref >59)
GFR calc non Af Amer: 36 mL/min/{1.73_m2} — ABNORMAL LOW (ref >59)
Glucose: 179 mg/dL — ABNORMAL HIGH (ref 65–99)
Potassium: 4.2 mmol/L (ref 3.5–5.2)
Sodium: 140 mmol/L (ref 134–144)

## 2019-01-02 LAB — VITAMIN D 25 HYDROXY (VIT D DEFICIENCY, FRACTURES): Vit D, 25-Hydroxy: 40.1 ng/mL (ref 30.0–100.0)

## 2019-01-02 LAB — B12 AND FOLATE PANEL
Folate: >20 ng/mL (ref >3.0)
Vitamin B-12: 257 pg/mL (ref 232–1245)

## 2019-01-02 LAB — THYROID PANEL WITH TSH
Free Thyroxine Index: 1.6 (ref 1.2–4.9)
T3 Uptake Ratio: 29 % (ref 24–39)
T4, Total: 5.4 ug/dL (ref 4.5–12.0)
TSH: 1.84 u[IU]/mL (ref 0.450–4.500)

## 2019-01-02 NOTE — Telephone Encounter (Signed)
Patient was advised.  

## 2019-01-02 NOTE — Telephone Encounter (Signed)
-----   Message from Mar Daring, [place] sent at 01/02/2019  1:45 PM EDT ----- Thyroid is normal. Hemoglobin dropped again to 10.2 from 12.2. B12, folate and Vit D are normal. Kidney function has declined since last check. I would recommend to stop the furosemide and I will refer you to a kidney specialist (nephrologist) for further evaluation.

## 2019-01-02 NOTE — Telephone Encounter (Signed)
Patient was notified of results. Expressed understanding. Patient is agreeable to referral. Patient wanted to know if she needs to do anything concerning hemoglobin? Please advise?

## 2019-01-02 NOTE — Telephone Encounter (Signed)
She can take ferrous sulfate 325mg  OTC daily.  Referral placed.

## 2019-01-04 ENCOUNTER — Encounter (INDEPENDENT_AMBULATORY_CARE_PROVIDER_SITE_OTHER): Payer: Self-pay | Admitting: Nurse Practitioner

## 2019-01-04 DIAGNOSIS — R600 Localized edema: Secondary | ICD-10-CM | POA: Insufficient documentation

## 2019-01-04 NOTE — Progress Notes (Signed)
SUBJECTIVE:  Patient ID: Barbara Anderson, female    DOB: 05/23/1971, 48 y.o.   MRN: [id number] Chief Complaint  Patient presents with  . Follow-up    ue swelling    HPI  Barbara Anderson is a 48 y.o. female that presents for concern for upper extremity swelling.  She has lower extremity edema and has excellent control of edema with her lymphedema pump. She denies any issues with her heart, kidney, or liver.  She denies any past history of cancer or surgery on her upper extremities.  She states that this has been ongoing for months.  She also denies issues with her thyroid or any endocrine issues.      Past Medical History:  Diagnosis Date  . ADD (attention deficit disorder)   . Anxiety   . Back pain   . Concussion 2001   head trauma-work related. Caused headaches and short term memory loss.  . Depression   . Diabetes mellitus without complication ([place])   . Overactive bladder   . Recurrent vaginitis     Past Surgical History:  Procedure Laterality Date  . ankle pin and plates placement     right  . COSMETIC SURGERY  2005   BREASTS, ARM, ABDOMEN AFTER WEIGHT LOSS    Social History   Socioeconomic History  . Marital status: Single    Spouse name: Not on file  . Number of children: Not on file  . Years of education: Not on file  . Highest education level: Not on file  Occupational History  . Not on file  Social Needs  . Financial resource strain: Not on file  . Food insecurity:    Worry: Not on file    Inability: Not on file  . Transportation needs:    Medical: Not on file    Non-medical: Not on file  Tobacco Use  . Smoking status: Never Smoker  . Smokeless tobacco: Never Used  Substance and Sexual Activity  . Alcohol use: No    Alcohol/week: 0.0 standard drinks  . Drug use: No  . Sexual activity: Yes    Birth control/protection: Condom  Lifestyle  . Physical activity:    Days per week: Not on file    Minutes per session: Not on file  . Stress: Not on file   Relationships  . Social connections:    Talks on phone: Not on file    Gets together: Not on file    Attends religious service: Not on file    Active member of club or organization: Not on file    Attends meetings of clubs or organizations: Not on file    Relationship status: Not on file  . Intimate partner violence:    Fear of current or ex partner: Not on file    Emotionally abused: Not on file    Physically abused: Not on file    Forced sexual activity: Not on file  Other Topics Concern  . Not on file  Social History Narrative  . Not on file    Family History  Problem Relation Age of Onset  . Diabetes Mother   . Hypertension Mother   . Depression Mother   . Diabetes Father   . Hypertension Father   . Anxiety disorder Father   . Depression Father   . ADD / ADHD Father   . Depression Sister   . Bipolar disorder Sister   . ADD / ADHD Brother     No Known Allergies  Review of Systems   Review of Systems: Negative Unless Checked Constitutional: []Weight loss  []Fever  []Chills Cardiac: []Chest pain   [] Atrial Fibrillation  []Palpitations   []Shortness of breath when laying flat   []Shortness of breath with exertion. []Shortness of breath at rest Vascular:  []Pain in legs with walking   []Pain in legs with standing []Pain in legs when laying flat   []Claudication    []Pain in feet when laying flat    []History of DVT   []Phlebitis   [x]Swelling in legs   []Varicose veins   []Non-healing ulcers Pulmonary:   []Uses home oxygen   []Productive cough   []Hemoptysis   []Wheeze  []COPD   []Asthma Neurologic:  []Dizziness   []Seizures  []Blackouts []History of stroke   []History of TIA  []Aphasia   []Temporary Blindness   [x]Weakness or numbness in arm   []Weakness or numbness in leg Musculoskeletal:   []Joint swelling   []Joint pain   []Low back pain  [] History of Knee Replacement []Arthritis []back Surgeries  [] Spinal Stenosis    Hematologic:  []Easy bruising  []Easy  bleeding   []Hypercoagulable state   []Anemic Gastrointestinal:  []Diarrhea   []Vomiting  []Gastroesophageal reflux/heartburn   []Difficulty swallowing. []Abdominal pain Genitourinary:  []Chronic kidney disease   []Difficult urination  []Anuric   []Blood in urine []Frequent urination  []Burning with urination   []Hematuria Skin:  []Rashes   []Ulcers []Wounds Psychological:  [x]History of anxiety   [x] History of major depression  [] Memory Difficulties      OBJECTIVE:   Physical Exam  BP 114/79 (BP Location: Right Arm)   Pulse 96   Resp 18   Ht 5' 7" (1.702 m)   Wt 298 lb (135.2 kg)   BMI 46.67 kg/m   Gen: WD/WN, NAD Head: [place]/AT, No temporalis wasting.  Ear/Nose/Throat: Hearing grossly intact, nares w/o erythema or drainage Eyes: PER, EOMI, sclera nonicteric.  Neck: Supple, no masses.  No JVD.  Pulmonary:  Good air movement, no use of accessory muscles.  Cardiac: RRR Vascular: Swollen upper extremities  Vessel Right Left  Radial Palpable Palpable   Gastrointestinal: soft, non-distended. No guarding/no peritoneal signs.  Musculoskeletal: M/S 5/5 throughout.  No deformity or atrophy.  Neurologic: Pain and light touch intact in extremities.  Symmetrical.  Speech is fluent. Motor exam as listed above. Psychiatric: Judgment intact, Mood & affect appropriate for pt's clinical situation. Dermatologic: No Venous rashes. No Ulcers Noted.  No changes consistent with cellulitis. Lymph : No Cervical lymphadenopathy, no lichenification or skin changes of chronic lymphedema.       ASSESSMENT AND PLAN:  1. Edema of upper extremity The patient wishes to obtain lymphedema sleeves for her upper extremities .  Patient also advised to purchase compression sleeves for her upper extremities, elevate as much as possible, and to exercise her upper extremities.  We will place a referral for compression sleeves.   2. Essential hypertension Continue antihypertensive medications as already ordered,  these medications have been reviewed and there are no changes at this time.   3. Type 2 diabetes mellitus with microalbuminuria, without long-term current use of insulin (HCC) Continue hypoglycemic medications as already ordered, these medications have been reviewed and there are no changes at this time.  Hgb A1C to be monitored as already arranged by primary service    Current Outpatient Medications on File Prior to Visit  Medication Sig Dispense Refill  . Accu-Chek Softclix Lancets lancets Use   as instructed 100 each 12  . albuterol (PROVENTIL HFA;VENTOLIN HFA) 108 (90 Base) MCG/ACT inhaler Inhale 2 puffs into the lungs every 4 (four) hours as needed for wheezing or shortness of breath. 18 g 3  . Alcohol Swabs (B-D SINGLE USE SWABS REGULAR) PADS USE TO CLEANSE SKIN DAILY BEFORE CHECKING BLOOD SUGAR 100 each 1  . atorvastatin (LIPITOR) 10 MG tablet Take 1 tablet (10 mg total) by mouth daily. 90 tablet 3  . Blood Glucose Monitoring Suppl (ACCU-CHEK AVIVA PLUS) w/Device KIT To check blood sugar once daily 1 kit 0  . clotrimazole (LOTRIMIN) 1 % cream Apply 1 application topically 2 (two) times daily. 30 g 0  . DULoxetine (CYMBALTA) 60 MG capsule Take 2 capsules (120 mg total) by mouth daily. 60 capsule 0  . fluticasone (FLONASE) 50 MCG/ACT nasal spray Place 2 sprays into both nostrils daily. 16 g 6  . folic acid (FOLVITE) 1 MG tablet Take 1 tablet (1 mg total) daily by mouth. 90 tablet 1  . furosemide (LASIX) 40 MG tablet Take 1 tablet (40 mg total) by mouth daily. 90 tablet 3  . glucose blood (ACCU-CHEK AVIVA PLUS) test strip To check blood sugar once daily 100 each 12  . hydrOXYzine (ATARAX/VISTARIL) 50 MG tablet Take 1 tablet (50 mg total) by mouth 3 (three) times daily as needed for anxiety. 90 tablet 1  . Lancets Misc. (ACCU-CHEK FASTCLIX LANCET) KIT To check blood sugar once daily 1 kit 0  . levothyroxine (SYNTHROID, LEVOTHROID) 25 MCG tablet Take 1 tablet (25 mcg total) by mouth daily  before breakfast. 90 tablet 1  . lisinopril (PRINIVIL,ZESTRIL) 20 MG tablet Take 1 tablet (20 mg total) by mouth daily. 90 tablet 3  . meloxicam (MOBIC) 15 MG tablet Take 1 tablet (15 mg total) by mouth daily. 90 tablet 1  . metFORMIN (GLUCOPHAGE) 500 MG tablet Take 1 tablet (500 mg total) by mouth 2 (two) times daily with a meal. 180 tablet 1  . Potassium Chloride ER 20 MEQ TBCR Take 1 tablet by mouth daily. 90 tablet 1  . pregabalin (LYRICA) 200 MG capsule Take 1 capsule (200 mg total) by mouth 2 (two) times daily. 180 capsule 1  . risperiDONE (RISPERDAL) 1 MG tablet Take 1 tablet (1 mg total) by mouth at bedtime. 30 tablet 1  . topiramate (TOPAMAX) 200 MG tablet Take 1 tablet (200 mg total) by mouth at bedtime. 30 tablet 1  . traZODone (DESYREL) 150 MG tablet Take 1 tablet (150 mg total) by mouth at bedtime. 30 tablet 1  . Vitamin D, Ergocalciferol, (DRISDOL) 1.25 MG (50000 UT) CAPS capsule TAKE 1 CAPSULE EVERY 7 DAYS 12 capsule 1   No current facility-administered medications on file prior to visit.     There are no Patient Instructions on file for this visit. No follow-ups on file.   Kris Hartmann, NP  This note was completed with Sales executive.  Any errors are purely unintentional.

## 2019-01-05 ENCOUNTER — Encounter: Payer: Self-pay | Admitting: Emergency Medicine

## 2019-01-05 ENCOUNTER — Other Ambulatory Visit: Payer: Self-pay

## 2019-01-05 DIAGNOSIS — F319 Bipolar disorder, unspecified: Secondary | ICD-10-CM | POA: Insufficient documentation

## 2019-01-05 DIAGNOSIS — N189 Chronic kidney disease, unspecified: Secondary | ICD-10-CM | POA: Diagnosis not present

## 2019-01-05 DIAGNOSIS — R1011 Right upper quadrant pain: Secondary | ICD-10-CM | POA: Diagnosis not present

## 2019-01-05 DIAGNOSIS — Z791 Long term (current) use of non-steroidal anti-inflammatories (NSAID): Secondary | ICD-10-CM | POA: Insufficient documentation

## 2019-01-05 DIAGNOSIS — N390 Urinary tract infection, site not specified: Secondary | ICD-10-CM | POA: Insufficient documentation

## 2019-01-05 DIAGNOSIS — R609 Edema, unspecified: Secondary | ICD-10-CM | POA: Diagnosis not present

## 2019-01-05 DIAGNOSIS — F429 Obsessive-compulsive disorder, unspecified: Secondary | ICD-10-CM | POA: Insufficient documentation

## 2019-01-05 DIAGNOSIS — Z6841 Body Mass Index (BMI) 40.0 and over, adult: Secondary | ICD-10-CM | POA: Insufficient documentation

## 2019-01-05 DIAGNOSIS — G894 Chronic pain syndrome: Secondary | ICD-10-CM | POA: Insufficient documentation

## 2019-01-05 DIAGNOSIS — I129 Hypertensive chronic kidney disease with stage 1 through stage 4 chronic kidney disease, or unspecified chronic kidney disease: Secondary | ICD-10-CM | POA: Diagnosis not present

## 2019-01-05 DIAGNOSIS — Z79899 Other long term (current) drug therapy: Secondary | ICD-10-CM | POA: Diagnosis not present

## 2019-01-05 DIAGNOSIS — F4310 Post-traumatic stress disorder, unspecified: Secondary | ICD-10-CM | POA: Insufficient documentation

## 2019-01-05 DIAGNOSIS — E1122 Type 2 diabetes mellitus with diabetic chronic kidney disease: Secondary | ICD-10-CM | POA: Insufficient documentation

## 2019-01-05 DIAGNOSIS — Z1159 Encounter for screening for other viral diseases: Secondary | ICD-10-CM | POA: Diagnosis not present

## 2019-01-05 DIAGNOSIS — Z7984 Long term (current) use of oral hypoglycemic drugs: Secondary | ICD-10-CM | POA: Diagnosis not present

## 2019-01-05 DIAGNOSIS — F419 Anxiety disorder, unspecified: Secondary | ICD-10-CM | POA: Diagnosis not present

## 2019-01-05 DIAGNOSIS — E785 Hyperlipidemia, unspecified: Secondary | ICD-10-CM | POA: Insufficient documentation

## 2019-01-05 DIAGNOSIS — K810 Acute cholecystitis: Secondary | ICD-10-CM | POA: Diagnosis not present

## 2019-01-05 DIAGNOSIS — Z03818 Encounter for observation for suspected exposure to other biological agents ruled out: Secondary | ICD-10-CM | POA: Diagnosis not present

## 2019-01-05 DIAGNOSIS — K8020 Calculus of gallbladder without cholecystitis without obstruction: Secondary | ICD-10-CM | POA: Diagnosis not present

## 2019-01-05 DIAGNOSIS — K8000 Calculus of gallbladder with acute cholecystitis without obstruction: Principal | ICD-10-CM | POA: Insufficient documentation

## 2019-01-05 DIAGNOSIS — D631 Anemia in chronic kidney disease: Secondary | ICD-10-CM | POA: Insufficient documentation

## 2019-01-05 DIAGNOSIS — N3001 Acute cystitis with hematuria: Secondary | ICD-10-CM | POA: Diagnosis not present

## 2019-01-05 LAB — CBC WITH DIFFERENTIAL/PLATELET
Abs Immature Granulocytes: 0.08 10*3/uL — ABNORMAL HIGH (ref 0.00–0.07)
Basophils Absolute: 0.1 10*3/uL (ref 0.0–0.1)
Basophils Relative: 1 %
Eosinophils Absolute: 0.6 10*3/uL — ABNORMAL HIGH (ref 0.0–0.5)
Eosinophils Relative: 6 %
HCT: 30.2 % — ABNORMAL LOW (ref 36.0–46.0)
Hemoglobin: 9.4 g/dL — ABNORMAL LOW (ref 12.0–15.0)
Immature Granulocytes: 1 %
Lymphocytes Relative: 13 %
Lymphs Abs: 1.4 10*3/uL (ref 0.7–4.0)
MCH: 27.7 pg (ref 26.0–34.0)
MCHC: 31.1 g/dL (ref 30.0–36.0)
MCV: 89.1 fL (ref 80.0–100.0)
Monocytes Absolute: 0.4 10*3/uL (ref 0.1–1.0)
Monocytes Relative: 4 %
Neutro Abs: 8.3 10*3/uL — ABNORMAL HIGH (ref 1.7–7.7)
Neutrophils Relative %: 75 %
Platelets: 201 10*3/uL (ref 150–400)
RBC: 3.39 MIL/uL — ABNORMAL LOW (ref 3.87–5.11)
RDW: 15.2 % (ref 11.5–15.5)
WBC: 10.8 10*3/uL — ABNORMAL HIGH (ref 4.0–10.5)
nRBC: 0 % (ref 0.0–0.2)

## 2019-01-05 NOTE — ED Triage Notes (Signed)
Pt to traige via w/c with no distress noted; pt reports rt upper abd pain radiating into chest with no accomp symptoms; denie hx of same

## 2019-01-06 ENCOUNTER — Observation Stay: Payer: Medicare HMO | Admitting: Certified Registered"

## 2019-01-06 ENCOUNTER — Emergency Department: Payer: Medicare HMO

## 2019-01-06 ENCOUNTER — Observation Stay
Admission: EM | Admit: 2019-01-06 | Discharge: 2019-01-07 | Disposition: A | Discharge status: Home / Self Care | Payer: Medicare HMO | Attending: Surgery | Admitting: Surgery

## 2019-01-06 ENCOUNTER — Encounter
Admission: EM | Disposition: A | Discharge status: Home / Self Care | Payer: Self-pay | Source: Home / Self Care | Attending: Emergency Medicine

## 2019-01-06 DIAGNOSIS — K8000 Calculus of gallbladder with acute cholecystitis without obstruction: Secondary | ICD-10-CM | POA: Diagnosis not present

## 2019-01-06 DIAGNOSIS — R1011 Right upper quadrant pain: Secondary | ICD-10-CM

## 2019-01-06 DIAGNOSIS — K810 Acute cholecystitis: Secondary | ICD-10-CM | POA: Diagnosis not present

## 2019-01-06 DIAGNOSIS — N390 Urinary tract infection, site not specified: Secondary | ICD-10-CM | POA: Diagnosis not present

## 2019-01-06 DIAGNOSIS — K8012 Calculus of gallbladder with acute and chronic cholecystitis without obstruction: Secondary | ICD-10-CM | POA: Diagnosis not present

## 2019-01-06 DIAGNOSIS — I10 Essential (primary) hypertension: Secondary | ICD-10-CM | POA: Diagnosis not present

## 2019-01-06 DIAGNOSIS — K8020 Calculus of gallbladder without cholecystitis without obstruction: Secondary | ICD-10-CM | POA: Diagnosis not present

## 2019-01-06 DIAGNOSIS — N3001 Acute cystitis with hematuria: Secondary | ICD-10-CM

## 2019-01-06 DIAGNOSIS — E119 Type 2 diabetes mellitus without complications: Secondary | ICD-10-CM | POA: Diagnosis not present

## 2019-01-06 HISTORY — DX: Acute cholecystitis: K81.0

## 2019-01-06 HISTORY — PX: CHOLECYSTECTOMY: SHX55

## 2019-01-06 LAB — COMPREHENSIVE METABOLIC PANEL
ALT: 14 U/L (ref 0–44)
AST: 16 U/L (ref 15–41)
Albumin: 3.7 g/dL (ref 3.5–5.0)
Alkaline Phosphatase: 104 U/L (ref 38–126)
Anion gap: 8 (ref 5–15)
BUN: 25 mg/dL — ABNORMAL HIGH (ref 6–20)
CO2: 20 mmol/L — ABNORMAL LOW (ref 22–32)
Calcium: 8.5 mg/dL — ABNORMAL LOW (ref 8.9–10.3)
Chloride: 109 mmol/L (ref 98–111)
Creatinine, Ser: 1.35 mg/dL — ABNORMAL HIGH (ref 0.44–1.00)
GFR calc Af Amer: 53 mL/min — ABNORMAL LOW (ref >60)
GFR calc non Af Amer: 46 mL/min — ABNORMAL LOW (ref >60)
Glucose, Bld: 164 mg/dL — ABNORMAL HIGH (ref 70–99)
Potassium: 4.5 mmol/L (ref 3.5–5.1)
Sodium: 137 mmol/L (ref 135–145)
Total Bilirubin: 0.2 mg/dL — ABNORMAL LOW (ref 0.3–1.2)
Total Protein: 7.1 g/dL (ref 6.5–8.1)

## 2019-01-06 LAB — GLUCOSE, CAPILLARY
Glucose-Capillary: 99 mg/dL (ref 70–99)
Glucose-Capillary: 158 mg/dL — ABNORMAL HIGH (ref 70–99)
Glucose-Capillary: 175 mg/dL — ABNORMAL HIGH (ref 70–99)

## 2019-01-06 LAB — URINALYSIS, COMPLETE (UACMP) WITH MICROSCOPIC
Bilirubin Urine: NEGATIVE
Glucose, UA: NEGATIVE mg/dL
Hgb urine dipstick: NEGATIVE
Ketones, ur: NEGATIVE mg/dL
Nitrite: POSITIVE — AB
Protein, ur: 30 mg/dL — AB
Specific Gravity, Urine: 1.025 (ref 1.005–1.030)
WBC, UA: >50 WBC/hpf — ABNORMAL HIGH (ref 0–5)
pH: 6 (ref 5.0–8.0)

## 2019-01-06 LAB — TROPONIN I: Troponin I: <0.03 ng/mL (ref <0.03)

## 2019-01-06 LAB — SARS CORONAVIRUS 2 BY RT PCR (HOSPITAL ORDER, PERFORMED IN ~~LOC~~ HOSPITAL LAB): SARS Coronavirus 2: NEGATIVE

## 2019-01-06 LAB — HEMOGLOBIN A1C
Hgb A1c MFr Bld: 7 % — ABNORMAL HIGH (ref 4.8–5.6)
Mean Plasma Glucose: 154.2 mg/dL

## 2019-01-06 LAB — LIPASE, BLOOD: Lipase: 26 U/L (ref 11–51)

## 2019-01-06 LAB — POCT PREGNANCY, URINE: Preg Test, Ur: NEGATIVE

## 2019-01-06 SURGERY — LAPAROSCOPIC CHOLECYSTECTOMY
Anesthesia: General

## 2019-01-06 MED ORDER — SODIUM CHLORIDE 0.9 % IV SOLN
2.0000 g | INTRAVENOUS | Status: AC
  Administered 2019-01-07: 2 g via INTRAVENOUS
  Filled 2019-01-06: qty 2

## 2019-01-06 MED ORDER — ONDANSETRON HCL 4 MG/2ML IJ SOLN
INTRAMUSCULAR | Status: AC
  Filled 2019-01-06: qty 2

## 2019-01-06 MED ORDER — DEXAMETHASONE SODIUM PHOSPHATE 10 MG/ML IJ SOLN
INTRAMUSCULAR | Status: DC | PRN
  Administered 2019-01-06: 8 mg via INTRAVENOUS

## 2019-01-06 MED ORDER — ACETAMINOPHEN 650 MG RE SUPP: 650.0000 mg | Freq: Four times a day (QID) | RECTAL | Status: DC | PRN

## 2019-01-06 MED ORDER — HYDROMORPHONE HCL 1 MG/ML IJ SOLN
0.2500 mg | INTRAMUSCULAR | Status: DC | PRN
  Administered 2019-01-06 (×3): 0.25 mg via INTRAVENOUS

## 2019-01-06 MED ORDER — TRAZODONE HCL 50 MG PO TABS
150.0000 mg | ORAL_TABLET | Freq: Every day | ORAL | Status: DC
  Administered 2019-01-06: 150 mg via ORAL
  Filled 2019-01-06: qty 1

## 2019-01-06 MED ORDER — ONDANSETRON HCL 4 MG/2ML IJ SOLN: 4.0000 mg | Freq: Four times a day (QID) | INTRAMUSCULAR | Status: DC | PRN

## 2019-01-06 MED ORDER — PROPOFOL 10 MG/ML IV BOLUS
INTRAVENOUS | Status: AC
  Filled 2019-01-06: qty 20

## 2019-01-06 MED ORDER — MORPHINE SULFATE (PF) 2 MG/ML IV SOLN
2.0000 mg | INTRAVENOUS | Status: DC | PRN
  Administered 2019-01-06: 2 mg via INTRAVENOUS
  Filled 2019-01-06: qty 1

## 2019-01-06 MED ORDER — DULOXETINE HCL 30 MG PO CPEP
120.0000 mg | ORAL_CAPSULE | Freq: Every day | ORAL | Status: DC
  Administered 2019-01-07: 120 mg via ORAL
  Filled 2019-01-06: qty 4

## 2019-01-06 MED ORDER — LIDOCAINE HCL (PF) 2 % IJ SOLN
INTRAMUSCULAR | Status: AC
  Filled 2019-01-06: qty 10

## 2019-01-06 MED ORDER — SODIUM CHLORIDE 0.9 % IV SOLN
1.0000 g | Freq: Once | INTRAVENOUS | Status: AC
  Administered 2019-01-06: 1 g via INTRAVENOUS
  Filled 2019-01-06: qty 10

## 2019-01-06 MED ORDER — PREGABALIN 50 MG PO CAPS
200.0000 mg | ORAL_CAPSULE | Freq: Two times a day (BID) | ORAL | Status: DC
  Administered 2019-01-06 – 2019-01-07 (×2): 200 mg via ORAL
  Filled 2019-01-06 (×2): qty 4

## 2019-01-06 MED ORDER — KETOROLAC TROMETHAMINE 30 MG/ML IJ SOLN
15.0000 mg | Freq: Once | INTRAMUSCULAR | Status: AC
  Administered 2019-01-06: 15 mg via INTRAVENOUS
  Filled 2019-01-06: qty 1

## 2019-01-06 MED ORDER — HYDROXYZINE HCL 25 MG PO TABS: 50.0000 mg | ORAL_TABLET | Freq: Three times a day (TID) | ORAL | Status: DC | PRN

## 2019-01-06 MED ORDER — ONDANSETRON 4 MG PO TBDP: 4.0000 mg | ORAL_TABLET | Freq: Four times a day (QID) | ORAL | Status: DC | PRN

## 2019-01-06 MED ORDER — HYDROMORPHONE HCL 1 MG/ML IJ SOLN
INTRAMUSCULAR | Status: AC
  Administered 2019-01-06: 0.25 mg via INTRAVENOUS
  Filled 2019-01-06: qty 1

## 2019-01-06 MED ORDER — FENTANYL CITRATE (PF) 100 MCG/2ML IJ SOLN
INTRAMUSCULAR | Status: AC
  Filled 2019-01-06: qty 2

## 2019-01-06 MED ORDER — LIDOCAINE HCL 1 % IJ SOLN
INTRAMUSCULAR | Status: DC | PRN
  Administered 2019-01-06: 20 mL via INTRAMUSCULAR

## 2019-01-06 MED ORDER — FENTANYL CITRATE (PF) 100 MCG/2ML IJ SOLN
INTRAMUSCULAR | Status: DC | PRN
  Administered 2019-01-06: 100 ug via INTRAVENOUS

## 2019-01-06 MED ORDER — HYDROMORPHONE HCL 1 MG/ML IJ SOLN
0.5000 mg | INTRAMUSCULAR | Status: DC | PRN
  Administered 2019-01-06: 0.5 mg via INTRAVENOUS
  Filled 2019-01-06: qty 1

## 2019-01-06 MED ORDER — OXYCODONE HCL 5 MG PO TABS
5.0000 mg | ORAL_TABLET | ORAL | Status: DC | PRN
  Administered 2019-01-06 – 2019-01-07 (×3): 5 mg via ORAL
  Filled 2019-01-06 (×3): qty 1

## 2019-01-06 MED ORDER — ACETAMINOPHEN 325 MG PO TABS: 650.0000 mg | ORAL_TABLET | Freq: Four times a day (QID) | ORAL | Status: DC | PRN

## 2019-01-06 MED ORDER — SODIUM CHLORIDE 0.9 % IV SOLN: 2.0000 g | INTRAVENOUS | Status: DC

## 2019-01-06 MED ORDER — ONDANSETRON HCL 4 MG/2ML IJ SOLN
4.0000 mg | Freq: Once | INTRAMUSCULAR | Status: AC
  Administered 2019-01-06: 4 mg via INTRAVENOUS
  Filled 2019-01-06: qty 2

## 2019-01-06 MED ORDER — SUCCINYLCHOLINE CHLORIDE 20 MG/ML IJ SOLN
INTRAMUSCULAR | Status: AC
  Filled 2019-01-06: qty 1

## 2019-01-06 MED ORDER — FLUTICASONE PROPIONATE 50 MCG/ACT NA SUSP
2.0000 | Freq: Every day | NASAL | Status: DC
  Filled 2019-01-06: qty 16

## 2019-01-06 MED ORDER — LIDOCAINE HCL (CARDIAC) PF 100 MG/5ML IV SOSY
PREFILLED_SYRINGE | INTRAVENOUS | Status: DC | PRN
  Administered 2019-01-06: 100 mg via INTRAVENOUS

## 2019-01-06 MED ORDER — ALBUTEROL SULFATE (2.5 MG/3ML) 0.083% IN NEBU: 3.0000 mL | INHALATION_SOLUTION | RESPIRATORY_TRACT | Status: DC | PRN

## 2019-01-06 MED ORDER — MIDAZOLAM HCL 2 MG/2ML IJ SOLN
INTRAMUSCULAR | Status: DC | PRN
  Administered 2019-01-06: 2 mg via INTRAVENOUS

## 2019-01-06 MED ORDER — HYDROMORPHONE HCL 1 MG/ML IJ SOLN
0.2500 mg | INTRAMUSCULAR | Status: AC | PRN
  Administered 2019-01-06 (×4): 0.25 mg via INTRAVENOUS

## 2019-01-06 MED ORDER — ROCURONIUM BROMIDE 100 MG/10ML IV SOLN
INTRAVENOUS | Status: DC | PRN
  Administered 2019-01-06: 40 mg via INTRAVENOUS
  Administered 2019-01-06: 10 mg via INTRAVENOUS
  Administered 2019-01-06: 5 mg via INTRAVENOUS
  Administered 2019-01-06: 10 mg via INTRAVENOUS

## 2019-01-06 MED ORDER — SUGAMMADEX SODIUM 200 MG/2ML IV SOLN
INTRAVENOUS | Status: DC | PRN
  Administered 2019-01-06: 262.2 mg via INTRAVENOUS

## 2019-01-06 MED ORDER — LEVOTHYROXINE SODIUM 50 MCG PO TABS
25.0000 ug | ORAL_TABLET | Freq: Every day | ORAL | Status: DC
  Administered 2019-01-07: 25 ug via ORAL
  Filled 2019-01-06: qty 1

## 2019-01-06 MED ORDER — DEXAMETHASONE SODIUM PHOSPHATE 10 MG/ML IJ SOLN
INTRAMUSCULAR | Status: AC
  Filled 2019-01-06: qty 1

## 2019-01-06 MED ORDER — INSULIN ASPART 100 UNIT/ML ~~LOC~~ SOLN: 0.0000 [IU] | Freq: Every day | SUBCUTANEOUS | Status: DC

## 2019-01-06 MED ORDER — LACTATED RINGERS IV SOLN
INTRAVENOUS | Status: DC | PRN
  Administered 2019-01-06 (×2): via INTRAVENOUS

## 2019-01-06 MED ORDER — SUCCINYLCHOLINE CHLORIDE 20 MG/ML IJ SOLN
INTRAMUSCULAR | Status: DC | PRN
  Administered 2019-01-06: 140 mg via INTRAVENOUS

## 2019-01-06 MED ORDER — LACTATED RINGERS IV SOLN
INTRAVENOUS | Status: DC
  Administered 2019-01-06 – 2019-01-07 (×3): via INTRAVENOUS

## 2019-01-06 MED ORDER — BUPIVACAINE HCL (PF) 0.5 % IJ SOLN
INTRAMUSCULAR | Status: AC
  Filled 2019-01-06: qty 30

## 2019-01-06 MED ORDER — ROCURONIUM BROMIDE 50 MG/5ML IV SOLN
INTRAVENOUS | Status: AC
  Filled 2019-01-06: qty 1

## 2019-01-06 MED ORDER — PHENYLEPHRINE HCL (PRESSORS) 10 MG/ML IV SOLN
INTRAVENOUS | Status: DC | PRN
  Administered 2019-01-06 (×2): 200 ug via INTRAVENOUS

## 2019-01-06 MED ORDER — ENOXAPARIN SODIUM 40 MG/0.4ML ~~LOC~~ SOLN
40.0000 mg | SUBCUTANEOUS | Status: DC
  Administered 2019-01-07: 40 mg via SUBCUTANEOUS
  Filled 2019-01-06: qty 0.4

## 2019-01-06 MED ORDER — PROPOFOL 10 MG/ML IV BOLUS
INTRAVENOUS | Status: DC | PRN
  Administered 2019-01-06: 40 mg via INTRAVENOUS
  Administered 2019-01-06: 160 mg via INTRAVENOUS

## 2019-01-06 MED ORDER — METRONIDAZOLE IN NACL 5-0.79 MG/ML-% IV SOLN
500.0000 mg | Freq: Once | INTRAVENOUS | Status: AC
  Administered 2019-01-06: 500 mg via INTRAVENOUS
  Filled 2019-01-06: qty 100

## 2019-01-06 MED ORDER — ACETAMINOPHEN 500 MG PO TABS
1000.0000 mg | ORAL_TABLET | Freq: Four times a day (QID) | ORAL | Status: DC
  Administered 2019-01-06 – 2019-01-07 (×4): 1000 mg via ORAL
  Filled 2019-01-06 (×4): qty 2

## 2019-01-06 MED ORDER — RISPERIDONE 1 MG PO TABS
1.0000 mg | ORAL_TABLET | Freq: Every day | ORAL | Status: DC
  Administered 2019-01-06: 1 mg via ORAL
  Filled 2019-01-06 (×2): qty 1

## 2019-01-06 MED ORDER — INSULIN ASPART 100 UNIT/ML ~~LOC~~ SOLN
0.0000 [IU] | Freq: Three times a day (TID) | SUBCUTANEOUS | Status: DC
  Administered 2019-01-06: 3 [IU] via SUBCUTANEOUS
  Filled 2019-01-06: qty 1

## 2019-01-06 MED ORDER — ONDANSETRON HCL 4 MG/2ML IJ SOLN
4.0000 mg | Freq: Four times a day (QID) | INTRAMUSCULAR | Status: DC | PRN
  Administered 2019-01-06: 4 mg via INTRAVENOUS

## 2019-01-06 MED ORDER — KETOROLAC TROMETHAMINE 30 MG/ML IJ SOLN
30.0000 mg | Freq: Four times a day (QID) | INTRAMUSCULAR | Status: DC
  Administered 2019-01-06 – 2019-01-07 (×4): 30 mg via INTRAVENOUS
  Filled 2019-01-06 (×3): qty 1

## 2019-01-06 MED ORDER — LIDOCAINE HCL (PF) 1 % IJ SOLN
INTRAMUSCULAR | Status: AC
  Filled 2019-01-06: qty 30

## 2019-01-06 MED ORDER — MIDAZOLAM HCL 2 MG/2ML IJ SOLN
INTRAMUSCULAR | Status: AC
  Filled 2019-01-06: qty 2

## 2019-01-06 MED ORDER — TOPIRAMATE 100 MG PO TABS
200.0000 mg | ORAL_TABLET | Freq: Every day | ORAL | Status: DC
  Filled 2019-01-06 (×3): qty 2

## 2019-01-06 SURGICAL SUPPLY — 41 items
ADH SKN CLS APL DERMABOND .7 (GAUZE/BANDAGES/DRESSINGS) ×1
APL PRP STRL LF DISP 70% ISPRP (MISCELLANEOUS) ×1
APPLIER CLIP ROT 10 11.4 M/L (STAPLE) ×3
APR CLP MED LRG 11.4X10 (STAPLE) ×1
BAG SPEC RTRVL LRG 6X4 10 (ENDOMECHANICALS) ×1
CHLORAPREP W/TINT 26 (MISCELLANEOUS) ×3 IMPLANT
CLIP APPLIE ROT 10 11.4 M/L (STAPLE) ×1 IMPLANT
COVER WAND RF STERILE (DRAPES) ×3 IMPLANT
DECANTER SPIKE VIAL GLASS SM (MISCELLANEOUS) ×6 IMPLANT
DERMABOND ADVANCED (GAUZE/BANDAGES/DRESSINGS) ×2
DERMABOND ADVANCED .7 DNX12 (GAUZE/BANDAGES/DRESSINGS) ×1 IMPLANT
DRESSING SURGICEL FIBRLLR 1X2 (HEMOSTASIS) IMPLANT
DRSG SURGICEL FIBRILLAR 1X2 (HEMOSTASIS)
ELECT REM PT RETURN 9FT ADLT (ELECTROSURGICAL) ×3
ELECTRODE REM PT RTRN 9FT ADLT (ELECTROSURGICAL) ×1 IMPLANT
GLOVE BIO SURGEON STRL SZ7 (GLOVE) ×9 IMPLANT
GLOVE BIOGEL PI IND STRL 7.5 (GLOVE) ×1 IMPLANT
GLOVE BIOGEL PI INDICATOR 7.5 (GLOVE) ×6
GOWN STRL REUS W/ TWL LRG LVL3 (GOWN DISPOSABLE) ×3 IMPLANT
GOWN STRL REUS W/TWL LRG LVL3 (GOWN DISPOSABLE) ×9
GRASPER SUT TROCAR 14GX15 (MISCELLANEOUS) ×3 IMPLANT
IRRIGATION STRYKERFLOW (MISCELLANEOUS) IMPLANT
IRRIGATOR STRYKERFLOW (MISCELLANEOUS)
IV NS 1000ML (IV SOLUTION)
IV NS 1000ML BAXH (IV SOLUTION) IMPLANT
KIT TURNOVER KIT A (KITS) ×3 IMPLANT
LABEL OR SOLS (LABEL) ×3 IMPLANT
NDL INSUFFLATION 14GA 120MM (NEEDLE) ×1 IMPLANT
NEEDLE HYPO 22GX1.5 SAFETY (NEEDLE) ×3 IMPLANT
NEEDLE INSUFFLATION 14GA 120MM (NEEDLE) ×3 IMPLANT
NS IRRIG 1000ML POUR BTL (IV SOLUTION) ×3 IMPLANT
PACK LAP CHOLECYSTECTOMY (MISCELLANEOUS) ×3 IMPLANT
POUCH SPECIMEN RETRIEVAL 10MM (ENDOMECHANICALS) ×3 IMPLANT
SCISSORS METZENBAUM CVD 33 (INSTRUMENTS) ×3 IMPLANT
SET TUBE SMOKE EVAC HIGH FLOW (TUBING) ×3 IMPLANT
SLEEVE ENDOPATH XCEL 5M (ENDOMECHANICALS) ×6 IMPLANT
SUT MNCRL AB 4-0 PS2 18 (SUTURE) ×3 IMPLANT
SUT VICRYL 0 UR6 27IN ABS (SUTURE) ×3 IMPLANT
SUT VICRYL AB 3-0 FS1 BRD 27IN (SUTURE) ×3 IMPLANT
TROCAR XCEL 12X100 BLDLESS (ENDOMECHANICALS) ×3 IMPLANT
TROCAR XCEL NON-BLD 5MMX100MML (ENDOMECHANICALS) ×3 IMPLANT

## 2019-01-06 NOTE — ED Provider Notes (Signed)
[facility] Emergency Department Provider Note  ____________________________________________  Time seen: Approximately 3:04 AM  I have reviewed the triage vital signs and the nursing notes.   HISTORY  Chief Complaint Abdominal Pain   HPI Barbara Anderson is a 48 y.o. female with a history of diabetes, hypertension, chronic pain syndrome, bipolar disorder who presents for evaluation of abdominal pain.  Patient reports 2 days of right upper quadrant abdominal pain.  Initially the pain was dull but it became sharp yesterday.  The pain is constant, severe, nonradiating, associated with nausea.  She reports that the pain is now radiating up to her chest.  The pain is worse with deep inspiration.  No chest pain or shortness of breath, no cough, no fever, no diarrhea or constipation, no melena, no dysuria. She does report urinary frequency for several days.  Patient reports that she has not had an appetite and therefore has not really been eating since the pain started.  No prior abdominal surgeries.  No personal or family history of blood clots, no recent travel immobilization, no leg pain or swelling, no hemoptysis, no exogenous hormones.  Patient reports seeing her PCP a week ago for regular checkup and she was told that she had iron deficiency anemia.  She was started on iron which she has been taking for the last 2 days.  Denies any prior history of anemia, hematuria, melena, hematochezia, coffee-ground emesis or hematemesis.  She denies heavy menstrual periods and usually bleeds 1 day a month. She denies NSAID usage or h/o PUD.  Past Medical History:  Diagnosis Date  . ADD (attention deficit disorder)   . Anxiety   . Back pain   . Concussion 2001   head trauma-work related. Caused headaches and short term memory loss.  . Depression   . Diabetes mellitus without complication ([place])   . Overactive bladder   . Recurrent vaginitis     Patient Active Problem List   Diagnosis Date Noted  . Edema of upper extremity 01/04/2019  . Lymphedema 09/03/2018  . Type 2 diabetes mellitus with microalbuminuria, without long-term current use of insulin ([place]) 07/21/2018  . Suicidal ideation 04/11/2017  . HTN (hypertension) 04/11/2017  . Chronic pain syndrome 04/11/2017  . PTSD (post-traumatic stress disorder) 04/11/2017  . OCD (obsessive compulsive disorder) 04/11/2017  . Panic disorder with agoraphobia 04/11/2017  . TBI (traumatic brain injury) ([place]) 04/11/2017  . Bipolar I disorder, current or most recent episode depressed, with psychotic features ([place]) 04/10/2017  . Difficulty concentrating 11/24/2015  . Abnormal bruising 10/26/2015  . Fibrositis 10/26/2015  . Cervical pain 10/26/2015  . Morbid obesity ([place]) 10/26/2015  . Disorder of labyrinth 10/26/2015  . Oligomenorrhea 08/30/2015  . Weight gain 08/30/2015  . Costochondritis 04/14/2015  . Dyspnea 04/14/2015  . Recurrent vaginitis   . Clinical depression 09/21/2008  . Late effect of certain complications of trauma 50/93/2671  . Anal bleeding 09/10/2008  . Headache, variant migraine, intractable 04/16/2006    Past Surgical History:  Procedure Laterality Date  . ankle pin and plates placement     right  . COSMETIC SURGERY  2005   BREASTS, ARM, ABDOMEN AFTER WEIGHT LOSS    Prior to Admission medications   Medication Sig Start Date End Date Taking? Authorizing Provider  Accu-Chek Softclix Lancets lancets Use as instructed 11/03/18   Mar Daring, PA-C  albuterol (PROVENTIL HFA;VENTOLIN HFA) 108 (90 Base) MCG/ACT inhaler Inhale 2 puffs into the lungs every 4 (four) hours as needed for  wheezing or shortness of breath. 11/03/18   Mar Daring, PA-C  Alcohol Swabs (B-D SINGLE USE SWABS REGULAR) PADS USE TO CLEANSE SKIN DAILY BEFORE CHECKING BLOOD SUGAR 11/03/18   Mar Daring, PA-C  atorvastatin (LIPITOR) 10 MG tablet Take 1 tablet (10 mg total) by mouth daily. 11/03/18   Mar Daring, PA-C  Blood Glucose Monitoring Suppl (ACCU-CHEK AVIVA PLUS) w/Device KIT To check blood sugar once daily 11/03/18   Mar Daring, PA-C  clotrimazole (LOTRIMIN) 1 % cream Apply 1 application topically 2 (two) times daily. 05/21/18   Virginia Crews, MD  DULoxetine (CYMBALTA) 60 MG capsule Take 2 capsules (120 mg total) by mouth daily. 04/15/17   Pucilowska, Jolanta B, MD  fluticasone (FLONASE) 50 MCG/ACT nasal spray Place 2 sprays into both nostrils daily. 11/03/18   Mar Daring, PA-C  folic acid (FOLVITE) 1 MG tablet Take 1 tablet (1 mg total) daily by mouth. 06/13/17   Burnette, Clearnce Sorrel, PA-C  furosemide (LASIX) 40 MG tablet Take 1 tablet (40 mg total) by mouth daily. 11/03/18   Mar Daring, PA-C  glucose blood (ACCU-CHEK AVIVA PLUS) test strip To check blood sugar once daily 11/03/18   Mar Daring, PA-C  hydrOXYzine (ATARAX/VISTARIL) 50 MG tablet Take 1 tablet (50 mg total) by mouth 3 (three) times daily as needed for anxiety. 04/15/17   Pucilowska, Wardell Honour, MD  Lancets Misc. (ACCU-CHEK FASTCLIX LANCET) KIT To check blood sugar once daily 07/21/18   Fenton Malling M, PA-C  levothyroxine (SYNTHROID, LEVOTHROID) 25 MCG tablet Take 1 tablet (25 mcg total) by mouth daily before breakfast. 11/03/18   Mar Daring, PA-C  lisinopril (PRINIVIL,ZESTRIL) 20 MG tablet Take 1 tablet (20 mg total) by mouth daily. 11/03/18   Mar Daring, PA-C  meloxicam (MOBIC) 15 MG tablet Take 1 tablet (15 mg total) by mouth daily. 11/03/18   Mar Daring, PA-C  metFORMIN (GLUCOPHAGE) 500 MG tablet Take 1 tablet (500 mg total) by mouth 2 (two) times daily with a meal. 11/11/18   Burnette, Clearnce Sorrel, PA-C  Potassium Chloride ER 20 MEQ TBCR Take 1 tablet by mouth daily. 11/11/18   Mar Daring, PA-C  pregabalin (LYRICA) 200 MG capsule Take 1 capsule (200 mg total) by mouth 2 (two) times daily. 11/11/18   Mar Daring, PA-C  risperiDONE  (RISPERDAL) 1 MG tablet Take 1 tablet (1 mg total) by mouth at bedtime. 04/15/17   Pucilowska, Herma Ard B, MD  topiramate (TOPAMAX) 200 MG tablet Take 1 tablet (200 mg total) by mouth at bedtime. 04/15/17   Pucilowska, Herma Ard B, MD  traZODone (DESYREL) 150 MG tablet Take 1 tablet (150 mg total) by mouth at bedtime. 04/15/17   Pucilowska, Wardell Honour, MD  Vitamin D, Ergocalciferol, (DRISDOL) 1.25 MG (50000 UT) CAPS capsule TAKE 1 CAPSULE EVERY 7 DAYS 11/03/18   Mar Daring, PA-C    Allergies Patient has no known allergies.  Family History  Problem Relation Age of Onset  . Diabetes Mother   . Hypertension Mother   . Depression Mother   . Diabetes Father   . Hypertension Father   . Anxiety disorder Father   . Depression Father   . ADD / ADHD Father   . Depression Sister   . Bipolar disorder Sister   . ADD / ADHD Brother     Social History Social History   Tobacco Use  . Smoking status: Never Smoker  . Smokeless tobacco: Never Used  Substance Use Topics  . Alcohol use: No    Alcohol/week: 0.0 standard drinks  . Drug use: No    Review of Systems  Constitutional: Negative for fever. Eyes: Negative for visual changes. ENT: Negative for sore throat. Neck: No neck pain  Cardiovascular: Negative for chest pain. Respiratory: Negative for shortness of breath. Gastrointestinal: + RUQ abdominal pain and nausea. No vomiting or diarrhea. Genitourinary: Negative for dysuria. Musculoskeletal: Negative for back pain. Skin: Negative for rash. Neurological: Negative for headaches, weakness or numbness. Psych: No SI or HI  ____________________________________________   PHYSICAL EXAM:  VITAL SIGNS: ED Triage Vitals [01/05/19 2330]  Enc Vitals Group     BP (!) 118/59     Pulse Rate 76     Resp 20     Temp (!) 97.5 F (36.4 C)     Temp Source Oral     SpO2 98 %     Weight 289 lb (131.1 kg)     Height _0  (1.702 m)     Head Circumference      Peak Flow      Pain Score  10     Pain Loc      Pain Edu?      Excl. in [place]?     Constitutional: Alert and oriented. Well appearing and in no apparent distress. HEENT:      Head: Normocephalic and atraumatic.         Eyes: Conjunctivae are normal. Sclera is non-icteric.       Mouth/Throat: Mucous membranes are moist.       Neck: Supple with no signs of meningismus. Cardiovascular: Regular rate and rhythm. No murmurs, gallops, or rubs. 2+ symmetrical distal pulses are present in all extremities. No JVD. Respiratory: Normal respiratory effort. Lungs are clear to auscultation bilaterally. No wheezes, crackles, or rhonchi.  Gastrointestinal: Obese, RUQ tenderness with positive Murphy's sign, and non distended with positive bowel sounds. No rebound or guarding. Genitourinary: No CVA tenderness. Musculoskeletal: Nontender with normal range of motion in all extremities. No edema, cyanosis, or erythema of extremities. Neurologic: Normal speech and language. Face is symmetric. Moving all extremities. No gross focal neurologic deficits are appreciated. Skin: Skin is warm, dry and intact. No rash noted. Psychiatric: Mood and affect are normal. Speech and behavior are normal.  ____________________________________________   LABS (all labs ordered are listed, but only abnormal results are displayed)  Labs Reviewed  CBC WITH DIFFERENTIAL/PLATELET - Abnormal; Notable for the following components:      Result Value   WBC 10.8 (*)    RBC 3.39 (*)    Hemoglobin 9.4 (*)    HCT 30.2 (*)    Neutro Abs 8.3 (*)    Eosinophils Absolute 0.6 (*)    Abs Immature Granulocytes 0.08 (*)    All other components within normal limits  COMPREHENSIVE METABOLIC PANEL - Abnormal; Notable for the following components:   CO2 20 (*)    Glucose, Bld 164 (*)    BUN 25 (*)    Creatinine, Ser 1.35 (*)    Calcium 8.5 (*)    Total Bilirubin 0.2 (*)    GFR calc non Af Amer 46 (*)    GFR calc Af Amer 53 (*)    All other components within normal  limits  URINALYSIS, COMPLETE (UACMP) WITH MICROSCOPIC - Abnormal; Notable for the following components:   Color, Urine YELLOW (*)    APPearance HAZY (*)    Protein, ur 30 (*)  Nitrite POSITIVE (*)    Leukocytes,Ua SMALL (*)    WBC, UA >50 (*)    Bacteria, UA RARE (*)    All other components within normal limits  URINE CULTURE  SARS CORONAVIRUS 2 (HOSPITAL ORDER, [place] [facility])  TROPONIN I  LIPASE, BLOOD  POCT PREGNANCY, URINE   ____________________________________________  EKG  ED ECG REPORT I, Rudene Re, the attending physician, personally viewed and interpreted this ECG.  Normal sinus rhythm, rate of 80, normal intervals, normal axis, T wave inversions in 3 and aVF with no ST elevation.  Unchanged from prior ____________________________________________  RADIOLOGY  I have personally reviewed the images performed during this visit and I agree with the Radiologist's read.   Interpretation by Radiologist:  US Renal  Result Date: 01/06/2019 CLINICAL DATA:  Right upper quadrant abdominal pain. Urinary tract infection. Question obstructing stone. EXAM: RENAL / URINARY TRACT ULTRASOUND COMPLETE COMPARISON:  None. FINDINGS: Right Kidney: Renal measurements: 11.2 x 4.0 x 4.5 cm = volume: 105 mL . Echogenicity within normal limits. No mass or hydronephrosis visualized. Left Kidney: Renal measurements: 11.8 x 4.8 x 4.7 cm = volume: 141 mL. Echogenicity within normal limits. No mass or hydronephrosis visualized. Bladder: Appears normal for degree of bladder distention. Bilateral normal ureteral jets are present. IMPRESSION: Normal bilateral renal ultrasound Electronically Signed   By: San Morelle M.D.   On: 01/06/2019 05:10   US Abdomen Limited Ruq  Result Date: 01/06/2019 CLINICAL DATA:  Right upper quadrant abdominal pain. EXAM: ULTRASOUND ABDOMEN LIMITED RIGHT UPPER QUADRANT COMPARISON:  None. FINDINGS: Gallbladder: Multiple shadowing gallstones  are present. The largest stone measures 1.7 cm. There is a nonmobile stone at the gallbladder neck. Gallbladder wall is thickened at 4.3 mm. A sonographic Percell Miller sign was present Common bile duct: Diameter: 3.1 mm, within normal limits Liver: No focal lesion identified. Within normal limits in parenchymal echogenicity. Portal vein is patent on color Doppler imaging with normal direction of blood flow towards the liver. IMPRESSION: 1. Cholelithiasis with gallbladder wall thickening and a sonographic Murphy sign, highly suggestive of acute cholecystitis. Electronically Signed   By: San Morelle M.D.   On: 01/06/2019 04:51      ____________________________________________   PROCEDURES  Procedure(s) performed: None Procedures Critical Care performed:  None ____________________________________________   INITIAL IMPRESSION / ASSESSMENT AND PLAN / ED COURSE   48 y.o. female with a history of diabetes, hypertension, chronic pain syndrome, bipolar disorder who presents for evaluation of sharp RUQ abdominal pain associated with nausea x 2 days.  Patient is in no distress, normal vital signs, abdomen is obese with right upper quadrant tenderness and positive Murphy sign, no rebound or guarding.  Labs showing mild leukocytosis, mild anemia, normal LFTs and lipase, UA positive for UTI.  Differential diagnosis including pyelonephritis versus kidney stone versus GB pathology. Will give rocephin, toradol, and zofran. Will get renal and RUQ [place]    _________________________ 5:33 AM on 01/06/2019 -----------------------------------------  Right upper quadrant ultrasound confirms acute cholecystitis.  Renal ultrasound with no evidence of obstruction.  Patient received Rocephin and Flagyl.  Will consult surgery for admission.   As part of my medical decision making, I reviewed the following data within the [place] notes reviewed and incorporated, Labs reviewed , Old chart  reviewed, Radiograph reviewed , A consult was requested and obtained from this/these consultant(s) Surgery, Notes from prior ED visits and [place] Controlled Substance Database    Pertinent labs & imaging results that  were available during my care of the patient were reviewed by me and considered in my medical decision making (see chart for details).    ____________________________________________   FINAL CLINICAL IMPRESSION(S) / ED DIAGNOSES  Final diagnoses:  RUQ abdominal pain  Acute cholecystitis  Acute cystitis with hematuria      NEW MEDICATIONS STARTED DURING THIS VISIT:  ED Discharge Orders    None       Note:  This document was prepared using Dragon voice recognition software and may include unintentional dictation errors.    Alfred Levins, [place], MD 01/06/19 [phone]

## 2019-01-06 NOTE — ED Notes (Signed)
Ultrasound at bedside

## 2019-01-06 NOTE — Anesthesia Preprocedure Evaluation (Signed)
Anesthesia Evaluation  Patient identified by MRN, date of birth, ID band Patient awake    Reviewed: Allergy & Precautions, H&P , NPO status , Patient's Chart, lab work & pertinent test results  Airway Mallampati: II  TM Distance: >3 FB    Comment: Large neck Dental  (+) Teeth Intact   Pulmonary shortness of breath,  Reports frequent bouts of bronchitis, not currently sick          Cardiovascular hypertension,      Neuro/Psych  Headaches, H/o head trauma and short term memory loss negative psych ROS   GI/Hepatic negative GI ROS, Neg liver ROS,   Endo/Other  diabetesMorbid obesity  Renal/GU      Musculoskeletal   Abdominal   Peds  Hematology negative hematology ROS (+)   Anesthesia Other Findings Past Medical History: No date: ADD (attention deficit disorder) No date: Anxiety No date: Back pain 2001: Concussion     Comment:  head trauma-work related. Caused headaches and short               term memory loss. No date: Depression No date: Diabetes mellitus without complication (HCC) No date: Overactive bladder No date: Recurrent vaginitis  Past Surgical History: No date: ankle pin and plates placement     Comment:  right 2005: COSMETIC SURGERY     Comment:  BREASTS, ARM, ABDOMEN AFTER WEIGHT LOSS  BMI    Body Mass Index:  45.26 kg/m      Reproductive/Obstetrics negative OB ROS                             Anesthesia Physical Anesthesia Plan  ASA: III  Anesthesia Plan: General ETT   Post-op Pain Management:    Induction:   PONV Risk Score and Plan: Ondansetron, Dexamethasone, Midazolam and Treatment may vary due to age or medical condition  Airway Management Planned:   Additional Equipment:   Intra-op Plan:   Post-operative Plan:   Informed Consent: I have reviewed the patients History and Physical, chart, labs and discussed the procedure including the risks,  benefits and alternatives for the proposed anesthesia with the patient or authorized representative who has indicated his/her understanding and acceptance.     Dental Advisory Given  Plan Discussed with: Anesthesiologist and CRNA  Anesthesia Plan Comments:         Anesthesia Quick Evaluation

## 2019-01-06 NOTE — ED Notes (Signed)
OR called and reported that pt would be going to surgery from the ED - OR advised that they would need to have consent signed there as this nurse did not witness conversation with surgeon

## 2019-01-06 NOTE — ED Notes (Signed)
Attempted to call report and they advised that they would return the call

## 2019-01-06 NOTE — Care Management Obs Status (Signed)
[place] NOTIFICATION   Patient Details  Name: Barbara Anderson MRN: [id number] Date of Birth: 08-07-1970   Medicare Observation Status Notification Given:  Yes    Shelbie Hutching, RN 01/06/2019, 10:06 AM

## 2019-01-06 NOTE — Progress Notes (Signed)
Dr Rosana Hoes aware of possible UTI.

## 2019-01-06 NOTE — Anesthesia Post-op Follow-up Note (Signed)
Anesthesia QCDR form completed.        

## 2019-01-06 NOTE — TOC Initial Note (Signed)
Transition of Care [facility]) - Initial/Assessment Note    Patient Details  Name: Barbara Anderson MRN: [id number] Date of Birth: 03-Jan-1971  Transition of [facility]) CM/SW Contact:    Shelbie Hutching, RN Phone Number: 01/06/2019, 10:09 AM  Clinical Narrative:                 Patient is being placed in observation for acute cholecystitis, patient will go to the OR for Lap chole this afternoon.  Patient is from home and lives alone, patient is independent in ADL's and drives.  PCP verified and no issues obtaining medications.  No discharge needs identified at this time.   Expected Discharge Plan: Home/Self Care Barriers to Discharge: Continued Medical Work up   Patient Goals and CMS Choice Patient states their goals for this hospitalization and ongoing recovery are:: To have the surgery and go home feeling better      Expected Discharge Plan and Services Expected Discharge Plan: Home/Self Care       Living arrangements for the past 2 months: Single Family Home                                      Prior Living Arrangements/Services Living arrangements for the past 2 months: Single Family Home Lives with:: Self Patient language and need for interpreter reviewed:: No        Need for Family Participation in Patient Care: No (Comment)     Criminal Activity/Legal Involvement Pertinent to Current Situation/Hospitalization: No - Comment as needed  Activities of Daily Living      Permission Sought/Granted                  Emotional Assessment Appearance:: Appears stated age Attitude/Demeanor/Rapport: Engaged Affect (typically observed): Accepting Orientation: : Oriented to Self, Oriented to Place, Oriented to Situation, Oriented to  Time Alcohol / Substance Use: Not Applicable Psych Involvement: Outpatient Provider  Admission diagnosis:  Chest and abdominal pain Patient Active Problem List   Diagnosis Date Noted  . Cholecystitis, acute 01/06/2019  . Edema of  upper extremity 01/04/2019  . Lymphedema 09/03/2018  . Type 2 diabetes mellitus with microalbuminuria, without long-term current use of insulin ([place]) 07/21/2018  . Suicidal ideation 04/11/2017  . HTN (hypertension) 04/11/2017  . Chronic pain syndrome 04/11/2017  . PTSD (post-traumatic stress disorder) 04/11/2017  . OCD (obsessive compulsive disorder) 04/11/2017  . Panic disorder with agoraphobia 04/11/2017  . TBI (traumatic brain injury) ([place]) 04/11/2017  . Bipolar I disorder, current or most recent episode depressed, with psychotic features ([place]) 04/10/2017  . Difficulty concentrating 11/24/2015  . Abnormal bruising 10/26/2015  . Fibrositis 10/26/2015  . Cervical pain 10/26/2015  . Morbid obesity ([place]) 10/26/2015  . Disorder of labyrinth 10/26/2015  . Oligomenorrhea 08/30/2015  . Weight gain 08/30/2015  . Costochondritis 04/14/2015  . Dyspnea 04/14/2015  . Recurrent vaginitis   . Clinical depression 09/21/2008  . Late effect of certain complications of trauma 95/63/8756  . Anal bleeding 09/10/2008  . Headache, variant migraine, intractable 04/16/2006   PCP:  Mar Daring, PA-C Pharmacy:   [facility] [address] [place] [address] [place] [address] Phone: [phone] Fax: [facility] [address], [place] [address] Phone: [phone] Fax: [phone]     Social Determinants of Health (SDOH) Interventions  Readmission Risk Interventions No flowsheet data found.

## 2019-01-06 NOTE — H&P (Addendum)
[facility] SURGICAL HISTORY & PHYSICAL (cpt [phone])  HISTORY OF PRESENT ILLNESS (HPI):  48 y.o. female presented to [facility] ED overnight for abdominal pain. Patient reports about a 24 hour history of RUQ sharp crampy abdominal pain which has been constant since onset. The pain does not radiate anywhere. She tried Tylenol without relief of her pain. No association with eating. She does endorse associated nausea with the abdominal pain. No fever, chills, cough, congestion, CP, SOB, emesis, or bladder or bowel changes. No history of similar pain. No previous intra-abdominal surgeries. Work up in the ED was concerning for acute cholecystitis.   General surgery is consulted by emergency medicine physician Dr Rudene Re, MD for evaluation and management of acute cholecystitis.    PAST MEDICAL HISTORY (PMH):  Past Medical History:  Diagnosis Date  . ADD (attention deficit disorder)   . Anxiety   . Back pain   . Concussion 2001   head trauma-work related. Caused headaches and short term memory loss.  . Depression   . Diabetes mellitus without complication ([place])   . Overactive bladder   . Recurrent vaginitis     Reviewed. Otherwise negative.   PAST SURGICAL HISTORY ([place]):  Past Surgical History:  Procedure Laterality Date  . ankle pin and plates placement     right  . COSMETIC SURGERY  2005   BREASTS, ARM, ABDOMEN AFTER WEIGHT LOSS    Reviewed. Otherwise negative.   MEDICATIONS:  Prior to Admission medications   Medication Sig Start Date End Date Taking? Authorizing Provider  Accu-Chek Softclix Lancets lancets Use as instructed 11/03/18  Yes Mar Daring, PA-C  Alcohol Swabs (B-D SINGLE USE SWABS REGULAR) PADS USE TO CLEANSE SKIN DAILY BEFORE CHECKING BLOOD SUGAR 11/03/18  Yes Fenton Malling M, PA-C  atorvastatin (LIPITOR) 10 MG tablet Take 1 tablet (10 mg total) by mouth daily. 11/03/18  Yes Mar Daring, PA-C  Blood Glucose Monitoring Suppl  (ACCU-CHEK AVIVA PLUS) w/Device KIT To check blood sugar once daily 11/03/18  Yes Burnette, Jennifer M, PA-C  clotrimazole (LOTRIMIN) 1 % cream Apply 1 application topically 2 (two) times daily. 05/21/18  Yes Bacigalupo, Dionne Bucy, MD  DULoxetine (CYMBALTA) 60 MG capsule Take 2 capsules (120 mg total) by mouth daily. 04/15/17  Yes Pucilowska, Jolanta B, MD  ferrous sulfate 325 (65 FE) MG tablet Take 325 mg by mouth daily with breakfast.   Yes [provider]  fluticasone (FLONASE) 50 MCG/ACT nasal spray Place 2 sprays into both nostrils daily. 11/03/18  Yes Fenton Malling M, PA-C  folic acid (FOLVITE) 1 MG tablet Take 1 tablet (1 mg total) daily by mouth. 06/13/17  Yes Burnette, Clearnce Sorrel, PA-C  glucose blood (ACCU-CHEK AVIVA PLUS) test strip To check blood sugar once daily 11/03/18  Yes Burnette, Jennifer M, PA-C  hydrOXYzine (ATARAX/VISTARIL) 50 MG tablet Take 1 tablet (50 mg total) by mouth 3 (three) times daily as needed for anxiety. 04/15/17  Yes Pucilowska, Jolanta B, MD  Lancets Misc. (ACCU-CHEK FASTCLIX LANCET) KIT To check blood sugar once daily 07/21/18  Yes Burnette, Jennifer M, PA-C  levothyroxine (SYNTHROID, LEVOTHROID) 25 MCG tablet Take 1 tablet (25 mcg total) by mouth daily before breakfast. 11/03/18  Yes Burnette, Clearnce Sorrel, PA-C  lisinopril (PRINIVIL,ZESTRIL) 20 MG tablet Take 1 tablet (20 mg total) by mouth daily. 11/03/18  Yes Mar Daring, PA-C  meloxicam (MOBIC) 15 MG tablet Take 1 tablet (15 mg total) by mouth daily. 11/03/18  Yes Mar Daring, PA-C  metFORMIN (GLUCOPHAGE) 500 MG tablet Take 1 tablet (500 mg total) by mouth 2 (two) times daily with a meal. 11/11/18  Yes Burnette, Clearnce Sorrel, PA-C  pregabalin (LYRICA) 200 MG capsule Take 1 capsule (200 mg total) by mouth 2 (two) times daily. 11/11/18  Yes Mar Daring, PA-C  risperiDONE (RISPERDAL) 1 MG tablet Take 1 tablet (1 mg total) by mouth at bedtime. 04/15/17  Yes Pucilowska, Jolanta B, MD  topiramate  (TOPAMAX) 200 MG tablet Take 1 tablet (200 mg total) by mouth at bedtime. 04/15/17  Yes Pucilowska, Jolanta B, MD  traZODone (DESYREL) 150 MG tablet Take 1 tablet (150 mg total) by mouth at bedtime. 04/15/17  Yes Pucilowska, Jolanta B, MD  Vitamin D, Ergocalciferol, (DRISDOL) 1.25 MG (50000 UT) CAPS capsule TAKE 1 CAPSULE EVERY 7 DAYS 11/03/18  Yes Fenton Malling M, PA-C  albuterol (PROVENTIL HFA;VENTOLIN HFA) 108 (90 Base) MCG/ACT inhaler Inhale 2 puffs into the lungs every 4 (four) hours as needed for wheezing or shortness of breath. 11/03/18   Mar Daring, PA-C  furosemide (LASIX) 40 MG tablet Take 1 tablet (40 mg total) by mouth daily. Patient not taking: Reported on 01/06/2019 11/03/18   Mar Daring, PA-C  Potassium Chloride ER 20 MEQ TBCR Take 1 tablet by mouth daily. Patient not taking: Reported on 01/06/2019 11/11/18   Mar Daring, PA-C     ALLERGIES:  No Known Allergies   SOCIAL HISTORY:  Social History   Socioeconomic History  . Marital status: Single    Spouse name: Not on file  . Number of children: Not on file  . Years of education: Not on file  . Highest education level: Not on file  Occupational History  . Not on file  Social Needs  . Financial resource strain: Not on file  . Food insecurity:    Worry: Not on file    Inability: Not on file  . Transportation needs:    Medical: Not on file    Non-medical: Not on file  Tobacco Use  . Smoking status: Never Smoker  . Smokeless tobacco: Never Used  Substance and Sexual Activity  . Alcohol use: No    Alcohol/week: 0.0 standard drinks  . Drug use: No  . Sexual activity: Yes    Birth control/protection: Condom  Lifestyle  . Physical activity:    Days per week: Not on file    Minutes per session: Not on file  . Stress: Not on file  Relationships  . Social connections:    Talks on phone: Not on file    Gets together: Not on file    Attends religious service: Not on file    Active member of club  or organization: Not on file    Attends meetings of clubs or organizations: Not on file    Relationship status: Not on file  . Intimate partner violence:    Fear of current or ex partner: Not on file    Emotionally abused: Not on file    Physically abused: Not on file    Forced sexual activity: Not on file  Other Topics Concern  . Not on file  Social History Narrative  . Not on file     FAMILY HISTORY:  Family History  Problem Relation Age of Onset  . Diabetes Mother   . Hypertension Mother   . Depression Mother   . Diabetes Father   . Hypertension Father   . Anxiety disorder Father   . Depression Father   .  ADD / ADHD Father   . Depression Sister   . Bipolar disorder Sister   . ADD / ADHD Brother     Otherwise negative.   REVIEW OF SYSTEMS:  Review of Systems  Constitutional: Negative for chills and fever.  HENT: Negative for congestion and sore throat.   Respiratory: Negative for cough and shortness of breath.   Cardiovascular: Negative for chest pain and palpitations.  Gastrointestinal: Positive for abdominal pain and nausea. Negative for constipation, diarrhea and vomiting.  Genitourinary: Negative for dysuria and urgency.  Neurological: Negative for dizziness and headaches.  All other systems reviewed and are negative.   VITAL SIGNS:  Temp:  [97.5 F (36.4 C)] 97.5 F (36.4 C) (06/08 2330) Pulse Rate:  [70-76] 71 (06/09 0600) Resp:  [20] 20 (06/08 2330) BP: (118-151)/(59-84) 136/72 (06/09 0600) SpO2:  [98 %-100 %] 100 % (06/09 0600) Weight:  [131.1 kg] 131.1 kg (06/08 2330)     Height: _0  (170.2 cm) Weight: 131.1 kg BMI (Calculated): 45.25   PHYSICAL EXAM:  Physical Exam Vitals signs and nursing note reviewed.  Constitutional:      General: She is not in acute distress.    Appearance: She is well-developed. She is obese. She is not ill-appearing.  HENT:     Head: Normocephalic and atraumatic.  Eyes:     General: No scleral icterus.    Extraocular  Movements: Extraocular movements intact.  Cardiovascular:     Rate and Rhythm: Normal rate and regular rhythm.     Heart sounds: Normal heart sounds. No friction rub. No gallop.   Pulmonary:     Effort: Pulmonary effort is normal. No respiratory distress.     Breath sounds: Normal breath sounds. No wheezing or rhonchi.  Abdominal:     General: Abdomen is protuberant. There is no distension.     Palpations: Abdomen is soft.     Tenderness: There is abdominal tenderness in the right upper quadrant. There is no guarding or rebound. Negative signs include Murphy's sign.     Comments: Obese, Negative Murphy's, No evidence of peritonitis  Genitourinary:    Comments: Deferred Skin:    General: Skin is warm and dry.     Coloration: Skin is not jaundiced or pale.  Neurological:     General: No focal deficit present.     Mental Status: She is alert and oriented to person, place, and time.  Psychiatric:        Mood and Affect: Mood normal.        Behavior: Behavior normal.     INTAKE/OUTPUT:  This shift: No intake/output data recorded.  Last 2 shifts: _1 @  Labs:  CBC Latest Ref Rng & Units 01/05/2019 01/01/2019 10/10/2018  WBC 4.0 - 10.5 K/uL 10.8(H) 9.5 11.0(H)  Hemoglobin 12.0 - 15.0 g/dL 9.4(L) 10.2(L) 12.2  Hematocrit 36.0 - 46.0 % 30.2(L) 31.2(L) 38.0  Platelets 150 - 400 K/uL 201 236 287   CMP Latest Ref Rng & Units 01/05/2019 01/01/2019 10/10/2018  Glucose 70 - 99 mg/dL 164(H) 179(H) 148(H)  BUN 6 - 20 mg/dL 25(H) 31(H) 15  Creatinine 0.44 - 1.00 mg/dL 1.35(H) 1.64(H) 1.30(H)  Sodium 135 - 145 mmol/L 137 140 145(H)  Potassium 3.5 - 5.1 mmol/L 4.5 4.2 4.2  Chloride 98 - 111 mmol/L 109 104 106  CO2 22 - 32 mmol/L 20(L) 18(L) 19(L)  Calcium 8.9 - 10.3 mg/dL 8.5(L) 8.9 9.3  Total Protein 6.5 - 8.1 g/dL 7.1 - 7.3  Total Bilirubin 0.3 -  1.2 mg/dL 0.2(L) - 0.3  Alkaline Phos 38 - 126 U/L 104 - 117  AST 15 - 41 U/L 16 - 17  ALT 0 - 44 U/L 14 - 13     Imaging studies:    RUQ [place] (01/06/2019) personally reviewed and radiologist report reviewed:  IMPRESSION: 1. Cholelithiasis with gallbladder wall thickening and a sonographic Murphy sign, highly suggestive of acute cholecystitis.    Assessment/Plan: (ICD-10's: K81.0) 48 y.o. female with RUQ abdominal pain and slight leukocytosis attributable to acute cholecystitis, complicated by pertinent comorbidities including extensive psychiatric history (history of SI, PTSD, OCD, bipolar), DM, HTN, HLD, CKD, and morbid obesity.    - Admit to general surgery  - NPO + IVF + IV ABx( Rocephin + Flagyl)  - pain control prn; antiemetics prn  - Monitor abdominal pain  - Will plan on Laparoscopic Cholecystectomy today with Dr Rosana Hoes pending OR/Anesthesia availability  - All risks, benefits, and alternatives to the above procedure(s) were discussed with the patient, all of her questions were answered to her expressed satisfaction, patient expresses she wishes to proceed, and informed consent was obtained.   - Medical management of comorbidities; home medications post-op   - Mobilization encouraged   - DVT prophylaxis; hold for surgery  All of the above findings and recommendations were discussed with the patient, and all of her questions were answered to her expressed satisfaction.  -- Edison Simon, PA-C [facility] 01/06/2019, 7:48 AM [phone] M-F: 7am - 4pm

## 2019-01-06 NOTE — Anesthesia Procedure Notes (Signed)
Procedure Name: Intubation Date/Time: 01/06/2019 11:56 AM Performed by: Rona Ravens, CRNA Pre-anesthesia Checklist: Patient identified, Emergency Drugs available and Suction available Patient Re-evaluated:Patient Re-evaluated prior to induction Oxygen Delivery Method: Circle system utilized Preoxygenation: Pre-oxygenation with 100% oxygen Induction Type: IV induction and Rapid sequence Ventilation: Mask ventilation without difficulty Laryngoscope Size: Mac and 4 Grade View: Grade II Tube type: Oral Tube size: 7.0 mm Number of attempts: 1 Placement Confirmation: ETT inserted through vocal cords under direct vision,  positive ETCO2,  CO2 detector and breath sounds checked- equal and bilateral Secured at: 22 cm Tube secured with: Tape Dental Injury: Teeth and Oropharynx as per pre-operative assessment

## 2019-01-06 NOTE — Progress Notes (Signed)
Patient is a 48 y/o F presenting to the ED with 2 days of RUQ pain.  Labs and imaging are consistent with acute calculous cholecystitis.  Will admit for IVF, abx and planned cholecystectomy pending OR and anesthesia availability. Full H&P to follow.

## 2019-01-06 NOTE — Transfer of Care (Signed)
Immediate Anesthesia Transfer of Care Note  Patient: Barbara Anderson  Procedure(s) Performed: LAPAROSCOPIC CHOLECYSTECTOMY (N/A )  Patient Location: PACU  Anesthesia Type:General  Level of Consciousness: awake, alert  and oriented  Airway & Oxygen Therapy: Patient Spontanous Breathing and Patient connected to face mask oxygen  Post-op Assessment: Post -op Vital signs reviewed and stable  Post vital signs: Reviewed and stable  Last Vitals:  Vitals Value Taken Time  BP 133/60 01/06/2019  2:26 PM  Temp    Pulse 91 01/06/2019  2:28 PM  Resp 20 01/06/2019  2:29 PM  SpO2 100 % 01/06/2019  2:28 PM  Vitals shown include unvalidated device data.  Last Pain:  Vitals:   01/06/19 1125  TempSrc: Temporal  PainSc: 7          Complications: No apparent anesthesia complications

## 2019-01-06 NOTE — ED Notes (Signed)
OR here to take pt to surgery

## 2019-01-06 NOTE — Op Note (Signed)
SURGICAL OPERATIVE REPORT   DATE OF PROCEDURE: 01/06/2019  ATTENDING Surgeon(s): Vickie Epley, MD  ANESTHESIA: GETA  PRE-OPERATIVE DIAGNOSIS: Acute calculous cholecystitis (K80.00)  POST-OPERATIVE DIAGNOSIS: Acute calculous cholecystitis (K80.00)  PROCEDURE(S): (cpt's: 16010) 1.) Laparoscopic Cholecystectomy  INTRAOPERATIVE FINDINGS: Moderately severe pericholecystic inflammation with cystic duct and cystic artery clips well-secured, hemostasis at completion of procedure  INTRAOPERATIVE FLUIDS: 1100 mL crystalloid   ESTIMATED BLOOD LOSS: 30 mL  URINE OUTPUT: No foley  SPECIMENS: Gallbladder  IMPLANTS: None  DRAINS: None   COMPLICATIONS: None apparent   CONDITION AT COMPLETION: Hemodynamically stable and extubated  DISPOSITION: PACU   INDICATION(S) FOR PROCEDURE:  Patient is a 48 y.o. female who this admission presented with persistent RUQ > epigastric abdominal. Ultrasound suggested acute calculous cholecystitis. All risks, benefits, and alternatives to above elective procedures were discussed with the patient, who elected to proceed, and informed consent was accordingly obtained at that time.   DETAILS OF PROCEDURE:  Patient was brought to the operating suite and appropriately identified. General anesthesia was administered along with peri-operative prophylactic IV antibiotics, and endotracheal intubation was performed by anesthesiologist. In supine position, operative site was prepped and draped in usual sterile fashion, and following a brief time out, initial 5 mm incision was made in a natural skin crease just above the umbilicus (though with awareness of patient's distorted anatomy due to prior abdominoplasty). Fascia was then elevated, and a Verress needle was inserted and its proper position confirmed using aspiration and saline meniscus test.  Upon insufflation of the abdominal cavity with carbon dioxide to a well-tolerated pressure of 12-15 mmHg, 5 mm  peri-umbilical port followed by laparoscope were inserted and used to inspect the abdominal cavity and its contents with no injuries from insertion of the first trochar noted. Three additional trocars were inserted, one at the epigastric position (10 mm) and two along the Right costal margin (5 mm). The table was then placed in reverse Trendelenburg position with the Right side up. Filmy adhesions between the gallbladder and omentum/duodenum/transverse colon were lysed using combined blunt dissection and selective electrocautery. Needle decompression of the gallbladder was performed to facilitate grasping of the apex/dome of the gallbladder with an atraumatic grasper passed through the lateral port and retracted apically over the liver. The infundibulum was also grasped and retracted, somewhat exposing Calot's triangle, though additionally obscured by layers of adipose tissue corresponding with patient's body habitus. The peritoneum overlying the gallbladder infundibulum was incised and dissected free of surrounding peritoneal attachments, revealing first the cystic artery, which was which was clipped twice on the patient side and once on the gallbladder specimen side close to the gallbladder. This then revealed what appeared to be the cystic duct and a posterior branch of the cystic artery, which was confirmed to join the anterior cystic arterial branch prior to clipping and dividing each individually. The gallbladder was then dissected from its peritoneal attachments to the liver using electrocautery, and the gallbladder was placed into a laparoscopic specimen bag and removed from the abdominal cavity via the epigastric port site. Hemostasis and secure placement of clips were confirmed, and intra-peritoneal cavity was inspected with no additional findings. PMI laparoscopic fascial closure device was then used to re-approximate fascia at the 10 mm epigastric port site.  All ports were then removed under direct  visualization, and abdominal cavity was desuflated. All port sites were irrigated/cleaned, additional local anesthetic was injected at each incision, 3-0 Vicryl was used to re-approximate dermis at 10 mm port site(s), and subcuticular 4-0  Monocryl suture was used to re-approximate skin. Skin was then cleaned, dried, and sterile skin glue was applied. Patient was then safely able to be awakened, extubated, and transferred to PACU for post-operative monitoring and care.   I was present for all aspects of the above procedure, and no operative complications were apparent.

## 2019-01-06 NOTE — ED Notes (Signed)
ED TO INPATIENT HANDOFF REPORT  ED Nurse Name and Phone #: Bascom Levels Name/Age/Gender Barbara Anderson 48 y.o. female Room/Bed: ED16A/ED16A  Code Status   Code Status: Full Code  Home/SNF/Other Home Patient oriented to: self, place, time and situation Is this baseline? Yes   Triage Complete: Triage complete  Chief Complaint Chest and abdominal pain  Triage Note Pt to traige via w/c with no distress noted; pt reports rt upper abd pain radiating into chest with no accomp symptoms; denie hx of same   Allergies No Known Allergies  Level of Care/Admitting Diagnosis ED Disposition    ED Disposition Condition [facility] Area: [place] [100120]  Level of Care: Med-Surg [16]  Covid Evaluation: Screening Protocol (No Symptoms)  Diagnosis: Cholecystitis, acute [323557]  Admitting Physician: Darreld Mclean  Attending Physician: Fredirick Maudlin [[id number]]  Bed request comments: 2C, if available  PT Class (Do Not Modify): Observation [104]  PT Acc Code (Do Not Modify): Observation [10022]       B Medical/Surgery History Past Medical History:  Diagnosis Date  . ADD (attention deficit disorder)   . Anxiety   . Back pain   . Concussion 2001   head trauma-work related. Caused headaches and short term memory loss.  . Depression   . Diabetes mellitus without complication ([place])   . Overactive bladder   . Recurrent vaginitis    Past Surgical History:  Procedure Laterality Date  . ankle pin and plates placement     right  . COSMETIC SURGERY  2005   BREASTS, ARM, ABDOMEN AFTER WEIGHT LOSS     A IV Location/Drains/Wounds Patient Lines/Drains/Airways Status   Active Line/Drains/Airways    Name:   Placement date:   Placement time:   Site:   Days:   Peripheral IV 01/06/19 Left Antecubital   01/06/19    0407    Antecubital   less than 1          Intake/Output Last 24 hours No intake or output data in the 24 hours ending  01/06/19 3220  Labs/Imaging Results for orders placed or performed during the hospital encounter of 01/06/19 (from the past 48 hour(s))  CBC with Differential     Status: Abnormal   Collection Time: 01/05/19 11:32 PM  Result Value Ref Range   WBC 10.8 (H) 4.0 - 10.5 K/uL   RBC 3.39 (L) 3.87 - 5.11 MIL/uL   Hemoglobin 9.4 (L) 12.0 - 15.0 g/dL   HCT 30.2 (L) 36.0 - 46.0 %   MCV 89.1 80.0 - 100.0 fL   MCH 27.7 26.0 - 34.0 pg   MCHC 31.1 30.0 - 36.0 g/dL   RDW 15.2 11.5 - 15.5 %   Platelets 201 150 - 400 K/uL   nRBC 0.0 0.0 - 0.2 %   Neutrophils Relative % 75 %   Neutro Abs 8.3 (H) 1.7 - 7.7 K/uL   Lymphocytes Relative 13 %   Lymphs Abs 1.4 0.7 - 4.0 K/uL   Monocytes Relative 4 %   Monocytes Absolute 0.4 0.1 - 1.0 K/uL   Eosinophils Relative 6 %   Eosinophils Absolute 0.6 (H) 0.0 - 0.5 K/uL   Basophils Relative 1 %   Basophils Absolute 0.1 0.0 - 0.1 K/uL   Immature Granulocytes 1 %   Abs Immature Granulocytes 0.08 (H) 0.00 - 0.07 K/uL    Comment: Performed at [facility], [address], [place], [place] [address]  Comprehensive metabolic panel  Status: Abnormal   Collection Time: 01/05/19 11:32 PM  Result Value Ref Range   Sodium 137 135 - 145 mmol/L   Potassium 4.5 3.5 - 5.1 mmol/L   Chloride 109 98 - 111 mmol/L   CO2 20 (L) 22 - 32 mmol/L   Glucose, Bld 164 (H) 70 - 99 mg/dL   BUN 25 (H) 6 - 20 mg/dL   Creatinine, Ser 1.35 (H) 0.44 - 1.00 mg/dL   Calcium 8.5 (L) 8.9 - 10.3 mg/dL   Total Protein 7.1 6.5 - 8.1 g/dL   Albumin 3.7 3.5 - 5.0 g/dL   AST 16 15 - 41 U/L   ALT 14 0 - 44 U/L   Alkaline Phosphatase 104 38 - 126 U/L   Total Bilirubin 0.2 (L) 0.3 - 1.2 mg/dL   GFR calc non Af Amer 46 (L) >60 mL/min   GFR calc Af Amer 53 (L) >60 mL/min   Anion gap 8 5 - 15    Comment: Performed at [facility], [address] [place], [place] [address]  Troponin I - ONCE - STAT     Status: None   Collection Time: 01/05/19 11:32 PM  Result Value Ref Range    Troponin I <0.03 <0.03 ng/mL    Comment: Performed at [facility], [address] [place], [place] [address]  Lipase, blood     Status: None   Collection Time: 01/05/19 11:32 PM  Result Value Ref Range   Lipase 26 11 - 51 U/L    Comment: Performed at [facility], [address] [place], [place] [address]  Urinalysis, Complete w Microscopic     Status: Abnormal   Collection Time: 01/06/19  2:44 AM  Result Value Ref Range   Color, Urine YELLOW (A) YELLOW   APPearance HAZY (A) CLEAR   Specific Gravity, Urine 1.025 1.005 - 1.030   pH 6.0 5.0 - 8.0   Glucose, UA NEGATIVE NEGATIVE mg/dL   Hgb urine dipstick NEGATIVE NEGATIVE   Bilirubin Urine NEGATIVE NEGATIVE   Ketones, ur NEGATIVE NEGATIVE mg/dL   Protein, ur 30 (A) NEGATIVE mg/dL   Nitrite POSITIVE (A) NEGATIVE   Leukocytes,Ua SMALL (A) NEGATIVE   RBC / HPF 0-5 0 - 5 RBC/hpf   WBC, UA >50 (H) 0 - 5 WBC/hpf   Bacteria, UA RARE (A) NONE SEEN   Squamous Epithelial / LPF 0-5 0 - 5   Mucus PRESENT    Hyaline Casts, UA PRESENT     Comment: Performed at [facility], [address] [place], [place] [address]  Pregnancy, urine POC     Status: None   Collection Time: 01/06/19  2:47 AM  Result Value Ref Range   Preg Test, Ur NEGATIVE NEGATIVE    Comment:        THE SENSITIVITY OF THIS METHODOLOGY IS >24 mIU/mL    US Renal  Result Date: 01/06/2019 CLINICAL DATA:  Right upper quadrant abdominal pain. Urinary tract infection. Question obstructing stone. EXAM: RENAL / URINARY TRACT ULTRASOUND COMPLETE COMPARISON:  None. FINDINGS: Right Kidney: Renal measurements: 11.2 x 4.0 x 4.5 cm = volume: 105 mL . Echogenicity within normal limits. No mass or hydronephrosis visualized. Left Kidney: Renal measurements: 11.8 x 4.8 x 4.7 cm = volume: 141 mL. Echogenicity within normal limits. No mass or hydronephrosis visualized. Bladder: Appears normal for degree of bladder distention. Bilateral normal ureteral jets are  present. IMPRESSION: Normal bilateral renal ultrasound Electronically Signed   By: San Morelle  M.D.   On: 01/06/2019 05:10   US Abdomen Limited Ruq  Result Date: 01/06/2019 CLINICAL DATA:  Right upper quadrant abdominal pain. EXAM: ULTRASOUND ABDOMEN LIMITED RIGHT UPPER QUADRANT COMPARISON:  None. FINDINGS: Gallbladder: Multiple shadowing gallstones are present. The largest stone measures 1.7 cm. There is a nonmobile stone at the gallbladder neck. Gallbladder wall is thickened at 4.3 mm. A sonographic Percell Miller sign was present Common bile duct: Diameter: 3.1 mm, within normal limits Liver: No focal lesion identified. Within normal limits in parenchymal echogenicity. Portal vein is patent on color Doppler imaging with normal direction of blood flow towards the liver. IMPRESSION: 1. Cholelithiasis with gallbladder wall thickening and a sonographic Murphy sign, highly suggestive of acute cholecystitis. Electronically Signed   By: San Morelle M.D.   On: 01/06/2019 04:51    Pending Labs Unresulted Labs (From admission, onward)    Start     Ordered   01/06/19 0550  Hemoglobin A1c  Once,   STAT    Comments:  To assess prior glycemic control    01/06/19 0550   01/06/19 0544  HIV antibody (Routine Testing)  Once,   STAT     01/06/19 0547   01/06/19 0510  SARS Coronavirus 2 (CEPHEID - Performed in [facility] hospital lab), Hosp Order  (Asymptomatic Patients Labs)  Once,   STAT    Question:  Rule Out  Answer:  Yes   01/06/19 0509   01/06/19 0310  Urine Culture  Add-on,   AD     01/06/19 0310          Vitals/Pain Today's Vitals   01/06/19 0315 01/06/19 0330 01/06/19 0530 01/06/19 0600  BP:  137/76 140/70 136/72  Pulse: 70 72  71  Resp:      Temp:      TempSrc:      SpO2: 100% 100%  100%  Weight:      Height:      PainSc:        Isolation Precautions No active isolations  Medications Medications  metroNIDAZOLE (FLAGYL) IVPB 500 mg (500 mg Intravenous New Bag/Given  01/06/19 0550)  cefTRIAXone (ROCEPHIN) 2 g in sodium chloride 0.9 % 100 mL IVPB (has no administration in time range)  acetaminophen (TYLENOL) tablet 650 mg (has no administration in time range)    Or  acetaminophen (TYLENOL) suppository 650 mg (has no administration in time range)  HYDROmorphone (DILAUDID) injection 0.5 mg (has no administration in time range)  ondansetron (ZOFRAN-ODT) disintegrating tablet 4 mg (has no administration in time range)    Or  ondansetron (ZOFRAN) injection 4 mg (has no administration in time range)  cefTRIAXone (ROCEPHIN) 1 g in sodium chloride 0.9 % 100 mL IVPB (has no administration in time range)  DULoxetine (CYMBALTA) DR capsule 120 mg (has no administration in time range)  hydrOXYzine (ATARAX/VISTARIL) tablet 50 mg (has no administration in time range)  risperiDONE (RISPERDAL) tablet 1 mg (has no administration in time range)  traZODone (DESYREL) tablet 150 mg (has no administration in time range)  levothyroxine (SYNTHROID) tablet 25 mcg (has no administration in time range)  pregabalin (LYRICA) capsule 200 mg (has no administration in time range)  topiramate (TOPAMAX) tablet 200 mg (has no administration in time range)  albuterol (PROVENTIL) (2.5 MG/3ML) 0.083% nebulizer solution 3 mL (has no administration in time range)  fluticasone (FLONASE) 50 MCG/ACT nasal spray 2 spray (has no administration in time range)  insulin aspart (novoLOG) injection 0-15 Units (has no administration in time range)  insulin aspart (novoLOG) injection 0-5 Units (has no administration in time range)  ketorolac (TORADOL) 30 MG/ML injection 15 mg (15 mg Intravenous Given 01/06/19 0347)  ondansetron (ZOFRAN) injection 4 mg (4 mg Intravenous Given 01/06/19 0347)  cefTRIAXone (ROCEPHIN) 1 g in sodium chloride 0.9 % 100 mL IVPB (0 g Intravenous Stopped 01/06/19 0525)    Mobility walks Low fall risk   Focused Assessments Pulmonary Assessment Handoff:  Lung sounds:   O2 Device: Room  Air        R Recommendations: See Admitting Provider Note  Report given to:   Additional Notes:

## 2019-01-07 ENCOUNTER — Encounter: Payer: Self-pay | Admitting: Surgery

## 2019-01-07 LAB — COMPREHENSIVE METABOLIC PANEL
ALT: 35 U/L (ref 0–44)
AST: 35 U/L (ref 15–41)
Albumin: 3.1 g/dL — ABNORMAL LOW (ref 3.5–5.0)
Alkaline Phosphatase: 90 U/L (ref 38–126)
Anion gap: 7 (ref 5–15)
BUN: 24 mg/dL — ABNORMAL HIGH (ref 6–20)
CO2: 20 mmol/L — ABNORMAL LOW (ref 22–32)
Calcium: 7.7 mg/dL — ABNORMAL LOW (ref 8.9–10.3)
Chloride: 112 mmol/L — ABNORMAL HIGH (ref 98–111)
Creatinine, Ser: 1.36 mg/dL — ABNORMAL HIGH (ref 0.44–1.00)
GFR calc Af Amer: 53 mL/min — ABNORMAL LOW (ref >60)
GFR calc non Af Amer: 46 mL/min — ABNORMAL LOW (ref >60)
Glucose, Bld: 138 mg/dL — ABNORMAL HIGH (ref 70–99)
Potassium: 4.8 mmol/L (ref 3.5–5.1)
Sodium: 139 mmol/L (ref 135–145)
Total Bilirubin: 0.4 mg/dL (ref 0.3–1.2)
Total Protein: 6.4 g/dL — ABNORMAL LOW (ref 6.5–8.1)

## 2019-01-07 LAB — GLUCOSE, CAPILLARY
Glucose-Capillary: 92 mg/dL (ref 70–99)
Glucose-Capillary: 94 mg/dL (ref 70–99)

## 2019-01-07 LAB — HIV ANTIBODY (ROUTINE TESTING W REFLEX): HIV Screen 4th Generation wRfx: NONREACTIVE

## 2019-01-07 LAB — CBC
HCT: 27.6 % — ABNORMAL LOW (ref 36.0–46.0)
Hemoglobin: 8.5 g/dL — ABNORMAL LOW (ref 12.0–15.0)
MCH: 27.2 pg (ref 26.0–34.0)
MCHC: 30.8 g/dL (ref 30.0–36.0)
MCV: 88.5 fL (ref 80.0–100.0)
Platelets: 211 10*3/uL (ref 150–400)
RBC: 3.12 MIL/uL — ABNORMAL LOW (ref 3.87–5.11)
RDW: 15.6 % — ABNORMAL HIGH (ref 11.5–15.5)
WBC: 9.1 10*3/uL (ref 4.0–10.5)
nRBC: 0 % (ref 0.0–0.2)

## 2019-01-07 MED ORDER — OXYCODONE HCL 5 MG PO TABS: 5.0000 mg | ORAL_TABLET | Freq: Four times a day (QID) | ORAL | 0 refills | Status: DC | PRN

## 2019-01-07 MED ORDER — CIPROFLOXACIN HCL 500 MG PO TABS: 500.0000 mg | ORAL_TABLET | Freq: Two times a day (BID) | ORAL | 0 refills | Status: AC

## 2019-01-07 NOTE — Anesthesia Postprocedure Evaluation (Signed)
Anesthesia Post Note  Patient: [name]  Procedure(s) Performed: LAPAROSCOPIC CHOLECYSTECTOMY (N/A )  Patient location during evaluation: PACU Anesthesia Type: General Level of consciousness: awake and alert Pain management: pain level controlled Vital Signs Assessment: post-procedure vital signs reviewed and stable Respiratory status: spontaneous breathing, nonlabored ventilation and respiratory function stable Cardiovascular status: blood pressure returned to baseline and stable Postop Assessment: no apparent nausea or vomiting Anesthetic complications: no     Last Vitals:  Vitals:   01/07/19 0421 01/07/19 1148  BP: 117/75 129/80  Pulse: 82 88  Resp: 18 17  Temp: 36.6 C 36.7 C  SpO2: 99% 100%    Last Pain:  Vitals:   01/07/19 1148  TempSrc: Oral  PainSc:                  Barbara Anderson

## 2019-01-07 NOTE — Discharge Summary (Signed)
[name] MRN: [id number] DOB/AGE: 1971/04/12 48 y.o.  Admit date: 01/06/2019 Discharge date: 01/07/2019  Discharge Diagnoses Acute Cholecystitis  Consultants None  Procedures 01/06/2019:  Laparoscopic Cholecystectomy   HPI: 48 y.o. female presented to [facility] ED overnight for abdominal pain. Patient reports about a 24 hour history of RUQ sharp crampy abdominal pain which has been constant since onset. The pain does not radiate anywhere. She tried Tylenol without relief of her pain. No association with eating. She does endorse associated nausea with the abdominal pain. No fever, chills, cough, congestion, CP, SOB, emesis, or bladder or bowel changes. No history of similar pain. No previous intra-abdominal surgeries. Work up in the ED was concerning for acute cholecystitis.   Hospital Course: Informed consent was obtained and documented, and patient underwent uneventful laparoscopic cholecytectomy (Dr Rosana Hoes, 01/06/2019).  Post-operatively, patient's pain improved/resolved and advancement of patient's diet and ambulation were well-tolerated. The remainder of patient's hospital course was essentially unremarkable, and discharge planning was initiated accordingly with patient safely able to be discharged home with appropriate discharge instructions, antibiotics Cipro x1 day, to complete 3 days for uncomplicated UTI), pain control, and outpatient follow-up after all of her questions were answered to her expressed satisfaction.  Discharge Condition: Good   Physical Examination:  Constitutional: Well appearing female, NAD HEENT: No scleral icterus Pulmonary: Normal effort, no respiratory distress Gastrointestinal: Obese, soft, mild incisional soreness at epigastric incision, non-distended. No rebound/guarding Skin: Laparoscopic incisions are CID with skin glue, no erythema or drainage   Allergies as of 01/07/2019   No Known  Allergies     Medication List    TAKE these medications   Accu-Chek Aviva Plus w/Device Kit To check blood sugar once daily   Accu-Chek FastClix Lancet Kit To check blood sugar once daily   Accu-Chek Softclix Lancets lancets Use as instructed   albuterol 108 (90 Base) MCG/ACT inhaler Commonly known as:  VENTOLIN HFA Inhale 2 puffs into the lungs every 4 (four) hours as needed for wheezing or shortness of breath.   atorvastatin 10 MG tablet Commonly known as:  LIPITOR Take 1 tablet (10 mg total) by mouth daily.   B-D SINGLE USE SWABS REGULAR Pads USE TO CLEANSE SKIN DAILY BEFORE CHECKING BLOOD SUGAR   ciprofloxacin 500 MG tablet Commonly known as:  Cipro Take 1 tablet (500 mg total) by mouth 2 (two) times daily for 1 day.   clotrimazole 1 % cream Commonly known as:  LOTRIMIN Apply 1 application topically 2 (two) times daily.   DULoxetine 60 MG capsule Commonly known as:  Cymbalta Take 2 capsules (120 mg total) by mouth daily.   ferrous sulfate 325 (65 FE) MG tablet Take 325 mg by mouth daily with breakfast.   fluticasone 50 MCG/ACT nasal spray Commonly known as:  FLONASE Place 2 sprays into both nostrils daily.   folic acid 1 MG tablet Commonly known as:  FOLVITE Take 1 tablet (1 mg total) daily by mouth.   furosemide 40 MG tablet Commonly known as:  LASIX Take 1 tablet (40 mg total) by mouth daily.   glucose blood test strip Commonly known as:  Accu-Chek Aviva Plus To check blood sugar once daily   hydrOXYzine 50 MG tablet Commonly known as:  ATARAX/VISTARIL Take 1 tablet (50 mg total) by mouth 3 (three) times daily as needed for anxiety.   levothyroxine 25 MCG tablet Commonly known as:  SYNTHROID Take 1 tablet (25 mcg total) by mouth daily  before breakfast.   lisinopril 20 MG tablet Commonly known as:  ZESTRIL Take 1 tablet (20 mg total) by mouth daily.   meloxicam 15 MG tablet Commonly known as:  MOBIC Take 1 tablet (15 mg total) by mouth  daily.   metFORMIN 500 MG tablet Commonly known as:  GLUCOPHAGE Take 1 tablet (500 mg total) by mouth 2 (two) times daily with a meal.   oxyCODONE 5 MG immediate release tablet Commonly known as:  Oxy IR/ROXICODONE Take 1 tablet (5 mg total) by mouth every 6 (six) hours as needed for severe pain or breakthrough pain.   Potassium Chloride ER 20 MEQ Tbcr Take 1 tablet by mouth daily.   pregabalin 200 MG capsule Commonly known as:  LYRICA Take 1 capsule (200 mg total) by mouth 2 (two) times daily.   risperiDONE 1 MG tablet Commonly known as:  RISPERDAL Take 1 tablet (1 mg total) by mouth at bedtime.   topiramate 200 MG tablet Commonly known as:  TOPAMAX Take 1 tablet (200 mg total) by mouth at bedtime.   traZODone 150 MG tablet Commonly known as:  DESYREL Take 1 tablet (150 mg total) by mouth at bedtime.   Vitamin D (Ergocalciferol) 1.25 MG (50000 UT) Caps capsule Commonly known as:  DRISDOL TAKE 1 CAPSULE EVERY 7 DAYS        Follow-up Information    Vickie Epley, MD. Schedule an appointment as soon as possible for a visit in 2 week(s).   Specialty:  General Surgery Why:  s/p laparoscopic cholecystectomy Contact information: [address] [place] [address] [phone]            Time spent on discharge management including discussion of hospital course, clinical condition, outpatient instructions, prescriptions, and follow up with the patient and members of the medical team: >30 minutes  -- Edison Simon , PA-C [facility]  01/07/2019, 8:17 AM [phone] M-F: 7am - 4pm

## 2019-01-07 NOTE — Discharge Instructions (Signed)
In addition to included general post-operative instructions for laparoscopic cholecystectomy,  Diet: Resume home heart healthy diet.   Activity: No heavy lifting >20 pounds (children, pets, laundry, garbage) or strenuous activity until follow-up in 2 weeks, but light activity and walking are encouraged. Do not drive or drink alcohol if taking narcotic pain medications.  Wound care: 2 days after surgery (06/11), you may shower/get incision wet with soapy water and pat dry (do not rub incisions), but no baths or submerging incision underwater until follow-up.   Medications: Resume all home medications except. For mild to moderate pain: acetaminophen (Tylenol) or ibuprofen/naproxen (if no kidney disease). Combining Tylenol with alcohol can substantially increase your risk of causing liver disease. Narcotic pain medications, if prescribed, can be used for severe pain, though may cause nausea, constipation, and drowsiness. Do not combine Tylenol and Percocet (or similar) within a 6 hour period as Percocet (and similar) contain(s) Tylenol. If you do not need the narcotic pain medication, you do not need to fill the prescription.  Call office [phone] / [phone]) at any time if any questions, worsening pain, fevers/chills, bleeding, drainage from incision site, or other concerns.

## 2019-01-07 NOTE — Progress Notes (Signed)
RN paged on call hospitalist to inform of abnormal lab values RBC 3.12 HMG 8.5 HCT 27.6 RDW 15.6

## 2019-01-08 LAB — SURGICAL PATHOLOGY

## 2019-01-08 LAB — URINE CULTURE: Culture: >=100000 — AB

## 2019-01-22 ENCOUNTER — Telehealth (INDEPENDENT_AMBULATORY_CARE_PROVIDER_SITE_OTHER): Payer: Medicare HMO | Admitting: Surgery

## 2019-01-22 ENCOUNTER — Encounter: Payer: Medicare HMO | Admitting: Surgery

## 2019-01-22 ENCOUNTER — Other Ambulatory Visit: Payer: Self-pay

## 2019-01-22 DIAGNOSIS — K810 Acute cholecystitis: Secondary | ICD-10-CM

## 2019-01-22 DIAGNOSIS — Z4889 Encounter for other specified surgical aftercare: Secondary | ICD-10-CM

## 2019-01-22 NOTE — Progress Notes (Signed)
Referring provider:  Mar Daring, PA-C [place] Barbara Anderson,  [place] [address]  Virtual Visit via Telemedicine Note  I connected with Barbara Anderson by telephone at her home on 01/22/19 at  1:30 PM EDT and verified that I was speaking with the correct person using their name and date of birth.   I discussed the limitations, risks, security and privacy concerns of performing an evaluation and management service by telephonic telemedicine and the availability of in person appointments. I also discussed with the patient that there may be a patient responsible charge related to this service. The patient expressed understanding and agreed to proceed.  History of Present Illness: 48 y.o. Female is being evaluated for post-op follow-up 16 Days s/p laparoscopic cholecystectomy Barbara Anderson, 01/06/2019) for acute calculous cholecystitis. Patient reports complete resolution of pre-operative pain and has been tolerating regular diet with +flatus and normal BM's (though "a little more frequent" depending on what she eats), denies N/V, fever/chills, CP, or SOB. Patient further describes incision(s) as well-approximated without any surrounding redness or any associated drainage, purulent or otherwise, says "I've healed up great".  Review of Systems:  Constitutional: denies fever/chills  Respiratory: denies shortness of breath, wheezing  Cardiovascular: denies chest pain, palpitations  Gastrointestinal: abdominal pain, N/V, and bowel function as per interval history Skin: Denies any other rashes or skin discolorations except post-surgical wounds as per interval history  Imaging: No new pertinent imaging available for review   Assessment:  48 y.o. yo Female with a problem list including...  Patient Active Problem List   Diagnosis Date Noted  . Cholecystitis, acute 01/06/2019  . Edema of upper extremity 01/04/2019  . Lymphedema 09/03/2018  . Type 2 diabetes mellitus with microalbuminuria,  without long-term current use of insulin ([place]) 07/21/2018  . Suicidal ideation 04/11/2017  . HTN (hypertension) 04/11/2017  . Chronic pain syndrome 04/11/2017  . PTSD (post-traumatic stress disorder) 04/11/2017  . OCD (obsessive compulsive disorder) 04/11/2017  . Panic disorder with agoraphobia 04/11/2017  . TBI (traumatic brain injury) ([place]) 04/11/2017  . Bipolar I disorder, current or most recent episode depressed, with psychotic features ([place]) 04/10/2017  . Difficulty concentrating 11/24/2015  . Abnormal bruising 10/26/2015  . Fibrositis 10/26/2015  . Cervical pain 10/26/2015  . Morbid obesity ([place]) 10/26/2015  . Disorder of labyrinth 10/26/2015  . Oligomenorrhea 08/30/2015  . Weight gain 08/30/2015  . Costochondritis 04/14/2015  . Dyspnea 04/14/2015  . Recurrent vaginitis   . Clinical depression 09/21/2008  . Late effect of certain complications of trauma 86/76/1950  . Anal bleeding 09/10/2008  . Headache, variant migraine, intractable 04/16/2006    doing well at time of current post-surgical encounter/evaluation 16 Days s/p laparoscopic cholecystectomy Barbara Anderson, 01/06/2019) for acute calculous cholecystitis.  Follow-up Instructions / Plan:               - advance diet as tolerated             - okay to submerge incisions under water (baths, swimming) prn             - gradually resume all activities without restrictions over next 2 weeks             - apply sunblock particularly to incisions with sun exposure to reduce pigmentation of scars             - patient advised she may be contacted to schedule a subsequent appointment/exam when such routine follow-up appointments may safely be performed, though patient may certainly  decline a subsequent appointment if wishes to do so             - otherwise, return to clinic as needed, instructed to call office if any questions or concerns  All of the above recommendations were discussed with the patient, and all of patient's questions  were answered to her expressed satisfaction.  The patient was advised to call back or seek an in-person evaluation if the symptoms worsen or if the condition fails to improve as anticipated.  From [facility] outpatient surgery office, I provided 15 minutes of non-face-to-face time during this encounter.  -- Marilynne Drivers Barbara Hoes, MD, [place]: [facility] - Partnering for exceptional care. Office: [phone]

## 2019-01-23 ENCOUNTER — Encounter: Payer: Self-pay | Admitting: Surgery

## 2019-02-11 DIAGNOSIS — I129 Hypertensive chronic kidney disease with stage 1 through stage 4 chronic kidney disease, or unspecified chronic kidney disease: Secondary | ICD-10-CM | POA: Diagnosis not present

## 2019-02-11 DIAGNOSIS — E1122 Type 2 diabetes mellitus with diabetic chronic kidney disease: Secondary | ICD-10-CM | POA: Diagnosis not present

## 2019-02-11 DIAGNOSIS — J42 Unspecified chronic bronchitis: Secondary | ICD-10-CM | POA: Diagnosis not present

## 2019-02-11 DIAGNOSIS — N183 Chronic kidney disease, stage 3 (moderate): Secondary | ICD-10-CM | POA: Diagnosis not present

## 2019-02-11 DIAGNOSIS — R809 Proteinuria, unspecified: Secondary | ICD-10-CM | POA: Diagnosis not present

## 2019-02-19 ENCOUNTER — Other Ambulatory Visit: Payer: Self-pay | Admitting: Nephrology

## 2019-02-19 DIAGNOSIS — N183 Chronic kidney disease, stage 3 unspecified: Secondary | ICD-10-CM

## 2019-02-20 DIAGNOSIS — E1122 Type 2 diabetes mellitus with diabetic chronic kidney disease: Secondary | ICD-10-CM | POA: Diagnosis not present

## 2019-02-20 DIAGNOSIS — N183 Chronic kidney disease, stage 3 (moderate): Secondary | ICD-10-CM | POA: Diagnosis not present

## 2019-02-20 DIAGNOSIS — R809 Proteinuria, unspecified: Secondary | ICD-10-CM | POA: Diagnosis not present

## 2019-02-20 DIAGNOSIS — I129 Hypertensive chronic kidney disease with stage 1 through stage 4 chronic kidney disease, or unspecified chronic kidney disease: Secondary | ICD-10-CM | POA: Diagnosis not present

## 2019-02-20 DIAGNOSIS — N390 Urinary tract infection, site not specified: Secondary | ICD-10-CM | POA: Diagnosis not present

## 2019-02-24 ENCOUNTER — Ambulatory Visit: Payer: Medicare HMO | Attending: Nephrology

## 2019-03-17 DIAGNOSIS — M545 Low back pain: Secondary | ICD-10-CM | POA: Diagnosis not present

## 2019-03-17 DIAGNOSIS — M7918 Myalgia, other site: Secondary | ICD-10-CM | POA: Diagnosis not present

## 2019-03-17 DIAGNOSIS — M5416 Radiculopathy, lumbar region: Secondary | ICD-10-CM | POA: Diagnosis not present

## 2019-03-17 DIAGNOSIS — G894 Chronic pain syndrome: Secondary | ICD-10-CM | POA: Diagnosis not present

## 2019-03-27 ENCOUNTER — Ambulatory Visit: Payer: Medicare HMO | Attending: Nephrology

## 2019-04-01 ENCOUNTER — Other Ambulatory Visit: Payer: Self-pay | Admitting: Physician Assistant

## 2019-04-01 DIAGNOSIS — M5416 Radiculopathy, lumbar region: Secondary | ICD-10-CM | POA: Diagnosis not present

## 2019-04-01 DIAGNOSIS — M4316 Spondylolisthesis, lumbar region: Secondary | ICD-10-CM | POA: Diagnosis not present

## 2019-04-01 DIAGNOSIS — M5117 Intervertebral disc disorders with radiculopathy, lumbosacral region: Secondary | ICD-10-CM | POA: Diagnosis not present

## 2019-04-01 DIAGNOSIS — M48061 Spinal stenosis, lumbar region without neurogenic claudication: Secondary | ICD-10-CM | POA: Diagnosis not present

## 2019-04-01 DIAGNOSIS — M5116 Intervertebral disc disorders with radiculopathy, lumbar region: Secondary | ICD-10-CM | POA: Diagnosis not present

## 2019-04-10 ENCOUNTER — Ambulatory Visit: Payer: Medicare HMO | Attending: Nephrology

## 2019-05-05 ENCOUNTER — Ambulatory Visit
Admission: RE | Admit: 2019-05-05 | Discharge: 2019-05-05 | Disposition: A | Discharge status: Home / Self Care | Payer: Medicare HMO | Source: Ambulatory Visit | Attending: Nephrology | Admitting: Nephrology

## 2019-05-05 ENCOUNTER — Other Ambulatory Visit: Payer: Self-pay

## 2019-05-05 DIAGNOSIS — N3289 Other specified disorders of bladder: Secondary | ICD-10-CM | POA: Diagnosis not present

## 2019-05-05 DIAGNOSIS — N183 Chronic kidney disease, stage 3 unspecified: Secondary | ICD-10-CM | POA: Insufficient documentation

## 2019-05-13 DIAGNOSIS — M47816 Spondylosis without myelopathy or radiculopathy, lumbar region: Secondary | ICD-10-CM | POA: Diagnosis not present

## 2019-05-13 DIAGNOSIS — M47896 Other spondylosis, lumbar region: Secondary | ICD-10-CM | POA: Diagnosis not present

## 2019-05-13 DIAGNOSIS — M47817 Spondylosis without myelopathy or radiculopathy, lumbosacral region: Secondary | ICD-10-CM | POA: Diagnosis not present

## 2019-05-25 ENCOUNTER — Other Ambulatory Visit: Payer: Self-pay | Admitting: Physician Assistant

## 2019-05-25 DIAGNOSIS — J0140 Acute pansinusitis, unspecified: Secondary | ICD-10-CM

## 2019-05-25 DIAGNOSIS — J40 Bronchitis, not specified as acute or chronic: Secondary | ICD-10-CM

## 2019-05-25 DIAGNOSIS — E119 Type 2 diabetes mellitus without complications: Secondary | ICD-10-CM

## 2019-05-25 DIAGNOSIS — E034 Atrophy of thyroid (acquired): Secondary | ICD-10-CM

## 2019-06-05 DIAGNOSIS — M2550 Pain in unspecified joint: Secondary | ICD-10-CM | POA: Diagnosis not present

## 2019-06-05 DIAGNOSIS — M47816 Spondylosis without myelopathy or radiculopathy, lumbar region: Secondary | ICD-10-CM | POA: Diagnosis not present

## 2019-06-05 DIAGNOSIS — M47817 Spondylosis without myelopathy or radiculopathy, lumbosacral region: Secondary | ICD-10-CM | POA: Diagnosis not present

## 2019-06-12 DIAGNOSIS — M47816 Spondylosis without myelopathy or radiculopathy, lumbar region: Secondary | ICD-10-CM | POA: Diagnosis not present

## 2019-06-12 DIAGNOSIS — M47817 Spondylosis without myelopathy or radiculopathy, lumbosacral region: Secondary | ICD-10-CM | POA: Diagnosis not present

## 2019-06-15 DIAGNOSIS — D631 Anemia in chronic kidney disease: Secondary | ICD-10-CM | POA: Insufficient documentation

## 2019-06-15 DIAGNOSIS — N189 Chronic kidney disease, unspecified: Secondary | ICD-10-CM | POA: Insufficient documentation

## 2019-06-15 DIAGNOSIS — N2581 Secondary hyperparathyroidism of renal origin: Secondary | ICD-10-CM | POA: Insufficient documentation

## 2019-06-15 DIAGNOSIS — I129 Hypertensive chronic kidney disease with stage 1 through stage 4 chronic kidney disease, or unspecified chronic kidney disease: Secondary | ICD-10-CM | POA: Diagnosis not present

## 2019-06-15 DIAGNOSIS — Z6841 Body Mass Index (BMI) 40.0 and over, adult: Secondary | ICD-10-CM | POA: Diagnosis not present

## 2019-06-15 DIAGNOSIS — E1122 Type 2 diabetes mellitus with diabetic chronic kidney disease: Secondary | ICD-10-CM | POA: Diagnosis not present

## 2019-06-15 DIAGNOSIS — R809 Proteinuria, unspecified: Secondary | ICD-10-CM | POA: Diagnosis not present

## 2019-06-15 DIAGNOSIS — N1831 Chronic kidney disease, stage 3a: Secondary | ICD-10-CM | POA: Insufficient documentation

## 2019-06-15 DIAGNOSIS — E876 Hypokalemia: Secondary | ICD-10-CM | POA: Insufficient documentation

## 2019-06-16 ENCOUNTER — Other Ambulatory Visit: Payer: Self-pay

## 2019-06-17 ENCOUNTER — Telehealth: Payer: Self-pay

## 2019-06-17 ENCOUNTER — Ambulatory Visit: Payer: Medicare HMO | Admitting: Women's Health

## 2019-06-17 DIAGNOSIS — Z01419 Encounter for gynecological examination (general) (routine) without abnormal findings: Secondary | ICD-10-CM

## 2019-06-17 NOTE — Telephone Encounter (Signed)
Referral placed.

## 2019-06-17 NOTE — Telephone Encounter (Signed)
Source Subject Topic  Barbara Anderson (Patient) Barbara Anderson (Patient) Referral - Request for Referral  Summary: referral request  Has patient seen PCP for this complaint? yes  *If NO, is insurance requiring patient see PCP for this issue before PCP can refer them?  Referral for which specialty: OB/GYN  Preferred provider/office: no preference  Reason for referral: yearly female check up    Pt can be reached at [phone]

## 2019-06-17 NOTE — Telephone Encounter (Signed)
She has an appt already with Dr. Phineas Real, GYN, on 08/28/19.

## 2019-06-17 NOTE — Telephone Encounter (Signed)
I called and spoke with patient. She was seeing a gynoclolgist in [place], but she would rather see someone in [place] for her yearly checkups. Patient states her insurance requires a referral from her PCP. Please advise if ok to place order for GYN referral.

## 2019-06-22 ENCOUNTER — Other Ambulatory Visit: Payer: Self-pay | Admitting: Physician Assistant

## 2019-06-22 DIAGNOSIS — M4726 Other spondylosis with radiculopathy, lumbar region: Secondary | ICD-10-CM | POA: Diagnosis not present

## 2019-06-22 DIAGNOSIS — E559 Vitamin D deficiency, unspecified: Secondary | ICD-10-CM

## 2019-06-22 DIAGNOSIS — M47816 Spondylosis without myelopathy or radiculopathy, lumbar region: Secondary | ICD-10-CM | POA: Diagnosis not present

## 2019-06-22 NOTE — Telephone Encounter (Signed)
Requested medication (s) are due for refill today: yes  Requested medication (s) are on the active medication list: yes  Last refill:  01/19/2019  Future visit scheduled: no  Notes to clinic:  Refill cannot be delegated  Review for refill   Requested Prescriptions  Pending Prescriptions Disp Refills   Vitamin D, Ergocalciferol, (DRISDOL) 1.25 MG (50000 UT) CAPS capsule [Pharmacy Med Name: VITAMIN D 1.25 MG (50000 UT) Capsule] 12 capsule 1    Sig: TAKE 1 CAPSULE EVERY 7 DAYS     Endocrinology:  Vitamins - Vitamin D Supplementation Failed - 06/22/2019  3:15 AM      Failed - 50,000 IU strengths are not delegated      Failed - Ca in normal range and within 360 days    Calcium  Date Value Ref Range Status  01/07/2019 7.7 (L) 8.9 - 10.3 mg/dL Final   Calcium, Total  Date Value Ref Range Status  05/12/2014 8.1 (L) 8.5 - 10.1 mg/dL Final         Failed - Phosphate in normal range and within 360 days    No results found for: PHOS       Passed - Vitamin D in normal range and within 360 days    Vit D, 25-Hydroxy  Date Value Ref Range Status  01/01/2019 40.1 30.0 - 100.0 ng/mL Final    Comment:    Vitamin D deficiency has been defined by the Institute of Medicine and an Endocrine Society practice guideline as a level of serum 25-OH vitamin D less than 20 ng/mL (1,2). The Endocrine Society went on to further define vitamin D insufficiency as a level between 21 and 29 ng/mL (2). 1. IOM (Institute of Medicine). 2010. Dietary reference    intakes for calcium and D. [place]: The    [employer]. 2. Holick MF, Binkley [place], Bischoff-Ferrari HA, et al.    Evaluation, treatment, and prevention of vitamin D    deficiency: an Endocrine Society clinical practice    guideline. JCEM. 2011 Jul; 96(7):1911-30.          Passed - Valid encounter within last 12 months    Recent Outpatient Visits          5 months ago Type 2 diabetes mellitus with microalbuminuria, without  long-term current use of insulin [facility])   [place], [place], [place]   8 months ago Type 2 diabetes mellitus with microalbuminuria, without long-term current use of insulin [facility])   [place], [place], [place]   8 months ago Hypothyroidism due to acquired atrophy of thyroid   [place], [place], [place]   10 months ago Acute non-recurrent pansinusitis   [facility] [place] Malling M, [place]   10 months ago Weight gain   [facility], [place], [place]

## 2019-06-30 ENCOUNTER — Other Ambulatory Visit: Payer: Self-pay | Admitting: Physician Assistant

## 2019-06-30 DIAGNOSIS — G894 Chronic pain syndrome: Secondary | ICD-10-CM

## 2019-06-30 MED ORDER — PREGABALIN 200 MG PO CAPS: 200.0000 mg | ORAL_CAPSULE | Freq: Two times a day (BID) | ORAL | 1 refills | Status: DC

## 2019-06-30 NOTE — Telephone Encounter (Signed)
[facility] faxed refill request for the following medications:  pregabalin (LYRICA) 200 MG capsule  90 day supply Last Rx: 11/11/2018 90 day supply with 1 refill LOV: 01/01/2019 Please advise. Thanks TNP

## 2019-07-02 ENCOUNTER — Other Ambulatory Visit: Payer: Self-pay | Admitting: Physician Assistant

## 2019-07-02 DIAGNOSIS — G894 Chronic pain syndrome: Secondary | ICD-10-CM

## 2019-07-02 NOTE — Telephone Encounter (Signed)
Medication Refill - Medication: pregabalin (LYRICA) 200 MG capsule   Has the patient contacted their pharmacy? Yes.   (Agent: If no, request that the patient contact the pharmacy for the refill.) (Agent: If yes, when and what did the pharmacy advise?)  Preferred Pharmacy (with phone number or street name):  [place], [address]  [place] [address]  Phone: [phone] Fax: [phone]     Agent: Please be advised that RX refills may take up to 3 business days. We ask that you follow-up with your pharmacy.

## 2019-07-08 ENCOUNTER — Encounter: Payer: Self-pay | Admitting: Obstetrics and Gynecology

## 2019-07-08 ENCOUNTER — Other Ambulatory Visit: Payer: Self-pay

## 2019-07-08 ENCOUNTER — Other Ambulatory Visit (HOSPITAL_COMMUNITY)
Admission: RE | Admit: 2019-07-08 | Discharge: 2019-07-08 | Disposition: A | Discharge status: Home / Self Care | Payer: Medicare HMO | Source: Ambulatory Visit | Attending: Obstetrics and Gynecology | Admitting: Obstetrics and Gynecology

## 2019-07-08 ENCOUNTER — Ambulatory Visit (INDEPENDENT_AMBULATORY_CARE_PROVIDER_SITE_OTHER): Payer: Medicare HMO | Admitting: Obstetrics and Gynecology

## 2019-07-08 VITALS — BP 124/80 | Temp 97.1°F | Ht 67.0 in | Wt 268.0 lb

## 2019-07-08 DIAGNOSIS — Z01419 Encounter for gynecological examination (general) (routine) without abnormal findings: Secondary | ICD-10-CM

## 2019-07-08 DIAGNOSIS — Z113 Encounter for screening for infections with a predominantly sexual mode of transmission: Secondary | ICD-10-CM | POA: Insufficient documentation

## 2019-07-08 DIAGNOSIS — Z1211 Encounter for screening for malignant neoplasm of colon: Secondary | ICD-10-CM

## 2019-07-08 DIAGNOSIS — Z1231 Encounter for screening mammogram for malignant neoplasm of breast: Secondary | ICD-10-CM

## 2019-07-08 DIAGNOSIS — N841 Polyp of cervix uteri: Secondary | ICD-10-CM

## 2019-07-08 DIAGNOSIS — Z0000 Encounter for general adult medical examination without abnormal findings: Secondary | ICD-10-CM

## 2019-07-08 DIAGNOSIS — N870 Mild cervical dysplasia: Secondary | ICD-10-CM | POA: Diagnosis not present

## 2019-07-08 DIAGNOSIS — Z124 Encounter for screening for malignant neoplasm of cervix: Secondary | ICD-10-CM

## 2019-07-08 NOTE — Progress Notes (Signed)
Gynecology Annual Exam  PCP: Mar Daring, PA-C  Chief Complaint:  Chief Complaint  Patient presents with  . Referral    Annual, talk about HPV testing     History of Present Illness: Patient is a 48 y.o. G3P0030 presents for annual exam. The patient has no complaints today.   LMP: Patient's last menstrual period was 07/05/2019 (exact date). Average Interval: irregular,about 9 periods in 1 year Duration of flow: 2 days Heavy Menses: no Clots: no Intermenstrual Bleeding: no Postcoital Bleeding: no Dysmenorrhea: no   The patient is sexually active. She currently uses none for contraception. She denies dyspareunia.  The patient does perform self breast exams.  There is no notable family history of breast or ovarian cancer in her family.  The patient wears seatbelts: yes.   The patient has regular exercise: not asked.    The patient reports current symptoms of depression.    Review of Systems: Review of Systems  Constitutional: Negative for chills, fever, malaise/fatigue and weight loss.  HENT: Negative for congestion, hearing loss and sinus pain.   Eyes: Negative for blurred vision and double vision.  Respiratory: Negative for cough, sputum production, shortness of breath and wheezing.   Cardiovascular: Negative for chest pain, palpitations, orthopnea and leg swelling.  Gastrointestinal: Negative for abdominal pain, constipation, diarrhea, nausea and vomiting.  Genitourinary: Negative for dysuria, flank pain, frequency, hematuria and urgency.  Musculoskeletal: Negative for back pain, falls and joint pain.  Skin: Negative for itching and rash.  Neurological: Negative for dizziness and headaches.  Psychiatric/Behavioral: Negative for depression, substance abuse and suicidal ideas. The patient is not nervous/anxious.     Past Medical History:  Past Medical History:  Diagnosis Date  . ADD (attention deficit disorder)   . Anxiety   . Back pain   . Cholecystitis,  acute 01/06/2019  . Concussion 2001   head trauma-work related. Caused headaches and short term memory loss.  . Depression   . Diabetes mellitus without complication ([place])   . Overactive bladder   . Recurrent vaginitis     Past Surgical History:  Past Surgical History:  Procedure Laterality Date  . ankle pin and plates placement     right  . CHOLECYSTECTOMY N/A 01/06/2019   Procedure: LAPAROSCOPIC CHOLECYSTECTOMY;  Surgeon: Vickie Epley, MD;  Location: [facility] ORS;  Service: General;  Laterality: N/A;  . COSMETIC SURGERY  2005   BREASTS, ARM, ABDOMEN AFTER WEIGHT LOSS    Gynecologic History:  Patient's last menstrual period was 07/05/2019 (exact date). Contraception: none Last Pap: Results were: 2015   Last mammogram: remote history in 30s  Obstetric History: G3P0030  Family History:  Family History  Problem Relation Age of Onset  . Diabetes Mother   . Hypertension Mother   . Depression Mother   . Diabetes Father   . Hypertension Father   . Anxiety disorder Father   . Depression Father   . ADD / ADHD Father   . Depression Sister   . Bipolar disorder Sister   . ADD / ADHD Brother     Social History:  Social History   Socioeconomic History  . Marital status: Single    Spouse name: Not on file  . Number of children: Not on file  . Years of education: Not on file  . Highest education level: Not on file  Occupational History  . Not on file  Social Needs  . Financial resource strain: Not on file  . Food insecurity  Worry: Not on file    Inability: Not on file  . Transportation needs    Medical: Not on file    Non-medical: Not on file  Tobacco Use  . Smoking status: Never Smoker  . Smokeless tobacco: Never Used  Substance and Sexual Activity  . Alcohol use: No    Alcohol/week: 0.0 standard drinks  . Drug use: No  . Sexual activity: Yes    Birth control/protection: None  Lifestyle  . Physical activity    Days per week: Not on file    Minutes per  session: Not on file  . Stress: Not on file  Relationships  . Social Herbalist on phone: Not on file    Gets together: Not on file    Attends religious service: Not on file    Active member of club or organization: Not on file    Attends meetings of clubs or organizations: Not on file    Relationship status: Not on file  . Intimate partner violence    Fear of current or ex partner: Not on file    Emotionally abused: Not on file    Physically abused: Not on file    Forced sexual activity: Not on file  Other Topics Concern  . Not on file  Social History Narrative  . Not on file    Allergies:  No Known Allergies  Medications: Prior to Admission medications   Medication Sig Start Date End Date Taking? Authorizing Provider  Accu-Chek Softclix Lancets lancets Use as instructed 11/03/18  Yes Mar Daring, PA-C  albuterol (VENTOLIN HFA) 108 (90 Base) MCG/ACT inhaler INHALE 2 PUFFS INTO THE LUNGS EVERY 4 HOURS AS NEEDED FOR WHEEZING OR SHORTNESS OF BREATH. 05/25/19  Yes Mar Daring, PA-C  Alcohol Swabs (B-D SINGLE USE SWABS REGULAR) PADS USE TO CLEANSE SKIN DAILY BEFORE CHECKING BLOOD SUGAR 04/01/19  Yes Fenton Malling M, PA-C  ALPRAZolam Duanne Moron) 1 MG tablet  06/24/19  Yes [provider]  atorvastatin (LIPITOR) 10 MG tablet Take 1 tablet (10 mg total) by mouth daily. 11/03/18  Yes Mar Daring, PA-C  Blood Glucose Monitoring Suppl (ACCU-CHEK AVIVA PLUS) w/Device KIT To check blood sugar once daily 11/03/18  Yes Burnette, Jennifer M, PA-C  clotrimazole (LOTRIMIN) 1 % cream Apply 1 application topically 2 (two) times daily. 05/21/18  Yes Bacigalupo, Dionne Bucy, MD  D-5000 125 MCG (5000 UT) TABS Take 1 tablet by mouth once a week. 02/11/19  Yes [provider]  DULoxetine (CYMBALTA) 60 MG capsule Take 2 capsules (120 mg total) by mouth daily. 04/15/17  Yes Pucilowska, Jolanta B, MD  ferrous sulfate 325 (65 FE) MG tablet Take 325 mg by mouth daily  with breakfast.   Yes [provider]  fluticasone (FLONASE) 50 MCG/ACT nasal spray USE 2 SPRAYS INTO BOTH NOSTRILS DAILY. 05/25/19  Yes Fenton Malling M, PA-C  folic acid (FOLVITE) 1 MG tablet Take 1 tablet (1 mg total) daily by mouth. 06/13/17  Yes Burnette, Clearnce Sorrel, PA-C  furosemide (LASIX) 40 MG tablet Take 1 tablet (40 mg total) by mouth daily. 11/03/18  Yes Fenton Malling M, PA-C  glucose blood (ACCU-CHEK AVIVA PLUS) test strip To check blood sugar once daily 11/03/18  Yes Burnette, Clearnce Sorrel, PA-C  hydrOXYzine (ATARAX/VISTARIL) 50 MG tablet Take 1 tablet (50 mg total) by mouth 3 (three) times daily as needed for anxiety. 04/15/17  Yes Pucilowska, Jolanta B, MD  Lancets Misc. (ACCU-CHEK FASTCLIX LANCET) KIT To check  blood sugar once daily 07/21/18  Yes Burnette, Clearnce Sorrel, PA-C  levothyroxine (SYNTHROID) 25 MCG tablet TAKE 1 TABLET (25 MCG TOTAL) BY MOUTH DAILY BEFORE BREAKFAST. 05/25/19  Yes Mar Daring, PA-C  lisinopril (PRINIVIL,ZESTRIL) 20 MG tablet Take 1 tablet (20 mg total) by mouth daily. 11/03/18  Yes Fenton Malling M, PA-C  metFORMIN (GLUCOPHAGE) 500 MG tablet TAKE 1 TABLET (500 MG TOTAL) BY MOUTH 2 (TWO) TIMES DAILY WITH A MEAL. 05/25/19  Yes Mar Daring, PA-C  oxyCODONE (OXY IR/ROXICODONE) 5 MG immediate release tablet Take 1 tablet (5 mg total) by mouth every 6 (six) hours as needed for severe pain or breakthrough pain. 01/07/19  Yes Edison Simon R, PA-C  pregabalin (LYRICA) 200 MG capsule Take 1 capsule (200 mg total) by mouth 2 (two) times daily. 06/30/19  Yes Fenton Malling M, PA-C  QUEtiapine Fumarate (SEROQUEL XR) 150 MG 24 hr tablet  06/21/19  Yes [provider]  risperiDONE (RISPERDAL) 1 MG tablet Take 1 tablet (1 mg total) by mouth at bedtime. 04/15/17  Yes Pucilowska, Jolanta B, MD  topiramate (TOPAMAX) 200 MG tablet Take 1 tablet (200 mg total) by mouth at bedtime. 04/15/17  Yes Pucilowska, Jolanta B, MD  traZODone  (DESYREL) 150 MG tablet Take 1 tablet (150 mg total) by mouth at bedtime. 04/15/17  Yes Pucilowska, Jolanta B, MD  Vitamin D, Ergocalciferol, (DRISDOL) 1.25 MG (50000 UT) CAPS capsule TAKE 1 CAPSULE EVERY 7 DAYS 06/22/19  Yes Mar Daring, PA-C    Physical Exam Vitals: Blood pressure 124/80, temperature (!) 97.1 F (36.2 C), height _0  (1.702 m), weight 268 lb (121.6 kg), last menstrual period 07/05/2019.  General: NAD HEENT: normocephalic, anicteric Thyroid: no enlargement, no palpable nodules Pulmonary: No increased work of breathing, CTAB Cardiovascular: RRR, distal pulses 2+ Breast: Breast symmetrical, no tenderness, no palpable nodules or masses, no skin or nipple retraction present, no nipple discharge.  No axillary or supraclavicular lymphadenopathy. Abdomen: NABS, soft, non-tender, non-distended.  Umbilicus without lesions.  No hepatomegaly, splenomegaly or masses palpable. No evidence of hernia  Genitourinary:  External: Normal external female genitalia.  Normal urethral meatus, normal Bartholin's and Skene's glands.    Vagina: Normal vaginal mucosa, no evidence of prolapse.    Cervix: Grossly normal in appearance, no bleeding  Uterus: Non-enlarged, mobile, normal contour.  No CMT  Adnexa: ovaries non-enlarged, no adnexal masses  Rectal: deferred  Lymphatic: no evidence of inguinal lymphadenopathy Extremities: no edema, erythema, or tenderness Neurologic: Grossly intact Psychiatric: mood appropriate, affect full  Female chaperone present for pelvic and breast  portions of the physical exam   CERVICAL POLYPECTOMY PROCEDURE NOTE Cervical polyp grasped with Allis clamp, twisted and removed intact. Sent to pathology, Minimal bleeding after procedure.   Assessment: 48 y.o. G3P0030 routine annual exam  Plan: Problem List Items Addressed This Visit    None    Visit Diagnoses    Healthcare maintenance    -  Primary   Screen for STD (sexually transmitted disease)        Relevant Orders   HIV antibody (with reflex)   RPR   Hepatitis panel   HSV1+2 (IgG+IgM)   Cytology - PAP   Cervical cancer screening       Relevant Orders   Cytology - PAP   Breast cancer screening by mammogram       Relevant Orders   Mammogram Screening Routine   Encounter for cervical Pap smear with pelvic exam  Cervical polyp       Relevant Orders   Surgical pathology      1) Mammogram - recommend yearly screening mammogram.  Mammogram Was ordered today  2) STI screening  was offered and accepted. Desired cervical swabs and blood tests.  3) ASCCP guidelines and rational discussed.  Patient opts for every 5 years screening interval  4) Contraception - the patient is currently using  none.  She is happy with her current form of contraception and plans to continue  5) Colonoscopy -- Screening recommended starting at age 87 for average risk individuals, age 71 for individuals deemed at increased risk (including African Americans) and recommended to continue until age 33.  For patient age 64-85 individualized approach is recommended.  Gold standard screening is via colonoscopy, Cologuard screening is an acceptable alternative for patient unwilling or unable to undergo colonoscopy.  "Colorectal cancer screening for average?risk adults: 2018 guideline update from the American Cancer Society"CA: A Cancer Journal for Clinicians: Dec 26, 2016   6) Routine healthcare maintenance including cholesterol, diabetes screening discussed managed by PCP  7) Cervical polyp removed in office without problem  8)No follow-ups on file.  Adrian Prows MD [facility], [facility] 07/08/2019 2:30 PM

## 2019-07-09 ENCOUNTER — Telehealth: Payer: Self-pay

## 2019-07-09 ENCOUNTER — Other Ambulatory Visit: Payer: Self-pay

## 2019-07-09 DIAGNOSIS — Z1211 Encounter for screening for malignant neoplasm of colon: Secondary | ICD-10-CM

## 2019-07-09 NOTE — Telephone Encounter (Signed)
Gastroenterology Pre-Procedure Review  Request Date: Monday January 4th, 2021 Requesting Physician: Dr. Bonna Gains  PATIENT REVIEW QUESTIONS: The patient responded to the following health history questions as indicated:    1. Are you having any GI issues? no 2. Do you have a personal history of Polyps? unsure maybe 10 years ago 3. Do you have a family history of Colon Cancer or Polyps? no 4. Diabetes Mellitus? yes (Oral meds) 5. Joint replacements in the past 12 months?Gallbladder surgery 5 months ago 6. Major health problems in the past 3 months?Gallbladder surgery 5 months ago 7. Any artificial heart valves, MVP, or defibrillator?no    MEDICATIONS & ALLERGIES:    Patient reports the following regarding taking any anticoagulation/antiplatelet therapy:   Plavix, Coumadin, Eliquis, Xarelto, Lovenox, Pradaxa, Brilinta, or Effient? no Aspirin? no  Patient confirms/reports the following medications:  Current Outpatient Medications  Medication Sig Dispense Refill  . Accu-Chek Softclix Lancets lancets Use as instructed 100 each 12  . albuterol (VENTOLIN HFA) 108 (90 Base) MCG/ACT inhaler INHALE 2 PUFFS INTO THE LUNGS EVERY 4 HOURS AS NEEDED FOR WHEEZING OR SHORTNESS OF BREATH. 18 g 3  . Alcohol Swabs (B-D SINGLE USE SWABS REGULAR) PADS USE TO CLEANSE SKIN DAILY BEFORE CHECKING BLOOD SUGAR 100 each 1  . ALPRAZolam (XANAX) 1 MG tablet     . atorvastatin (LIPITOR) 10 MG tablet Take 1 tablet (10 mg total) by mouth daily. 90 tablet 3  . Blood Glucose Monitoring Suppl (ACCU-CHEK AVIVA PLUS) w/Device KIT To check blood sugar once daily 1 kit 0  . clotrimazole (LOTRIMIN) 1 % cream Apply 1 application topically 2 (two) times daily. 30 g 0  . D-5000 125 MCG (5000 UT) TABS Take 1 tablet by mouth once a week.    . DULoxetine (CYMBALTA) 60 MG capsule Take 2 capsules (120 mg total) by mouth daily. 60 capsule 0  . ferrous sulfate 325 (65 FE) MG tablet Take 325 mg by mouth daily with breakfast.    .  fluticasone (FLONASE) 50 MCG/ACT nasal spray USE 2 SPRAYS INTO BOTH NOSTRILS DAILY. 48 g 1  . folic acid (FOLVITE) 1 MG tablet Take 1 tablet (1 mg total) daily by mouth. 90 tablet 1  . furosemide (LASIX) 40 MG tablet Take 1 tablet (40 mg total) by mouth daily. 90 tablet 3  . glucose blood (ACCU-CHEK AVIVA PLUS) test strip To check blood sugar once daily 100 each 12  . hydrOXYzine (ATARAX/VISTARIL) 50 MG tablet Take 1 tablet (50 mg total) by mouth 3 (three) times daily as needed for anxiety. 90 tablet 1  . Lancets Misc. (ACCU-CHEK FASTCLIX LANCET) KIT To check blood sugar once daily 1 kit 0  . levothyroxine (SYNTHROID) 25 MCG tablet TAKE 1 TABLET (25 MCG TOTAL) BY MOUTH DAILY BEFORE BREAKFAST. 90 tablet 1  . lisinopril (PRINIVIL,ZESTRIL) 20 MG tablet Take 1 tablet (20 mg total) by mouth daily. 90 tablet 3  . metFORMIN (GLUCOPHAGE) 500 MG tablet TAKE 1 TABLET (500 MG TOTAL) BY MOUTH 2 (TWO) TIMES DAILY WITH A MEAL. 180 tablet 1  . oxyCODONE (OXY IR/ROXICODONE) 5 MG immediate release tablet Take 1 tablet (5 mg total) by mouth every 6 (six) hours as needed for severe pain or breakthrough pain. 15 tablet 0  . pregabalin (LYRICA) 200 MG capsule Take 1 capsule (200 mg total) by mouth 2 (two) times daily. 180 capsule 1  . QUEtiapine Fumarate (SEROQUEL XR) 150 MG 24 hr tablet     . risperiDONE (RISPERDAL) 1 MG tablet  Take 1 tablet (1 mg total) by mouth at bedtime. 30 tablet 1  . topiramate (TOPAMAX) 200 MG tablet Take 1 tablet (200 mg total) by mouth at bedtime. 30 tablet 1  . traZODone (DESYREL) 150 MG tablet Take 1 tablet (150 mg total) by mouth at bedtime. 30 tablet 1  . Vitamin D, Ergocalciferol, (DRISDOL) 1.25 MG (50000 UT) CAPS capsule TAKE 1 CAPSULE EVERY 7 DAYS 12 capsule 1   No current facility-administered medications for this visit.    Patient confirms/reports the following allergies:  No Known Allergies  No orders of the defined types were placed in this encounter.   AUTHORIZATION  INFORMATION Primary Insurance: 1D#: Group #:  Secondary Insurance: 1D#: Group #:  SCHEDULE INFORMATION: Date:  Time: Location:

## 2019-07-10 LAB — HEPATITIS PANEL, ACUTE
Hep A IgM: NEGATIVE
Hep B C IgM: NEGATIVE
Hep C Virus Ab: 0.8 s/co ratio (ref 0.0–0.9)
Hepatitis B Surface Ag: NEGATIVE

## 2019-07-10 LAB — HSV(HERPES SMPLX)ABS-I+II(IGG+IGM)-BLD
HSV 1 Glycoprotein G Ab, IgG: 35.1 index — ABNORMAL HIGH (ref 0.00–0.90)
HSV 2 IgG, Type Spec: 8.94 index — ABNORMAL HIGH (ref 0.00–0.90)
HSVI/II Comb IgM: <0.91 Ratio (ref 0.00–0.90)

## 2019-07-10 LAB — HIV ANTIBODY (ROUTINE TESTING W REFLEX): HIV Screen 4th Generation wRfx: NONREACTIVE

## 2019-07-10 LAB — RPR: RPR Ser Ql: NONREACTIVE

## 2019-07-13 LAB — CYTOLOGY - PAP
Chlamydia: NEGATIVE
Comment: NEGATIVE
Comment: NEGATIVE
Comment: NEGATIVE
Comment: NORMAL
Diagnosis: NEGATIVE
High risk HPV: NEGATIVE
Neisseria Gonorrhea: NEGATIVE
Trichomonas: NEGATIVE

## 2019-07-13 LAB — SURGICAL PATHOLOGY

## 2019-07-15 NOTE — Progress Notes (Signed)
Called and reviewed results with patient. 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 Normal cytology and HPV negative pap this year.

## 2019-07-30 ENCOUNTER — Other Ambulatory Visit: Payer: Medicare HMO | Attending: Gastroenterology

## 2019-08-03 ENCOUNTER — Ambulatory Visit: Admission: RE | Admit: 2019-08-03 | Payer: Medicare HMO | Source: Home / Self Care | Admitting: Gastroenterology

## 2019-08-03 ENCOUNTER — Encounter: Admission: RE | Payer: Self-pay | Source: Home / Self Care

## 2019-08-03 SURGERY — COLONOSCOPY WITH PROPOFOL
Anesthesia: General

## 2019-08-13 DIAGNOSIS — R7303 Prediabetes: Secondary | ICD-10-CM | POA: Insufficient documentation

## 2019-08-27 ENCOUNTER — Other Ambulatory Visit: Payer: Self-pay | Admitting: Physician Assistant

## 2019-08-28 ENCOUNTER — Ambulatory Visit: Payer: Medicare HMO | Admitting: Obstetrics and Gynecology

## 2019-09-22 ENCOUNTER — Ambulatory Visit: Payer: Self-pay

## 2019-09-22 NOTE — Telephone Encounter (Signed)
Patient called stating that she has been dizzy weak for one month. She states that she is nauseated all the time. She states she has lost weight but is unable to weigh.  She has gone down several sizes in clothing. She is diabetic and takes metformin.  Her blood sugar this am was 92. Now it is 110. She denies other symptoms. No cold symptoms. No fever.  She denies any contact with COVID-19.  She states she has anxiety disorder and stays home. She states her anxiety is bad. Per protocol patient was asked to go to ER for evaluation. She requested that office be asked first. Call placed to office and was advised that she should go to ER for evaluation. Patient was informed and will go to ER. She has someone to drive her. Care advice read to patient.  She verbalized understanding.  Reason for Disposition . Patient sounds very sick or weak to the triager  Answer Assessment - Initial Assessment Questions 1. DESCRIPTION: "Describe your dizziness."    lightheaded 2. LIGHTHEADED: "Do you feel lightheaded?" (e.g., somewhat faint, woozy, weak upon standing)    Woozy feel that she might faint 3. VERTIGO: "Do you feel like either you or the room is spinning or tilting?" (i.e. vertigo)    When standing 4. SEVERITY: "How bad is it?"  "Do you feel like you are going to faint?" "Can you stand and walk?"   - MILD - walking normally   - MODERATE - interferes with normal activities (e.g., work, school)    - SEVERE - unable to stand, requires support to walk, feels like passing out now.     Holds on to things 5. ONSET:  "When did the dizziness begin?"     1 month 6. AGGRAVATING FACTORS: "Does anything make it worse?" (e.g., standing, change in head position)    standing 7. HEART RATE: "Can you tell me your heart rate?" "How many beats in 15 seconds?"  (Note: not all patients can do this)      Unsure feels weak when standing 8. CAUSE: "What do you think is causing the dizziness?"     no 9. RECURRENT SYMPTOM:  "Have you had dizziness before?" If so, ask: "When was the last time?" "What happened that time?"    no 10. OTHER SYMPTOMS: "Do you have any other symptoms?" (e.g., fever, chest pain, vomiting, diarrhea, bleeding)       Nauseated, diarrhea 11. PREGNANCY: "Is there any chance you are pregnant?" "When was your last menstrual period?"     No period 1.5 weeks ago  Protocols used: [place]

## 2019-10-01 ENCOUNTER — Telehealth: Payer: Self-pay

## 2019-10-01 NOTE — Telephone Encounter (Signed)
Copied from [place]. Topic: General - Other >> Oct 01, 2019  1:31 PM Mcneil, Ja-Kwan wrote: Reason for CRM: Pt stated she needs to speak with the nurse and have labs drawn because last week she had to stop taking her bp medication and metformin due to dizziness. Pt requests call back. Cb# [phone]

## 2019-10-01 NOTE — Telephone Encounter (Signed)
Patient scheduled for 10/05/2019.

## 2019-10-01 NOTE — Telephone Encounter (Signed)
She is prob gonna need in office appt to address since she stopped meds.

## 2019-10-05 ENCOUNTER — Other Ambulatory Visit: Payer: Self-pay

## 2019-10-05 ENCOUNTER — Ambulatory Visit (INDEPENDENT_AMBULATORY_CARE_PROVIDER_SITE_OTHER): Payer: Medicare HMO | Admitting: Physician Assistant

## 2019-10-05 ENCOUNTER — Encounter: Payer: Self-pay | Admitting: Physician Assistant

## 2019-10-05 VITALS — BP 122/86 | HR 78 | Temp 97.0°F | Resp 16 | Wt 240.4 lb

## 2019-10-05 DIAGNOSIS — R42 Dizziness and giddiness: Secondary | ICD-10-CM | POA: Diagnosis not present

## 2019-10-05 DIAGNOSIS — Z6837 Body mass index (BMI) 37.0-37.9, adult: Secondary | ICD-10-CM

## 2019-10-05 DIAGNOSIS — S069X1S Unspecified intracranial injury with loss of consciousness of 30 minutes or less, sequela: Secondary | ICD-10-CM

## 2019-10-05 DIAGNOSIS — E1122 Type 2 diabetes mellitus with diabetic chronic kidney disease: Secondary | ICD-10-CM

## 2019-10-05 DIAGNOSIS — F315 Bipolar disorder, current episode depressed, severe, with psychotic features: Secondary | ICD-10-CM

## 2019-10-05 DIAGNOSIS — N183 Chronic kidney disease, stage 3 unspecified: Secondary | ICD-10-CM | POA: Diagnosis not present

## 2019-10-05 DIAGNOSIS — K591 Functional diarrhea: Secondary | ICD-10-CM | POA: Diagnosis not present

## 2019-10-05 NOTE — Patient Instructions (Signed)
Dizziness Dizziness is a common problem. It is a feeling of unsteadiness or light-headedness. You may feel like you are about to faint. Dizziness can lead to injury if you stumble or fall. Anyone can become dizzy, but dizziness is more common in older adults. This condition can be caused by a number of things, including medicines, dehydration, or illness. Follow these instructions at home: Eating and drinking  Drink enough fluid to keep your urine clear or pale yellow. This helps to keep you from becoming dehydrated. Try to drink more clear fluids, such as water.  Do not drink alcohol.  Limit your caffeine intake if told to do so by your health care provider. Check ingredients and nutrition facts to see if a food or beverage contains caffeine.  Limit your salt (sodium) intake if told to do so by your health care provider. Check ingredients and nutrition facts to see if a food or beverage contains sodium. Activity  Avoid making quick movements. ? Rise slowly from chairs and steady yourself until you feel okay. ? In the morning, first sit up on the side of the bed. When you feel okay, stand slowly while you hold onto something until you know that your balance is fine.  If you need to stand in one place for a long time, move your legs often. Tighten and relax the muscles in your legs while you are standing.  Do not drive or use heavy machinery if you feel dizzy.  Avoid bending down if you feel dizzy. Place items in your home so that they are easy for you to reach without leaning over. Lifestyle  Do not use any products that contain nicotine or tobacco, such as cigarettes and e-cigarettes. If you need help quitting, ask your health care provider.  Try to reduce your stress level by using methods such as yoga or meditation. Talk with your health care provider if you need help to manage your stress. General instructions  Watch your dizziness for any changes.  Take over-the-counter and  prescription medicines only as told by your health care provider. Talk with your health care provider if you think that your dizziness is caused by a medicine that you are taking.  Tell a friend or a family member that you are feeling dizzy. If he or she notices any changes in your behavior, have this person call your health care provider.  Keep all follow-up visits as told by your health care provider. This is important. Contact a health care provider if:  Your dizziness does not go away.  Your dizziness or light-headedness gets worse.  You feel nauseous.  You have reduced hearing.  You have new symptoms.  You are unsteady on your feet or you feel like the room is spinning. Get help right away if:  You vomit or have diarrhea and are unable to eat or drink anything.  You have problems talking, walking, swallowing, or using your arms, hands, or legs.  You feel generally weak.  You are not thinking clearly or you have trouble forming sentences. It may take a friend or family member to notice this.  You have chest pain, abdominal pain, shortness of breath, or sweating.  Your vision changes.  You have any bleeding.  You have a severe headache.  You have neck pain or a stiff neck.  You have a fever. These symptoms may represent a serious problem that is an emergency. Do not wait to see if the symptoms will go away. Get medical help   right away. Call your local emergency services (911 in the [place] Do not drive yourself to the hospital. Summary  Dizziness is a feeling of unsteadiness or light-headedness. This condition can be caused by a number of things, including medicines, dehydration, or illness.  Anyone can become dizzy, but dizziness is more common in older adults.  Drink enough fluid to keep your urine clear or pale yellow. Do not drink alcohol.  Avoid making quick movements if you feel dizzy. Monitor your dizziness for any changes. This information is not intended to  replace advice given to you by your health care provider. Make sure you discuss any questions you have with your health care provider. Document Revised: 07/19/2017 Document Reviewed: 08/18/2016 Elsevier Patient Education  2020 [employer]  

## 2019-10-05 NOTE — Progress Notes (Signed)
Patient: Barbara Anderson Female    DOB: January 25, 1971   49 y.o.   MRN: [id number] Visit Date: 10/05/2019  Today's Provider: Mar Daring, PA-C   Chief Complaint  Patient presents with  . Hypertension  . Diabetes   Subjective:     HPI  Patient here with c/o needing blood work done. She stopped taking her bp medication and metformin due to dizziness about a 2 weeks ago. Reports that the she is better from the dizziness. She is checking her sugar levels at home. Reports that when she gets up in the morning her sugars level are 69-79. BP are normal as well.   No Known Allergies   Current Outpatient Medications:  .  Accu-Chek Softclix Lancets lancets, Use as instructed, Disp: 100 each, Rfl: 12 .  Alcohol Swabs (B-D SINGLE USE SWABS REGULAR) PADS, USE TO CLEANSE SKIN DAILY BEFORE CHECKING BLOOD SUGAR, Disp: 100 each, Rfl: 1 .  atorvastatin (LIPITOR) 10 MG tablet, Take 1 tablet (10 mg total) by mouth daily., Disp: 90 tablet, Rfl: 3 .  Blood Glucose Monitoring Suppl (ACCU-CHEK AVIVA PLUS) w/Device KIT, To check blood sugar once daily, Disp: 1 kit, Rfl: 0 .  D-5000 125 MCG (5000 UT) TABS, Take 1 tablet by mouth once a week., Disp: , Rfl:  .  DULoxetine (CYMBALTA) 60 MG capsule, Take 2 capsules (120 mg total) by mouth daily., Disp: 60 capsule, Rfl: 0 .  ferrous sulfate 325 (65 FE) MG tablet, Take 325 mg by mouth daily with breakfast., Disp: , Rfl:  .  fluticasone (FLONASE) 50 MCG/ACT nasal spray, USE 2 SPRAYS INTO BOTH NOSTRILS DAILY., Disp: 48 g, Rfl: 1 .  folic acid (FOLVITE) 1 MG tablet, Take 1 tablet (1 mg total) daily by mouth., Disp: 90 tablet, Rfl: 1 .  glucose blood (ACCU-CHEK AVIVA PLUS) test strip, To check blood sugar once daily, Disp: 100 each, Rfl: 12 .  hydrOXYzine (ATARAX/VISTARIL) 50 MG tablet, Take 1 tablet (50 mg total) by mouth 3 (three) times daily as needed for anxiety., Disp: 90 tablet, Rfl: 1 .  Lancets Misc. (ACCU-CHEK FASTCLIX LANCET) KIT, To check blood sugar  once daily, Disp: 1 kit, Rfl: 0 .  levothyroxine (SYNTHROID) 25 MCG tablet, TAKE 1 TABLET (25 MCG TOTAL) BY MOUTH DAILY BEFORE BREAKFAST., Disp: 90 tablet, Rfl: 1 .  pregabalin (LYRICA) 200 MG capsule, Take 1 capsule (200 mg total) by mouth 2 (two) times daily., Disp: 180 capsule, Rfl: 1 .  QUEtiapine Fumarate (SEROQUEL XR) 150 MG 24 hr tablet, , Disp: , Rfl:  .  risperiDONE (RISPERDAL) 1 MG tablet, Take 1 tablet (1 mg total) by mouth at bedtime., Disp: 30 tablet, Rfl: 1 .  topiramate (TOPAMAX) 200 MG tablet, Take 1 tablet (200 mg total) by mouth at bedtime., Disp: 30 tablet, Rfl: 1 .  traZODone (DESYREL) 150 MG tablet, Take 1 tablet (150 mg total) by mouth at bedtime., Disp: 30 tablet, Rfl: 1 .  albuterol (VENTOLIN HFA) 108 (90 Base) MCG/ACT inhaler, INHALE 2 PUFFS INTO THE LUNGS EVERY 4 HOURS AS NEEDED FOR WHEEZING OR SHORTNESS OF BREATH. (Patient not taking: Reported on 10/05/2019), Disp: 18 g, Rfl: 3 .  ALPRAZolam (XANAX) 1 MG tablet, , Disp: , Rfl:  .  furosemide (LASIX) 40 MG tablet, Take 1 tablet (40 mg total) by mouth daily. (Patient not taking: Reported on 10/05/2019), Disp: 90 tablet, Rfl: 3 .  lisinopril (PRINIVIL,ZESTRIL) 20 MG tablet, Take 1 tablet (20 mg total) by mouth  daily. (Patient not taking: Reported on 10/05/2019), Disp: 90 tablet, Rfl: 3 .  metFORMIN (GLUCOPHAGE) 500 MG tablet, TAKE 1 TABLET (500 MG TOTAL) BY MOUTH 2 (TWO) TIMES DAILY WITH A MEAL. (Patient not taking: Reported on 10/05/2019), Disp: 180 tablet, Rfl: 1 .  oxyCODONE (OXY IR/ROXICODONE) 5 MG immediate release tablet, Take 1 tablet (5 mg total) by mouth every 6 (six) hours as needed for severe pain or breakthrough pain. (Patient not taking: Reported on 10/05/2019), Disp: 15 tablet, Rfl: 0 .  Vitamin D, Ergocalciferol, (DRISDOL) 1.25 MG (50000 UT) CAPS capsule, TAKE 1 CAPSULE EVERY 7 DAYS, Disp: 12 capsule, Rfl: 1  Review of Systems  Constitutional: Negative.   Respiratory: Negative.   Cardiovascular: Negative.     Gastrointestinal: Negative.   Neurological: Negative.     Social History   Tobacco Use  . Smoking status: Never Smoker  . Smokeless tobacco: Never Used  Substance Use Topics  . Alcohol use: No    Alcohol/week: 0.0 standard drinks      Objective:   BP 122/86 (BP Location: Left Arm, Patient Position: Sitting, Cuff Size: Large)   Pulse 78   Temp (!) 97 F (36.1 C) (Temporal)   Resp 16   Wt 240 lb 6.4 oz (109 kg)   BMI 37.65 kg/m  Vitals:   10/05/19 1446  BP: 122/86  Pulse: 78  Resp: 16  Temp: (!) 97 F (36.1 C)  TempSrc: Temporal  Weight: 240 lb 6.4 oz (109 kg)  Body mass index is 37.65 kg/m.   Physical Exam Vitals reviewed.  Constitutional:      General: She is not in acute distress.    Appearance: Normal appearance. She is well-developed. She is not ill-appearing or diaphoretic.  Cardiovascular:     Rate and Rhythm: Normal rate and regular rhythm.     Pulses: Normal pulses.     Heart sounds: Normal heart sounds. No murmur. No friction rub. No gallop.   Pulmonary:     Effort: Pulmonary effort is normal. No respiratory distress.     Breath sounds: Normal breath sounds. No wheezing or rales.  Musculoskeletal:     Cervical back: Normal range of motion and neck supple.     Right lower leg: No edema.     Left lower leg: No edema.  Neurological:     Mental Status: She is alert.      No results found for any visits on 10/05/19.     Assessment & Plan    1. Dizziness Suspect that metformin caused severe diarrhea and patient became dehydrated and lost weight. Dehydration coupled with the weight loss caused her BP to drop as well, worsening the dizziness. Now that medications have been stopped dizziness and diarrhea have improved. Will check labs as below to make sure stable and to make sure no other diabetic medication required at this time. BP has normalized. Continue to not take BP medications at this time. I will f/u pending results. Return in 4 months for  CPE. - CBC w/Diff/Platelet - Comprehensive Metabolic Panel (CMET) - Fe+TIBC+Fer - HgB A1c  2. 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 See above medical treatment plan. - CBC w/Diff/Platelet - Comprehensive Metabolic Panel (CMET) - Fe+TIBC+Fer - HgB A1c  3. Class 2 severe obesity due to excess calories with serious comorbidity and body mass index (BMI) of 37.0 to 37.9 in adult [facility]) Has recently lost 10% body weight. Counseled patient on healthy lifestyle modifications including dieting and exercise.   4. Bipolar  I disorder, current or most recent episode depressed, with psychotic features ([place]) Stable.  5. CKD stage 3 due to type 2 diabetes mellitus (HCC) Stable. Followed by Dr. Abigail Butts annually. Family h/o ESRD w/ dialysis in her father.   6. Traumatic brain injury, with loss of consciousness of 30 minutes or less, sequela (HCC) Stable.      Mar Daring, PA-C  [facility]

## 2019-10-06 LAB — COMPREHENSIVE METABOLIC PANEL
ALT: 18 IU/L (ref 0–32)
AST: 18 IU/L (ref 0–40)
Albumin/Globulin Ratio: 1.6 (ref 1.2–2.2)
Albumin: 4.2 g/dL (ref 3.8–4.8)
Alkaline Phosphatase: 155 IU/L — ABNORMAL HIGH (ref 39–117)
BUN/Creatinine Ratio: 8 — ABNORMAL LOW (ref 9–23)
BUN: 8 mg/dL (ref 6–24)
Bilirubin Total: 0.3 mg/dL (ref 0.0–1.2)
CO2: 21 mmol/L (ref 20–29)
Calcium: 9.2 mg/dL (ref 8.7–10.2)
Chloride: 104 mmol/L (ref 96–106)
Creatinine, Ser: 1.04 mg/dL — ABNORMAL HIGH (ref 0.57–1.00)
GFR calc Af Amer: 72 mL/min/{1.73_m2} (ref >59)
GFR calc non Af Amer: 63 mL/min/{1.73_m2} (ref >59)
Globulin, Total: 2.7 g/dL (ref 1.5–4.5)
Glucose: 91 mg/dL (ref 65–99)
Potassium: 4 mmol/L (ref 3.5–5.2)
Sodium: 139 mmol/L (ref 134–144)
Total Protein: 6.9 g/dL (ref 6.0–8.5)

## 2019-10-06 LAB — IRON,TIBC AND FERRITIN PANEL
Ferritin: 206 ng/mL — ABNORMAL HIGH (ref 15–150)
Iron Saturation: 30 % (ref 15–55)
Iron: 76 ug/dL (ref 27–159)
Total Iron Binding Capacity: 253 ug/dL (ref 250–450)
UIBC: 177 ug/dL (ref 131–425)

## 2019-10-06 LAB — CBC WITH DIFFERENTIAL/PLATELET
Basophils Absolute: 0 10*3/uL (ref 0.0–0.2)
Basos: 1 %
EOS (ABSOLUTE): 0.2 10*3/uL (ref 0.0–0.4)
Eos: 2 %
Hematocrit: 42 % (ref 34.0–46.6)
Hemoglobin: 13.6 g/dL (ref 11.1–15.9)
Immature Grans (Abs): 0 10*3/uL (ref 0.0–0.1)
Immature Granulocytes: 0 %
Lymphocytes Absolute: 1.6 10*3/uL (ref 0.7–3.1)
Lymphs: 24 %
MCH: 29.2 pg (ref 26.6–33.0)
MCHC: 32.4 g/dL (ref 31.5–35.7)
MCV: 90 fL (ref 79–97)
Monocytes Absolute: 0.4 10*3/uL (ref 0.1–0.9)
Monocytes: 5 %
Neutrophils Absolute: 4.6 10*3/uL (ref 1.4–7.0)
Neutrophils: 68 %
Platelets: 222 10*3/uL (ref 150–450)
RBC: 4.66 x10E6/uL (ref 3.77–5.28)
RDW: 14.4 % (ref 11.7–15.4)
WBC: 6.8 10*3/uL (ref 3.4–10.8)

## 2019-10-06 LAB — HEMOGLOBIN A1C
Est. average glucose Bld gHb Est-mCnc: 126 mg/dL
Hgb A1c MFr Bld: 6 % — ABNORMAL HIGH (ref 4.8–5.6)

## 2019-10-07 ENCOUNTER — Telehealth: Payer: Self-pay

## 2019-10-07 NOTE — Telephone Encounter (Signed)
-----   Message from Mar Daring, [place] sent at 10/06/2019  6:27 PM EST ----- Blood count is back to normal. Anemia resolved currently. Kidney function is improved. Liver enzymes are normal. Sodium, potassium and calcium are normal. Iron stores are normal currently. Can stop any iron supplements at this time. A1c/sugar is much improved. A1c is down to 6.0, which is back in prediabetic range. Continue healthy lifestyle modifications.

## 2019-10-07 NOTE — Telephone Encounter (Signed)
Pt advised.   Thanks,   -Adiel Erney  

## 2019-10-29 ENCOUNTER — Other Ambulatory Visit: Payer: Self-pay | Admitting: Physician Assistant

## 2019-10-29 DIAGNOSIS — E034 Atrophy of thyroid (acquired): Secondary | ICD-10-CM

## 2019-10-29 DIAGNOSIS — J0140 Acute pansinusitis, unspecified: Secondary | ICD-10-CM

## 2019-11-06 ENCOUNTER — Other Ambulatory Visit: Payer: Self-pay | Admitting: Physician Assistant

## 2019-11-06 DIAGNOSIS — E119 Type 2 diabetes mellitus without complications: Secondary | ICD-10-CM

## 2019-11-09 NOTE — Progress Notes (Signed)
Subjective:   Shonte Beutler is a 49 y.o. female who presents for an Initial Medicare Annual Wellness Visit.    This visit is being conducted through telemedicine due to the COVID-19 pandemic. This patient has given me verbal consent via doximity to conduct this visit, patient states they are participating from their home address. Some vital signs may be absent or patient reported.    Patient identification: identified by name, DOB, and current address  Review of Systems    N/A        Objective:    Today's Vitals   11/10/19 0931  PainSc: 8    There is no height or weight on file to calculate BMI. Unable to obtain vitals due to visit being conducted via telephonically.   Advanced Directives 11/10/2019 01/06/2019 01/05/2019 08/15/2018 04/09/2017 01/15/2015  Does Patient Have a Medical Advance Directive? No No No No No No  Would patient like information on creating a medical advance directive? No - Patient declined No - Patient declined No - Patient declined No - Patient declined - No - patient declined information  Some encounter information is confidential and restricted. Go to Review Flowsheets activity to see all data.    Current Medications (verified) Outpatient Encounter Medications as of 11/10/2019  Medication Sig  . ACCU-CHEK AVIVA PLUS test strip CHECK BLOOD SUGAR ONE TIME DAILY  . Accu-Chek Softclix Lancets lancets Use as instructed  . Alcohol Swabs (B-D SINGLE USE SWABS REGULAR) PADS USE TO CLEANSE SKIN DAILY BEFORE CHECKING BLOOD SUGAR  . ALPRAZolam (XANAX) 1 MG tablet Take 1 mg by mouth 2 (two) times daily as needed.   Marland Kitchen amphetamine-dextroamphetamine (ADDERALL) 20 MG tablet Take 20 mg by mouth. Has not started yet 11/10/19  . atorvastatin (LIPITOR) 10 MG tablet Take 1 tablet (10 mg total) by mouth daily.  . Blood Glucose Monitoring Suppl (ACCU-CHEK AVIVA PLUS) w/Device KIT To check blood sugar once daily  . DULoxetine (CYMBALTA) 60 MG capsule Take 2 capsules (120 mg total) by  mouth daily.  . fluticasone (FLONASE) 50 MCG/ACT nasal spray USE 2 SPRAYS INTO BOTH NOSTRILS DAILY.  . folic acid (FOLVITE) 1 MG tablet Take 1 tablet (1 mg total) daily by mouth.  . hydrOXYzine (ATARAX/VISTARIL) 50 MG tablet Take 1 tablet (50 mg total) by mouth 3 (three) times daily as needed for anxiety.  . Lancets Misc. (ACCU-CHEK FASTCLIX LANCET) KIT To check blood sugar once daily  . levothyroxine (SYNTHROID) 25 MCG tablet TAKE 1 TABLET EVERY DAY BEFORE BREAKFAST  . pregabalin (LYRICA) 200 MG capsule Take 1 capsule (200 mg total) by mouth 2 (two) times daily.  . QUEtiapine Fumarate (SEROQUEL XR) 150 MG 24 hr tablet Take 150 mg by mouth at bedtime.   . risperiDONE (RISPERDAL) 1 MG tablet Take 1 tablet (1 mg total) by mouth at bedtime.  . topiramate (TOPAMAX) 200 MG tablet Take 1 tablet (200 mg total) by mouth at bedtime.  . traZODone (DESYREL) 150 MG tablet Take 1 tablet (150 mg total) by mouth at bedtime.  . Vitamin D, Ergocalciferol, (DRISDOL) 1.25 MG (50000 UT) CAPS capsule TAKE 1 CAPSULE EVERY 7 DAYS  . albuterol (VENTOLIN HFA) 108 (90 Base) MCG/ACT inhaler INHALE 2 PUFFS INTO THE LUNGS EVERY 4 HOURS AS NEEDED FOR WHEEZING OR SHORTNESS OF BREATH. (Patient not taking: Reported on 10/05/2019)  . D-5000 125 MCG (5000 UT) TABS Take 1 tablet by mouth once a week.  . ferrous sulfate 325 (65 FE) MG tablet Take 325 mg by mouth  daily with breakfast.  . furosemide (LASIX) 40 MG tablet Take 1 tablet (40 mg total) by mouth daily. (Patient not taking: Reported on 10/05/2019)  . oxyCODONE (OXY IR/ROXICODONE) 5 MG immediate release tablet Take 1 tablet (5 mg total) by mouth every 6 (six) hours as needed for severe pain or breakthrough pain. (Patient not taking: Reported on 10/05/2019)   No facility-administered encounter medications on file as of 11/10/2019.    Allergies (verified) Metformin and related   History: Past Medical History:  Diagnosis Date  . ADD (attention deficit disorder)   . Anxiety   .  Back pain   . Cholecystitis, acute 01/06/2019  . Concussion 2001   head trauma-work related. Caused headaches and short term memory loss.  . Depression   . Diabetes mellitus without complication ([place])   . Overactive bladder   . Recurrent vaginitis    Past Surgical History:  Procedure Laterality Date  . ankle pin and plates placement     right  . CHOLECYSTECTOMY N/A 01/06/2019   Procedure: LAPAROSCOPIC CHOLECYSTECTOMY;  Surgeon: Vickie Epley, MD;  Location: [facility] ORS;  Service: General;  Laterality: N/A;  . COSMETIC SURGERY  2005   BREASTS, ARM, ABDOMEN AFTER WEIGHT LOSS   Family History  Problem Relation Age of Onset  . Diabetes Mother   . Hypertension Mother   . Depression Mother   . Diabetes Father   . Hypertension Father   . Anxiety disorder Father   . Depression Father   . ADD / ADHD Father   . Depression Sister   . Bipolar disorder Sister   . ADD / ADHD Brother    Social History   Socioeconomic History  . Marital status: Divorced    Spouse name: Not on file  . Number of children: 0  . Years of education: Not on file  . Highest education level: Bachelor's degree (e.g., BA, AB, BS)  Occupational History  . Occupation: disability  Tobacco Use  . Smoking status: Never Smoker  . Smokeless tobacco: Never Used  Substance and Sexual Activity  . Alcohol use: No    Alcohol/week: 0.0 standard drinks  . Drug use: No  . Sexual activity: Yes    Birth control/protection: None  Other Topics Concern  . Not on file  Social History Narrative  . Not on file   Social Determinants of Health   Financial Resource Strain: High Risk  . Difficulty of Paying Living Expenses: Hard  Food Insecurity: Food Insecurity Present  . Worried About Charity fundraiser in the Last Year: Often true  . Ran Out of Food in the Last Year: Often true  Transportation Needs: No Transportation Needs  . Lack of Transportation (Medical): No  . Lack of Transportation (Non-Medical): No  Physical  Activity: Inactive  . Days of Exercise per Week: 0 days  . Minutes of Exercise per Session: 0 min  Stress: Stress Concern Present  . Feeling of Stress : Very much  Social Connections: Severely Isolated  . Frequency of Communication with Friends and Family: Once a week  . Frequency of Social Gatherings with Friends and Family: Once a week  . Attends Religious Services: Never  . Active Member of Clubs or Organizations: No  . Attends Archivist Meetings: Never  . Marital Status: Divorced    Tobacco Counseling Counseling given: Not Answered   Clinical Intake:  Pre-visit preparation completed: Yes  Pain : 0-10 Pain Score: 8  Pain Type: Acute pain Pain Location: Back  Pain Orientation: Lower Pain Descriptors / Indicators: Aching Pain Onset: 1 to 4 weeks ago Pain Frequency: Constant     Nutritional Risks: None Diabetes: Yes  How often do you need to have someone help you when you read instructions, pamphlets, or other written materials from your doctor or pharmacy?: 1 - Never   Diabetes:  Is the patient diabetic?  Yes  If diabetic, was a CBG obtained today?  No  Did the patient bring in their glucometer from home?  No  How often do you monitor your CBG's? Once a day in the morning.   Financial Strains and Diabetes Management:  Are you having any financial strains with the device, your supplies or your medication? No .  Does the patient want to be seen by Chronic Care Management for management of their diabetes?  No  Would the patient like to be referred to a Nutritionist or for Diabetic Management?  No   Diabetic Exams:  Diabetic Eye Exam: Completed 01/28/19. Repeat yearly.  Diabetic Foot Exam: Completed 07/21/18. Pt has been advised about the importance in completing this exam. Note made to follow up on this at next in office apt.    Interpreter Needed?: No  Information entered by :: [facility], LPN   Activities of Daily Living In your present state of  health, do you have any difficulty performing the following activities: 11/10/2019 01/06/2019  Hearing? N N  Vision? N N  Difficulty concentrating or making decisions? N N  Walking or climbing stairs? Y N  Comment Due to back pain. -  Dressing or bathing? N N  Doing errands, shopping? N N  Preparing Food and eating ? N -  Using the Toilet? N -  In the past six months, have you accidently leaked urine? Y -  Comment Occasionally with urges. -  Do you have problems with loss of bowel control? N -  Managing your Medications? N -  Managing your Finances? N -  Housekeeping or managing your Housekeeping? N -  Some recent data might be hidden     Immunizations and Health Maintenance Immunization History  Administered Date(s) Administered  . Influenza,inj,Quad PF,6+ Mos 05/09/2017, 05/21/2018  . Pneumococcal Polysaccharide-23 07/21/2018   Health Maintenance Due  Topic Date Due  . FOOT EXAM  07/22/2019  . URINE MICROALBUMIN  07/22/2019  . MAMMOGRAM  07/27/2019  . COLONOSCOPY  Never done    Patient Care Team: Mar Daring, PA-C as PCP - General (Family Medicine) [place] Crane, MD as Consulting Physician (Psychiatry) Lavonia Dana, MD as Consulting Physician (Nephrology) Chinita Pester, MD as Referring Physician (Anesthesiology) Lorelee Cover., MD (Ophthalmology)  Indicate any recent Medical Services you may have received from other than [facility] providers in the past year (date may be approximate).     Assessment:   This is a routine wellness examination for Anysa.  Hearing/Vision screen No exam data present  Dietary issues and exercise activities discussed: Current Exercise Habits: The patient does not participate in regular exercise at present, Exercise limited by: None identified  Goals    . Prevent falls     Recommend to remove any items from the home that may cause slips or trips.      Depression Screen PHQ 2/9 Scores 07/08/2019 11/28/2018 05/09/2017  PHQ  - 2 Score '3 2 2  ' PHQ- 9 Score '7 13 13    ' Fall Risk Fall Risk  11/10/2019 05/09/2017  Falls in the past year? 1 No  Number falls  in past yr: 1 -  Injury with Fall? 0 -  Risk for fall due to : Other (Comment) -  Risk for fall due to: Comment Due to dizziness and knees are giving out. -  Follow up Falls prevention discussed -    FALL RISK PREVENTION PERTAINING TO THE HOME:  Any stairs in or around the home? Yes If so, are there any without handrails? No, referral sent for a ramp needed on the back porch.   Home free of loose throw rugs in walkways, pet beds, electrical cords, etc? Yes  Adequate lighting in your home to reduce risk of falls? Yes   ASSISTIVE DEVICES UTILIZED TO PREVENT FALLS:  Life alert? No  Use of a cane, walker or w/c? Yes  Grab bars in the bathroom? No  Shower chair or bench in shower? Yes  Elevated toilet seat or a handicapped toilet? No    TIMED UP AND GO:  Was the test performed? No .     Cognitive Function: Declined today.        Screening Tests Health Maintenance  Topic Date Due  . FOOT EXAM  07/22/2019  . URINE MICROALBUMIN  07/22/2019  . MAMMOGRAM  07/27/2019  . COLONOSCOPY  Never done  . TETANUS/TDAP  11/09/2020 (Originally 07/26/1988)  . OPHTHALMOLOGY EXAM  01/28/2020  . INFLUENZA VACCINE  02/28/2020  . HEMOGLOBIN A1C  04/06/2020  . PAP SMEAR-Modifier  07/07/2022  . PNEUMOCOCCAL POLYSACCHARIDE VACCINE AGE 65-64 HIGH RISK  Completed  . HIV Screening  Completed    Qualifies for Shingles Vaccine? No    Tdap: Although this vaccine is not a covered service during a Wellness Exam, does the patient still wish to receive this vaccine today?  No . Advised may receive this vaccine at local pharmacy or Health Dept. Aware to provide a copy of the vaccination record if obtained from local pharmacy or Health Dept. Verbalized acceptance and understanding.  Flu Vaccine: Due fall 2021.   Cancer Screenings:  Colorectal Screening: Currently due.  Referral to GI placed today. Pt aware the office will call re: appt.  Mammogram: Completed 01/22/06. Repeat every 1-2 years as advised. Ordered 07/08/19. Pt plans to call and set up apt this year.  Lung Cancer Screening: (Low Dose CT Chest recommended if Age 25-80 years, 30 pack-year currently smoking OR have quit w/in 15years.) does not qualify.   Additional Screening:   Dental Screening: Recommended annual dental exams for proper oral hygiene  Community Resource Referral:  CRR required this visit? Yes, referral sent to [facility] team to help with financial assistance with bills and for a ramp that's needed on pt's back porch.     Plan:  I have personally reviewed and addressed the Medicare Annual Wellness questionnaire and have noted the following in the patient's chart:  A. Medical and social history B. Use of alcohol, tobacco or illicit drugs  C. Current medications and supplements D. Functional ability and status E.  Nutritional status F.  Physical activity G. Advance directives H. List of other physicians I.  Hospitalizations, surgeries, and ER visits in previous 12 months J.  [place] such as hearing and vision if needed, cognitive and depression L. Referrals and appointments   In addition, I have reviewed and discussed with patient certain preventive protocols, quality metrics, and best practice recommendations. A written personalized care plan for preventive services as well as general preventive health recommendations were provided to patient.   Signed,    Fabio Neighbors, LPN  11/10/2019  Nurse Health Advisor    Nurse Notes: Pt needs a diabetic foot exam and urine check at next in office apt. Pt plans to call and set up mammogram this year.

## 2019-11-10 ENCOUNTER — Ambulatory Visit (INDEPENDENT_AMBULATORY_CARE_PROVIDER_SITE_OTHER): Payer: Medicare HMO

## 2019-11-10 ENCOUNTER — Other Ambulatory Visit: Payer: Self-pay

## 2019-11-10 DIAGNOSIS — Z1211 Encounter for screening for malignant neoplasm of colon: Secondary | ICD-10-CM | POA: Diagnosis not present

## 2019-11-10 DIAGNOSIS — Z0000 Encounter for general adult medical examination without abnormal findings: Secondary | ICD-10-CM | POA: Diagnosis not present

## 2019-11-10 NOTE — Patient Instructions (Signed)
[name] , Thank you for taking time to come for your Medicare Wellness Visit. I appreciate your ongoing commitment to your health goals. Please review the following plan we discussed and let me know if I can assist you in the future.   Screening recommendations/referrals: Colonoscopy: Referral to GI placed today. Pt aware the office will call re: appt. Mammogram: Ordered 06/2019. Pt to call and set up apt this year. Recommended yearly ophthalmology/optometry visit for glaucoma screening and checkup Recommended yearly dental visit for hygiene and checkup  Vaccinations: Influenza vaccine: Due fall 2021 Tdap vaccine: Pt declines today.   Advanced directives: Advance directive discussed with you today. Even though you declined this today please call our office should you change your mind and we can give you the proper paperwork for you to fill out.  Conditions/risks identified: Fall risk prevention discussed today.   Next appointment: 02/04/20@ 2:00 PM with [name] 51 Years and Older, Female Preventive care refers to lifestyle choices and visits with your health care provider that can promote health and wellness. What does preventive care include?  A yearly physical exam. This is also called an annual well check.  Dental exams once or twice a year.  Routine eye exams. Ask your health care provider how often you should have your eyes checked.  Personal lifestyle choices, including:  Daily care of your teeth and gums.  Regular physical activity.  Eating a healthy diet.  Avoiding tobacco and drug use.  Limiting alcohol use.  Practicing safe sex.  Taking low-dose aspirin every day.  Taking vitamin and mineral supplements as recommended by your health care provider. What happens during an annual well check? The services and screenings done by your health care provider during your annual well check will depend on your age, overall health, lifestyle  risk factors, and family history of disease. Counseling  Your health care provider may ask you questions about your:  Alcohol use.  Tobacco use.  Drug use.  Emotional well-being.  Home and relationship well-being.  Sexual activity.  Eating habits.  History of falls.  Memory and ability to understand (cognition).  Work and work Statistician.  Reproductive health. Screening  You may have the following tests or measurements:  Height, weight, and BMI.  Blood pressure.  Lipid and cholesterol levels. These may be checked every 5 years, or more frequently if you are over 51 years old.  Skin check.  Lung cancer screening. You may have this screening every year starting at age 51 if you have a 30-pack-year history of smoking and currently smoke or have quit within the past 15 years.  Fecal occult blood test (FOBT) of the stool. You may have this test every year starting at age 51.  Flexible sigmoidoscopy or colonoscopy. You may have a sigmoidoscopy every 5 years or a colonoscopy every 10 years starting at age 51.  Hepatitis C blood test.  Hepatitis B blood test.  Sexually transmitted disease (STD) testing.  Diabetes screening. This is done by checking your blood sugar (glucose) after you have not eaten for a while (fasting). You may have this done every 1-3 years.  Bone density scan. This is done to screen for osteoporosis. You may have this done starting at age 51.  Mammogram. This may be done every 1-2 years. Talk to your health care provider about how often you should have regular mammograms. Talk with your health care provider about your test results, treatment options, and if necessary,  the need for more tests. Vaccines  Your health care provider may recommend certain vaccines, such as:  Influenza vaccine. This is recommended every year.  Tetanus, diphtheria, and acellular pertussis (Tdap, Td) vaccine. You may need a Td booster every 10 years.  Zoster vaccine.  You may need this after age 51.  Pneumococcal 13-valent conjugate (PCV13) vaccine. One dose is recommended after age 51.  Pneumococcal polysaccharide (PPSV23) vaccine. One dose is recommended after age 51. Talk to your health care provider about which screenings and vaccines you need and how often you need them. This information is not intended to replace advice given to you by your health care provider. Make sure you discuss any questions you have with your health care provider. Document Released: 08/12/2015 Document Revised: 04/04/2016 Document Reviewed: 05/17/2015 Elsevier Interactive Patient Education  2017 [place] Prevention in the Home Falls can cause injuries. They can happen to people of all ages. There are many things you can do to make your home safe and to help prevent falls. What can I do on the outside of my home?  Regularly fix the edges of walkways and driveways and fix any cracks.  Remove anything that might make you trip as you walk through a door, such as a raised step or threshold.  Trim any bushes or trees on the path to your home.  Use bright outdoor lighting.  Clear any walking paths of anything that might make someone trip, such as rocks or tools.  Regularly check to see if handrails are loose or broken. Make sure that both sides of any steps have handrails.  Any raised decks and porches should have guardrails on the edges.  Have any leaves, snow, or ice cleared regularly.  Use sand or salt on walking paths during winter.  Clean up any spills in your garage right away. This includes oil or grease spills. What can I do in the bathroom?  Use night lights.  Install grab bars by the toilet and in the tub and shower. Do not use towel bars as grab bars.  Use non-skid mats or decals in the tub or shower.  If you need to sit down in the shower, use a plastic, non-slip stool.  Keep the floor dry. Clean up any water that spills on the floor as soon  as it happens.  Remove soap buildup in the tub or shower regularly.  Attach bath mats securely with double-sided non-slip rug tape.  Do not have throw rugs and other things on the floor that can make you trip. What can I do in the bedroom?  Use night lights.  Make sure that you have a light by your bed that is easy to reach.  Do not use any sheets or blankets that are too big for your bed. They should not hang down onto the floor.  Have a firm chair that has side arms. You can use this for support while you get dressed.  Do not have throw rugs and other things on the floor that can make you trip. What can I do in the kitchen?  Clean up any spills right away.  Avoid walking on wet floors.  Keep items that you use a lot in easy-to-reach places.  If you need to reach something above you, use a strong step stool that has a grab bar.  Keep electrical cords out of the way.  Do not use floor polish or wax that makes floors slippery. If you must use  wax, use non-skid floor wax.  Do not have throw rugs and other things on the floor that can make you trip. What can I do with my stairs?  Do not leave any items on the stairs.  Make sure that there are handrails on both sides of the stairs and use them. Fix handrails that are broken or loose. Make sure that handrails are as long as the stairways.  Check any carpeting to make sure that it is firmly attached to the stairs. Fix any carpet that is loose or worn.  Avoid having throw rugs at the top or bottom of the stairs. If you do have throw rugs, attach them to the floor with carpet tape.  Make sure that you have a light switch at the top of the stairs and the bottom of the stairs. If you do not have them, ask someone to add them for you. What else can I do to help prevent falls?  Wear shoes that:  Do not have high heels.  Have rubber bottoms.  Are comfortable and fit you well.  Are closed at the toe. Do not wear sandals.  If  you use a stepladder:  Make sure that it is fully opened. Do not climb a closed stepladder.  Make sure that both sides of the stepladder are locked into place.  Ask someone to hold it for you, if possible.  Clearly mark and make sure that you can see:  Any grab bars or handrails.  First and last steps.  Where the edge of each step is.  Use tools that help you move around (mobility aids) if they are needed. These include:  Canes.  Walkers.  Scooters.  Crutches.  Turn on the lights when you go into a dark area. Replace any light bulbs as soon as they burn out.  Set up your furniture so you have a clear path. Avoid moving your furniture around.  If any of your floors are uneven, fix them.  If there are any pets around you, be aware of where they are.  Review your medicines with your doctor. Some medicines can make you feel dizzy. This can increase your chance of falling. Ask your doctor what other things that you can do to help prevent falls. This information is not intended to replace advice given to you by your health care provider. Make sure you discuss any questions you have with your health care provider. Document Released: 05/12/2009 Document Revised: 12/22/2015 Document Reviewed: 08/20/2014 Elsevier Interactive Patient Education  2017 [employer].

## 2019-11-17 ENCOUNTER — Telehealth (INDEPENDENT_AMBULATORY_CARE_PROVIDER_SITE_OTHER): Payer: Self-pay | Admitting: Gastroenterology

## 2019-11-17 ENCOUNTER — Other Ambulatory Visit: Payer: Self-pay

## 2019-11-17 DIAGNOSIS — Z1211 Encounter for screening for malignant neoplasm of colon: Secondary | ICD-10-CM

## 2019-11-17 NOTE — Progress Notes (Signed)
Gastroenterology Pre-Procedure Review  Request Date: Friday 12/11/19 Requesting Physician: Dr. Marius Ditch  PATIENT REVIEW QUESTIONS: The patient responded to the following health history questions as indicated:    1. Are you having any GI issues? no 2. Do you have a personal history of Polyps? no 3. Do you have a family history of Colon Cancer or Polyps? no 4. Diabetes Mellitus? no 5. Joint replacements in the past 12 months?no 6. Major health problems in the past 3 months?no 7. Any artificial heart valves, MVP, or defibrillator?no    MEDICATIONS & ALLERGIES:    Patient reports the following regarding taking any anticoagulation/antiplatelet therapy:   Plavix, Coumadin, Eliquis, Xarelto, Lovenox, Pradaxa, Brilinta, or Effient? no Aspirin? no  Patient confirms/reports the following medications:  Current Outpatient Medications  Medication Sig Dispense Refill  . Alcohol Swabs (B-D SINGLE USE SWABS REGULAR) PADS USE TO CLEANSE SKIN DAILY BEFORE CHECKING BLOOD SUGAR 100 each 1  . ALPRAZolam (XANAX) 1 MG tablet Take 1 mg by mouth 2 (two) times daily as needed.     Marland Kitchen amphetamine-dextroamphetamine (ADDERALL) 20 MG tablet Take 20 mg by mouth. Has not started yet 11/10/19    . atorvastatin (LIPITOR) 10 MG tablet Take 1 tablet (10 mg total) by mouth daily. 90 tablet 3  . Blood Glucose Monitoring Suppl (ACCU-CHEK AVIVA PLUS) w/Device KIT To check blood sugar once daily 1 kit 0  . D-5000 125 MCG (5000 UT) TABS Take 1 tablet by mouth once a week.    . DULoxetine (CYMBALTA) 60 MG capsule Take 2 capsules (120 mg total) by mouth daily. 60 capsule 0  . fluticasone (FLONASE) 50 MCG/ACT nasal spray USE 2 SPRAYS INTO BOTH NOSTRILS DAILY. 48 g 1  . folic acid (FOLVITE) 1 MG tablet Take 1 tablet (1 mg total) daily by mouth. 90 tablet 1  . hydrOXYzine (ATARAX/VISTARIL) 50 MG tablet Take 1 tablet (50 mg total) by mouth 3 (three) times daily as needed for anxiety. 90 tablet 1  . Lancets Misc. (ACCU-CHEK FASTCLIX  LANCET) KIT To check blood sugar once daily 1 kit 0  . levothyroxine (SYNTHROID) 25 MCG tablet TAKE 1 TABLET EVERY DAY BEFORE BREAKFAST 90 tablet 0  . pregabalin (LYRICA) 200 MG capsule Take 1 capsule (200 mg total) by mouth 2 (two) times daily. 180 capsule 1  . QUEtiapine Fumarate (SEROQUEL XR) 150 MG 24 hr tablet Take 150 mg by mouth at bedtime.     . risperiDONE (RISPERDAL) 1 MG tablet Take 1 tablet (1 mg total) by mouth at bedtime. 30 tablet 1  . topiramate (TOPAMAX) 200 MG tablet Take 1 tablet (200 mg total) by mouth at bedtime. 30 tablet 1  . traZODone (DESYREL) 150 MG tablet Take 1 tablet (150 mg total) by mouth at bedtime. 30 tablet 1  . Vitamin D, Ergocalciferol, (DRISDOL) 1.25 MG (50000 UT) CAPS capsule TAKE 1 CAPSULE EVERY 7 DAYS 12 capsule 1  . ACCU-CHEK AVIVA PLUS test strip CHECK BLOOD SUGAR ONE TIME DAILY 100 strip 0  . Accu-Chek Softclix Lancets lancets Use as instructed 100 each 12  . albuterol (VENTOLIN HFA) 108 (90 Base) MCG/ACT inhaler INHALE 2 PUFFS INTO THE LUNGS EVERY 4 HOURS AS NEEDED FOR WHEEZING OR SHORTNESS OF BREATH. (Patient not taking: Reported on 10/05/2019) 18 g 3  . ferrous sulfate 325 (65 FE) MG tablet Take 325 mg by mouth daily with breakfast.    . furosemide (LASIX) 40 MG tablet Take 1 tablet (40 mg total) by mouth daily. (Patient not taking:  Reported on 10/05/2019) 90 tablet 3  . oxyCODONE (OXY IR/ROXICODONE) 5 MG immediate release tablet Take 1 tablet (5 mg total) by mouth every 6 (six) hours as needed for severe pain or breakthrough pain. (Patient not taking: Reported on 10/05/2019) 15 tablet 0   No current facility-administered medications for this visit.    Patient confirms/reports the following allergies:  Allergies  Allergen Reactions  . Metformin And Related Diarrhea    No orders of the defined types were placed in this encounter.   AUTHORIZATION INFORMATION Primary Insurance: 1D#: Group #:  Secondary Insurance: 1D#: Group #:  SCHEDULE  INFORMATION: Date: Friday 12/11/19 Time: Location:[facility]

## 2019-11-25 ENCOUNTER — Telehealth: Payer: Self-pay | Admitting: Physician Assistant

## 2019-11-25 NOTE — Chronic Care Management (AMB) (Signed)
Chronic Care Management   Note  11/25/2019 Name: Barbara Anderson MRN: [id number] DOB: [birth year]  Barbara Anderson is a 49 y.o. year old female who is a primary care patient of Barbara Anderson. I reached out to Barbara Anderson by phone today in response to a referral sent by Barbara Anderson's health plan.     Barbara Anderson was given information about Chronic Care Management services today including:  1. CCM service includes personalized support from designated clinical staff supervised by her physician, including individualized plan of care and coordination with other care providers 2. 24/7 contact phone numbers for assistance for urgent and routine care needs. 3. Service will only be billed when office clinical staff spend 20 minutes or more in a month to coordinate care. 4. Only one practitioner may furnish and bill the service in a calendar month. 5. The patient may stop CCM services at any time (effective at the end of the month) by phone call to the office staff. 6. The patient will be responsible for cost sharing (co-pay) of up to 20% of the service fee (after annual deductible is met).  Patient agreed to services and verbal consent obtained.   Follow up plan: Telephone appointment with care management team member scheduled for:12/14/2019  Barbara Anderson, [place], [place], [place] [address] Direct Dial: [phone] [email or web address] Website: [email or web address]

## 2019-12-07 DIAGNOSIS — M47817 Spondylosis without myelopathy or radiculopathy, lumbosacral region: Secondary | ICD-10-CM | POA: Diagnosis not present

## 2019-12-07 DIAGNOSIS — M47816 Spondylosis without myelopathy or radiculopathy, lumbar region: Secondary | ICD-10-CM | POA: Diagnosis not present

## 2019-12-08 ENCOUNTER — Other Ambulatory Visit: Payer: Self-pay | Admitting: Physician Assistant

## 2019-12-08 DIAGNOSIS — E1129 Type 2 diabetes mellitus with other diabetic kidney complication: Secondary | ICD-10-CM

## 2019-12-08 DIAGNOSIS — E119 Type 2 diabetes mellitus without complications: Secondary | ICD-10-CM

## 2019-12-08 NOTE — Telephone Encounter (Signed)
Requested Prescriptions  Pending Prescriptions Disp Refills  . Accu-Chek Softclix Lancets lancets [Pharmacy Med Name: ACCU-CHEK SOFTCLIX LANCETS   ] 100 each 12    Sig: TEST BLOOD SUGAR AS DIRECTED     Endocrinology: Diabetes - Testing Supplies Passed - 12/08/2019  3:48 AM      Passed - Valid encounter within last 12 months    Recent Outpatient Visits          2 months ago [place] Malling M, PA-C   11 months ago Type 2 diabetes mellitus with microalbuminuria, without long-term current use of insulin [facility])   [place], [place], PA-C   1 year ago Type 2 diabetes mellitus with microalbuminuria, without long-term current use of insulin [facility])   [facility] [place], [place], [place]   1 year ago Hypothyroidism due to acquired atrophy of thyroid   [facility] [place], [place], [place]   1 year ago Acute non-recurrent pansinusitis   [facility] [place], Anderson Malta M, [place]             . metFORMIN (GLUCOPHAGE) 500 MG tablet [Pharmacy Med Name: METFORMIN HYDROCHLORIDE 500 MG Tablet] 180 tablet 1    Sig: TAKE 1 [place]     Endocrinology:  Diabetes - Biguanides Failed - 12/08/2019  3:48 AM      Failed - Cr in normal range and within 360 days    Creatinine  Date Value Ref Range Status  05/12/2014 0.88 0.60 - 1.30 mg/dL Final   Creat  Date Value Ref Range Status  04/22/2014 0.79 0.50 - 1.10 mg/dL Final   Creatinine, Ser  Date Value Ref Range Status  10/05/2019 1.04 (H) 0.57 - 1.00 mg/dL Final         Passed - HBA1C is between 0 and 7.9 and within 180 days    Hgb A1c MFr Bld  Date Value Ref Range Status  10/05/2019 6.0 (H) 4.8 - 5.6 % Final    Comment:             Prediabetes: 5.7 - 6.4          Diabetes: >6.4          Glycemic control for adults with diabetes: <7.0          Passed - eGFR in normal range and within 360 days    EGFR (African  American)  Date Value Ref Range Status  10/21/2013 >60  Final   GFR calc Af Amer  Date Value Ref Range Status  10/05/2019 72 >59 mL/min/1.73 Final   EGFR (Non-African Amer.)  Date Value Ref Range Status  10/21/2013 >60  Final    Comment:    eGFR values <82m/min/1.73 m2 may be an indication of chronic kidney disease (CKD). Calculated eGFR is useful in patients with stable renal function. The eGFR calculation will not be reliable in acutely ill patients when serum creatinine is changing rapidly. It is not useful in  patients on dialysis. The eGFR calculation may not be applicable to patients at the low and high extremes of body sizes, pregnant women, and vegetarians.    GFR calc non Af Amer  Date Value Ref Range Status  10/05/2019 63 >59 mL/min/1.73 Final         Passed - Valid encounter within last 6 months    Recent Outpatient Visits          2 months ago Dizziness  [place], [place]   11 months ago Type 2 diabetes mellitus with microalbuminuria, without long-term current use of insulin [facility])   [place], [place], [place]   1 year ago Type 2 diabetes mellitus with microalbuminuria, without long-term current use of insulin [facility])   [place], [place], [place]   1 year ago Hypothyroidism due to acquired atrophy of thyroid   [place], Clearnce Sorrel, [place]   1 year ago Acute non-recurrent pansinusitis   [facility] [place], [place], [place]

## 2019-12-09 ENCOUNTER — Other Ambulatory Visit
Admission: RE | Admit: 2019-12-09 | Discharge: 2019-12-09 | Disposition: A | Discharge status: Home / Self Care | Payer: Medicare HMO | Source: Ambulatory Visit | Attending: Gastroenterology | Admitting: Gastroenterology

## 2019-12-09 DIAGNOSIS — Z01812 Encounter for preprocedural laboratory examination: Secondary | ICD-10-CM | POA: Insufficient documentation

## 2019-12-09 DIAGNOSIS — Z20822 Contact with and (suspected) exposure to covid-19: Secondary | ICD-10-CM | POA: Diagnosis not present

## 2019-12-09 LAB — SARS CORONAVIRUS 2 (TAT 6-24 HRS): SARS Coronavirus 2: NEGATIVE

## 2019-12-10 ENCOUNTER — Encounter: Payer: Self-pay | Admitting: Gastroenterology

## 2019-12-11 ENCOUNTER — Telehealth: Payer: Self-pay

## 2019-12-11 ENCOUNTER — Encounter: Payer: Self-pay | Admitting: Gastroenterology

## 2019-12-11 ENCOUNTER — Encounter
Admission: RE | Disposition: A | Discharge status: Home / Self Care | Payer: Self-pay | Source: Home / Self Care | Attending: Gastroenterology

## 2019-12-11 ENCOUNTER — Ambulatory Visit: Payer: Medicare HMO | Admitting: Anesthesiology

## 2019-12-11 ENCOUNTER — Other Ambulatory Visit: Payer: Self-pay

## 2019-12-11 ENCOUNTER — Ambulatory Visit
Admission: RE | Admit: 2019-12-11 | Discharge: 2019-12-11 | Disposition: A | Discharge status: Home / Self Care | Payer: Medicare HMO | Attending: Gastroenterology | Admitting: Gastroenterology

## 2019-12-11 DIAGNOSIS — F329 Major depressive disorder, single episode, unspecified: Secondary | ICD-10-CM | POA: Diagnosis not present

## 2019-12-11 DIAGNOSIS — F419 Anxiety disorder, unspecified: Secondary | ICD-10-CM | POA: Insufficient documentation

## 2019-12-11 DIAGNOSIS — Z7984 Long term (current) use of oral hypoglycemic drugs: Secondary | ICD-10-CM | POA: Insufficient documentation

## 2019-12-11 DIAGNOSIS — E119 Type 2 diabetes mellitus without complications: Secondary | ICD-10-CM | POA: Diagnosis not present

## 2019-12-11 DIAGNOSIS — K644 Residual hemorrhoidal skin tags: Secondary | ICD-10-CM | POA: Insufficient documentation

## 2019-12-11 DIAGNOSIS — K635 Polyp of colon: Secondary | ICD-10-CM

## 2019-12-11 DIAGNOSIS — Z818 Family history of other mental and behavioral disorders: Secondary | ICD-10-CM | POA: Diagnosis not present

## 2019-12-11 DIAGNOSIS — F988 Other specified behavioral and emotional disorders with onset usually occurring in childhood and adolescence: Secondary | ICD-10-CM | POA: Diagnosis not present

## 2019-12-11 DIAGNOSIS — D123 Benign neoplasm of transverse colon: Secondary | ICD-10-CM | POA: Insufficient documentation

## 2019-12-11 DIAGNOSIS — Z79899 Other long term (current) drug therapy: Secondary | ICD-10-CM | POA: Diagnosis not present

## 2019-12-11 DIAGNOSIS — Z1211 Encounter for screening for malignant neoplasm of colon: Secondary | ICD-10-CM

## 2019-12-11 DIAGNOSIS — Z9049 Acquired absence of other specified parts of digestive tract: Secondary | ICD-10-CM | POA: Diagnosis not present

## 2019-12-11 DIAGNOSIS — I129 Hypertensive chronic kidney disease with stage 1 through stage 4 chronic kidney disease, or unspecified chronic kidney disease: Secondary | ICD-10-CM | POA: Diagnosis not present

## 2019-12-11 DIAGNOSIS — D122 Benign neoplasm of ascending colon: Secondary | ICD-10-CM | POA: Diagnosis not present

## 2019-12-11 DIAGNOSIS — Z833 Family history of diabetes mellitus: Secondary | ICD-10-CM | POA: Insufficient documentation

## 2019-12-11 DIAGNOSIS — D631 Anemia in chronic kidney disease: Secondary | ICD-10-CM | POA: Diagnosis not present

## 2019-12-11 DIAGNOSIS — E1122 Type 2 diabetes mellitus with diabetic chronic kidney disease: Secondary | ICD-10-CM | POA: Diagnosis not present

## 2019-12-11 DIAGNOSIS — N183 Chronic kidney disease, stage 3 unspecified: Secondary | ICD-10-CM | POA: Diagnosis not present

## 2019-12-11 HISTORY — PX: COLONOSCOPY WITH PROPOFOL: SHX5780

## 2019-12-11 LAB — POCT PREGNANCY, URINE: Preg Test, Ur: NEGATIVE

## 2019-12-11 SURGERY — COLONOSCOPY WITH PROPOFOL
Anesthesia: General

## 2019-12-11 MED ORDER — PROPOFOL 500 MG/50ML IV EMUL
INTRAVENOUS | Status: DC | PRN
  Administered 2019-12-11: 150 ug/kg/min via INTRAVENOUS

## 2019-12-11 MED ORDER — EPHEDRINE 5 MG/ML INJ
INTRAVENOUS | Status: AC
  Filled 2019-12-11: qty 10

## 2019-12-11 MED ORDER — MIDAZOLAM HCL 2 MG/2ML IJ SOLN
INTRAMUSCULAR | Status: DC | PRN
  Administered 2019-12-11: 2 mg via INTRAVENOUS

## 2019-12-11 MED ORDER — FENTANYL CITRATE (PF) 100 MCG/2ML IJ SOLN
INTRAMUSCULAR | Status: DC | PRN
  Administered 2019-12-11: 50 ug via INTRAVENOUS

## 2019-12-11 MED ORDER — SODIUM CHLORIDE 0.9 % IV SOLN
INTRAVENOUS | Status: DC
  Administered 2019-12-11: 1000 mL via INTRAVENOUS

## 2019-12-11 MED ORDER — EPHEDRINE SULFATE 50 MG/ML IJ SOLN
INTRAMUSCULAR | Status: DC | PRN
  Administered 2019-12-11: 10 mg via INTRAVENOUS

## 2019-12-11 MED ORDER — PROPOFOL 500 MG/50ML IV EMUL
INTRAVENOUS | Status: AC
  Filled 2019-12-11: qty 50

## 2019-12-11 MED ORDER — FENTANYL CITRATE (PF) 100 MCG/2ML IJ SOLN
INTRAMUSCULAR | Status: AC
  Filled 2019-12-11: qty 2

## 2019-12-11 MED ORDER — MIDAZOLAM HCL 2 MG/2ML IJ SOLN
INTRAMUSCULAR | Status: AC
  Filled 2019-12-11: qty 2

## 2019-12-11 NOTE — Transfer of Care (Signed)
Immediate Anesthesia Transfer of Care Note  Patient: Barbara Anderson  Procedure(s) Performed: COLONOSCOPY WITH PROPOFOL (N/A )  Patient Location: PACU  Anesthesia Type:General  Level of Consciousness: awake and sedated  Airway & Oxygen Therapy: Patient Spontanous Breathing and Patient connected to nasal cannula oxygen  Post-op Assessment: Report given to RN and Post -op Vital signs reviewed and stable  Post vital signs: Reviewed and stable  Last Vitals:  Vitals Value Taken Time  BP    Temp    Pulse    Resp    SpO2      Last Pain:  Vitals:   12/11/19 0746  TempSrc: Temporal  PainSc: 0-No pain         Complications: No apparent anesthesia complications

## 2019-12-11 NOTE — Anesthesia Postprocedure Evaluation (Signed)
Anesthesia Post Note  Patient: [name]  Procedure(s) Performed: COLONOSCOPY WITH PROPOFOL (N/A )  Patient location during evaluation: Endoscopy Anesthesia Type: General Level of consciousness: awake and alert Pain management: pain level controlled Vital Signs Assessment: post-procedure vital signs reviewed and stable Respiratory status: spontaneous breathing, nonlabored ventilation, respiratory function stable and patient connected to nasal cannula oxygen Cardiovascular status: blood pressure returned to baseline and stable Postop Assessment: no apparent nausea or vomiting Anesthetic complications: no     Last Vitals:  Vitals:   12/11/19 0746 12/11/19 0855  BP: (!) 154/100 (!) 106/54  Pulse: 77   Resp: 20   Temp: (!) 36.1 C (!) 36 C  SpO2: 100%     Last Pain:  Vitals:   12/11/19 0925  TempSrc:   PainSc: 0-No pain                 Martha Clan

## 2019-12-11 NOTE — Telephone Encounter (Signed)
Called and left a message for call back  

## 2019-12-11 NOTE — H&P (Signed)
Cephas Darby, MD [address]  [place]  [place], [place] [address]  Main: [phone]  Fax: [phone] Pager: [phone]  Primary Care Physician:  Mar Daring, PA-C Primary Gastroenterologist:  Dr. Cephas Darby  Pre-Procedure History & Physical: HPI:  Barbara Anderson is a 49 y.o. female is here for an colonoscopy.   Past Medical History:  Diagnosis Date  . ADD (attention deficit disorder)   . Anxiety   . Back pain   . Cholecystitis, acute 01/06/2019  . Concussion 2001   head trauma-work related. Caused headaches and short term memory loss.  . Depression   . Diabetes mellitus without complication ([place])   . Overactive bladder   . Recurrent vaginitis     Past Surgical History:  Procedure Laterality Date  . ankle pin and plates placement     right  . CHOLECYSTECTOMY N/A 01/06/2019   Procedure: LAPAROSCOPIC CHOLECYSTECTOMY;  Surgeon: Vickie Epley, MD;  Location: [facility] ORS;  Service: General;  Laterality: N/A;  . COSMETIC SURGERY  2005   BREASTS, ARM, ABDOMEN AFTER WEIGHT LOSS    Prior to Admission medications   Medication Sig Start Date End Date Taking? Authorizing Provider  albuterol (VENTOLIN HFA) 108 (90 Base) MCG/ACT inhaler INHALE 2 PUFFS INTO THE LUNGS EVERY 4 HOURS AS NEEDED FOR WHEEZING OR SHORTNESS OF BREATH. 05/25/19  Yes Burnette, Anderson Malta M, PA-C  ALPRAZolam Duanne Moron) 1 MG tablet Take 1 mg by mouth 2 (two) times daily as needed.  06/24/19  Yes [provider]  amphetamine-dextroamphetamine (ADDERALL) 20 MG tablet Take 20 mg by mouth. Has not started yet 11/10/19 10/26/19  Yes [provider]  atorvastatin (LIPITOR) 10 MG tablet Take 1 tablet (10 mg total) by mouth daily. 11/03/18  Yes Burnette, Jennifer M, PA-C  D-5000 125 MCG (5000 UT) TABS Take 1 tablet by mouth once a week. 02/11/19  Yes [provider]  DULoxetine (CYMBALTA) 60 MG capsule Take 2 capsules (120 mg total) by mouth daily. 04/15/17  Yes Pucilowska, Jolanta  B, MD  ferrous sulfate 325 (65 FE) MG tablet Take 325 mg by mouth daily with breakfast.   Yes [provider]  fluticasone (FLONASE) 50 MCG/ACT nasal spray USE 2 SPRAYS INTO BOTH NOSTRILS DAILY. 10/29/19  Yes Mar Daring, PA-C  folic acid (FOLVITE) 1 MG tablet Take 1 tablet (1 mg total) daily by mouth. 06/13/17  Yes Burnette, Clearnce Sor[place], PA-C  furosemide (LASIX) 40 MG tablet Take 1 tablet (40 mg total) by mouth daily. 11/03/18  Yes Mar Daring, PA-C  hydrOXYzine (ATARAX/VISTARIL) 50 MG tablet Take 1 tablet (50 mg total) by mouth 3 (three) times daily as needed for anxiety. 04/15/17  Yes Pucilowska, Jolanta B, MD  levothyroxine (SYNTHROID) 25 MCG tablet TAKE 1 TABLET EVERY DAY BEFORE BREAKFAST 10/29/19  Yes Mar Daring, PA-C  oxyCODONE (OXY IR/ROXICODONE) 5 MG immediate release tablet Take 1 tablet (5 mg total) by mouth every 6 (six) hours as needed for severe pain or breakthrough pain. 01/07/19  Yes Edison Simon R, PA-C  pregabalin (LYRICA) 200 MG capsule Take 1 capsule (200 mg total) by mouth 2 (two) times daily. 06/30/19  Yes Fenton Malling M, PA-C  QUEtiapine Fumarate (SEROQUEL XR) 150 MG 24 hr tablet Take 150 mg by mouth at bedtime.  06/21/19  Yes [provider]  risperiDONE (RISPERDAL) 1 MG tablet Take 1 tablet (1 mg total) by mouth at bedtime. 04/15/17  Yes Pucilowska, Jolanta B, MD  topiramate (TOPAMAX) 200 MG  tablet Take 1 tablet (200 mg total) by mouth at bedtime. 04/15/17  Yes Pucilowska, Jolanta B, MD  traZODone (DESYREL) 150 MG tablet Take 1 tablet (150 mg total) by mouth at bedtime. 04/15/17  Yes Pucilowska, Jolanta B, MD  Vitamin D, Ergocalciferol, (DRISDOL) 1.25 MG (50000 UT) CAPS capsule TAKE 1 CAPSULE EVERY 7 DAYS 06/22/19  Yes Burnette, Clearnce Sorrel, PA-C  ACCU-CHEK AVIVA PLUS test strip CHECK BLOOD SUGAR ONE TIME DAILY Patient not taking: Reported on 12/11/2019 11/06/19   Mar Daring, PA-C  Accu-Chek Softclix Lancets lancets TEST BLOOD  SUGAR AS DIRECTED Patient not taking: Reported on 12/11/2019 12/08/19   Mar Daring, PA-C  Alcohol Swabs (B-D SINGLE USE SWABS REGULAR) PADS USE TO CLEANSE SKIN DAILY BEFORE CHECKING BLOOD SUGAR 08/27/19   Mar Daring, PA-C  Blood Glucose Monitoring Suppl (ACCU-CHEK AVIVA PLUS) w/Device KIT To check blood sugar once daily Patient not taking: Reported on 12/11/2019 11/03/18   Mar Daring, PA-C  Lancets Misc. (ACCU-CHEK FASTCLIX LANCET) KIT To check blood sugar once daily 07/21/18   Mar Daring, PA-C    Allergies as of 11/17/2019 - Review Complete 11/17/2019  Allergen Reaction Noted  . Metformin and related Diarrhea 10/05/2019    Family History  Problem Relation Age of Onset  . Diabetes Mother   . Hypertension Mother   . Depression Mother   . Diabetes Father   . Hypertension Father   . Anxiety disorder Father   . Depression Father   . ADD / ADHD Father   . Depression Sister   . Bipolar disorder Sister   . ADD / ADHD Brother     Social History   Socioeconomic History  . Marital status: Divorced    Spouse name: Not on file  . Number of children: 0  . Years of education: Not on file  . Highest education level: Bachelor's degree (e.g., BA, AB, BS)  Occupational History  . Occupation: disability  Tobacco Use  . Smoking status: Never Smoker  . Smokeless tobacco: Never Used  Substance and Sexual Activity  . Alcohol use: No    Alcohol/week: 0.0 standard drinks  . Drug use: No  . Sexual activity: Yes    Birth control/protection: None  Other Topics Concern  . Not on file  Social History Narrative  . Not on file   Social Determinants of Health   Financial Resource Strain: High Risk  . Difficulty of Paying Living Expenses: Hard  Food Insecurity: Food Insecurity Present  . Worried About Charity fundraiser in the Last Year: Often true  . Ran Out of Food in the Last Year: Often true  Transportation Needs: No Transportation Needs  . Lack of  Transportation (Medical): No  . Lack of Transportation (Non-Medical): No  Physical Activity: Inactive  . Days of Exercise per Week: 0 days  . Minutes of Exercise per Session: 0 min  Stress: Stress Concern Present  . Feeling of Stress : Very much  Social Connections: Severely Isolated  . Frequency of Communication with Friends and Family: Once a week  . Frequency of Social Gatherings with Friends and Family: Once a week  . Attends Religious Services: Never  . Active Member of Clubs or Organizations: No  . Attends Archivist Meetings: Never  . Marital Status: Divorced  Human resources officer Violence: Not At Risk  . Fear of Current or Ex-Partner: No  . Emotionally Abused: No  . Physically Abused: No  . Sexually Abused: No  Review of Systems: See HPI, otherwise negative ROS  Physical Exam: BP (!) 154/100   Pulse 77   Temp (!) 96.9 F (36.1 C) (Temporal)   Resp 20   Ht '5\' 7"'  (1.702 m)   Wt 86.2 kg   SpO2 100%   BMI 29.76 kg/m  General:   Alert,  pleasant and cooperative in NAD Head:  Normocephalic and atraumatic. Neck:  Supple; no masses or thyromegaly. Lungs:  Clear throughout to auscultation.    Heart:  Regular rate and rhythm. Abdomen:  Soft, nontender and nondistended. Normal bowel sounds, without guarding, and without rebound.   Neurologic:  Alert and  oriented x4;  grossly normal neurologically.  Impression/Plan: Barbara Anderson is here for an colonoscopy to be performed for colon cancer screening  Risks, benefits, limitations, and alternatives regarding  colonoscopy have been reviewed with the patient.  Questions have been answered.  All parties agreeable.   Sherri Sear, MD  12/11/2019, 8:28 AM

## 2019-12-11 NOTE — Op Note (Addendum)
[facility] Gastroenterology Patient Name: Barbara Anderson Procedure Date: 12/11/2019 8:12 AM MRN: [id number] Account #: [id number] Date of Birth: April 25, 1971 Admit Type: Outpatient Age: 49 Room: [facility] ENDO ROOM 3 Gender: Female Note Status: Finalized Procedure:             Colonoscopy Indications:           Screening for colorectal malignant neoplasm, This is                         the patient's first colonoscopy Providers:             Lin Landsman MD, MD Referring MD:          Mar Daring (Referring MD) Medicines:             Monitored Anesthesia Care Complications:         No immediate complications. Estimated blood loss: None. Procedure:             Pre-Anesthesia Assessment:                        - Prior to the procedure, a History and Physical was                         performed, and patient medications and allergies were                         reviewed. The patient is competent. The risks and                         benefits of the procedure and the sedation options and                         risks were discussed with the patient. All questions                         were answered and informed consent was obtained.                         Patient identification and proposed procedure were                         verified by the physician, the nurse, the                         anesthesiologist, the anesthetist and the technician                         in the pre-procedure area in the procedure room in the                         endoscopy suite. Mental Status Examination: alert and                         oriented. Airway Examination: normal oropharyngeal                         airway and neck mobility. Respiratory Examination:  clear to auscultation. CV Examination: normal.                         Prophylactic Antibiotics: The patient does not require                         prophylactic antibiotics. Prior  Anticoagulants: The                         patient has taken no previous anticoagulant or                         antiplatelet agents. ASA Grade Assessment: III - A                         patient with severe systemic disease. After reviewing                         the risks and benefits, the patient was deemed in                         satisfactory condition to undergo the procedure. The                         anesthesia plan was to use monitored anesthesia care                         (MAC). Immediately prior to administration of                         medications, the patient was re-assessed for adequacy                         to receive sedatives. The heart rate, respiratory                         rate, oxygen saturations, blood pressure, adequacy of                         pulmonary ventilation, and response to care were                         monitored throughout the procedure. The physical                         status of the patient was re-assessed after the                         procedure.                        After obtaining informed consent, the colonoscope was                         passed under direct vision. Throughout the procedure,                         the patient's blood pressure, pulse, and oxygen  saturations were monitored continuously. The                         Colonoscope was introduced through the anus and                         advanced to the the cecum, identified by appendiceal                         orifice and ileocecal valve. The colonoscopy was                         performed with moderate difficulty due to poor bowel                         prep with stool present. Successful completion of the                         procedure was aided by applying abdominal pressure and                         lavage. The patient tolerated the procedure well. The                         quality of the bowel preparation was  poor. Findings:      The perianal and digital rectal examinations were normal. Pertinent       negatives include normal sphincter tone and no palpable rectal lesions.      Two sessile polyps were found in the transverse colon and ascending       colon. The polyps were 6 mm in size. These polyps were removed with a       cold snare. Resection and retrieval were complete.      Copious quantities of semi-liquid stool was found in the entire colon,       precluding visualization.      Non-bleeding external hemorrhoids were found during retroflexion. The       hemorrhoids were medium-sized. Impression:            - Preparation of the colon was poor.                        - Two 6 mm polyps in the transverse colon and in the                         ascending colon, removed with a cold snare. Resected                         and retrieved.                        - Stool in the entire examined colon.                        - Non-bleeding external hemorrhoids. Recommendation:        - Discharge patient to home (with escort).                        - Resume previous diet today.                        -  High fiber diet.                        - Continue present medications.                        - Await pathology results.                        - Repeat colonoscopy in 6 months with 2 day prep                         because the bowel preparation was poor. Procedure Code(s):     --- Professional ---                        [phone], Colonoscopy, flexible; with removal of                         tumor(s), polyp(s), or other lesion(s) by snare                         technique Diagnosis Code(s):     --- Professional ---                        Z12.11, Encounter for screening for malignant neoplasm                         of colon                        K64.4, Residual hemorrhoidal skin tags                        K63.5, Polyp of colon CPT copyright 2019 American Medical Association. All rights  reserved. The codes documented in this report are preliminary and upon coder review may  be revised to meet current compliance requirements. Dr. Ulyess Mort Lin Landsman MD, MD 12/11/2019 8:54:51 AM This report has been signed electronically. Number of Addenda: 0 Note Initiated On: 12/11/2019 8:12 AM Scope Withdrawal Time: 0 hours 11 minutes 42 seconds  Total Procedure Duration: 0 hours 16 minutes 0 seconds  Estimated Blood Loss:  Estimated blood loss: none.      [facility]

## 2019-12-11 NOTE — Telephone Encounter (Signed)
-----   Message from Lin Landsman, MD sent at 12/11/2019  9:04 AM EDT ----- Regarding: New pt Non urgent, appt for chronic constipation  RV

## 2019-12-11 NOTE — Anesthesia Procedure Notes (Signed)
Performed by: Cook-Martin, Seann Genther Pre-anesthesia Checklist: Patient identified, Emergency Drugs available, Suction available, Patient being monitored and Timeout performed Patient Re-evaluated:Patient Re-evaluated prior to induction Oxygen Delivery Method: Nasal cannula Preoxygenation: Pre-oxygenation with 100% oxygen Induction Type: IV induction Placement Confirmation: CO2 detector and positive ETCO2       

## 2019-12-11 NOTE — Anesthesia Preprocedure Evaluation (Signed)
Anesthesia Evaluation  Patient identified by MRN, date of birth, ID band Patient awake    Reviewed: Allergy & Precautions, H&P , NPO status , Patient's Chart, lab work & pertinent test results  History of Anesthesia Complications Negative for: history of anesthetic complications  Airway Mallampati: II  TM Distance: >3 FB    Comment: Large neck Dental  (+) Teeth Intact, Dental Advidsory Given   Pulmonary neg pulmonary ROS,  Reports frequent bouts of bronchitis, not currently sick          Cardiovascular Exercise Tolerance: Good negative cardio ROS       Neuro/Psych  Headaches, neg Seizures PSYCHIATRIC DISORDERS Anxiety Depression Bipolar Disorder H/o head trauma and short term memory loss    GI/Hepatic negative GI ROS, Neg liver ROS,   Endo/Other  negative endocrine ROS  Renal/GU negative Renal ROS     Musculoskeletal   Abdominal   Peds  Hematology negative hematology ROS (+)   Anesthesia Other Findings Past Medical History: No date: ADD (attention deficit disorder) No date: Anxiety No date: Back pain 2001: Concussion     Comment:  head trauma-work related. Caused headaches and short               term memory loss. No date: Depression No date: Diabetes mellitus without complication (HCC) No date: Overactive bladder No date: Recurrent vaginitis  Past Surgical History: No date: ankle pin and plates placement     Comment:  right 2005: COSMETIC SURGERY     Comment:  BREASTS, ARM, ABDOMEN AFTER WEIGHT LOSS  BMI    Body Mass Index:  45.26 kg/m      Reproductive/Obstetrics negative OB ROS                             Anesthesia Physical  Anesthesia Plan  ASA: III  Anesthesia Plan: General   Post-op Pain Management:    Induction: Intravenous  PONV Risk Score and Plan: Propofol infusion and TIVA  Airway Management Planned: Natural Airway and Nasal Cannula  Additional  Equipment:   Intra-op Plan:   Post-operative Plan:   Informed Consent: I have reviewed the patients History and Physical, chart, labs and discussed the procedure including the risks, benefits and alternatives for the proposed anesthesia with the patient or authorized representative who has indicated his/her understanding and acceptance.     Dental Advisory Given  Plan Discussed with: Anesthesiologist and CRNA  Anesthesia Plan Comments:         Anesthesia Quick Evaluation

## 2019-12-14 ENCOUNTER — Ambulatory Visit: Payer: Medicare HMO | Admitting: Pharmacist

## 2019-12-14 ENCOUNTER — Encounter: Payer: Self-pay | Admitting: Physician Assistant

## 2019-12-14 ENCOUNTER — Encounter: Payer: Self-pay | Admitting: Gastroenterology

## 2019-12-14 ENCOUNTER — Other Ambulatory Visit: Payer: Self-pay

## 2019-12-14 ENCOUNTER — Telehealth: Payer: Self-pay | Admitting: Physician Assistant

## 2019-12-14 DIAGNOSIS — G894 Chronic pain syndrome: Secondary | ICD-10-CM

## 2019-12-14 DIAGNOSIS — F315 Bipolar disorder, current episode depressed, severe, with psychotic features: Secondary | ICD-10-CM

## 2019-12-14 LAB — SURGICAL PATHOLOGY

## 2019-12-14 NOTE — Telephone Encounter (Signed)
Called [facility] and spoke with representative. She stated that a Caseworker from [facility] might be able to assist patient with getting ramp built on back porch. Care Guide was placed on hold, but caseworker did not come to the phone. Care Guide will give  [facility] a call back regarding assistance for Barbara Anderson.

## 2019-12-14 NOTE — Chronic Care Management (AMB) (Signed)
Chronic Care Management Pharmacy  Name: Disney Ruggiero  MRN: [id number] DOB: 10-09-1970  Chief Complaint/ HPI  Barbara Anderson,  49 y.o. , female presents for their Initial CCM visit with the clinical pharmacist via telephone due to COVID-19 Pandemic.  PCP : Mar Daring, PA-C  Their chronic conditions include: DM, HTN, bipolar  Office Visits: 3/8 HTN, [place], BP 122/86 P 78 Wt 240 BMI 37.7, metformin induced diarrhea  Consult Visit: 4/20 Colon cancer screen, [place], metformin induced diarrhea?, bipolar, CKD 3 12/9 GYN, [place], BP 124/80 Wt 268  Medications: Outpatient Encounter Medications as of 12/14/2019  Medication Sig  . Alcohol Swabs (B-D SINGLE USE SWABS REGULAR) PADS USE TO CLEANSE SKIN DAILY BEFORE CHECKING BLOOD SUGAR  . ALPRAZolam (XANAX) 1 MG tablet Take 1 mg by mouth 2 (two) times daily as needed.   Marland Kitchen amphetamine-dextroamphetamine (ADDERALL) 20 MG tablet Take 20 mg by mouth. Has not started yet 11/10/19  . atorvastatin (LIPITOR) 10 MG tablet Take 1 tablet (10 mg total) by mouth daily.  . DULoxetine (CYMBALTA) 60 MG capsule Take 2 capsules (120 mg total) by mouth daily.  . fluticasone (FLONASE) 50 MCG/ACT nasal spray USE 2 SPRAYS INTO BOTH NOSTRILS DAILY.  Marland Kitchen Lancets Misc. (ACCU-CHEK FASTCLIX LANCET) KIT To check blood sugar once daily  . levothyroxine (SYNTHROID) 25 MCG tablet TAKE 1 TABLET EVERY DAY BEFORE BREAKFAST  . pregabalin (LYRICA) 200 MG capsule Take 1 capsule (200 mg total) by mouth 2 (two) times daily.  . QUEtiapine Fumarate (SEROQUEL XR) 150 MG 24 hr tablet Take 150 mg by mouth at bedtime.   . topiramate (TOPAMAX) 200 MG tablet Take 1 tablet (200 mg total) by mouth at bedtime.  . Vitamin D, Ergocalciferol, (DRISDOL) 1.25 MG (50000 UT) CAPS capsule TAKE 1 CAPSULE EVERY 7 DAYS  . albuterol (VENTOLIN HFA) 108 (90 Base) MCG/ACT inhaler INHALE 2 PUFFS INTO THE LUNGS EVERY 4 HOURS AS NEEDED FOR WHEEZING OR SHORTNESS OF BREATH. (Patient not taking: Reported  on 12/14/2019)  . D-5000 125 MCG (5000 UT) TABS Take 1 tablet by mouth once a week.  . ferrous sulfate 325 (65 FE) MG tablet Take 325 mg by mouth daily with breakfast.  . folic acid (FOLVITE) 1 MG tablet Take 1 tablet (1 mg total) daily by mouth. (Patient not taking: Reported on 12/14/2019)  . furosemide (LASIX) 40 MG tablet Take 1 tablet (40 mg total) by mouth daily. (Patient not taking: Reported on 12/14/2019)  . hydrOXYzine (ATARAX/VISTARIL) 50 MG tablet Take 1 tablet (50 mg total) by mouth 3 (three) times daily as needed for anxiety. (Patient not taking: Reported on 12/14/2019)  . oxyCODONE (OXY IR/ROXICODONE) 5 MG immediate release tablet Take 1 tablet (5 mg total) by mouth every 6 (six) hours as needed for severe pain or breakthrough pain. (Patient not taking: Reported on 12/14/2019)  . risperiDONE (RISPERDAL) 1 MG tablet Take 1 tablet (1 mg total) by mouth at bedtime. (Patient not taking: Reported on 12/14/2019)  . traZODone (DESYREL) 150 MG tablet Take 1 tablet (150 mg total) by mouth at bedtime. (Patient not taking: Reported on 12/14/2019)   No facility-administered encounter medications on file as of 12/14/2019.     Stress: Stress Concern Present  . Feeling of Stress : Very much    Current Diagnosis/Assessment:  Goals Addressed            This Visit's Progress   . Bipolar - goal reduce racing thoughts at night        CARE  PLAN ENTRY (see longitudinal plan of care for additional care plan information)  Current Barriers:  . Polypharmacy; complex patient with multiple comorbidities including diabetes, hypothyroidism . Could not afford optimal medication Vraylar   Pharmacist Clinical Goal(s):  Marland Kitchen Over the next 90 days, patient will work with PharmD and provider towards optimized medication management  Interventions: . Comprehensive medication review performed; medication list updated in electronic medical record . Inter-disciplinary care team collaboration (see longitudinal plan  of care) . Corrections to medication list . Counseling patient on the role of each medication  Patient Self Care Activities:  . Patient will take medications as prescribed . Patient will focus on improved adherence by continuing current approach  Initial goal documentation     . Chronic Pain - goal simplify regimen        CARE PLAN ENTRY (see longitudinal plan of care for additional care plan information)  Current Barriers:  . Polypharmacy; complex patient with multiple comorbidities including bipolar, hypothyroidism . Multiple medications for the same indication   Pharmacist Clinical Goal(s):  Marland Kitchen Over the next 90 days, patient will work with PharmD and provider towards optimized medication management  Interventions: . Comprehensive medication review performed; medication list updated in electronic medical record . Inter-disciplinary care team collaboration (see longitudinal plan of care) . Contact pain management to verify Neurontin, Lyrica, Cymbalta, Topamax together  Patient Self Care Activities:  . Patient will take medications as prescribed . Patient will focus on improved adherence by reviewing pain management regimen with Dr. Toy Care  Initial goal documentation     . Diabetes Type 2 - goal lifestyle control       CARE PLAN ENTRY (see longitudinal plan of care for additional care plan information)  Current Barriers:  . Diabetes: type 2; complicated by chronic medical conditions including hypertension, chronic pain .  Lab Results  Component Value Date   HGBA1C 6.0 (H) 10/05/2019 .   Lab Results  Component Value Date   CREATININE 1.04 (H) 10/05/2019   CREATININE 1.36 (H) 01/07/2019   CREATININE 1.35 (H) 01/05/2019   . Kidney Function . Lab Results .  Component . Value . Date/Time .   Marland Kitchen CREATININE . 1.04 (H) . 10/05/2019 03:28 PM .   . CREATININE . 1.36 (H) . 01/07/2019 04:13 AM .   . CREATININE . 0.88 . 05/12/2014 07:47 PM .   . CREATININE . 0.79 . 04/22/2014  03:03 PM .   . CREATININE . 1.03 . 10/21/2013 08:28 PM .   . GFRNONAA . 63 . 10/05/2019 03:28 PM .   . GFRNONAA . >60 . 10/21/2013 08:28 PM .   . GFRAA . 72 . 10/05/2019 03:28 PM .   . GFRAA . >60 . 10/21/2013 08:28 PM .    . Current antihyperglycemic regimen: None . Denies hypoglycemic symptoms, including dizziness, lightheadedness, shaking, sweating . Denies hyperglycemic symptoms, including polyuria, polydipsia, polyphagia, nocturia, blurred vision, neuropathy . Current exercise: not discussed  Pharmacist Clinical Goal(s):  Marland Kitchen Over the next 90 days, patient will work with PharmD and primary care provider to address lifestyle only diabetes management  Interventions: . Comprehensive medication review performed, medication list updated in electronic medical record . Inter-disciplinary care team collaboration (see longitudinal plan of care)  Patient Self Care Activities:  . Patient will check blood glucose daily , document, and provide at future appointments to ensure not medications are necessary . Patient will contact provider with any episodes of hypoglycemia . Patient will report any questions or concerns  to provider   Initial goal documentation     . Hyperlipidemia - goal LDL < 100       CARE PLAN ENTRY (see longitudinal plan of care for additional care plan information)  Current Barriers:  . Uncontrolled hyperlipidemia, complicated by diabetes, hypertension, chronic pain . Current antihyperlipidemic regimen: Lipitor '10mg'$  daily . Previous antihyperlipidemic medications tried NA . Most recent lipid panel:     Component Value Date/Time   CHOL 202 (H) 04/11/2017 0702   TRIG 149 04/11/2017 0702   HDL 33 (L) 04/11/2017 0702   CHOLHDL 6.1 04/11/2017 0702   VLDL 30 04/11/2017 0702   LDLCALC 139 (H) 04/11/2017 0702 .    Pharmacist Clinical Goal(s):  Marland Kitchen Over the next 30 days, patient will work with PharmD and providers towards optimized antihyperlipidemic  therapy  Interventions: . Comprehensive medication review performed; medication list updated in electronic medical record.  Bertram Savin care team collaboration (see longitudinal plan of care) . Increase atorvastatin '20mg'$  1 tab by mouth daily  Patient Self Care Activities:  . Patient will focus on medication adherence by filling and taking new  strength of cholesterol medication  Initial goal documentation        Diabetes   Recent Relevant Labs: Lab Results  Component Value Date/Time   HGBA1C 6.0 (H) 10/05/2019 03:28 PM   HGBA1C 7.0 (H) 01/06/2019 03:50 PM   MICROALBUR 50 07/21/2018 04:34 PM     Checking BG: Rarely  Recent FBG Readings: NA Patient is currently controlled on the following medications: None  Last diabetic Foot exam: No results found for: HMDIABEYEEXA  Last diabetic Eye exam: No results found for: HMDIABFOOTEX   We discussed:  Vitamin D2 50000 week for years 6/20 level 40.1 Well controlled DM  Plan D/c Vitamin D2 Start Vitamin D3 1000 iu 1 cap by mouth daily Remove Vitamin D3 5000 iu daily from medication list Continue current medications  Bipolar   Patient has failed these meds in past: Risperdal, trazodone, Atarax Patient is currently controlled on the following medications: Abilify, Seroquel  We discussed:  Vraylar? Could not afford. Cymbalta '120mg'$  qam Folic acid 161WRU daily (Pt believes for yeast) Adderall '20mg'$  tid Abilify '5mg'$  daily x 10 days, takes at bedtime (racing thoughts) Xanax '1mg'$  bid routinely Not taking Risperdal Not taking trazodone Not taking Atarax  Plan  Remove Vralylar from medication list Remove risperidone from medication list Correct Adderall in list Correct Folic acid in list Remove hydroxyzine from the medication list Continue current medications  Hyperlipidemia   Lipid Panel     Component Value Date/Time   CHOL 202 (H) 04/11/2017 0702   TRIG 149 04/11/2017 0702   HDL 33 (L) 04/11/2017 0702    CHOLHDL 6.1 04/11/2017 0702   VLDL 30 04/11/2017 0702   LDLCALC 139 (H) 04/11/2017 0702     The 10-year ASCVD risk score Mikey Bussing DC Jr., et al., 2013) is: 4.2%   Values used to calculate the score:     Age: 70 years     Sex: Female     Is Non-Hispanic African American: No     Diabetic: Yes     Tobacco smoker: No     Systolic Blood Pressure: 045 mmHg     Is BP treated: Yes     HDL Cholesterol: 33 mg/dL     Total Cholesterol: 202 mg/dL   Patient has failed these meds in past: NA Patient is currently uncontrolled on the following medications: Lipitor '10mg'$   We discussed:  No issues with Lipitor '10mg'$  daily  Plan  Increase atorvastatin '20mg'$  1 tab by mouth daily  Hypothyroidism   TSH  Date Value Ref Range Status  01/01/2019 1.840 0.450 - 4.500 uIU/mL Final     Patient has failed these meds in past: NA Patient is currently controlled on the following medications: Synthroid  We discussed:  Last TSH 6/20  Plan  Consider new TSH level Continue current medications   Neuropathic Pain   Patient has failed these meds in past: oxycodone Patient is currently controlled on the following medications: Neurontin, Lyrica, Topamax  We discussed:   Topamax '200mg'$  in the middle of the day mood stabilizer Gabapentin '300mg'$  twice daily Toy Care Lyrica '200mg'$  twice daily Not taking oxycodone  Plan  Ask Toy Care about Neurontin and Lyrica Remove oxycodone from medication list Continue current medications   Medication Management   Pt uses [facility] for all medications Uses pill box? Yes Pt endorses 100% compliance  We discussed:  Vitamin C '500mg'$  once daily boost immune system  Not taking Lasix but still has lymphedema compression stockings and pump, has to stay off feet Not taking iron Not using Ventolin at all  Plan  Remove ferrous sulfate from medication list Remove albuterol rescue inhaler from medication list Remove furosemide from the medication list   Continue  current medication management strategy  Follow up: 1 month phone visit   Milus Height, PharmD, [place], [place] [phone]

## 2019-12-14 NOTE — Telephone Encounter (Signed)
   SF 12/14/2019   Name: Barbara Anderson   MRN: [id number]   DOB: August 20, 1970   AGE: 49 y.o.   GENDER: female   PCP Mar Daring, PA-C.   Called pt regarding Community Resource Referral for assistance with food resources and ramp for back porch. Informed [name] that there are some food banks within [facility] that may be able to assist her. Patient asked that the list of food banks be mailed out to her. Care Guide will mail the list to [name] today. Also, informed patient that [facility] maybe able to help her with getting the ramp placed on her back porch. Care Guide will give patient a call back with more information regarding [facility] and also let patient know if she has any additional needs to give the office a call.    [place], Care Management Phone: [phone] Email: [email or web address]

## 2019-12-14 NOTE — Telephone Encounter (Signed)
Made appointment for patient

## 2019-12-15 NOTE — Patient Instructions (Addendum)
Visit Information  Goals Addressed            This Visit's Progress   . Bipolar - goal reduce racing thoughts at night        [place] (see longitudinal plan of care for additional care plan information)  Current Barriers:  . Polypharmacy; complex patient with multiple comorbidities including diabetes, hypothyroidism . Could not afford optimal medication [place]   Pharmacist Clinical Goal(s):  Marland Kitchen Over the next 90 days, patient will work with PharmD and provider towards optimized medication management  Interventions: . Comprehensive medication review performed; medication list updated in electronic medical record . Inter-disciplinary care team collaboration (see longitudinal plan of care) . Corrections to medication list . Counseling patient on the role of each medication  Patient Self Care Activities:  . Patient will take medications as prescribed . Patient will focus on improved adherence by continuing current approach  Initial goal documentation     . Chronic Pain - goal simplify regimen        CARE PLAN ENTRY (see longitudinal plan of care for additional care plan information)  Current Barriers:  . Polypharmacy; complex patient with multiple comorbidities including bipolar, hypothyroidism . Multiple medications for the same indication   Pharmacist Clinical Goal(s):  Marland Kitchen Over the next 90 days, patient will work with PharmD and provider towards optimized medication management  Interventions: . Comprehensive medication review performed; medication list updated in electronic medical record . Inter-disciplinary care team collaboration (see longitudinal plan of care) . Contact pain management to verify Neurontin, Lyrica, Cymbalta, Topamax together  Patient Self Care Activities:  . Patient will take medications as prescribed . Patient will focus on improved adherence by reviewing pain management regimen with Dr. Toy Care  Initial goal documentation     . Diabetes  Type 2 - goal lifestyle control       CARE PLAN ENTRY (see longitudinal plan of care for additional care plan information)  Current Barriers:  . Diabetes: type 2; complicated by chronic medical conditions including hypertension, chronic pain .  Lab Results  Component Value Date   HGBA1C 6.0 (H) 10/05/2019 .   Lab Results  Component Value Date   CREATININE 1.04 (H) 10/05/2019   CREATININE 1.36 (H) 01/07/2019   CREATININE 1.35 (H) 01/05/2019   . Kidney Function . Lab Results .  Component . Value . Date/Time .   Marland Kitchen CREATININE . 1.04 (H) . 10/05/2019 03:28 PM .   . CREATININE . 1.36 (H) . 01/07/2019 04:13 AM .   . CREATININE . 0.88 . 05/12/2014 07:47 PM .   . CREATININE . 0.79 . 04/22/2014 03:03 PM .   . CREATININE . 1.03 . 10/21/2013 08:28 PM .   . GFRNONAA . 63 . 10/05/2019 03:28 PM .   . GFRNONAA . >60 . 10/21/2013 08:28 PM .   . GFRAA . 72 . 10/05/2019 03:28 PM .   . GFRAA . >60 . 10/21/2013 08:28 PM .    . Current antihyperglycemic regimen: None . Denies hypoglycemic symptoms, including dizziness, lightheadedness, shaking, sweating . Denies hyperglycemic symptoms, including polyuria, polydipsia, polyphagia, nocturia, blurred vision, neuropathy . Current exercise: not discussed  Pharmacist Clinical Goal(s):  Marland Kitchen Over the next 90 days, patient will work with PharmD and primary care provider to address lifestyle only diabetes management  Interventions: . Comprehensive medication review performed, medication list updated in electronic medical record . Inter-disciplinary care team collaboration (see longitudinal plan of care)  Patient Self Care Activities:  .  Patient will check blood glucose daily , document, and provide at future appointments to ensure not medications are necessary . Patient will contact provider with any episodes of hypoglycemia . Patient will report any questions or concerns to provider   Initial goal documentation     . Hyperlipidemia - goal LDL < 100        CARE PLAN ENTRY (see longitudinal plan of care for additional care plan information)  Current Barriers:  . Uncontrolled hyperlipidemia, complicated by diabetes, hypertension, chronic pain . Current antihyperlipidemic regimen: Lipitor 10mg  daily . Previous antihyperlipidemic medications tried NA . Most recent lipid panel:     Component Value Date/Time   CHOL 202 (H) 04/11/2017 0702   TRIG 149 04/11/2017 0702   HDL 33 (L) 04/11/2017 0702   CHOLHDL 6.1 04/11/2017 0702   VLDL 30 04/11/2017 0702   LDLCALC 139 (H) 04/11/2017 0702 .    Pharmacist Clinical Goal(s):  Marland Kitchen Over the next 30 days, patient will work with PharmD and providers towards optimized antihyperlipidemic therapy  Interventions: . Comprehensive medication review performed; medication list updated in electronic medical record.  Bertram Savin care team collaboration (see longitudinal plan of care) . Increase atorvastatin 20mg  1 tab by mouth daily  Patient Self Care Activities:  . Patient will focus on medication adherence by filling and taking new  strength of cholesterol medication  Initial goal documentation        Ms. Stronach was given information about Chronic Care Management services today including:  1. CCM service includes personalized support from designated clinical staff supervised by her physician, including individualized plan of care and coordination with other care providers 2. 24/7 contact phone numbers for assistance for urgent and routine care needs. 3. Standard insurance, coinsurance, copays and deductibles apply for chronic care management only during months in which we provide at least 20 minutes of these services. Most insurances cover these services at 100%, however patients may be responsible for any copay, coinsurance and/or deductible if applicable. This service may help you avoid the need for more expensive face-to-face services. 4. Only one practitioner may furnish and bill the service in  a calendar month. 5. The patient may stop CCM services at any time (effective at the end of the month) by phone call to the office staff.  Patient agreed to services and verbal consent obtained.   Print copy of patient instructions provided.  Telephone follow up appointment with pharmacy team member scheduled for: 1 month  Milus Height, PharmD, [place], [place] [phone]   What You Need to Know About Chronic Back Pain Long-term (chronic) back pain is back pain that lasts for 12 weeks or longer. It often affects the lower back and can range from mild to severe. Many people have back pain at some point in their lives. It can feel different to each person. It may feel like a muscle ache or a sharp, stabbing pain. The pain often gets worse over time. It can be difficult to find the cause of chronic back pain. Treating chronic back pain often starts with rest and pain relief, followed by exercises (physical therapy) to strengthen the muscles that support your back. You may have to try different things to see what works best for you. If other treatments do not help, or if your pain is caused by a condition or an injury, you may need surgery. How can back pain affect me? Chronic back pain is uncomfortable and can make it hard to  do your usual daily activities. Chronic back pain can:  Cause numbness and tingling.  Come and go.  Get worse when you are sitting, standing, walking, bending, or lifting.  Affect you while you are active, at rest, or both.  Eventually make it hard to move around.  Occur with fever, weight loss, or difficulty urinating. What are the benefits of treating back pain? Treating chronic back pain may:  Relieve pain.  Keep your pain from getting worse.  Make it easier for you to do your usual activities. What are some steps I can take to decrease my back pain?   Take over-the-counter or prescription medicines only as told  by your health care provider.  If directed, apply heat to the affected area. Use the heat source that your health care provider recommends, such as a moist heat pack or a heating pad. ? Place a towel between your skin and the heat source. ? Leave the heat on for 20-30 minutes. ? Remove the heat if your skin turns bright red. This is especially important if you are unable to feel pain, heat, or cold. You may have a greater risk of getting burned.  If directed, put ice on the affected area: ? Put ice in a plastic bag. ? Place a towel between your skin and the bag. ? Leave the ice on for 20 minutes, 2-3 times a day.  Get regular exercise as told by your health care provider to improve flexibility and strength.  Do not smoke.  Maintain a healthy weight.  When lifting objects: ? Keep your feet as far apart as your shoulders (shoulder-width apart) or farther apart. ? Tighten the muscles in your abdomen. ? Bend your knees and hips and keep your spine neutral. It is important to lift using the strength of your legs, not your back. Do not lock your knees straight out. ? Always ask for help to lift heavy or awkward objects. What can happen if my back pain goes untreated? Untreated back pain can:  Get worse over time.  Start to occur more often or at different times, such as when you are resting.  Cause posture problems.  Make it hard to move around (limit mobility). Where can I get support? Chronic back pain can be a frustrating condition to manage. It may help to talk with other people who are having a similar experience. Consider joining a support group for people dealing with chronic back pain. Ask your health care provider about support groups in your area. You can also find online and in-person support groups through:  The American Chronic Pain Association: DeluxeOption.si  The U.S. Pain Foundation: uspainfoundation.org/support-groups Contact a health care  provider if:  Your symptoms do not get better or they get worse.  You have severe back pain.  You have chronic back pain and a fever.  You lose weight without trying.  You have difficulty urinating.  You experience numbness or tingling.  You develop new pain after an injury. Summary  Chronic back pain is often treated with rest, pain relief, and physical therapy.  Get regular exercise to improve your strength and flexibility.  Put heat and ice on the affected areas as directed by your health care provider.  Chronic back pain can be challenging to live with. Joining a support group may help you manage your condition. This information is not intended to replace advice given to you by your health care provider. Make sure you discuss any questions you have with your  health care provider. Document Revised: 06/28/2017 Document Reviewed: 03/24/2016 Elsevier Patient Education  2020 [employer].

## 2019-12-16 NOTE — Telephone Encounter (Signed)
Care Guide sent e-mail to [facility], NHA in the office, to print and mail letter for Ms. Robbins.    From: Audelia Hives [email or web address]> To:     Markoski, [email or web address]>  Subject:   Secure: Letter for [employer],   When you get a chance can you please print and mail the letter attached for Ms. [name]?  Thanks,    [place], Care Management Phone: [phone] Email: [email or web address]

## 2019-12-17 NOTE — Telephone Encounter (Signed)
Spoke with Hollice Gong at [facility]. She completed a referral for patient over the phone. She stated that it will take couple of weeks for her office to get in contact with the patient and receive the rest of the information needed to complete the referral.

## 2019-12-18 DIAGNOSIS — M47816 Spondylosis without myelopathy or radiculopathy, lumbar region: Secondary | ICD-10-CM | POA: Diagnosis not present

## 2019-12-18 DIAGNOSIS — M4727 Other spondylosis with radiculopathy, lumbosacral region: Secondary | ICD-10-CM | POA: Diagnosis not present

## 2019-12-18 DIAGNOSIS — M4726 Other spondylosis with radiculopathy, lumbar region: Secondary | ICD-10-CM | POA: Diagnosis not present

## 2019-12-23 ENCOUNTER — Other Ambulatory Visit: Payer: Self-pay | Admitting: Physician Assistant

## 2019-12-23 DIAGNOSIS — E782 Mixed hyperlipidemia: Secondary | ICD-10-CM

## 2019-12-23 MED ORDER — ATORVASTATIN CALCIUM 20 MG PO TABS: 20.0000 mg | ORAL_TABLET | Freq: Every day | ORAL | 3 refills | Status: DC

## 2019-12-23 NOTE — Progress Notes (Signed)
Medication list updated.

## 2020-01-04 NOTE — Telephone Encounter (Signed)
Sent e-mail to Hollice Gong, Administrative Specialist, to get the status of [name] referral to [employer]. When referral was placed on 12/17/19 Sharyn Lull stated that it will take up to 2 weeks before her office would be able to contact the patient.  Audelia Hives [email or web address]> [email or web address] Secure: Vocational Rehabilitation Referral  Good Morning Sharyn Lull,   I just wanted to touch base with you regarding the referral for Barbara Anderson, DOB [birth year].    Thanks,    [place], Care Management Phone: [phone] Email: [email or web address]

## 2020-01-05 NOTE — Telephone Encounter (Signed)
Received e-mail from Hollice Gong regarding the referral for Barbara Anderson. She stated that her office will be contacting Barbara Anderson this week and she will send Care Guide a message once the patient has been contacted.    Hello Sheneka, I checked our referral list and she is in que for an intake call this week. I will confirm with you when we have been in contact with the client. Thanks again for the referral!  Gordy Councilman [employer]. Division of Vocational Rehabilitation [place] [facility] and [employer]  [phone]    Office  [phone]    Direct [phone]    Fax [email or web address]  [place], [place] [address]

## 2020-01-07 NOTE — Telephone Encounter (Signed)
Received e-mail from Hollice Gong from [facility]. She stated that she has contacted that patient to complete the referral process.   From: Gordy Councilman [email or web address]>  Sent: Thursday, January 07, 2020 11:50 AM To: Audelia Hives [email or web address]> Subject: Referal  This message was sent securely using Zix    Barbara Anderson,  I spoke with [name] today and her application packet will be mailed to her for signature. Thanks and have a great day!  Gordy Councilman [employer]. Division of Vocational Rehabilitation [place] [facility] and [employer]  [phone]    Office  [phone]    Direct [phone]    Fax [email or web address]  [place], [place] [address]

## 2020-01-07 NOTE — Telephone Encounter (Signed)
   SF 01/07/2020   Name: Barbara Anderson   MRN: [id number]   DOB: 11-24-1970   AGE: 49 y.o.   GENDER: female   PCP Mar Daring, PA-C.   Spoke with patient today regarding referral food resources and ramp. Patient stated that she has spoken with [facility]. Previously letter was mailed to patient with list of food pantries in [place].  Asked patient if she has any additional needs at this time. Patient stated she has no additional needs at this time.   Closing referral pending any other needs of patient.    [place], Care Management Phone: [phone] Email: [email or web address]

## 2020-01-08 ENCOUNTER — Other Ambulatory Visit: Payer: Self-pay | Admitting: Physician Assistant

## 2020-01-12 ENCOUNTER — Other Ambulatory Visit: Payer: Self-pay

## 2020-01-13 ENCOUNTER — Telehealth: Payer: Self-pay

## 2020-01-13 ENCOUNTER — Other Ambulatory Visit: Payer: Self-pay

## 2020-01-13 ENCOUNTER — Ambulatory Visit (INDEPENDENT_AMBULATORY_CARE_PROVIDER_SITE_OTHER): Payer: Medicare HMO | Admitting: Gastroenterology

## 2020-01-13 ENCOUNTER — Ambulatory Visit
Admission: RE | Admit: 2020-01-13 | Discharge: 2020-01-13 | Disposition: A | Discharge status: Home / Self Care | Payer: Medicare HMO | Source: Ambulatory Visit | Attending: Gastroenterology | Admitting: Gastroenterology

## 2020-01-13 ENCOUNTER — Encounter: Payer: Self-pay | Admitting: Gastroenterology

## 2020-01-13 VITALS — BP 137/81 | HR 112 | Temp 98.3°F | Ht 67.0 in | Wt 253.2 lb

## 2020-01-13 DIAGNOSIS — R1032 Left lower quadrant pain: Secondary | ICD-10-CM | POA: Diagnosis not present

## 2020-01-13 DIAGNOSIS — K5732 Diverticulitis of large intestine without perforation or abscess without bleeding: Secondary | ICD-10-CM

## 2020-01-13 DIAGNOSIS — R110 Nausea: Secondary | ICD-10-CM | POA: Diagnosis not present

## 2020-01-13 LAB — POCT I-STAT CREATININE: Creatinine, Ser: 1 mg/dL (ref 0.44–1.00)

## 2020-01-13 MED ORDER — CIPROFLOXACIN HCL 500 MG PO TABS
500.0000 mg | ORAL_TABLET | Freq: Two times a day (BID) | ORAL | 0 refills | Status: AC
Start: 2020-01-13 — End: 2020-01-27

## 2020-01-13 MED ORDER — METRONIDAZOLE 500 MG PO TABS
500.0000 mg | ORAL_TABLET | Freq: Two times a day (BID) | ORAL | 0 refills | Status: AC
Start: 2020-01-13 — End: 2020-01-27

## 2020-01-13 MED ORDER — IOHEXOL 300 MG/ML  SOLN
100.0000 mL | Freq: Once | INTRAMUSCULAR | Status: AC | PRN
  Administered 2020-01-13: 100 mL via INTRAVENOUS

## 2020-01-13 MED ORDER — MAGNESIUM CITRATE PO SOLN: 1.0000 | Freq: Once | ORAL | 0 refills | Status: AC

## 2020-01-13 NOTE — Progress Notes (Signed)
Cephas Darby, MD [address]  [place]  [place], [place] [address]  Main: [phone]  Fax: [phone]    Gastroenterology Consultation  Referring Provider:     Florian Buff* Primary Care Physician:  Mar Daring, PA-C Primary Gastroenterologist:  Dr. Cephas Darby Reason for Consultation:     Left lower quadrant pain        HPI:   Barbara Anderson is a 49 y.o. female referred by Dr. Marlyn Corporal, Clearnce Sorrel, PA-C  for consultation & management of left lower quadrant pain.  Patient reports that for the past 1 and half weeks, she noticed acute onset of left lower quadrant pain associated with nausea and 2 loose bowel movements, pinkish mucus discharge.  Patient reports that she had severe constipation prior to the onset of left lower quadrant pain.  She denies fever, chills.  She denies prior episodes in the past.  She does not smoke or drink alcohol S/p cholecystectomy  NSAIDs: None  Antiplts/Anticoagulants/Anti thrombotics: None  GI Procedures: Colonoscopy 12/11/2019 - Preparation of the colon was poor. - Two 6 mm polyps in the transverse colon and in the ascending colon, removed with a cold snare. Resected and retrieved. - Stool in the entire examined colon. - Non-bleeding external hemorrhoids.  DIAGNOSIS:  A. COLON POLYP, ASCENDING; COLD SNARE:  - FRAGMENTS OF TUBULOVILLOUS ADENOMA.  - NEGATIVE FOR HIGH-GRADE DYSPLASIA AND MALIGNANCY.   B. COLON POLYP, TRANSVERSE; COLD SNARE:  - FRAGMENTS OF TUBULAR ADENOMA.  - NEGATIVE FOR HIGH-GRADE DYSPLASIA AND MALIGNANCY.    Past Medical History:  Diagnosis Date  . ADD (attention deficit disorder)   . Anxiety   . Back pain   . Cholecystitis, acute 01/06/2019  . Concussion 2001   head trauma-work related. Caused headaches and short term memory loss.  . Depression   . Diabetes mellitus without complication ([place])   . Overactive bladder   . Recurrent vaginitis     Past Surgical History:  Procedure  Laterality Date  . ankle pin and plates placement     right  . CHOLECYSTECTOMY N/A 01/06/2019   Procedure: LAPAROSCOPIC CHOLECYSTECTOMY;  Surgeon: Vickie Epley, MD;  Location: [facility] ORS;  Service: General;  Laterality: N/A;  . COLONOSCOPY WITH PROPOFOL N/A 12/11/2019   Procedure: COLONOSCOPY WITH PROPOFOL;  Surgeon: Lin Landsman, MD;  Location: [facility] ENDOSCOPY;  Service: Gastroenterology;  Laterality: N/A;  . COSMETIC SURGERY  2005   BREASTS, ARM, ABDOMEN AFTER WEIGHT LOSS    Current Outpatient Medications:  .  Alcohol Swabs (B-D SINGLE USE SWABS REGULAR) PADS, USE TO CLEANSE SKIN DAILY BEFORE CHECKING BLOOD SUGAR, Disp: 100 each, Rfl: 1 .  ALPRAZolam (XANAX) 1 MG tablet, Take 1 mg by mouth 2 (two) times daily as needed. , Disp: , Rfl:  .  amphetamine-dextroamphetamine (ADDERALL) 20 MG tablet, Take 1 tablet (20 mg total) by mouth in the morning, at noon, and at bedtime. Has not started yet 11/10/19, Disp: , Rfl:  .  ARIPiprazole (ABILIFY) 5 MG tablet, Take 5 mg by mouth daily., Disp: , Rfl:  .  atorvastatin (LIPITOR) 20 MG tablet, Take 1 tablet (20 mg total) by mouth daily., Disp: 90 tablet, Rfl: 3 .  Cholecalciferol (VITAMIN D3) 25 MCG (1000 UT) CAPS, Take 1 capsule (1,000 Units total) by mouth daily., Disp: 60 capsule, Rfl:  .  DULoxetine (CYMBALTA) 60 MG capsule, Take 2 capsules (120 mg total) by mouth daily., Disp: 60 capsule, Rfl: 0 .  fluticasone (FLONASE) 50  MCG/ACT nasal spray, USE 2 SPRAYS INTO BOTH NOSTRILS DAILY., Disp: 48 g, Rfl: 1 .  folic acid (QC FOLIC ACID) 893 MCG tablet, Take 1 tablet (800 mcg total) by mouth daily., Disp: , Rfl:  .  Lancets Misc. (ACCU-CHEK FASTCLIX LANCET) KIT, To check blood sugar once daily, Disp: 1 kit, Rfl: 0 .  levothyroxine (SYNTHROID) 25 MCG tablet, TAKE 1 TABLET EVERY DAY BEFORE BREAKFAST, Disp: 90 tablet, Rfl: 0 .  pregabalin (LYRICA) 200 MG capsule, Take 1 capsule (200 mg total) by mouth 2 (two) times daily., Disp: 180 capsule, Rfl: 1 .   QUEtiapine Fumarate (SEROQUEL XR) 150 MG 24 hr tablet, Take 150 mg by mouth at bedtime. , Disp: , Rfl:  .  ciprofloxacin (CIPRO) 500 MG tablet, Take 1 tablet (500 mg total) by mouth 2 (two) times daily for 14 days., Disp: 28 tablet, Rfl: 0 .  magnesium citrate SOLN, Take 296 mLs (1 Bottle total) by mouth once for 1 dose., Disp: 195 mL, Rfl: 0 .  metroNIDAZOLE (FLAGYL) 500 MG tablet, Take 1 tablet (500 mg total) by mouth 2 (two) times daily for 14 days., Disp: 28 tablet, Rfl: 0 .  topiramate (TOPAMAX) 200 MG tablet, Take 1 tablet (200 mg total) by mouth at bedtime. (Patient not taking: Reported on 01/13/2020), Disp: 30 tablet, Rfl: 1 .  traZODone (DESYREL) 150 MG tablet, Take 1 tablet (150 mg total) by mouth at bedtime. (Patient not taking: Reported on 01/13/2020), Disp: 30 tablet, Rfl: 1   Family History  Problem Relation Age of Onset  . Diabetes Mother   . Hypertension Mother   . Depression Mother   . Diabetes Father   . Hypertension Father   . Anxiety disorder Father   . Depression Father   . ADD / ADHD Father   . Depression Sister   . Bipolar disorder Sister   . ADD / ADHD Brother      Social History   Tobacco Use  . Smoking status: Never Smoker  . Smokeless tobacco: Never Used  Vaping Use  . Vaping Use: Never used  Substance Use Topics  . Alcohol use: No    Alcohol/week: 0.0 standard drinks  . Drug use: No    Allergies as of 01/13/2020 - Review Complete 01/13/2020  Allergen Reaction Noted  . Metformin and related Diarrhea 10/05/2019    Review of Systems:    All systems reviewed and negative except where noted in HPI.   Physical Exam:  BP 137/81 (BP Location: Left Arm, Patient Position: Sitting, Cuff Size: Large)   Pulse (!) 112   Temp 98.3 F (36.8 C) (Oral)   Ht _0  (1.702 m)   Wt 253 lb 4 oz (114.9 kg)   LMP 01/04/2020   BMI 39.66 kg/m  Patient's last menstrual period was 01/04/2020.  General:   Alert,  Well-developed, well-nourished, pleasant and  cooperative in NAD Head:  Normocephalic and atraumatic. Eyes:  Sclera clear, no icterus.   Conjunctiva pink. Ears:  Normal auditory acuity. Nose:  No deformity, discharge, or lesions. Mouth:  No deformity or lesions,oropharynx pink & moist. Neck:  Supple; no masses or thyromegaly. Lungs:  Respirations even and unlabored.  Clear throughout to auscultation.   No wheezes, crackles, or rhonchi. No acute distress. Heart:  Regular rate and rhythm; no murmurs, clicks, rubs, or gallops. Abdomen:  Normal bowel sounds. Soft, mild left lower quadrant tenderness and non-distended without masses, hepatosplenomegaly or hernias noted.  No guarding or rebound tenderness.   Rectal:  Not performed Msk:  Symmetrical without gross deformities. Good, equal movement & strength bilaterally. Pulses:  Normal pulses noted. Extremities:  No clubbing or edema.  No cyanosis. Neurologic:  Alert and oriented x3;  grossly normal neurologically. Skin:  Intact without significant lesions or rashes. No jaundice. Psych:  Alert and cooperative. Normal mood and affect.  Imaging Studies: No abdominal imaging  Assessment and Plan:   Barbara Anderson is a 49 y.o. African-American female with obesity, BMI 39.6, diabetes, history of cholecystectomy in 2020 is seen in consultation acute onset of 1 and half weeks history of left lower quadrant pain associated with loose stools mixed with pinkish mucus, nausea.  Her symptoms are concerning for acute left-sided diverticulitis.  Recommend CBC with differential Recommend stat CT abdomen pelvis with contrast after checking kidney function Empiric trial of 2 weeks course of ciprofloxacin and metronidazole   Follow up if symptoms not resolved   Cephas Darby, MD

## 2020-01-13 NOTE — Telephone Encounter (Signed)
Got authorization number for patient CT scan which is [id number]. [facility] and [facility] and [facility] will [facility] her [facility]. They also needed order for a STAT CMP. Order this. Called and informed patient and she is [facility] way [facility] mall now

## 2020-01-13 NOTE — Telephone Encounter (Signed)
Patient verbalized understanding of results. Patient will go pick up medications and magnesium citrate

## 2020-01-13 NOTE — Telephone Encounter (Signed)
-----   Message from Lin Landsman, MD sent at 01/13/2020  3:31 PM EDT ----- CT did not confirm diverticulitis.  However, given her clinical presentation, recommend 2 weeks course of ciprofloxacin and metronidazole 500 mg twice daily.  Strongly advise to avoid alcohol while taking antibiotics  CT revealed significant stool burden, therefore recommend a bottle of magnesium citrate to be taken by mouth today  RV

## 2020-01-13 NOTE — Telephone Encounter (Signed)
Called and left a message for call back. Sent medication to [facility]

## 2020-01-14 ENCOUNTER — Other Ambulatory Visit: Payer: Self-pay | Admitting: Physician Assistant

## 2020-01-14 DIAGNOSIS — E034 Atrophy of thyroid (acquired): Secondary | ICD-10-CM

## 2020-01-14 LAB — CBC WITH DIFFERENTIAL/PLATELET
Basophils Absolute: 0.1 10*3/uL (ref 0.0–0.2)
Basos: 1 %
EOS (ABSOLUTE): 0.2 10*3/uL (ref 0.0–0.4)
Eos: 2 %
Hematocrit: 36.4 % (ref 34.0–46.6)
Hemoglobin: 11.9 g/dL (ref 11.1–15.9)
Immature Grans (Abs): 0 10*3/uL (ref 0.0–0.1)
Immature Granulocytes: 0 %
Lymphocytes Absolute: 2.3 10*3/uL (ref 0.7–3.1)
Lymphs: 29 %
MCH: 29.7 pg (ref 26.6–33.0)
MCHC: 32.7 g/dL (ref 31.5–35.7)
MCV: 91 fL (ref 79–97)
Monocytes Absolute: 0.4 10*3/uL (ref 0.1–0.9)
Monocytes: 5 %
Neutrophils Absolute: 4.9 10*3/uL (ref 1.4–7.0)
Neutrophils: 63 %
Platelets: 248 10*3/uL (ref 150–450)
RBC: 4.01 x10E6/uL (ref 3.77–5.28)
RDW: 14 % (ref 11.7–15.4)
WBC: 7.8 10*3/uL (ref 3.4–10.8)

## 2020-01-14 MED ORDER — LEVOTHYROXINE SODIUM 25 MCG PO TABS: ORAL_TABLET | ORAL | 3 refills | Status: DC

## 2020-01-14 NOTE — Progress Notes (Signed)
Mar Daring, PA-C   Chief Complaint  Patient presents with  . Vaginal Discharge    discharge, sour/fishy odor, pain during intercourse x 1 week and a half  . Urinary Tract Infection    frequency and some burning x 1 week and a half    HPI:      Barbara Anderson is a 49 y.o. G3P0030 whose LMP was Patient's last menstrual period was 01/04/2020 (approximate)., presents today for increase d/c with odor, no irritation, for about 2 wks. No meds to treat, no new soaps/detergents. No prior abx use, but was started of flagyl and cipro BID for 2 wks yesterday due to diarrhea. Had constipation initially that turned into diarrhea. Has pelvic discomfort with dyspareunia since sx started. Pt also with urinary frequency, dysuria, increased LBP for the past wk or so. Had menses last wk, no hematuria/vaginal bleeding now. No fevers.  Hx of DM.  Pt would like diflucan Rx due to abx for 2 wks. Not taking probiotics.   Past Medical History:  Diagnosis Date  . ADD (attention deficit disorder)   . Anxiety   . Back pain   . Cholecystitis, acute 01/06/2019  . Concussion 2001   head trauma-work related. Caused headaches and short term memory loss.  . Depression   . Diabetes mellitus without complication ([place])   . Overactive bladder   . Recurrent vaginitis     Past Surgical History:  Procedure Laterality Date  . ankle pin and plates placement     right  . CHOLECYSTECTOMY N/A 01/06/2019   Procedure: LAPAROSCOPIC CHOLECYSTECTOMY;  Surgeon: Vickie Epley, MD;  Location: [facility] ORS;  Service: General;  Laterality: N/A;  . COLONOSCOPY WITH PROPOFOL N/A 12/11/2019   Procedure: COLONOSCOPY WITH PROPOFOL;  Surgeon: Lin Landsman, MD;  Location: [facility] ENDOSCOPY;  Service: Gastroenterology;  Laterality: N/A;  . COSMETIC SURGERY  2005   BREASTS, ARM, ABDOMEN AFTER WEIGHT LOSS    Family History  Problem Relation Age of Onset  . Diabetes Mother   . Hypertension Mother   . Depression Mother     . Diabetes Father   . Hypertension Father   . Anxiety disorder Father   . Depression Father   . ADD / ADHD Father   . Depression Sister   . Bipolar disorder Sister   . ADD / ADHD Brother     Social History   Socioeconomic History  . Marital status: Divorced    Spouse name: Not on file  . Number of children: 0  . Years of education: Not on file  . Highest education level: Bachelor's degree (e.g., BA, AB, BS)  Occupational History  . Occupation: disability  Tobacco Use  . Smoking status: Never Smoker  . Smokeless tobacco: Never Used  Vaping Use  . Vaping Use: Never used  Substance and Sexual Activity  . Alcohol use: No    Alcohol/week: 0.0 standard drinks  . Drug use: No  . Sexual activity: Yes    Birth control/protection: None  Other Topics Concern  . Not on file  Social History Narrative  . Not on file   Social Determinants of Health   Financial Resource Strain: High Risk  . Difficulty of Paying Living Expenses: Hard  Food Insecurity: Food Insecurity Present  . Worried About Charity fundraiser in the Last Year: Often true  . Ran Out of Food in the Last Year: Often true  Transportation Needs: No Transportation Needs  . Lack of Transportation (  Medical): No  . Lack of Transportation (Non-Medical): No  Physical Activity: Inactive  . Days of Exercise per Week: 0 days  . Minutes of Exercise per Session: 0 min  Stress: Stress Concern Present  . Feeling of Stress : Very much  Social Connections: Socially Isolated  . Frequency of Communication with Friends and Family: Once a week  . Frequency of Social Gatherings with Friends and Family: Once a week  . Attends Religious Services: Never  . Active Member of Clubs or Organizations: No  . Attends Archivist Meetings: Never  . Marital Status: Divorced  Human resources officer Violence: Not At Risk  . Fear of Current or Ex-Partner: No  . Emotionally Abused: No  . Physically Abused: No  . Sexually Abused: No     Outpatient Medications Prior to Visit  Medication Sig Dispense Refill  . Alcohol Swabs (B-D SINGLE USE SWABS REGULAR) PADS USE TO CLEANSE SKIN DAILY BEFORE CHECKING BLOOD SUGAR 100 each 1  . ALPRAZolam (XANAX) 1 MG tablet Take 1 mg by mouth 2 (two) times daily as needed.     Marland Kitchen amphetamine-dextroamphetamine (ADDERALL) 20 MG tablet Take 1 tablet (20 mg total) by mouth in the morning, at noon, and at bedtime. Has not started yet 11/10/19    . ARIPiprazole (ABILIFY) 5 MG tablet Take 5 mg by mouth daily.    Marland Kitchen atorvastatin (LIPITOR) 20 MG tablet Take 1 tablet (20 mg total) by mouth daily. 90 tablet 3  . Cholecalciferol (VITAMIN D3) 25 MCG (1000 UT) CAPS Take 1 capsule (1,000 Units total) by mouth daily. 60 capsule   . ciprofloxacin (CIPRO) 500 MG tablet Take 1 tablet (500 mg total) by mouth 2 (two) times daily for 14 days. 28 tablet 0  . DULoxetine (CYMBALTA) 60 MG capsule Take 2 capsules (120 mg total) by mouth daily. 60 capsule 0  . fluticasone (FLONASE) 50 MCG/ACT nasal spray USE 2 SPRAYS INTO BOTH NOSTRILS DAILY. 48 g 1  . folic acid (QC FOLIC ACID) 655 MCG tablet Take 1 tablet (800 mcg total) by mouth daily.    . Lancets Misc. (ACCU-CHEK FASTCLIX LANCET) KIT To check blood sugar once daily 1 kit 0  . levothyroxine (SYNTHROID) 25 MCG tablet TAKE 1 TABLET EVERY DAY BEFORE BREAKFAST 90 tablet 3  . metroNIDAZOLE (FLAGYL) 500 MG tablet Take 1 tablet (500 mg total) by mouth 2 (two) times daily for 14 days. 28 tablet 0  . pregabalin (LYRICA) 200 MG capsule Take 1 capsule (200 mg total) by mouth 2 (two) times daily. 180 capsule 1  . QUEtiapine Fumarate (SEROQUEL XR) 150 MG 24 hr tablet Take 150 mg by mouth at bedtime.     . topiramate (TOPAMAX) 200 MG tablet Take 1 tablet (200 mg total) by mouth at bedtime. (Patient not taking: Reported on 01/13/2020) 30 tablet 1  . traZODone (DESYREL) 150 MG tablet Take 1 tablet (150 mg total) by mouth at bedtime. (Patient not taking: Reported on 01/13/2020) 30 tablet 1    No facility-administered medications prior to visit.      ROS:  Review of Systems  Constitutional: Negative for fever.  Gastrointestinal: Positive for diarrhea. Negative for blood in stool, constipation, nausea and vomiting.  Genitourinary: Positive for dyspareunia, dysuria, frequency, pelvic pain and vaginal discharge. Negative for flank pain, hematuria, urgency, vaginal bleeding and vaginal pain.  Musculoskeletal: Positive for back pain.  Skin: Negative for rash.    OBJECTIVE:   Vitals:  BP 130/90   Ht '5\' 7"'  (1.702  m)   Wt 257 lb (116.6 kg)   LMP 01/04/2020 (Approximate)   BMI 40.25 kg/m   Physical Exam Vitals reviewed.  Constitutional:      Appearance: She is well-developed. She is not ill-appearing or toxic-appearing.  Pulmonary:     Effort: Pulmonary effort is normal.  Abdominal:     Tenderness: There is abdominal tenderness in the right lower quadrant, suprapubic area and left lower quadrant. There is no right CVA tenderness, left CVA tenderness, guarding or rebound.  Genitourinary:    General: Normal vulva.     Pubic Area: No rash.      Labia:        Right: No rash, tenderness or lesion.        Left: No rash, tenderness or lesion.      Vagina: Vaginal discharge present. No erythema or tenderness.     Cervix: Normal.     Uterus: Normal. Not enlarged and not tender.      Adnexa: Right adnexa normal and left adnexa normal.       Right: No mass or tenderness.         Left: No mass or tenderness.    Musculoskeletal:        General: Normal range of motion.     Cervical back: Normal range of motion.  Skin:    General: Skin is warm and dry.  Neurological:     General: No focal deficit present.     Mental Status: She is alert and oriented to person, place, and time.     Cranial Nerves: No cranial nerve deficit.  Psychiatric:        Mood and Affect: Mood normal.        Behavior: Behavior normal.        Thought Content: Thought content normal.         Judgment: Judgment normal.     Results: Results for orders placed or performed in visit on 01/15/20 (from the past 24 hour(s))  POCT Wet Prep with KOH     Status: Abnormal   Collection Time: 01/15/20 10:13 AM  Result Value Ref Range   Trichomonas, UA Negative    Clue Cells Wet Prep HPF POC pos    Epithelial Wet Prep HPF POC     Yeast Wet Prep HPF POC neg    Bacteria Wet Prep HPF POC     RBC Wet Prep HPF POC     WBC Wet Prep HPF POC     KOH Prep POC Positive (A) Negative  POCT Urinalysis Dipstick     Status: Abnormal   Collection Time: 01/15/20 10:14 AM  Result Value Ref Range   Color, UA yellow    Clarity, UA clear    Glucose, UA Negative Negative   Bilirubin, UA neg    Ketones, UA neg    Spec Grav, UA 1.015 1.010 - 1.025   Blood, UA neg    pH, UA 7.0 5.0 - 8.0   Protein, UA Negative Negative   Urobilinogen, UA     Nitrite, UA neg    Leukocytes, UA Small (1+) (A) Negative   Appearance     Odor       Assessment/Plan: BV (bacterial vaginosis) - Plan: fluconazole (DIFLUCAN) 150 MG tablet, POCT Wet Prep with KOH; Pos sx and wet prep. Pt just started flagyl yesterday. Add probiotics. F/u prn.   UTI symptoms - Plan: Urine Culture, POCT Urinalysis Dipstick; Check C&S. Just started cipro yesterday. Will f/u if  need to do different abx.   Pelvic pain--most likely due to GI sx/diarrhea. F/u prn.    Meds ordered this encounter  Medications  . fluconazole (DIFLUCAN) 150 MG tablet    Sig: Take 1 tablet (150 mg total) by mouth once for 1 dose. Can repeat Q3 days prn sx    Dispense:  4 tablet    Refill:  0    Order Specific Question:   Supervising Provider    Answer:   Gae Dry [[id number]]      Return if symptoms worsen or fail to improve.  Lenette Rau B. Azalea Cedar, PA-C 01/15/2020 10:16 AM

## 2020-01-15 ENCOUNTER — Ambulatory Visit (INDEPENDENT_AMBULATORY_CARE_PROVIDER_SITE_OTHER): Payer: Medicare HMO | Admitting: Obstetrics and Gynecology

## 2020-01-15 ENCOUNTER — Other Ambulatory Visit: Payer: Self-pay

## 2020-01-15 ENCOUNTER — Telehealth: Payer: Self-pay

## 2020-01-15 ENCOUNTER — Encounter: Payer: Self-pay | Admitting: Obstetrics and Gynecology

## 2020-01-15 VITALS — BP 130/90 | Ht 67.0 in | Wt 257.0 lb

## 2020-01-15 DIAGNOSIS — R399 Unspecified symptoms and signs involving the genitourinary system: Secondary | ICD-10-CM | POA: Diagnosis not present

## 2020-01-15 DIAGNOSIS — B9689 Other specified bacterial agents as the cause of diseases classified elsewhere: Secondary | ICD-10-CM

## 2020-01-15 DIAGNOSIS — R102 Pelvic and perineal pain: Secondary | ICD-10-CM

## 2020-01-15 DIAGNOSIS — N760 Acute vaginitis: Secondary | ICD-10-CM | POA: Diagnosis not present

## 2020-01-15 LAB — POCT URINALYSIS DIPSTICK
Bilirubin, UA: NEGATIVE
Blood, UA: NEGATIVE
Glucose, UA: NEGATIVE
Ketones, UA: NEGATIVE
Nitrite, UA: NEGATIVE
Protein, UA: NEGATIVE
Spec Grav, UA: 1.015 (ref 1.010–1.025)
pH, UA: 7 (ref 5.0–8.0)

## 2020-01-15 LAB — POCT WET PREP WITH KOH
Clue Cells Wet Prep HPF POC: POSITIVE
KOH Prep POC: POSITIVE — AB
Trichomonas, UA: NEGATIVE
Yeast Wet Prep HPF POC: NEGATIVE

## 2020-01-15 MED ORDER — FLUCONAZOLE 150 MG PO TABS: 150.0000 mg | ORAL_TABLET | Freq: Once | ORAL | 0 refills | Status: AC

## 2020-01-15 NOTE — Patient Instructions (Signed)
I value your feedback and entrusting us with your care. If you get a [facility] patient survey, I would appreciate you taking the time to let us know about your experience today. Thank you!  As of July 09, 2019, your lab results will be released to your MyChart immediately, before I even have a chance to see them. Please give me time to review them and contact you if there are any abnormalities. Thank you for your patience.  

## 2020-01-17 LAB — URINE CULTURE: Organism ID, Bacteria: NO GROWTH

## 2020-01-17 NOTE — Progress Notes (Signed)
Pls let pt know C&S neg which is good since she is already on cipro. F/u prn.

## 2020-01-18 ENCOUNTER — Other Ambulatory Visit: Payer: Self-pay

## 2020-01-18 ENCOUNTER — Ambulatory Visit: Payer: Self-pay | Admitting: Pharmacist

## 2020-01-18 DIAGNOSIS — N2581 Secondary hyperparathyroidism of renal origin: Secondary | ICD-10-CM

## 2020-01-18 DIAGNOSIS — E1129 Type 2 diabetes mellitus with other diabetic kidney complication: Secondary | ICD-10-CM

## 2020-01-18 NOTE — Progress Notes (Signed)
Pt aware.

## 2020-01-18 NOTE — Chronic Care Management (AMB) (Signed)
Chronic Care Management Pharmacy  Name: Barbara Anderson  MRN: [id number] DOB: [birth year]  Chief Complaint/ HPI  Barbara Anderson,  49 y.o. , female presents for their Follow-Up CCM visit with the clinical pharmacist via telephone due to COVID-19 Pandemic.  PCP : Mar Daring, PA-C  Their chronic conditions include: HTN, HLD, DM, CKD3  Office Visits: NA  Consult Visit: 6/18 Vaginosis, [place], BP 130/90 Wt 257 BMI 40.3, Diflucan 115m x 1, Flagyl, Cipro 6/16 abdominal pain, [place], BP 137/81 P 112,   Medications: Outpatient Encounter Medications as of 01/18/2020  Medication Sig  . Alcohol Swabs (B-D SINGLE USE SWABS REGULAR) PADS USE TO CLEANSE SKIN DAILY BEFORE CHECKING BLOOD SUGAR  . ALPRAZolam (XANAX) 1 MG tablet Take 1 mg by mouth 2 (two) times daily as needed.   .Marland Kitchenamphetamine-dextroamphetamine (ADDERALL) 20 MG tablet Take 1 tablet (20 mg total) by mouth in the morning, at noon, and at bedtime. Has not started yet 11/10/19  . ARIPiprazole (ABILIFY) 5 MG tablet Take 5 mg by mouth daily.  .Marland Kitchenatorvastatin (LIPITOR) 20 MG tablet Take 1 tablet (20 mg total) by mouth daily.  . Cholecalciferol (VITAMIN D3) 25 MCG (1000 UT) CAPS Take 1 capsule (1,000 Units total) by mouth daily.  . ciprofloxacin (CIPRO) 500 MG tablet Take 1 tablet (500 mg total) by mouth 2 (two) times daily for 14 days.  . DULoxetine (CYMBALTA) 60 MG capsule Take 2 capsules (120 mg total) by mouth daily.  . fluticasone (FLONASE) 50 MCG/ACT nasal spray USE 2 SPRAYS INTO BOTH NOSTRILS DAILY.  . folic acid (QC FOLIC ACID) 8149MCG tablet Take 1 tablet (800 mcg total) by mouth daily.  . Lancets Misc. (ACCU-CHEK FASTCLIX LANCET) KIT To check blood sugar once daily  . levothyroxine (SYNTHROID) 25 MCG tablet TAKE 1 TABLET EVERY DAY BEFORE BREAKFAST  . metroNIDAZOLE (FLAGYL) 500 MG tablet Take 1 tablet (500 mg total) by mouth 2 (two) times daily for 14 days.  . pregabalin (LYRICA) 200 MG capsule Take 1 capsule (200 mg total) by  mouth 2 (two) times daily.  . QUEtiapine Fumarate (SEROQUEL XR) 150 MG 24 hr tablet Take 150 mg by mouth at bedtime.   . topiramate (TOPAMAX) 200 MG tablet Take 1 tablet (200 mg total) by mouth at bedtime. (Patient not taking: Reported on 01/13/2020)  . traZODone (DESYREL) 150 MG tablet Take 1 tablet (150 mg total) by mouth at bedtime. (Patient not taking: Reported on 01/13/2020)   No facility-administered encounter medications on file as of 01/18/2020.      Financial Resource Strain: High Risk  . Difficulty of Paying Living Expenses: Hard   Current Diagnosis/Assessment:  Goals Addressed            This Visit's Progress   . Hyperlipidemia - goal LDL < 100       CARE PLAN ENTRY (see longitudinal plan of care for additional care plan information)  Current Barriers:  . Uncontrolled hyperlipidemia, complicated by diabetes, hypertension, chronic pain . Current antihyperlipidemic regimen: Lipitor 187mdaily . Previous antihyperlipidemic medications tried NA . Most recent lipid panel:     Component Value Date/Time   CHOL 202 (H) 04/11/2017 0702   TRIG 149 04/11/2017 0702   HDL 33 (L) 04/11/2017 0702   CHOLHDL 6.1 04/11/2017 0702   VLDL 30 04/11/2017 0702   LDLCALC 139 (H) 04/11/2017 0702 .    Pharmacist Clinical Goal(s):  . Marland Kitchenver the next 30 days, patient will work with PharmD and providers towards optimized antihyperlipidemic  therapy  Interventions: . Comprehensive medication review performed; medication list updated in electronic medical record.  Bertram Savin care team collaboration (see longitudinal plan of care) . Increase atorvastatin 23m 1 tab by mouth daily . Patient tolerating Lipitor increase well  Patient Self Care Activities:  . Patient will focus on medication adherence by filling and taking new  strength of cholesterol medication  Initial goal documentation       Hyperlipidemia   LDL goal < 100  Lipid Panel     Component Value Date/Time   CHOL  202 (H) 04/11/2017 0702   TRIG 149 04/11/2017 0702   HDL 33 (L) 04/11/2017 0702   LDLCALC 139 (H) 04/11/2017 0702    Hepatic Function Latest Ref Rng & Units 10/05/2019 01/07/2019 01/05/2019  Total Protein 6.0 - 8.5 g/dL 6.9 6.4(L) 7.1  Albumin 3.8 - 4.8 g/dL 4.2 3.1(L) 3.7  AST 0 - 40 IU/L 18 35 16  ALT 0 - 32 IU/L 18 35 14  Alk Phosphatase 39 - 117 IU/L 155(H) 90 104  Total Bilirubin 0.0 - 1.2 mg/dL 0.3 0.4 0.2(L)  Bilirubin, Direct 0.0 - 0.2 mg/dL - - -     The 10-year ASCVD risk score (Mikey BussingDC Jr., et al., 2013) is: 4.7%   Values used to calculate the score:     Age: [age]years     Sex: Female     Is Non-Hispanic African American: No     Diabetic: Yes     Tobacco smoker: No     Systolic Blood Pressure: 1146mmHg     Is BP treated: No     HDL Cholesterol: 33 mg/dL     Total Cholesterol: 202 mg/dL   Patient has failed these meds in past: NA Patient is currently controlled on the following medications:  . Lipitor 238mdaily  We discussed:   How tolerating Lipitor increase? No problems Results don't reflect impact of Lipitor increase  Plan  Continue current medications  Medication Management   Pt uses [facility] all medications Uses pill box? Yes Pt endorses 100% compliance  We discussed:  Stopped Vitamin D2? Yes Ask about TSH lab  Plan  Continue current medication management strategy  Follow up: 3 month phone visit  TeMilus HeightPharmD, [phone]

## 2020-01-19 NOTE — Patient Instructions (Addendum)
Visit Information  Goals Addressed            This Visit's Progress   . Hyperlipidemia - goal LDL < 100       CARE PLAN ENTRY (see longitudinal plan of care for additional care plan information)  Current Barriers:  . Uncontrolled hyperlipidemia, complicated by diabetes, hypertension, chronic pain . Current antihyperlipidemic regimen: Lipitor 10mg  daily . Previous antihyperlipidemic medications tried NA . Most recent lipid panel:     Component Value Date/Time   CHOL 202 (H) 04/11/2017 0702   TRIG 149 04/11/2017 0702   HDL 33 (L) 04/11/2017 0702   CHOLHDL 6.1 04/11/2017 0702   VLDL 30 04/11/2017 0702   LDLCALC 139 (H) 04/11/2017 0702 .    Pharmacist Clinical Goal(s):  Marland Kitchen Over the next 30 days, patient will work with PharmD and providers towards optimized antihyperlipidemic therapy  Interventions: . Comprehensive medication review performed; medication list updated in electronic medical record.  Bertram Savin care team collaboration (see longitudinal plan of care) . Increase atorvastatin 20mg  1 tab by mouth daily . Patient tolerating Lipitor increase well  Patient Self Care Activities:  . Patient will focus on medication adherence by filling and taking new  strength of cholesterol medication  Initial goal documentation        Print copy of patient instructions provided.   Telephone follow up appointment with pharmacy team member scheduled for: 3 months  Milus Height, PharmD, BCGP, [place] [phone]   Mixed Bipolar Disorder Mixed bipolar disorder is a mental health disorder in which a person has episodes of emotional highs (mania), lows (depression), or both of these feelings at the same time. People with this disorder have very big mood changes (mood swings) that happen quickly on a regular basis. These episodes may be severe enough to cause problems with relationships, school, or work. In some cases, they can  cause the person to be unsafe, and the person may need to be hospitalized. What are the causes? The cause of this condition is not known. What increases the risk? The following factors may make you more likely to develop this condition:  Having a family history of the disorder.  Abusing substances such as alcohol or drugs.  Having an anxiety disorder.  Having another illness, such as heart disease or thyroid disease. What are the signs or symptoms? Symptoms of this condition include having episodes of mania, depression, and sometimes symptoms of both at the same time. For instance, you may feel sad and full of energy at the same time. You may have mood swings almost every day. Symptoms of mania may include:  Very high self-esteem or self-confidence.  Being unusually talkative, or feeling a need to keep talking. Speech may be very fast. It may seem like you cannot stop talking.  Racing thoughts or constant talking, with quick shifts between topics that may or may not be related (flight of ideas).  Decreased ability to focus or concentrate.  Increased purposeful activity, such as work, study, or social activity.  Increased nonproductive activity. This could be pacing, squirming and fidgeting, or finger and toe tapping.  Impulsive behavior and poor judgment. This may result in high-risk activities, such as having unprotected sex or spending a lot of money. Symptoms of depression may include:  Feeling sad, hopeless, or helpless.  Lack of feeling or caring about anything.  Not being able to enjoy things that you used to enjoy.  Trouble concentrating or remembering.  Trouble making  decisions.  Thoughts of death, or desire to harm yourself. How is this diagnosed? This condition may be diagnosed based on:  Your symptoms and medical history. Your health care provider will ask about your emotional episodes.  A physical exam. This is done to rule out any health problems that may  be causing symptoms. Your health care provider will also ask about your alcohol and drug use. How is this treated? Bipolar disorder is a long-term (chronic) illness. It is best controlled with treatment that is given on an ongoing basis, rather than only when symptoms are present. Treatment may include:  Medicine. Medicine can be prescribed by a provider who specializes in treating mental disorders (psychiatrist). ? Medicines called mood stabilizers, antipsychotics, or antidepressants may be prescribed. ? If symptoms occur even while one type of medicine is taken, other medicines may be added.  Psychotherapy. Some forms of talk therapy, such as cognitive behavioral therapy (CBT), can provide support, education, and guidance.  Coping methods, such as journaling or relaxation exercises. These may include: ? Yoga. ? Meditation. ? Deep breathing.  Lifestyle changes, such as: ? Limiting alcohol and drug use. ? Exercising regularly. ? Getting plenty of sleep. ? Making healthy eating choices. A combination of medicine, talk therapy, and coping methods is best. In severe cases, if other treatments do not work, a procedure may be used to change the brain chemicals that send messages between brain cells (neurotransmitters). This procedure, called electroconvulsive therapy (ECT), applies short electrical pulses to the brain through the scalp. Follow these instructions at home: Activity   Return to your normal activities as told by your health care provider.  Find activities that you enjoy, and make time to do them.  Exercise regularly as told by your health care provider. Lifestyle  Follow a set schedule for eating and sleeping.  Eat a balanced diet that includes fresh fruits and vegetables, whole grains, low-fat dairy products, and lean meats.  Get 7-8 or more hours of sleep each night.  Do not drink alcohol or use illegal drugs. General instructions  Take over-the-counter and  prescription medicines only as told by your health care provider.  Think about joining a support group. Your health care provider may be able to recommend a group for you.  Talk with your family and loved ones about your treatment goals and about how they can help.  Keep all follow-up visits as told by your health care provider. This is important. Contact a health care provider if:  Your symptoms get worse.  You have side effects from your medicine.  You have trouble sleeping.  You have trouble doing daily activities.  You feel unsafe in your surroundings.  You are dealing with substance abuse. Get help right away if:  You think about hurting yourself or you try to hurt yourself.  You think about suicide. If you ever feel like you may hurt yourself or others, or have thoughts about taking your own life, get help right away. You can go to your nearest emergency department or call:  Your local emergency services (911 in the [place]  A suicide crisis helpline, such as the [facility] at 712 220 7383. This is open 24 hours a day. Summary  Mixed bipolar disorder is a mental health disorder in which a person has episodes of emotional highs (mania), lows (depression), or both of these feelings at the same time.  Bipolar disorder is a long-term (chronic) illness. It is best controlled with treatment that is given  on an ongoing basis, rather than only when symptoms are present.  A combination of medicine, talk therapy, and coping methods is best for treating this condition. This information is not intended to replace advice given to you by your health care provider. Make sure you discuss any questions you have with your health care provider. Document Revised: 06/28/2017 Document Reviewed: 11/09/2016 Elsevier Patient Education  [place].

## 2020-01-22 ENCOUNTER — Other Ambulatory Visit: Payer: Self-pay | Admitting: Physician Assistant

## 2020-01-22 ENCOUNTER — Telehealth: Payer: Self-pay

## 2020-01-22 DIAGNOSIS — E119 Type 2 diabetes mellitus without complications: Secondary | ICD-10-CM

## 2020-01-22 NOTE — Telephone Encounter (Signed)
Received another fax from [facility] asking for clarification on Rx faxed to them. All that was needed was tablet strength. Per [facility]'s last notes, 150 mg tablet. Fax that was sent to [facility] also had strength missing.

## 2020-01-23 ENCOUNTER — Other Ambulatory Visit: Payer: Self-pay | Admitting: Physician Assistant

## 2020-02-04 ENCOUNTER — Ambulatory Visit (INDEPENDENT_AMBULATORY_CARE_PROVIDER_SITE_OTHER): Payer: Medicare HMO | Admitting: Physician Assistant

## 2020-02-04 ENCOUNTER — Encounter: Payer: Self-pay | Admitting: Physician Assistant

## 2020-02-04 ENCOUNTER — Other Ambulatory Visit: Payer: Self-pay

## 2020-02-04 VITALS — BP 132/86 | HR 82 | Temp 97.8°F | Resp 16 | Wt 259.0 lb

## 2020-02-04 DIAGNOSIS — R809 Proteinuria, unspecified: Secondary | ICD-10-CM | POA: Diagnosis not present

## 2020-02-04 DIAGNOSIS — E1129 Type 2 diabetes mellitus with other diabetic kidney complication: Secondary | ICD-10-CM | POA: Diagnosis not present

## 2020-02-04 DIAGNOSIS — I10 Essential (primary) hypertension: Secondary | ICD-10-CM | POA: Diagnosis not present

## 2020-02-04 DIAGNOSIS — Z6841 Body Mass Index (BMI) 40.0 and over, adult: Secondary | ICD-10-CM

## 2020-02-04 DIAGNOSIS — N1831 Chronic kidney disease, stage 3a: Secondary | ICD-10-CM | POA: Diagnosis not present

## 2020-02-04 DIAGNOSIS — I129 Hypertensive chronic kidney disease with stage 1 through stage 4 chronic kidney disease, or unspecified chronic kidney disease: Secondary | ICD-10-CM | POA: Diagnosis not present

## 2020-02-04 DIAGNOSIS — Z0000 Encounter for general adult medical examination without abnormal findings: Secondary | ICD-10-CM

## 2020-02-04 DIAGNOSIS — E559 Vitamin D deficiency, unspecified: Secondary | ICD-10-CM

## 2020-02-04 DIAGNOSIS — F315 Bipolar disorder, current episode depressed, severe, with psychotic features: Secondary | ICD-10-CM | POA: Diagnosis not present

## 2020-02-04 DIAGNOSIS — S069X9D Unspecified intracranial injury with loss of consciousness of unspecified duration, subsequent encounter: Secondary | ICD-10-CM

## 2020-02-04 LAB — POCT UA - MICROALBUMIN: Microalbumin Ur, POC: 20 mg/L

## 2020-02-04 NOTE — Patient Instructions (Addendum)
[facility] [address] [place],  [place]  [address] Main: [phone]  Health Maintenance, Female Adopting a healthy lifestyle and getting preventive care are important in promoting health and wellness. Ask your health care provider about:  The right schedule for you to have regular tests and exams.  Things you can do on your own to prevent diseases and keep yourself healthy. What should I know about diet, weight, and exercise? Eat a healthy diet   Eat a diet that includes plenty of vegetables, fruits, low-fat dairy products, and lean protein.  Do not eat a lot of foods that are high in solid fats, added sugars, or sodium. Maintain a healthy weight Body mass index (BMI) is used to identify weight problems. It estimates body fat based on height and weight. Your health care provider can help determine your BMI and help you achieve or maintain a healthy weight. Get regular exercise Get regular exercise. This is one of the most important things you can do for your health. Most adults should:  Exercise for at least 150 minutes each week. The exercise should increase your heart rate and make you sweat (moderate-intensity exercise).  Do strengthening exercises at least twice a week. This is in addition to the moderate-intensity exercise.  Spend less time sitting. Even light physical activity can be beneficial. Watch cholesterol and blood lipids Have your blood tested for lipids and cholesterol at 51 years of age, then have this test every 5 years. Have your cholesterol levels checked more often if:  Your lipid or cholesterol levels are high.  You are older than 51 years of age.  You are at high risk for heart disease. What should I know about cancer screening? Depending on your health history and family history, you may need to have cancer screening at various ages. This may include screening for:  Breast cancer.  Cervical  cancer.  Colorectal cancer.  Skin cancer.  Lung cancer. What should I know about heart disease, diabetes, and high blood pressure? Blood pressure and heart disease  High blood pressure causes heart disease and increases the risk of stroke. This is more likely to develop in people who have high blood pressure readings, are of African descent, or are overweight.  Have your blood pressure checked: ? Every 3-5 years if you are 18-39 years of age. ? Every year if you are 40 years old or older. Diabetes Have regular diabetes screenings. This checks your fasting blood sugar level. Have the screening done:  Once every three years after age 40 if you are at a normal weight and have a low risk for diabetes.  More often and at a younger age if you are overweight or have a high risk for diabetes. What should I know about preventing infection? Hepatitis B If you have a higher risk for hepatitis B, you should be screened for this virus. Talk with your health care provider to find out if you are at risk for hepatitis B infection. Hepatitis C Testing is recommended for:  Everyone born from 1945 through 1965.  Anyone with known risk factors for hepatitis C. Sexually transmitted infections (STIs)  Get screened for STIs, including gonorrhea and chlamydia, if: ? You are sexually active and are younger than 51 years of age. ? You are older than 51 years of age and your health care provider tells you that you are at risk for this type of infection. ? Your sexual activity has changed since you   were last screened, and you are at increased risk for chlamydia or gonorrhea. Ask your health care provider if you are at risk.  Ask your health care provider about whether you are at high risk for HIV. Your health care provider may recommend a prescription medicine to help prevent HIV infection. If you choose to take medicine to prevent HIV, you should first get tested for HIV. You should then be tested every 3  months for as long as you are taking the medicine. Pregnancy  If you are about to stop having your period (premenopausal) and you may become pregnant, seek counseling before you get pregnant.  Take 400 to 800 micrograms (mcg) of folic acid every day if you become pregnant.  Ask for birth control (contraception) if you want to prevent pregnancy. Osteoporosis and menopause Osteoporosis is a disease in which the bones lose minerals and strength with aging. This can result in bone fractures. If you are 65 years old or older, or if you are at risk for osteoporosis and fractures, ask your health care provider if you should:  Be screened for bone loss.  Take a calcium or vitamin D supplement to lower your risk of fractures.  Be given hormone replacement therapy (HRT) to treat symptoms of menopause. Follow these instructions at home: Lifestyle  Do not use any products that contain nicotine or tobacco, such as cigarettes, e-cigarettes, and chewing tobacco. If you need help quitting, ask your health care provider.  Do not use street drugs.  Do not share needles.  Ask your health care provider for help if you need support or information about quitting drugs. Alcohol use  Do not drink alcohol if: ? Your health care provider tells you not to drink. ? You are pregnant, may be pregnant, or are planning to become pregnant.  If you drink alcohol: ? Limit how much you use to 0-1 drink a day. ? Limit intake if you are breastfeeding.  Be aware of how much alcohol is in your drink. In the [place] one drink equals one 12 oz bottle of beer (355 mL), one 5 oz glass of wine (148 mL), or one 1 oz glass of hard liquor (44 mL). General instructions  Schedule regular health, dental, and eye exams.  Stay current with your vaccines.  Tell your health care provider if: ? You often feel depressed. ? You have ever been abused or do not feel safe at home. Summary  Adopting a healthy lifestyle and getting  preventive care are important in promoting health and wellness.  Follow your health care provider's instructions about healthy diet, exercising, and getting tested or screened for diseases.  Follow your health care provider's instructions on monitoring your cholesterol and blood pressure. This information is not intended to replace advice given to you by your health care provider. Make sure you discuss any questions you have with your health care provider. Document Revised: 07/09/2018 Document Reviewed: 07/09/2018 [employer] Patient Education  2020 [employer]  

## 2020-02-04 NOTE — Progress Notes (Signed)
I,Roshena L Chambers,acting as a scribe for [employer], PA-C.,have documented all relevant documentation on the behalf of Mar Daring, PA-C,as directed by  Mar Daring, PA-C while in the presence of Mar Daring, [place].   Complete physical exam   Patient: Barbara Anderson   DOB: 1971/02/10   49 y.o. Female  MRN: [id number] Visit Date: 02/04/2020  Today's healthcare provider: Mar Daring, PA-C   Chief Complaint  Patient presents with  . Annual Exam   Subjective    Barbara Anderson is a 49 y.o. female who presents today for a complete physical exam.  She reports consuming a general diet. The patient does not participate in regular exercise at present. She generally feels fairly well. She reports sleeping poorly. She does have additional problems to discuss today.   Had AWV with [facility] on 11/10/2019 Last Colonoscopy: 12/11/2019(poor prep; should repeat in 6 months) Last Mammogram: 01/22/06. Repeat every 1-2 years as advised. Ordered 07/08/19. Pt plans to call and set up appt this year Last Pap smear: 07/08/2019 Normal/ HPV negative (done through [facility])  HPI  Hypothyroid, follow-up  Lab Results  Component Value Date   TSH 1.840 01/01/2019   TSH 2.360 09/26/2018   TSH 6.573 (H) 08/16/2018   FREET4 1.00 09/26/2018   FREET4 0.78 (L) 08/16/2018   T4TOTAL 5.4 01/01/2019   Wt Readings from Last 3 Encounters:  02/04/20 259 lb (117.5 kg)  01/15/20 257 lb (116.6 kg)  01/13/20 253 lb 4 oz (114.9 kg)    She was last seen for hypothyroid 1 year ago.  Management since that visit includes continue same medication. She reports good compliance with treatment. She is having side effects. She believes her thyroid levels are off due to symptoms of fatigue, weight gain and brittle nails.   Symptoms: Yes change in energy level No constipation  No diarrhea No heat / cold intolerance  No nervousness No palpitations  Yes weight changes Brittle nails    -----------------------------------------------------------------------------------------  Past Medical History:  Diagnosis Date  . ADD (attention deficit disorder)   . Anxiety   . Back pain   . Cholecystitis, acute 01/06/2019  . Concussion 2001   head trauma-work related. Caused headaches and short term memory loss.  . Depression   . Diabetes mellitus without complication ([place])   . Overactive bladder   . Recurrent vaginitis    Past Surgical History:  Procedure Laterality Date  . ankle pin and plates placement     right  . CHOLECYSTECTOMY N/A 01/06/2019   Procedure: LAPAROSCOPIC CHOLECYSTECTOMY;  Surgeon: Vickie Epley, MD;  Location: [facility] ORS;  Service: General;  Laterality: N/A;  . COLONOSCOPY WITH PROPOFOL N/A 12/11/2019   Procedure: COLONOSCOPY WITH PROPOFOL;  Surgeon: Lin Landsman, MD;  Location: [facility] ENDOSCOPY;  Service: Gastroenterology;  Laterality: N/A;  . COSMETIC SURGERY  2005   BREASTS, ARM, ABDOMEN AFTER WEIGHT LOSS   Social History   Socioeconomic History  . Marital status: Divorced    Spouse name: Not on file  . Number of children: 0  . Years of education: Not on file  . Highest education level: Bachelor's degree (e.g., BA, AB, BS)  Occupational History  . Occupation: disability  Tobacco Use  . Smoking status: Never Smoker  . Smokeless tobacco: Never Used  Vaping Use  . Vaping Use: Never used  Substance and Sexual Activity  . Alcohol use: No    Alcohol/week: 0.0 standard drinks  . Drug use: No  .  Sexual activity: Yes    Birth control/protection: None  Other Topics Concern  . Not on file  Social History Narrative  . Not on file   Social Determinants of Health   Financial Resource Strain: High Risk  . Difficulty of Paying Living Expenses: Hard  Food Insecurity: Food Insecurity Present  . Worried About Charity fundraiser in the Last Year: Often true  . Ran Out of Food in the Last Year: Often true  Transportation Needs: No  Transportation Needs  . Lack of Transportation (Medical): No  . Lack of Transportation (Non-Medical): No  Physical Activity: Inactive  . Days of Exercise per Week: 0 days  . Minutes of Exercise per Session: 0 min  Stress: Stress Concern Present  . Feeling of Stress : Very much  Social Connections: Socially Isolated  . Frequency of Communication with Friends and Family: Once a week  . Frequency of Social Gatherings with Friends and Family: Once a week  . Attends Religious Services: Never  . Active Member of Clubs or Organizations: No  . Attends Archivist Meetings: Never  . Marital Status: Divorced  Human resources officer Violence: Not At Risk  . Fear of Current or Ex-Partner: No  . Emotionally Abused: No  . Physically Abused: No  . Sexually Abused: No   Family Status  Relation Name Status  . Mother  Deceased at age 31       kidney failure  . Father  Deceased  . Sister  Alive  . Brother  Alive   Family History  Problem Relation Age of Onset  . Diabetes Mother   . Hypertension Mother   . Depression Mother   . Diabetes Father   . Hypertension Father   . Anxiety disorder Father   . Depression Father   . ADD / ADHD Father   . Depression Sister   . Bipolar disorder Sister   . ADD / ADHD Brother    Allergies  Allergen Reactions  . Metformin And Related Diarrhea    Patient Care Team: Mar Daring, PA-C as PCP - General (Family Medicine) Nevada Crane, MD as Consulting Physician (Psychiatry) Lavonia Dana, MD as Consulting Physician (Nephrology) Chinita Pester, MD as Referring Physician (Anesthesiology) Lorelee Cover., MD (Ophthalmology) Verdia Kuba, [facility] (Pharmacist)   Medications: Outpatient Medications Prior to Visit  Medication Sig  . Alcohol Swabs (B-D SINGLE USE SWABS REGULAR) PADS USE TO CLEANSE SKIN EVERY DAY BEFORE CHECKING BLOOD SUGAR  . ALPRAZolam (XANAX) 1 MG tablet Take 1 mg by mouth 2 (two) times daily as needed.   Marland Kitchen  amphetamine-dextroamphetamine (ADDERALL) 20 MG tablet Take 1 tablet (20 mg total) by mouth in the morning, at noon, and at bedtime. Has not started yet 11/10/19  . ARIPiprazole (ABILIFY) 5 MG tablet Take 5 mg by mouth daily.  Marland Kitchen atorvastatin (LIPITOR) 20 MG tablet Take 1 tablet (20 mg total) by mouth daily.  . Cholecalciferol (VITAMIN D3) 25 MCG (1000 UT) CAPS Take 1 capsule (1,000 Units total) by mouth daily.  . DULoxetine (CYMBALTA) 60 MG capsule Take 2 capsules (120 mg total) by mouth daily.  . fluticasone (FLONASE) 50 MCG/ACT nasal spray USE 2 SPRAYS INTO BOTH NOSTRILS DAILY.  . folic acid (QC FOLIC ACID) 500 MCG tablet Take 1 tablet (800 mcg total) by mouth daily.  . Lancets Misc. (ACCU-CHEK FASTCLIX LANCET) KIT To check blood sugar once daily  . levothyroxine (SYNTHROID) 25 MCG tablet TAKE 1 TABLET EVERY DAY BEFORE BREAKFAST  .  pregabalin (LYRICA) 200 MG capsule Take 1 capsule (200 mg total) by mouth 2 (two) times daily.  Marland Kitchen topiramate (TOPAMAX) 200 MG tablet Take 1 tablet (200 mg total) by mouth at bedtime.  Marland Kitchen QUEtiapine Fumarate (SEROQUEL XR) 150 MG 24 hr tablet Take 150 mg by mouth at bedtime.   . traZODone (DESYREL) 150 MG tablet Take 1 tablet (150 mg total) by mouth at bedtime. (Patient not taking: Reported on 01/13/2020)   No facility-administered medications prior to visit.    Review of Systems  Constitutional: Negative for chills, fatigue and fever.  HENT: Negative for congestion, ear pain, rhinorrhea, sneezing and sore throat.   Eyes: Negative for pain and redness.  Respiratory: Negative for cough, shortness of breath and wheezing.   Cardiovascular: Negative for chest pain and leg swelling.  Gastrointestinal: Negative for abdominal pain, blood in stool, constipation, diarrhea and nausea.  Endocrine: Negative for polydipsia and polyphagia.  Genitourinary: Negative for dysuria, flank pain, hematuria, pelvic pain, vaginal bleeding and vaginal discharge.  Musculoskeletal: Negative  for arthralgias, back pain, gait problem and joint swelling.  Skin: Negative for rash.  Allergic/Immunologic: Negative.   Neurological: Negative for dizziness, tremors, seizures, weakness, light-headedness, numbness and headaches.  Hematological: Negative for adenopathy.  Psychiatric/Behavioral: Negative for behavioral problems, confusion and dysphoric mood. The patient is not nervous/anxious and is not hyperactive.     Last CBC Lab Results  Component Value Date   WBC 7.8 01/13/2020   HGB 11.9 01/13/2020   HCT 36.4 01/13/2020   MCV 91 01/13/2020   MCH 29.7 01/13/2020   RDW 14.0 01/13/2020   PLT 248 78/93/8101   Last metabolic panel Lab Results  Component Value Date   GLUCOSE 91 10/05/2019   NA 139 10/05/2019   K 4.0 10/05/2019   CL 104 10/05/2019   CO2 21 10/05/2019   BUN 8 10/05/2019   CREATININE 1.00 01/13/2020   GFRNONAA 63 10/05/2019   GFRAA 72 10/05/2019   CALCIUM 9.2 10/05/2019   PROT 6.9 10/05/2019   ALBUMIN 4.2 10/05/2019   LABGLOB 2.7 10/05/2019   AGRATIO 1.6 10/05/2019   BILITOT 0.3 10/05/2019   ALKPHOS 155 (H) 10/05/2019   AST 18 10/05/2019   ALT 18 10/05/2019   ANIONGAP 7 01/07/2019      Objective    BP 132/86 (BP Location: Left Arm, Cuff Size: Large)   Pulse 82   Temp 97.8 F (36.6 C) (Oral)   Resp 16   Wt 259 lb (117.5 kg)   BMI 40.57 kg/m  BP Readings from Last 3 Encounters:  02/04/20 132/86  01/15/20 130/90  01/13/20 137/81   Wt Readings from Last 3 Encounters:  02/04/20 259 lb (117.5 kg)  01/15/20 257 lb (116.6 kg)  01/13/20 253 lb 4 oz (114.9 kg)      Physical Exam Vitals reviewed.  Constitutional:      General: She is not in acute distress.    Appearance: Normal appearance. She is well-developed and well-groomed. She is obese. She is not diaphoretic.  HENT:     Head: Normocephalic and atraumatic.     Right Ear: Tympanic membrane, ear canal and external ear normal.     Left Ear: Tympanic membrane, ear canal and external ear  normal.     Nose: Nose normal.     Mouth/Throat:     Mouth: Mucous membranes are moist.     Pharynx: Oropharynx is clear. No oropharyngeal exudate or posterior oropharyngeal erythema.  Eyes:     General: No scleral  icterus.       Right eye: No discharge.        Left eye: No discharge.     Extraocular Movements: Extraocular movements intact.     Conjunctiva/sclera: Conjunctivae normal.     Pupils: Pupils are equal, round, and reactive to light.  Neck:     Thyroid: No thyromegaly.     Vascular: No carotid bruit or JVD.     Trachea: No tracheal deviation.  Cardiovascular:     Rate and Rhythm: Normal rate and regular rhythm.     Pulses: Normal pulses.     Heart sounds: Normal heart sounds. No murmur heard.  No friction rub. No gallop.   Pulmonary:     Effort: Pulmonary effort is normal. No respiratory distress.     Breath sounds: Normal breath sounds. No wheezing or rales.  Chest:     Chest wall: No tenderness.  Abdominal:     General: Abdomen is protuberant. Bowel sounds are normal. There is no distension.     Palpations: Abdomen is soft. There is no mass.     Tenderness: There is no abdominal tenderness. There is no guarding or rebound.  Genitourinary:    Comments: Followed by GYN Musculoskeletal:        General: No tenderness. Normal range of motion.     Cervical back: Normal range of motion and neck supple.  Lymphadenopathy:     Cervical: No cervical adenopathy.  Skin:    General: Skin is warm and dry.     Capillary Refill: Capillary refill takes less than 2 seconds.     Findings: No rash.  Neurological:     General: No focal deficit present.     Mental Status: She is alert and oriented to person, place, and time. Mental status is at baseline.  Psychiatric:        Mood and Affect: Mood normal.        Behavior: Behavior normal. Behavior is cooperative.        Thought Content: Thought content normal.        Judgment: Judgment normal.      Last depression screening  scores PHQ 2/9 Scores 07/08/2019 11/28/2018 05/09/2017  PHQ - 2 Score '3 2 2  ' PHQ- 9 Score '7 13 13   ' Last fall risk screening Fall Risk  11/10/2019  Falls in the past year? 1  Number falls in past yr: 1  Injury with Fall? 0  Risk for fall due to : Other (Comment)  Risk for fall due to: Comment Due to dizziness and knees are giving out.  Follow up Falls prevention discussed   Last Audit-C alcohol use screening Alcohol Use Disorder Test (AUDIT) 11/10/2019  1. How often do you have a drink containing alcohol? 0  2. How many drinks containing alcohol do you have on a typical day when you are drinking? 0  3. How often do you have six or more drinks on one occasion? 0  AUDIT-C Score 0  Alcohol Brief Interventions/Follow-up AUDIT Score <7 follow-up not indicated  Some encounter information is confidential and restricted. Go to Review Flowsheets activity to see all data.   A score of 3 or more in women, and 4 or more in men indicates increased risk for alcohol abuse, EXCEPT if all of the points are from question 1   No results found for any visits on 02/04/20.  Assessment & Plan    Routine Health Maintenance and Physical Exam  Exercise Activities and Dietary  recommendations Goals    . Bipolar - goal reduce racing thoughts at night      [place] (see longitudinal plan of care for additional care plan information)  Current Barriers:  . Polypharmacy; complex patient with multiple comorbidities including diabetes, hypothyroidism . Could not afford optimal medication [place]   Pharmacist Clinical Goal(s):  Marland Kitchen Over the next 90 days, patient will work with PharmD and provider towards optimized medication management  Interventions: . Comprehensive medication review performed; medication list updated in electronic medical record . Inter-disciplinary care team collaboration (see longitudinal plan of care) . Corrections to medication list . Counseling patient on the role of each  medication  Patient Self Care Activities:  . Patient will take medications as prescribed . Patient will focus on improved adherence by continuing current approach  Initial goal documentation     . Chronic Pain - goal simplify regimen      CARE PLAN ENTRY (see longitudinal plan of care for additional care plan information)  Current Barriers:  . Polypharmacy; complex patient with multiple comorbidities including bipolar, hypothyroidism . Multiple medications for the same indication   Pharmacist Clinical Goal(s):  Marland Kitchen Over the next 90 days, patient will work with PharmD and provider towards optimized medication management  Interventions: . Comprehensive medication review performed; medication list updated in electronic medical record . Inter-disciplinary care team collaboration (see longitudinal plan of care) . Contact pain management to verify Neurontin, Lyrica, Cymbalta, Topamax together  Patient Self Care Activities:  . Patient will take medications as prescribed . Patient will focus on improved adherence by reviewing pain management regimen with Dr. Toy Care  Initial goal documentation     . Diabetes Type 2 - goal lifestyle control     CARE PLAN ENTRY (see longitudinal plan of care for additional care plan information)  Current Barriers:  . Diabetes: type 2; complicated by chronic medical conditions including hypertension, chronic pain .  Lab Results  Component Value Date   HGBA1C 6.0 (H) 10/05/2019 .   Lab Results  Component Value Date   CREATININE 1.04 (H) 10/05/2019   CREATININE 1.36 (H) 01/07/2019   CREATININE 1.35 (H) 01/05/2019   . Kidney Function . Lab Results .  Component . Value . Date/Time .   Marland Kitchen CREATININE . 1.04 (H) . 10/05/2019 03:28 PM .   . CREATININE . 1.36 (H) . 01/07/2019 04:13 AM .   . CREATININE . 0.88 . 05/12/2014 07:47 PM .   . CREATININE . 0.79 . 04/22/2014 03:03 PM .   . CREATININE . 1.03 . 10/21/2013 08:28 PM .   . GFRNONAA . 63 . 10/05/2019  03:28 PM .   . GFRNONAA . >60 . 10/21/2013 08:28 PM .   . GFRAA . 72 . 10/05/2019 03:28 PM .   . GFRAA . >60 . 10/21/2013 08:28 PM .    . Current antihyperglycemic regimen: None . Denies hypoglycemic symptoms, including dizziness, lightheadedness, shaking, sweating . Denies hyperglycemic symptoms, including polyuria, polydipsia, polyphagia, nocturia, blurred vision, neuropathy . Current exercise: not discussed  Pharmacist Clinical Goal(s):  Marland Kitchen Over the next 90 days, patient will work with PharmD and primary care provider to address lifestyle only diabetes management  Interventions: . Comprehensive medication review performed, medication list updated in electronic medical record . Inter-disciplinary care team collaboration (see longitudinal plan of care)  Patient Self Care Activities:  . Patient will check blood glucose daily , document, and provide at future appointments to ensure not medications are necessary . Patient  will contact provider with any episodes of hypoglycemia . Patient will report any questions or concerns to provider   Initial goal documentation     . Hyperlipidemia - goal LDL < 100     CARE PLAN ENTRY (see longitudinal plan of care for additional care plan information)  Current Barriers:  . Uncontrolled hyperlipidemia, complicated by diabetes, hypertension, chronic pain . Current antihyperlipidemic regimen: Lipitor 40m daily . Previous antihyperlipidemic medications tried NA . Most recent lipid panel:     Component Value Date/Time   CHOL 202 (H) 04/11/2017 0702   TRIG 149 04/11/2017 0702   HDL 33 (L) 04/11/2017 0702   CHOLHDL 6.1 04/11/2017 0702   VLDL 30 04/11/2017 0702   LDLCALC 139 (H) 04/11/2017 0702 .    Pharmacist Clinical Goal(s):  .Marland KitchenOver the next 30 days, patient will work with PharmD and providers towards optimized antihyperlipidemic therapy  Interventions: . Comprehensive medication review performed; medication list updated in electronic  medical record.  .[facility] team collaboration (see longitudinal plan of care) . Increase atorvastatin 267m1 tab by mouth daily . Patient tolerating Lipitor increase well  Patient Self Care Activities:  . Patient will focus on medication adherence by filling and taking new  strength of cholesterol medication  Initial goal documentation     . Prevent falls     Recommend to remove any items from the home that may cause slips or trips.       Immunization History  Administered Date(s) Administered  . Influenza,inj,Quad PF,6+ Mos 05/09/2017, 05/21/2018  . Pneumococcal Polysaccharide-23 07/21/2018    Health Maintenance  Topic Date Due  . COVID-19 Vaccine (1) Never done  . FOOT EXAM  07/22/2019  . URINE MICROALBUMIN  07/22/2019  . MAMMOGRAM  07/27/2019  . OPHTHALMOLOGY EXAM  01/28/2020  . TETANUS/TDAP  11/09/2020 (Originally 07/26/1988)  . INFLUENZA VACCINE  02/28/2020  . HEMOGLOBIN A1C  04/06/2020  . COLONOSCOPY  12/10/2020  . PAP SMEAR-Modifier  07/07/2022  . PNEUMOCOCCAL POLYSACCHARIDE VACCINE AGE 65-64 HIGH RISK  Completed  . Hepatitis C Screening  Completed  . HIV Screening  Completed    Discussed health benefits of physical activity, and encouraged her to engage in regular exercise appropriate for her age and condition.  1. Annual physical exam Normal physical exam today. Will check labs as below and f/u pending lab results. If labs are stable and WNL she will not need to have these rechecked for one year at her next annual physical exam. She is to call the office in the meantime if she has any acute issue, questions or concerns. - CBC w/Diff/Platelet - Comprehensive Metabolic Panel (CMET) - T4 AND TSH - Lipid Panel With LDL/HDL Ratio - HgB A1c - Vitamin D (25 hydroxy)  2. Essential hypertension Stable. Will check labs as below and f/u pending results. - CBC w/Diff/Platelet - Comprehensive Metabolic Panel (CMET) - T4 AND TSH - Lipid Panel With  LDL/HDL Ratio - HgB A1c - Vitamin D (25 hydroxy)  3. Type 2 diabetes mellitus with microalbuminuria, without long-term current use of insulin (HCGoldstreamHas been well controlled. Last A1c was 6.0. Will check labs as below and f/u pending results. - CBC w/Diff/Platelet - Comprehensive Metabolic Panel (CMET) - T4 AND TSH - Lipid Panel With LDL/HDL Ratio - HgB A1c - Vitamin D (25 hydroxy) - POCT UA - Microalbumin  4. Traumatic brain injury with loss of consciousness, subsequent encounter Stable. Followed by psychiatry. Will check labs as below and f/u pending results. -  CBC w/Diff/Platelet - Comprehensive Metabolic Panel (CMET) - T4 AND TSH - Lipid Panel With LDL/HDL Ratio - HgB A1c - Vitamin D (25 hydroxy)  5. Benign hypertensive kidney disease with chronic kidney disease stage I through stage IV, or unspecified Stable. Followed annually by Nephrology, Sr. Candiss Norse. Will check labs as below and f/u pending results. - CBC w/Diff/Platelet - Comprehensive Metabolic Panel (CMET) - T4 AND TSH - Lipid Panel With LDL/HDL Ratio - HgB A1c - Vitamin D (25 hydroxy)  6. Stage 3a chronic kidney disease See above medical treatment plan. - CBC w/Diff/Platelet - Comprehensive Metabolic Panel (CMET) - T4 AND TSH - Lipid Panel With LDL/HDL Ratio - HgB A1c - Vitamin D (25 hydroxy)  7. Bipolar I disorder, current or most recent episode depressed, with psychotic features ([place]) Stable. Followed by Psychiatry. Will check labs as below and f/u pending results. - CBC w/Diff/Platelet - Comprehensive Metabolic Panel (CMET) - T4 AND TSH - Lipid Panel With LDL/HDL Ratio - HgB A1c - Vitamin D (25 hydroxy)  8. Class 3 severe obesity due to excess calories with serious comorbidity and body mass index (BMI) of 40.0 to 44.9 in adult [facility]) Counseled patient on healthy lifestyle modifications including dieting and exercise.  - CBC w/Diff/Platelet - Comprehensive Metabolic Panel (CMET) - T4 AND TSH - Lipid  Panel With LDL/HDL Ratio - HgB A1c - Vitamin D (25 hydroxy)  9. Vitamin D deficiency Will check labs as below and f/u pending results. - Vitamin D (25 hydroxy)   No follow-ups on file.     Reynolds Bowl, PA-C, have reviewed all documentation for this visit. The documentation on 02/04/20 for the exam, diagnosis, procedures, and orders are all accurate and complete.   Rubye Beach  [facility] [phone] (phone) [phone] (fax)  [place]

## 2020-02-05 LAB — CBC WITH DIFFERENTIAL/PLATELET
Basophils Absolute: 0.1 10*3/uL (ref 0.0–0.2)
Basos: 0 %
EOS (ABSOLUTE): 0.2 10*3/uL (ref 0.0–0.4)
Eos: 2 %
Hematocrit: 36.6 % (ref 34.0–46.6)
Hemoglobin: 12.3 g/dL (ref 11.1–15.9)
Immature Grans (Abs): 0 10*3/uL (ref 0.0–0.1)
Immature Granulocytes: 0 %
Lymphocytes Absolute: 1.9 10*3/uL (ref 0.7–3.1)
Lymphs: 17 %
MCH: 29.6 pg (ref 26.6–33.0)
MCHC: 33.6 g/dL (ref 31.5–35.7)
MCV: 88 fL (ref 79–97)
Monocytes Absolute: 0.6 10*3/uL (ref 0.1–0.9)
Monocytes: 6 %
Neutrophils Absolute: 8.4 10*3/uL — ABNORMAL HIGH (ref 1.4–7.0)
Neutrophils: 75 %
Platelets: 290 10*3/uL (ref 150–450)
RBC: 4.16 x10E6/uL (ref 3.77–5.28)
RDW: 13.5 % (ref 11.7–15.4)
WBC: 11.2 10*3/uL — ABNORMAL HIGH (ref 3.4–10.8)

## 2020-02-05 LAB — HEMOGLOBIN A1C
Est. average glucose Bld gHb Est-mCnc: 123 mg/dL
Hgb A1c MFr Bld: 5.9 % — ABNORMAL HIGH (ref 4.8–5.6)

## 2020-02-05 LAB — COMPREHENSIVE METABOLIC PANEL
ALT: 22 IU/L (ref 0–32)
AST: 22 IU/L (ref 0–40)
Albumin/Globulin Ratio: 1.9 (ref 1.2–2.2)
Albumin: 3.8 g/dL (ref 3.8–4.8)
Alkaline Phosphatase: 98 IU/L (ref 48–121)
BUN/Creatinine Ratio: 14 (ref 9–23)
BUN: 14 mg/dL (ref 6–24)
Bilirubin Total: 0.2 mg/dL (ref 0.0–1.2)
CO2: 23 mmol/L (ref 20–29)
Calcium: 8.3 mg/dL — ABNORMAL LOW (ref 8.7–10.2)
Chloride: 106 mmol/L (ref 96–106)
Creatinine, Ser: 1 mg/dL (ref 0.57–1.00)
GFR calc Af Amer: 76 mL/min/{1.73_m2} (ref >59)
GFR calc non Af Amer: 66 mL/min/{1.73_m2} (ref >59)
Globulin, Total: 2 g/dL (ref 1.5–4.5)
Glucose: 100 mg/dL — ABNORMAL HIGH (ref 65–99)
Potassium: 3.7 mmol/L (ref 3.5–5.2)
Sodium: 141 mmol/L (ref 134–144)
Total Protein: 5.8 g/dL — ABNORMAL LOW (ref 6.0–8.5)

## 2020-02-05 LAB — LIPID PANEL WITH LDL/HDL RATIO
Cholesterol, Total: 157 mg/dL (ref 100–199)
HDL: 55 mg/dL (ref >39)
LDL Chol Calc (NIH): 81 mg/dL (ref 0–99)
LDL/HDL Ratio: 1.5 ratio (ref 0.0–3.2)
Triglycerides: 117 mg/dL (ref 0–149)
VLDL Cholesterol Cal: 21 mg/dL (ref 5–40)

## 2020-02-05 LAB — VITAMIN D 25 HYDROXY (VIT D DEFICIENCY, FRACTURES): Vit D, 25-Hydroxy: 28.8 ng/mL — ABNORMAL LOW (ref 30.0–100.0)

## 2020-02-05 LAB — T4 AND TSH
T4, Total: 5.5 ug/dL (ref 4.5–12.0)
TSH: 2.47 u[IU]/mL (ref 0.450–4.500)

## 2020-02-08 ENCOUNTER — Other Ambulatory Visit: Payer: Self-pay | Admitting: Obstetrics and Gynecology

## 2020-02-08 ENCOUNTER — Telehealth: Payer: Self-pay

## 2020-02-08 DIAGNOSIS — R3989 Other symptoms and signs involving the genitourinary system: Secondary | ICD-10-CM

## 2020-02-08 MED ORDER — CEPHALEXIN 500 MG PO CAPS: 500.0000 mg | ORAL_CAPSULE | Freq: Two times a day (BID) | ORAL | 0 refills | Status: DC

## 2020-02-08 NOTE — Telephone Encounter (Signed)
Patient advised as directed below. 

## 2020-02-08 NOTE — Telephone Encounter (Signed)
Pt returned call. States has had urinary frequency x 2 weeks, "Tingling" when voids, temp 98.8. No hematuria, no back or flank pain. Pt states "Gave specimen at office visit." Also questioning if thyroid med can be increased; reports increased fatigue and "Brittle nails." Assured pt NT would route to practice for PCP's review.  Please advise.

## 2020-02-08 NOTE — Telephone Encounter (Signed)
-----   Message from Mar Daring, PA-C sent at 02/08/2020  7:45 AM EDT ----- Barbara Anderson blood cell count is borderline high which could be due to stress or an infection. Are you having any urinary symptoms or upper respiratory symptoms? Would recommend to recheck CBC w/diff in 2 weeks. Kidney and liver function are normal. Sodium, potassium are normal. Calcium is borderline low. Can take calcium supplement of 600 mg daily. Thyroid is normal. Cholesterol is normal. A1c is improved to 5.9 from 6.0. Vit D is borderline low. Recommend to take OTC Vit D 2000 IU daily.

## 2020-02-08 NOTE — Telephone Encounter (Signed)
LMTCB-if patient calls back ok for [facility] nurse to give results °

## 2020-02-08 NOTE — Telephone Encounter (Signed)
Urine was only tested for microalbumin.  Will treat prophylactically for UTI with Keflex since she is having symptoms and had elevated WBC count.   Thyroid is normal. No need for increase.

## 2020-02-09 ENCOUNTER — Other Ambulatory Visit: Payer: Self-pay | Admitting: Physician Assistant

## 2020-02-09 DIAGNOSIS — G894 Chronic pain syndrome: Secondary | ICD-10-CM

## 2020-02-09 NOTE — Telephone Encounter (Signed)
[facility] faxed refill request for the following medications:  pregabalin (LYRICA) 200 MG capsule   Please advise.

## 2020-02-10 MED ORDER — PREGABALIN 200 MG PO CAPS: 200.0000 mg | ORAL_CAPSULE | Freq: Two times a day (BID) | ORAL | 1 refills | Status: DC

## 2020-02-15 ENCOUNTER — Other Ambulatory Visit: Payer: Self-pay | Admitting: Physician Assistant

## 2020-02-15 DIAGNOSIS — E782 Mixed hyperlipidemia: Secondary | ICD-10-CM

## 2020-02-15 MED ORDER — ATORVASTATIN CALCIUM 20 MG PO TABS: 20.0000 mg | ORAL_TABLET | Freq: Every day | ORAL | 3 refills | Status: DC

## 2020-02-15 NOTE — Telephone Encounter (Signed)
[facility] faxed refill request for the following medications:  atorvastatin (LIPITOR) 20 MG tablet  Please advise. 

## 2020-03-02 DIAGNOSIS — M7918 Myalgia, other site: Secondary | ICD-10-CM | POA: Diagnosis not present

## 2020-03-02 DIAGNOSIS — M5416 Radiculopathy, lumbar region: Secondary | ICD-10-CM | POA: Diagnosis not present

## 2020-03-11 ENCOUNTER — Other Ambulatory Visit: Payer: Self-pay | Admitting: Physician Assistant

## 2020-03-11 DIAGNOSIS — Z1231 Encounter for screening mammogram for malignant neoplasm of breast: Secondary | ICD-10-CM

## 2020-03-18 DIAGNOSIS — M5416 Radiculopathy, lumbar region: Secondary | ICD-10-CM | POA: Diagnosis not present

## 2020-03-18 DIAGNOSIS — M5417 Radiculopathy, lumbosacral region: Secondary | ICD-10-CM | POA: Diagnosis not present

## 2020-03-18 DIAGNOSIS — M7918 Myalgia, other site: Secondary | ICD-10-CM | POA: Diagnosis not present

## 2020-03-24 ENCOUNTER — Other Ambulatory Visit: Payer: Self-pay

## 2020-03-24 ENCOUNTER — Ambulatory Visit
Admission: RE | Admit: 2020-03-24 | Discharge: 2020-03-24 | Disposition: A | Discharge status: Home / Self Care | Payer: Medicare HMO | Source: Ambulatory Visit | Attending: Physician Assistant | Admitting: Physician Assistant

## 2020-03-24 ENCOUNTER — Other Ambulatory Visit: Payer: Self-pay | Admitting: Physician Assistant

## 2020-03-24 DIAGNOSIS — Z1231 Encounter for screening mammogram for malignant neoplasm of breast: Secondary | ICD-10-CM

## 2020-03-26 ENCOUNTER — Other Ambulatory Visit: Payer: Self-pay | Admitting: Physician Assistant

## 2020-03-26 DIAGNOSIS — J0140 Acute pansinusitis, unspecified: Secondary | ICD-10-CM

## 2020-03-26 NOTE — Telephone Encounter (Signed)
Requested  medications are  due for refill today yes  Requested medications are on the active medication list yes  Last refill 6/16  Last visit July  Notes to clinic Ordered for acute condition, not sure if is to be continued, please assess.

## 2020-03-28 ENCOUNTER — Telehealth: Payer: Self-pay

## 2020-03-28 NOTE — Telephone Encounter (Signed)
Left message to call back. OK for [facility] to give results.

## 2020-03-28 NOTE — Telephone Encounter (Signed)
-----   Message from Mar Daring, [place] sent at 03/28/2020 10:55 AM EDT ----- Normal mammogram. Repeat screening in one year.

## 2020-03-28 NOTE — Telephone Encounter (Signed)
Patient called, left VM to return the call to the office for results.  

## 2020-03-29 NOTE — Telephone Encounter (Addendum)
Patient called, left VM to return the call to the office for results.  Juluis Mire, CMA  03/28/2020 11:22 AM EDT     Advised   Mar Daring, PA-C  03/28/2020 10:55 AM EDT     Normal mammogram. Repeat screening in one year.

## 2020-04-22 ENCOUNTER — Telehealth: Payer: Self-pay

## 2020-05-16 ENCOUNTER — Telehealth: Payer: Medicare HMO

## 2020-05-18 ENCOUNTER — Telehealth: Payer: Self-pay | Admitting: *Deleted

## 2020-05-18 NOTE — Chronic Care Management (AMB) (Signed)
  Care Management   Note  05/18/2020 Name: Barbara Anderson MRN: [id number] DOB: [birth year]  Barbara Anderson is a 49 y.o. year old female who is a primary care patient of Rubye Beach and is actively engaged with the care management team. I reached out to [name] by phone today to assist with re-scheduling a follow up visit with the Pharmacist  Follow up plan: Unsuccessful telephone outreach attempt made. A HIPAA compliant phone message was left for the patient providing contact information and requesting a return call.  The care management team will reach out to the patient again over the next 7 days.  If patient returns call to provider office, please advise to call [place] at [phone].  [facility]

## 2020-05-19 ENCOUNTER — Telehealth: Payer: Self-pay | Admitting: Physician Assistant

## 2020-05-19 DIAGNOSIS — L700 Acne vulgaris: Secondary | ICD-10-CM

## 2020-05-19 NOTE — Telephone Encounter (Signed)
Copied from [place] [id number]. Topic: Referral - Request for Referral >> May 19, 2020  2:01 PM Leward Quan A wrote: Has patient seen PCP for this complaint? No.   *If NO, is insurance requiring patient see PCP for this issue before PCP can refer them? Referral for which specialty: Dermatologist  Preferred provider/office: Any one in [facility]  Reason for referral: Adult acne on face and chest area

## 2020-05-20 NOTE — Telephone Encounter (Signed)
Referral placed.

## 2020-05-23 ENCOUNTER — Ambulatory Visit (INDEPENDENT_AMBULATORY_CARE_PROVIDER_SITE_OTHER): Payer: Medicare Other | Admitting: Obstetrics and Gynecology

## 2020-05-23 ENCOUNTER — Other Ambulatory Visit: Payer: Self-pay

## 2020-05-23 ENCOUNTER — Encounter: Payer: Self-pay | Admitting: Obstetrics and Gynecology

## 2020-05-23 VITALS — BP 140/78 | Ht 67.0 in | Wt 273.6 lb

## 2020-05-23 DIAGNOSIS — N760 Acute vaginitis: Secondary | ICD-10-CM

## 2020-05-23 MED ORDER — FLUCONAZOLE 150 MG PO TABS: 150.0000 mg | ORAL_TABLET | ORAL | 0 refills | Status: AC

## 2020-05-23 NOTE — Progress Notes (Signed)
Patient ID: Barbara Anderson, female   DOB: 1971/06/12, 49 y.o.   MRN: [id number]  Reason for Consult: Gynecologic Exam   Referred by Florian Buff*  Subjective:     HPI:  Barbara Anderson is a 49 y.o. female.  She presents today with complaints of vulvar itching for 1 week.  She reports that 4 months ago she developed a yeast infection after taking antibiotics and had similar symptoms.  She has not tried any ointments or lotions or over-the-counter medications yet.   Past Medical History:  Diagnosis Date  . ADD (attention deficit disorder)   . Anxiety   . Back pain   . Cholecystitis, acute 01/06/2019  . Concussion 2001   head trauma-work related. Caused headaches and short term memory loss.  . Depression   . Diabetes mellitus without complication ([place])   . Overactive bladder   . Recurrent vaginitis    Family History  Problem Relation Age of Onset  . Diabetes Mother   . Hypertension Mother   . Depression Mother   . Diabetes Father   . Hypertension Father   . Anxiety disorder Father   . Depression Father   . ADD / ADHD Father   . Depression Sister   . Bipolar disorder Sister   . ADD / ADHD Brother    Past Surgical History:  Procedure Laterality Date  . ankle pin and plates placement     right  . AUGMENTATION MAMMAPLASTY    . CHOLECYSTECTOMY N/A 01/06/2019   Procedure: LAPAROSCOPIC CHOLECYSTECTOMY;  Surgeon: Vickie Epley, MD;  Location: [facility] ORS;  Service: General;  Laterality: N/A;  . COLONOSCOPY WITH PROPOFOL N/A 12/11/2019   Procedure: COLONOSCOPY WITH PROPOFOL;  Surgeon: Lin Landsman, MD;  Location: [facility] ENDOSCOPY;  Service: Gastroenterology;  Laterality: N/A;  . COSMETIC SURGERY  2005   BREASTS, ARM, ABDOMEN AFTER WEIGHT LOSS    Short Social History:  Social History   Tobacco Use  . Smoking status: Never Smoker  . Smokeless tobacco: Never Used  Substance Use Topics  . Alcohol use: No    Alcohol/week: 0.0 standard drinks    Allergies    Allergen Reactions  . Metformin And Related Diarrhea    Current Outpatient Medications  Medication Sig Dispense Refill  . Alcohol Swabs (B-D SINGLE USE SWABS REGULAR) PADS USE TO CLEANSE SKIN EVERY DAY BEFORE CHECKING BLOOD SUGAR 100 each 1  . ALPRAZolam (XANAX) 1 MG tablet Take 1 mg by mouth 2 (two) times daily as needed.     Marland Kitchen amphetamine-dextroamphetamine (ADDERALL) 20 MG tablet Take 1 tablet (20 mg total) by mouth in the morning, at noon, and at bedtime. Has not started yet 11/10/19    . ARIPiprazole (ABILIFY) 5 MG tablet Take 5 mg by mouth daily.    Marland Kitchen atorvastatin (LIPITOR) 20 MG tablet Take 1 tablet (20 mg total) by mouth daily. 90 tablet 3  . Cholecalciferol (VITAMIN D3) 25 MCG (1000 UT) CAPS Take 1 capsule (1,000 Units total) by mouth daily. 60 capsule   . DULoxetine (CYMBALTA) 60 MG capsule Take 2 capsules (120 mg total) by mouth daily. 60 capsule 0  . fluticasone (FLONASE) 50 MCG/ACT nasal spray USE 2 SPRAYS IN EACH NOSTRIL EVERY DAY 48 g 1  . folic acid (QC FOLIC ACID) 315 MCG tablet Take 1 tablet (800 mcg total) by mouth daily.    . Lancets Misc. (ACCU-CHEK FASTCLIX LANCET) KIT To check blood sugar once daily 1 kit 0  . levothyroxine (SYNTHROID)  25 MCG tablet TAKE 1 TABLET EVERY DAY BEFORE BREAKFAST 90 tablet 3  . pregabalin (LYRICA) 200 MG capsule Take 1 capsule (200 mg total) by mouth 2 (two) times daily. 180 capsule 1  . QUEtiapine Fumarate (SEROQUEL XR) 150 MG 24 hr tablet Take 150 mg by mouth at bedtime.     . topiramate (TOPAMAX) 200 MG tablet Take 1 tablet (200 mg total) by mouth at bedtime. 30 tablet 1  . cephALEXin (KEFLEX) 500 MG capsule Take 1 capsule (500 mg total) by mouth 2 (two) times daily. (Patient not taking: Reported on 05/23/2020) 10 capsule 0  . fluconazole (DIFLUCAN) 150 MG tablet Take 1 tablet (150 mg total) by mouth every 3 (three) days for 2 doses. 2 tablet 0  . traZODone (DESYREL) 150 MG tablet Take 1 tablet (150 mg total) by mouth at bedtime. (Patient not  taking: Reported on 01/13/2020) 30 tablet 1   No current facility-administered medications for this visit.    Review of Systems  Constitutional: Negative for chills, fatigue, fever and unexpected weight change.  HENT: Negative for trouble swallowing.  Eyes: Negative for loss of vision.  Respiratory: Negative for cough, shortness of breath and wheezing.  Cardiovascular: Negative for chest pain, leg swelling, palpitations and syncope.  GI: Negative for abdominal pain, blood in stool, diarrhea, nausea and vomiting.  GU: Negative for difficulty urinating, dysuria, frequency and hematuria.  Musculoskeletal: Negative for back pain, leg pain and joint pain.  Skin: Negative for rash.  Neurological: Negative for dizziness, headaches, light-headedness, numbness and seizures.  Psychiatric: Negative for behavioral problem, confusion, depressed mood and sleep disturbance.        Objective:  Objective   Vitals:   05/23/20 1444  BP: 140/78  Weight: 273 lb 9.6 oz (124.1 kg)  Height: '5\' 7"'  (1.702 m)   Body mass index is 42.85 kg/m.  Physical Exam Vitals and nursing note reviewed.  Constitutional:      Appearance: She is well-developed.  HENT:     Head: Normocephalic and atraumatic.  Eyes:     Pupils: Pupils are equal, round, and reactive to light.  Cardiovascular:     Rate and Rhythm: Normal rate and regular rhythm.  Pulmonary:     Effort: Pulmonary effort is normal. No respiratory distress.  Skin:    General: Skin is warm and dry.  Neurological:     Mental Status: She is alert and oriented to person, place, and time.  Psychiatric:        Behavior: Behavior normal.        Thought Content: Thought content normal.        Judgment: Judgment normal.   PELVIC:  External: Normal appearing vulva. No lesions noted.  Speculum examination: Normal appearing cervix. No blood in the vaginal vault.  Frothy discharge discharge.      Assessment/Plan:     49 year old with acute  vaginitis. Will send nuswab.  Patient desires proactive treatment with Diflucan.  Prescription sent to the pharmacy.  If needed will treat with alternative regimen based on results.  More than 10 minutes were spent face to face with the patient in the room, reviewing the medical record, labs and images, and coordinating care for the patient. The plan of management was discussed in detail and counseling was provided.      Adrian Prows MD [facility], [facility] 05/23/2020 3:48 PM

## 2020-05-23 NOTE — Progress Notes (Signed)
Pt states she has a vaginal itch x 1 week. Pt states ita more on the outside of the vagina.

## 2020-05-23 NOTE — Patient Instructions (Signed)
Vaginitis Vaginitis is a condition in which the vaginal tissue swells and becomes red (inflamed). This condition is most often caused by a change in the normal balance of bacteria and yeast that live in the vagina. This change causes an overgrowth of certain bacteria or yeast, which causes the inflammation. There are different types of vaginitis, but the most common types are:  Bacterial vaginosis.  Yeast infection (candidiasis).  Trichomoniasis vaginitis. This is a sexually transmitted disease (STD).  Viral vaginitis.  Atrophic vaginitis.  Allergic vaginitis. What are the causes? The cause of this condition depends on the type of vaginitis. It can be caused by:  Bacteria (bacterial vaginosis).  Yeast, which is a fungus (yeast infection).  A parasite (trichomoniasis vaginitis).  A virus (viral vaginitis).  Low hormone levels (atrophic vaginitis). Low hormone levels can occur during pregnancy, breastfeeding, or after menopause.  Irritants, such as bubble baths, scented tampons, and feminine sprays (allergic vaginitis). Other factors can change the normal balance of the yeast and bacteria that live in the vagina. These include:  Antibiotic medicines.  Poor hygiene.  Diaphragms, vaginal sponges, spermicides, birth control pills, and intrauterine devices (IUD).  Sex.  Infection.  Uncontrolled diabetes.  A weakened defense (immune) system. What increases the risk? This condition is more likely to develop in women who:  Smoke.  Use vaginal douches, scented tampons, or scented sanitary pads.  Wear tight-fitting pants.  Wear thong underwear.  Use oral birth control pills or an IUD.  Have sex without a condom.  Have multiple sex partners.  Have an STD.  Frequently use the spermicide nonoxynol-9.  Eat lots of foods high in sugar.  Have uncontrolled diabetes.  Have low estrogen levels.  Have a weakened immune system from an immune disorder or medical  treatment.  Are pregnant or breastfeeding. What are the signs or symptoms? Symptoms vary depending on the cause of the vaginitis. Common symptoms include:  Abnormal vaginal discharge. ? The discharge is white, gray, or yellow with bacterial vaginosis. ? The discharge is thick, white, and cheesy with a yeast infection. ? The discharge is frothy and yellow or greenish with trichomoniasis.  A bad vaginal smell. The smell is fishy with bacterial vaginosis.  Vaginal itching, pain, or swelling.  Sex that is painful.  Pain or burning when urinating. Sometimes there are no symptoms. How is this diagnosed? This condition is diagnosed based on your symptoms and medical history. A physical exam, including a pelvic exam, will also be done. You may also have other tests, including:  Tests to determine the pH level (acidity or alkalinity) of your vagina.  A whiff test, to assess the odor that results when a sample of your vaginal discharge is mixed with a potassium hydroxide solution.  Tests of vaginal fluid. A sample will be examined under a microscope. How is this treated? Treatment varies depending on the type of vaginitis you have. Your treatment may include:  Antibiotic creams or pills to treat bacterial vaginosis and trichomoniasis.  Antifungal medicines, such as vaginal creams or suppositories, to treat a yeast infection.  Medicine to ease discomfort if you have viral vaginitis. Your sexual partner should also be treated.  Estrogen delivered in a cream, pill, suppository, or vaginal ring to treat atrophic vaginitis. If vaginal dryness occurs, lubricants and moisturizing creams may help. You may need to avoid scented soaps, sprays, or douches.  Stopping use of a product that is causing allergic vaginitis. Then using a vaginal cream to treat the symptoms. Follow   these instructions at home: Lifestyle  Keep your genital area clean and dry. Avoid soap, and only rinse the area with  water.  Do not douche or use tampons until your health care provider says it is okay to do so. Use sanitary pads, if needed.  Do not have sex until your health care provider approves. When you can return to sex, practice safe sex and use condoms.  Wipe from front to back. This avoids the spread of bacteria from the rectum to the vagina. General instructions  Take over-the-counter and prescription medicines only as told by your health care provider.  If you were prescribed an antibiotic medicine, take or use it as told by your health care provider. Do not stop taking or using the antibiotic even if you start to feel better.  Keep all follow-up visits as told by your health care provider. This is important. How is this prevented?  Use mild, non-scented products. Do not use things that can irritate the vagina, such as fabric softeners. Avoid the following products if they are scented: ? Feminine sprays. ? Detergents. ? Tampons. ? Feminine hygiene products. ? Soaps or bubble baths.  Let air reach your genital area. ? Wear cotton underwear to reduce moisture buildup. ? Avoid wearing underwear while you sleep. ? Avoid wearing tight pants and underwear or nylons without a cotton panel. ? Avoid wearing thong underwear.  Take off any wet clothing, such as bathing suits, as soon as possible.  Practice safe sex and use condoms. Contact a health care provider if:  You have abdominal pain.  You have a fever.  You have symptoms that last for more than 2-3 days. Get help right away if:  You have a fever and your symptoms suddenly get worse. Summary  Vaginitis is a condition in which the vaginal tissue becomes inflamed.This condition is most often caused by a change in the normal balance of bacteria and yeast that live in the vagina.  Treatment varies depending on the type of vaginitis you have.  Do not douche, use tampons , or have sex until your health care provider approves. When  you can return to sex, practice safe sex and use condoms. This information is not intended to replace advice given to you by your health care provider. Make sure you discuss any questions you have with your health care provider. Document Revised: 06/28/2017 Document Reviewed: 08/21/2016 Elsevier Patient Education  2020 [employer]  

## 2020-05-26 LAB — NUSWAB VAGINITIS PLUS (VG+)
Candida albicans, NAA: NEGATIVE
Candida glabrata, NAA: NEGATIVE
Chlamydia trachomatis, NAA: NEGATIVE
Neisseria gonorrhoeae, NAA: NEGATIVE
Trich vag by NAA: NEGATIVE

## 2020-07-07 NOTE — Chronic Care Management (AMB) (Signed)
  Care Management   Note  07/07/2020 Name: Barbara Anderson MRN: [id number] DOB: 1971/05/01  [name] is a 50 y.o. year old female who is a primary care patient of Rubye Beach and is actively engaged with the care management team. I reached out to [name] by phone today to assist with re-scheduling a follow up visit with the Pharmacist.  Follow up plan: Telephone appointment with care management team member scheduled for: 08/09/2020  [facility]  Direct Dial: [phone]

## 2020-07-12 ENCOUNTER — Ambulatory Visit (INDEPENDENT_AMBULATORY_CARE_PROVIDER_SITE_OTHER): Payer: Medicare Other | Admitting: Obstetrics and Gynecology

## 2020-07-12 ENCOUNTER — Other Ambulatory Visit: Payer: Self-pay

## 2020-07-12 ENCOUNTER — Encounter: Payer: Self-pay | Admitting: Obstetrics and Gynecology

## 2020-07-12 VITALS — Ht 67.0 in | Wt 270.8 lb

## 2020-07-12 DIAGNOSIS — Z01419 Encounter for gynecological examination (general) (routine) without abnormal findings: Secondary | ICD-10-CM

## 2020-07-12 DIAGNOSIS — Z0000 Encounter for general adult medical examination without abnormal findings: Secondary | ICD-10-CM

## 2020-07-12 DIAGNOSIS — Z1239 Encounter for other screening for malignant neoplasm of breast: Secondary | ICD-10-CM

## 2020-07-12 DIAGNOSIS — Z1231 Encounter for screening mammogram for malignant neoplasm of breast: Secondary | ICD-10-CM

## 2020-07-12 DIAGNOSIS — Z124 Encounter for screening for malignant neoplasm of cervix: Secondary | ICD-10-CM | POA: Diagnosis not present

## 2020-07-12 DIAGNOSIS — N760 Acute vaginitis: Secondary | ICD-10-CM

## 2020-07-12 NOTE — Progress Notes (Signed)
Annual exam

## 2020-07-12 NOTE — Progress Notes (Signed)
Gynecology Annual Exam  PCP: Mar Daring, PA-C  Chief Complaint: No chief complaint on file.   History of Present Illness: Patient is a 49 y.o. G3P0030 presents for annual exam. The patient has no complaints today.   LMP: Patient's last menstrual period was 06/29/2020. Average Interval: regular, monthly  Denies menorrhagia Periods last 2 days generally.  No pain with periods.   The patient is not currently sexually active. She denies dyspareunia.  Postcoital Bleeding: no  The patient does perform self breast exams.  There is no notable family history of breast or ovarian cancer in her family.  She reports that she has been having vulvar irritation which occurs after her period. She has had over 2 weeks of this discomfort. She reports a vaginal discharge which looks like clumpy egg whites. She reports that she does wash her bottom with soap regularly.    Review of Systems: Review of Systems  Constitutional: Negative for chills, fever, malaise/fatigue and weight loss.  HENT: Negative for congestion, hearing loss and sinus pain.   Eyes: Negative for blurred vision and double vision.  Respiratory: Negative for cough, sputum production, shortness of breath and wheezing.   Cardiovascular: Negative for chest pain, palpitations, orthopnea and leg swelling.  Gastrointestinal: Negative for abdominal pain, constipation, diarrhea, nausea and vomiting.  Genitourinary: Negative for dysuria, flank pain, frequency, hematuria and urgency.  Musculoskeletal: Negative for back pain, falls and joint pain.  Skin: Negative for itching and rash.  Neurological: Negative for dizziness and headaches.  Psychiatric/Behavioral: Negative for depression, substance abuse and suicidal ideas. The patient is not nervous/anxious.     Past Medical History:  Past Medical History:  Diagnosis Date  . ADD (attention deficit disorder)   . Anxiety   . Back pain   . Cholecystitis, acute 01/06/2019  .  Concussion 2001   head trauma-work related. Caused headaches and short term memory loss.  . Depression   . Diabetes mellitus without complication ([place])   . Overactive bladder   . Recurrent vaginitis     Past Surgical History:  Past Surgical History:  Procedure Laterality Date  . ankle pin and plates placement     right  . AUGMENTATION MAMMAPLASTY    . CHOLECYSTECTOMY N/A 01/06/2019   Procedure: LAPAROSCOPIC CHOLECYSTECTOMY;  Surgeon: Vickie Epley, MD;  Location: [facility] ORS;  Service: General;  Laterality: N/A;  . COLONOSCOPY WITH PROPOFOL N/A 12/11/2019   Procedure: COLONOSCOPY WITH PROPOFOL;  Surgeon: Lin Landsman, MD;  Location: [facility] ENDOSCOPY;  Service: Gastroenterology;  Laterality: N/A;  . COSMETIC SURGERY  2005   BREASTS, ARM, ABDOMEN AFTER WEIGHT LOSS    Gynecologic History:  Patient's last menstrual period was 06/29/2020. Last Pap: Results were: 06/2019 NIL HPV negative Last mammogram: 2021  Results were: BI-RAD I  Obstetric History: G3P0030  Family History:  Family History  Problem Relation Age of Onset  . Diabetes Mother   . Hypertension Mother   . Depression Mother   . Diabetes Father   . Hypertension Father   . Anxiety disorder Father   . Depression Father   . ADD / ADHD Father   . Depression Sister   . Bipolar disorder Sister   . ADD / ADHD Brother     Social History:  Social History   Socioeconomic History  . Marital status: Divorced    Spouse name: Not on file  . Number of children: 0  . Years of education: Not on file  . Highest education level:  Bachelor's degree (e.g., BA, AB, BS)  Occupational History  . Occupation: disability  Tobacco Use  . Smoking status: Never Smoker  . Smokeless tobacco: Never Used  Vaping Use  . Vaping Use: Never used  Substance and Sexual Activity  . Alcohol use: No    Alcohol/week: 0.0 standard drinks  . Drug use: No  . Sexual activity: Yes    Birth control/protection: None  Other Topics Concern  .  Not on file  Social History Narrative  . Not on file   Social Determinants of Health   Financial Resource Strain: High Risk  . Difficulty of Paying Living Expenses: Hard  Food Insecurity: Food Insecurity Present  . Worried About Charity fundraiser in the Last Year: Often true  . Ran Out of Food in the Last Year: Often true  Transportation Needs: No Transportation Needs  . Lack of Transportation (Medical): No  . Lack of Transportation (Non-Medical): No  Physical Activity: Inactive  . Days of Exercise per Week: 0 days  . Minutes of Exercise per Session: 0 min  Stress: Stress Concern Present  . Feeling of Stress : Very much  Social Connections: Socially Isolated  . Frequency of Communication with Friends and Family: Once a week  . Frequency of Social Gatherings with Friends and Family: Once a week  . Attends Religious Services: Never  . Active Member of Clubs or Organizations: No  . Attends Archivist Meetings: Never  . Marital Status: Divorced  Human resources officer Violence: Not At Risk  . Fear of Current or Ex-Partner: No  . Emotionally Abused: No  . Physically Abused: No  . Sexually Abused: No    Allergies:  Allergies  Allergen Reactions  . Metformin And Related Diarrhea    Medications: Prior to Admission medications   Medication Sig Start Date End Date Taking? Authorizing Provider  Alcohol Swabs (B-D SINGLE USE SWABS REGULAR) PADS USE TO CLEANSE SKIN EVERY DAY BEFORE CHECKING BLOOD SUGAR 01/23/20   Mar Daring, PA-C  ALPRAZolam Duanne Moron) 1 MG tablet Take 1 mg by mouth 2 (two) times daily as needed.  06/24/19   [provider]  amphetamine-dextroamphetamine (ADDERALL) 20 MG tablet Take 1 tablet (20 mg total) by mouth in the morning, at noon, and at bedtime. Has not started yet 11/10/19 12/23/19   Mar Daring, PA-C  ARIPiprazole (ABILIFY) 5 MG tablet Take 5 mg by mouth daily. 11/17/19   [provider]  atorvastatin (LIPITOR) 20 MG  tablet Take 1 tablet (20 mg total) by mouth daily. 02/15/20   Mar Daring, PA-C  cephALEXin (KEFLEX) 500 MG capsule Take 1 capsule (500 mg total) by mouth 2 (two) times daily. Patient not taking: Reported on 05/23/2020 02/08/20   Mar Daring, PA-C  Cholecalciferol (VITAMIN D3) 25 MCG (1000 UT) CAPS Take 1 capsule (1,000 Units total) by mouth daily. 12/23/19   Mar Daring, PA-C  DULoxetine (CYMBALTA) 60 MG capsule Take 2 capsules (120 mg total) by mouth daily. 04/15/17   Pucilowska, Wardell Honour, MD  fluticasone (FLONASE) 50 MCG/ACT nasal spray USE 2 SPRAYS IN EACH NOSTRIL EVERY DAY 03/28/20   Mar Daring, PA-C  folic acid (QC FOLIC ACID) 338 MCG tablet Take 1 tablet (800 mcg total) by mouth daily. 12/23/19   Mar Daring, PA-C  Lancets Misc. (ACCU-CHEK FASTCLIX LANCET) KIT To check blood sugar once daily 07/21/18   Fenton Malling M, PA-C  levothyroxine (SYNTHROID) 25 MCG tablet TAKE 1 TABLET EVERY DAY  BEFORE BREAKFAST 01/14/20   Mar Daring, PA-C  pregabalin (LYRICA) 200 MG capsule Take 1 capsule (200 mg total) by mouth 2 (two) times daily. 02/10/20   Mar Daring, PA-C  QUEtiapine Fumarate (SEROQUEL XR) 150 MG 24 hr tablet Take 150 mg by mouth at bedtime.  06/21/19   [provider]  topiramate (TOPAMAX) 200 MG tablet Take 1 tablet (200 mg total) by mouth at bedtime. 04/15/17   Pucilowska, Herma Ard B, MD  traZODone (DESYREL) 150 MG tablet Take 1 tablet (150 mg total) by mouth at bedtime. Patient not taking: Reported on 01/13/2020 04/15/17   Clovis Fredrickson, MD    Physical Exam Vitals: There were no vitals taken for this visit.  Physical Exam Constitutional:      Appearance: She is well-developed.  Genitourinary:     Vagina and uterus normal.     There is no lesion on the right labia.     There is no lesion on the left labia.    No lesions in the vagina.     Genitourinary Comments: External: Normal appearing vulva. No lesions  noted.  Speculum examination: Normal appearing cervix. No blood in the vaginal vault. Normal white discharge.   Bimanual examination: Uterus midline, non-tender, normal in size, shape and contour.  Mild CMT. No adnexal masses. No adnexal tenderness. Pelvis not fixed.       Right Adnexa: no mass present.    Left Adnexa: no mass present.    No cervical motion tenderness.  Breasts:     Right: No inverted nipple, mass, nipple discharge or skin change.     Left: No inverted nipple, mass, nipple discharge or skin change.    HENT:     Head: Normocephalic and atraumatic.  Eyes:     Extraocular Movements: EOM normal.  Neck:     Thyroid: No thyromegaly.  Cardiovascular:     Rate and Rhythm: Normal rate and regular rhythm.     Heart sounds: Normal heart sounds.  Pulmonary:     Effort: Pulmonary effort is normal.     Breath sounds: Normal breath sounds.  Abdominal:     General: Bowel sounds are normal. There is no distension.     Palpations: Abdomen is soft. There is no mass.  Musculoskeletal:     Cervical back: Neck supple.  Neurological:     Mental Status: She is alert and oriented to person, place, and time.  Skin:    General: Skin is warm and dry.  Psychiatric:        Mood and Affect: Mood and affect normal.        Behavior: Behavior normal.        Thought Content: Thought content normal.        Judgment: Judgment normal.  Vitals reviewed.    Wet Prep: Clue Cells: Negative Fungal elements: Negative Trichomonas: Negative    Female chaperone present for pelvic and breast  portions of the physical exam  Assessment: 49 y.o. G3P0030 routine annual exam  Plan: Problem List Items Addressed This Visit   None   Visit Diagnoses    Encounter for annual routine gynecological examination    -  Primary   Health maintenance examination       Breast cancer screening by mammogram       Cervical cancer screening       Encounter for gynecological examination without abnormal finding        Encounter for screening breast examination  Acute vaginitis       Relevant Orders   NuSwab BV and Candida, NAA      1) Mammogram - recommend yearly screening mammogram.  Mammogram Is up to date  2) STI screening was recently completed and was normal  3) ASCCP guidelines and rational discussed.  Patient opts for every 5 years screening interval  4) Colonoscopy -- needs to be repeated. Advised patient to follow up with GI physician.   5) Routine healthcare maintenance including cholesterol, diabetes screening discussed managed by PCP  6) Recurrent vaginitis- advised patient regarding hygiene and not to wash vulva/vagina with soap. Wet prep today normal. Recent testing showed no vaginitis. Patient would like to resend nuswab for confirmation. Discussed hylaphem as an option to help with recurrent itching was well.    Adrian Prows MD, [facility], [facility] 07/12/2020 3:06 PM

## 2020-07-12 NOTE — Patient Instructions (Addendum)
Institute of [place] for Calcium and Vitamin D  Age (yr) Calcium Recommended Dietary Allowance (mg/day) Vitamin D Recommended Dietary Allowance (international units/day)  9-18 1,300 600  19-50 1,000 600  51-70 1,200 600  71 and older 1,200 800  Data from Institute of Medicine. Dietary reference intakes: calcium, vitamin D. [place], [place]: [employer]; 2011.    Exercising to Stay Healthy To become healthy and stay healthy, it is recommended that you do moderate-intensity and vigorous-intensity exercise. You can tell that you are exercising at a moderate intensity if your heart starts beating faster and you start breathing faster but can still hold a conversation. You can tell that you are exercising at a vigorous intensity if you are breathing much harder and faster and cannot hold a conversation while exercising. Exercising regularly is important. It has many health benefits, such as:  Improving overall fitness, flexibility, and endurance.  Increasing bone density.  Helping with weight control.  Decreasing body fat.  Increasing muscle strength.  Reducing stress and tension.  Improving overall health. How often should I exercise? Choose an activity that you enjoy, and set realistic goals. Your health care provider can help you make an activity plan that works for you. Exercise regularly as told by your health care provider. This may include:  Doing strength training two times a week, such as: ? Lifting weights. ? Using resistance bands. ? Push-ups. ? Sit-ups. ? Yoga.  Doing a certain intensity of exercise for a given amount of time. Choose from these options: ? A total of 150 minutes of moderate-intensity exercise every week. ? A total of 75 minutes of vigorous-intensity exercise every week. ? A mix of moderate-intensity and vigorous-intensity exercise every week. Children, pregnant women, people who have not exercised  regularly, people who are overweight, and older adults may need to talk with a health care provider about what activities are safe to do. If you have a medical condition, be sure to talk with your health care provider before you start a new exercise program. What are some exercise ideas? Moderate-intensity exercise ideas include:  Walking 1 mile (1.6 km) in about 15 minutes.  Biking.  Hiking.  Golfing.  Dancing.  Water aerobics. Vigorous-intensity exercise ideas include:  Walking 4.5 miles (7.2 km) or more in about 1 hour.  Jogging or running 5 miles (8 km) in about 1 hour.  Biking 10 miles (16.1 km) or more in about 1 hour.  Lap swimming.  Roller-skating or in-line skating.  Cross-country skiing.  Vigorous competitive sports, such as football, basketball, and soccer.  Jumping rope.  Aerobic dancing. What are some everyday activities that can help me to get exercise?  [place] work, such as: ? Pushing a Conservation officer, nature. ? Raking and bagging leaves.  Washing your car.  Pushing a stroller.  Shoveling snow.  Gardening.  Washing windows or floors. How can I be more active in my day-to-day activities?  Use stairs instead of an elevator.  Take a walk during your lunch break.  If you drive, park your car farther away from your work or school.  If you take public transportation, get off one stop early and walk the rest of the way.  Stand up or walk around during all of your indoor phone calls.  Get up, stretch, and walk around every 30 minutes throughout the day.  Enjoy exercise with a friend. Support to continue exercising will help you keep a regular routine of activity. What guidelines can I  follow while exercising?  Before you start a new exercise program, talk with your health care provider.  Do not exercise so much that you hurt yourself, feel dizzy, or get very short of breath.  Wear comfortable clothes and wear shoes with good support.  Drink plenty of  water while you exercise to prevent dehydration or heat stroke.  Work out until your breathing and your heartbeat get faster. Where to find more information  U.S. Department of Health and Human Services: BondedCompany.at  Centers for Disease Control and Prevention (CDC): http://www.wolf.info/ Summary  Exercising regularly is important. It will improve your overall fitness, flexibility, and endurance.  Regular exercise also will improve your overall health. It can help you control your weight, reduce stress, and improve your bone density.  Do not exercise so much that you hurt yourself, feel dizzy, or get very short of breath.  Before you start a new exercise program, talk with your health care provider. This information is not intended to replace advice given to you by your health care provider. Make sure you discuss any questions you have with your health care provider. Document Revised: 06/28/2017 Document Reviewed: 06/06/2017 Elsevier Patient Education  [place].   Budget-Friendly Healthy Eating There are many ways to save money at the grocery store and continue to eat healthy. You can be successful if you:  Plan meals according to your budget.  Make a grocery list and only purchase food according to your grocery list.  Prepare food yourself. What are tips for following this plan?  Reading food labels  Compare food labels between brand name foods and the store brand. Often the nutritional value is the same, but the store brand is lower cost.  Look for products that do not have added sugar, fat, or salt (sodium). These often cost the same but are healthier for you. Products may be labeled as: ? Sugar-free. ? Nonfat. ? Low-fat. ? Sodium-free. ? Low-sodium.  Look for lean ground beef labeled as at least 92% lean and 8% fat. Shopping  Buy only the items on your grocery list and go only to the areas of the store that have the items on your list.  Use coupons only for foods  and brands you normally buy. Avoid buying items you wouldn't normally buy simply because they are on sale.  Check online and in newspapers for weekly deals.  Buy healthy items from the bulk bins when available, such as herbs, spices, flour, pasta, nuts, and dried fruit.  Buy fruits and vegetables that are in season. Prices are usually lower on in-season produce.  Look at the unit price on the price tag. Use it to compare different brands and sizes to find out which item is the best deal.  Choose healthy items that are often low-cost, such as carrots, potatoes, apples, bananas, and oranges. Dried or canned beans are a low-cost protein source.  Buy in bulk and freeze extra food. Items you can buy in bulk include meats, fish, poultry, frozen fruits, and frozen vegetables.  Avoid buying "ready-to-eat" foods, such as pre-cut fruits and vegetables and pre-made salads.  If possible, shop around to discover where you can find the best prices. Consider other retailers such as dollar stores, larger [employer], local fruit and vegetable stands, and farmers markets.  Do not shop when you are hungry. If you shop while hungry, it may be hard to stick to your list and budget.  Resist impulse buying. Use your grocery list as your  official plan for the week.  Buy a variety of vegetables and fruits by purchasing fresh, frozen, and canned items.  Look at the top and bottom shelves for deals. Foods at eye level (eye level of an adult or child) are usually more expensive.  Be efficient with your time when shopping. The more time you spend at the store, the more money you are likely to spend.  To save money when choosing more expensive foods like meats and dairy: ? Choose cheaper cuts of meat, such as bone-in chicken thighs and drumsticks instead of skinless and boneless chicken. When you are ready to prepare the chicken, you can remove the skin yourself to make it healthier. ? Choose lean meats like  chicken or [place] instead of beef. ? Choose canned seafood, such as tuna, salmon, or sardines. ? Buy eggs as a low-cost source of protein. ? Buy dried beans and peas, such as lentils, split peas, or kidney beans instead of meats. Dried beans and peas are a good alternative source of protein. ? Buy the larger tubs of yogurt instead of individual-sized containers.  Choose water instead of sodas and other sweetened beverages.  Avoid buying chips, cookies, and other "junk food." These items are usually expensive and not healthy. Cooking  Make extra food and freeze the extras in meal-sized containers or in individual portions for fast meals and snacks.  Pre-cook on days when you have extra time to prepare meals in advance. You can keep these meals in the fridge or freezer and reheat for a quick meal.  When you come home from the grocery store, wash, peel, and cut fruits and vegetables so they are ready to use and eat. This will help reduce food waste. Meal planning  Do not eat out or get fast food. Prepare food at home.  Make a grocery list and make sure to bring it with you to the store. If you have a smart phone, you could use your phone to create your shopping list.  Plan meals and snacks according to a grocery list and budget you create.  Use leftovers in your meal plan for the week.  Look for recipes where you can cook once and make enough food for two meals.  Include budget-friendly meals like stews, casseroles, and stir-fry dishes.  Try some meatless meals or try "no cook" meals like salads.  Make sure that half your plate is filled with fruits or vegetables. Choose from fresh, frozen, or canned fruits and vegetables. If eating canned, remember to rinse them before eating. This will remove any excess salt added for packaging. Summary  Eating healthy on a budget is possible if you plan your meals according to your budget, purchase according to your budget and grocery list, and  prepare food yourself.  Tips for buying more food on a limited budget include buying generic brands, using coupons only for foods you normally buy, and buying healthy items from the bulk bins when available.  Tips for buying cheaper food to replace expensive food include choosing cheaper, lean cuts of meat, and buying dried beans and peas. This information is not intended to replace advice given to you by your health care provider. Make sure you discuss any questions you have with your health care provider. Document Revised: 07/17/2017 Document Reviewed: 07/17/2017 Elsevier Patient Education  2020 [place] protect organs, store calcium, anchor muscles, and support the whole body. Keeping your bones strong is important, especially as you get  older. You can take actions to help keep your bones strong and healthy. Why is keeping my bones healthy important?  Keeping your bones healthy is important because your body constantly replaces bone cells. Cells get old, and new cells take their place. As we age, we lose bone cells because the body may not be able to make enough new cells to replace the old cells. The amount of bone cells and bone tissue you have is referred to as bone mass. The higher your bone mass, the stronger your bones. The aging process leads to an overall loss of bone mass in the body, which can increase the likelihood of:  Joint pain and stiffness.  Broken bones.  A condition in which the bones become weak and brittle (osteoporosis). A large decline in bone mass occurs in older adults. In women, it occurs about the time of menopause. What actions can I take to keep my bones healthy? Good health habits are important for maintaining healthy bones. This includes eating nutritious foods and exercising regularly. To have healthy bones, you need to get enough of the right minerals and vitamins. Most nutrition experts recommend getting these nutrients from the  foods that you eat. In some cases, taking supplements may also be recommended. Doing certain types of exercise is also important for bone health. What are the nutritional recommendations for healthy bones?  Eating a well-balanced diet with plenty of calcium and vitamin D will help to protect your bones. Nutritional recommendations vary from person to person. Ask your health care provider what is healthy for you. Here are some general guidelines. Get enough calcium Calcium is the most important (essential) mineral for bone health. Most people can get enough calcium from their diet, but supplements may be recommended for people who are at risk for osteoporosis. Good sources of calcium include:  Dairy products, such as low-fat or nonfat milk, cheese, and yogurt.  Dark green leafy vegetables, such as bok choy and broccoli.  Calcium-fortified foods, such as orange juice, cereal, bread, soy beverages, and tofu products.  Nuts, such as almonds. Follow these recommended amounts for daily calcium intake:  Children, age 77-3: 700 mg.  Children, age 35-8: 1,000 mg.  Children, age 84-13: 1,300 mg.  Teens, age 79-18: 1,300 mg.  Adults, age 64-50: 1,000 mg.  Adults, age 62-70: ? Men: 1,000 mg. ? Women: 1,200 mg.  Adults, age 50 or older: 1,200 mg.  Pregnant and breastfeeding females: ? Teens: 1,300 mg. ? Adults: 1,000 mg. Get enough vitamin D Vitamin D is the most essential vitamin for bone health. It helps the body absorb calcium. Sunlight stimulates the skin to make vitamin D, so be sure to get enough sunlight. If you live in a cold climate or you do not get outside often, your health care provider may recommend that you take vitamin D supplements. Good sources of vitamin D in your diet include:  Egg yolks.  Saltwater fish.  Milk and cereal fortified with vitamin D. Follow these recommended amounts for daily vitamin D intake:  Children and teens, age 77-18: 600 international  units.  Adults, age 27 or younger: 400-800 international units.  Adults, age 51 or older: 800-1,000 international units. Get other important nutrients Other nutrients that are important for bone health include:  Phosphorus. This mineral is found in meat, poultry, dairy foods, nuts, and legumes. The recommended daily intake for adult men and adult women is 700 mg.  Magnesium. This mineral is found in seeds, nuts, dark green  vegetables, and legumes. The recommended daily intake for adult men is 400-420 mg. For adult women, it is 310-320 mg.  Vitamin K. This vitamin is found in green leafy vegetables. The recommended daily intake is 120 mg for adult men and 90 mg for adult women. What type of physical activity is best for building and maintaining healthy bones? Weight-bearing and strength-building activities are important for building and maintaining healthy bones. Weight-bearing activities cause muscles and bones to work against gravity. Strength-building activities increase the strength of the muscles that support bones. Weight-bearing and muscle-building activities include:  Walking and hiking.  Jogging and running.  Dancing.  Gym exercises.  Lifting weights.  Tennis and racquetball.  Climbing stairs.  Aerobics. Adults should get at least 30 minutes of moderate physical activity on most days. Children should get at least 60 minutes of moderate physical activity on most days. Ask your health care provider what type of exercise is best for you. How can I find out if my bone mass is low? Bone mass can be measured with an X-ray test called a bone mineral density (BMD) test. This test is recommended for all women who are age 3 or older. It may also be recommended for:  Men who are age 41 or older.  People who are at risk for osteoporosis because of: ? Having bones that break easily. ? Having a long-term disease that weakens bones, such as kidney disease or rheumatoid  arthritis. ? Having menopause earlier than normal. ? Taking medicine that weakens bones, such as steroids, thyroid hormones, or hormone treatment for breast cancer or prostate cancer. ? Smoking. ? Drinking three or more alcoholic drinks a day. If you find that you have a low bone mass, you may be able to prevent osteoporosis or further bone loss by changing your diet and lifestyle. Where can I find more information? For more information, check out the following websites:  [place]: AviationTales.fr  [employer] of Health: www.bones.SouthExposed.es  International Osteoporosis Foundation: Administrator.iofbonehealth.org Summary  The aging process leads to an overall loss of bone mass in the body, which can increase the likelihood of broken bones and osteoporosis.  Eating a well-balanced diet with plenty of calcium and vitamin D will help to protect your bones.  Weight-bearing and strength-building activities are also important for building and maintaining strong bones.  Bone mass can be measured with an X-ray test called a bone mineral density (BMD) test. This information is not intended to replace advice given to you by your health care provider. Make sure you discuss any questions you have with your health care provider. Document Revised: 08/12/2017 Document Reviewed: 08/12/2017 Elsevier Patient Education  [place] vulva clean, dry, and well aerated  0 Avoid sleeper pajamas. Nightgowns allow air to circulate.  0 Cotton underpants. Double-rinse underwear after washing to avoid residual irritants. Do not use fabric softeners for underwear and swimsuits.  0 Avoid tights, leotards, and leggings. Skirts and loose-fitting pants allow air to circulate.  0 Avoid sitting in wet swimsuits for long periods of time.  Daily warm bathing   0 Do not use bubble baths or perfumed soaps.  0 Soak in clean water (no soap) for 10 to 15 minutes.  0 Use soap to wash  regions other than the genital area just before getting out of the tub. Limit use of any soap on genital areas.  0 Rinse the genital area well and gently pat dry.  0 A hair dryer on  the cool setting may be helpful to assist with drying the genital region.  0 If the vulvar area is tender or swollen, cool compresses may relieve the discomfort. Emollients may help protect skin.  Review toilet hygiene  0 Urination  1 Sit with knees apart to reduce reflux of urine into the vagina.    0 Emphasize wiping front to back after bowel movements.  0 Wet wipes can be used instead of toilet paper for wiping as long as they don't cause a "stinging" sensation.

## 2020-07-14 ENCOUNTER — Telehealth: Payer: Self-pay

## 2020-07-14 LAB — NUSWAB BV AND CANDIDA, NAA
Candida albicans, NAA: NEGATIVE
Candida glabrata, NAA: NEGATIVE

## 2020-07-14 NOTE — Telephone Encounter (Signed)
Pt dropped off certification of disability form and request for Tawanna Sat to complete. Copy is on file with med recs and form placed in Jenni's box. Pt request call when form is ready for pick up. CB#[phone]. Thanks TNP

## 2020-07-18 NOTE — Telephone Encounter (Signed)
Form completed.

## 2020-07-18 NOTE — Telephone Encounter (Signed)
Called patient and reported she is going to stop by to pick form up today.

## 2020-08-08 ENCOUNTER — Telehealth: Payer: Self-pay

## 2020-08-08 NOTE — Chronic Care Management (AMB) (Signed)
08/08/2020- Called patient to remind of appointment with Junius Argyle, CPP on 08/09/2020 at 1:00 pm. Patient aware that she will receive a telephone call for her visit.   Pattricia Boss, [place] Pharmacist Assistant [phone]

## 2020-08-09 ENCOUNTER — Encounter: Payer: Self-pay | Admitting: Physician Assistant

## 2020-08-09 ENCOUNTER — Ambulatory Visit: Payer: Medicare HMO

## 2020-08-09 DIAGNOSIS — E039 Hypothyroidism, unspecified: Secondary | ICD-10-CM | POA: Insufficient documentation

## 2020-08-09 DIAGNOSIS — E782 Mixed hyperlipidemia: Secondary | ICD-10-CM

## 2020-08-09 DIAGNOSIS — E034 Atrophy of thyroid (acquired): Secondary | ICD-10-CM

## 2020-08-09 NOTE — Patient Instructions (Signed)
Visit Information It was great speaking with you today!  Please let me know if you have any questions about our visit. Goals Addressed            This Visit's Progress   . Chronic Care Management       CARE PLAN ENTRY (see longitudinal plan of care for additional care plan information)  Current Barriers:  . Chronic Disease Management support, education, and care coordination needs related to Hypertension, Hyperlipidemia, Diabetes, Chronic Kidney Disease, Hypothyroidism, Depression, and Depression, Bipolar Disorder, Chronic Pain, PTSD, OCD Disorder   Hypertension BP Readings from Last 3 Encounters:  05/23/20 140/78  02/04/20 132/86  01/15/20 130/90   . Pharmacist Clinical Goal(s): o Over the next 90 days, patient will work with PharmD and providers to maintain BP goal <140/90 . Current regimen:  o None . Interventions: o Discussed low salt diet and exercising as tolerated extensively o Will initiate blood pressure monitoring plan  . Patient self care activities - Over the next 90 days, patient will: o Check blood pressure if symptomatic, document, and provide at future appointments o Ensure daily salt intake < 2300 mg/day  Hyperlipidemia Lab Results  Component Value Date/Time   LDLCALC 81 02/04/2020 02:58 PM   . Pharmacist Clinical Goal(s): o Over the next 90 days, patient will work with PharmD and providers to maintain LDL goal < 100 . Current regimen:  o Atorvastatin 20 mg daily . Interventions: o Discussed low cholesterol diet and exercising as tolerated extensively o Will initiate cholesterol monitoring plan   Diabetes Lab Results  Component Value Date/Time   HGBA1C 5.9 (H) 02/04/2020 02:58 PM   HGBA1C 6.0 (H) 10/05/2019 03:28 PM   . Pharmacist Clinical Goal(s): o Over the next 90 days, patient will work with PharmD and providers to maintain A1c goal <6.5% . Current regimen:  o None . Interventions: o Discussed carbohydrate counting and exercising as  tolerated extensively o Will initiate blood sugar monitoring plan  . Patient self care activities - Over the next 90 days, patient will: o Contact provider with any episodes of hypoglycemia  Medication management . Pharmacist Clinical Goal(s): o Over the next 90 days, patient will work with PharmD and providers to maintain optimal medication adherence . Current pharmacy: [facility] . Interventions o Comprehensive medication review performed. o Continue current medication management strategy . Patient self care activities - Over the next 90 days, patient will: o Take medications as prescribed o Report any questions or concerns to PharmD and/or provider(s)      The patient verbalized understanding of instructions, educational materials, and care plan provided today and declined offer to receive copy of patient instructions, educational materials, and care plan.   Telephone follow up appointment with pharmacy team member scheduled for: 02/06/21 at 1:00 PM  [place] [phone]

## 2020-08-09 NOTE — Chronic Care Management (AMB) (Signed)
Chronic Care Management Pharmacy  Name: Barbara Anderson  MRN: [id number] DOB: [birth year]  Chief Complaint/ HPI  Barbara Anderson,  50 y.o. , female presents for their Follow-Up CCM visit with the clinical pharmacist via telephone.  PCP : Mar Daring, PA-C  Their chronic conditions include: Hypertension, Hyperlipidemia, Diabetes, Chronic Kidney Disease, Hypothyroidism, Depression, and Depression, Bipolar Disorder, Chronic Pain, PTSD, and OCD Disorder  Office Visits: 02/04/20: Patient presented to Fenton Malling, PA-C for follow-up. VitD low, recommend Vit D 2000 units daily   Consult Visit: 07/12/20: Patient presented to Dr. Gilman Schmidt (OBGYN) for follow-up.  06/02/20: Patient presented to Dr. Grandville Silos for Pain Management follow-up.  05/14/20: Patient presented to Dr. Juleen China (Nephrology) for follow-up.  6/18 Vaginosis, [place], BP 130/90 Wt 257 BMI 40.3, Diflucan 155m x 1, Flagyl, Cipro 6/16 abdominal pain, [place], BP 137/81 P 112,   Medications: Outpatient Encounter Medications as of 08/09/2020  Medication Sig  . Alcohol Swabs (B-D SINGLE USE SWABS REGULAR) PADS USE TO CLEANSE SKIN EVERY DAY BEFORE CHECKING BLOOD SUGAR  . ALPRAZolam (XANAX) 1 MG tablet Take 1 mg by mouth 2 (two) times daily as needed.   .Marland Kitchenamphetamine-dextroamphetamine (ADDERALL) 20 MG tablet Take 1 tablet (20 mg total) by mouth in the morning, at noon, and at bedtime. Has not started yet 11/10/19  . ARIPiprazole (ABILIFY) 5 MG tablet Take 5 mg by mouth daily.  .Marland Kitchenatorvastatin (LIPITOR) 20 MG tablet Take 1 tablet (20 mg total) by mouth daily.  . cephALEXin (KEFLEX) 500 MG capsule Take 1 capsule (500 mg total) by mouth 2 (two) times daily. (Patient not taking: Reported on 05/23/2020)  . Cholecalciferol (VITAMIN D3) 25 MCG (1000 UT) CAPS Take 1 capsule (1,000 Units total) by mouth daily.  . DULoxetine (CYMBALTA) 60 MG capsule Take 2 capsules (120 mg total) by mouth daily.  . fluticasone (FLONASE) 50 MCG/ACT nasal spray  USE 2 SPRAYS IN EACH NOSTRIL EVERY DAY  . folic acid (FOLVITE) 8425MCG tablet Take 1 tablet (800 mcg total) by mouth daily.  . Lancets Misc. (ACCU-CHEK FASTCLIX LANCET) KIT To check blood sugar once daily  . levothyroxine (SYNTHROID) 25 MCG tablet TAKE 1 TABLET EVERY DAY BEFORE BREAKFAST  . pregabalin (LYRICA) 200 MG capsule Take 1 capsule (200 mg total) by mouth 2 (two) times daily.  . QUEtiapine Fumarate (SEROQUEL XR) 150 MG 24 hr tablet Take 150 mg by mouth at bedtime.   . topiramate (TOPAMAX) 200 MG tablet Take 1 tablet (200 mg total) by mouth at bedtime.  . traZODone (DESYREL) 150 MG tablet Take 1 tablet (150 mg total) by mouth at bedtime.   No facility-administered encounter medications on file as of 08/09/2020.    Financial Resource Strain: High Risk  . Difficulty of Paying Living Expenses: Hard   Current Diagnosis/Assessment:  Goals Addressed            This Visit's Progress   . Chronic Care Management        Hypertension   BP goal is:  <140/90  Office blood pressures are  BP Readings from Last 3 Encounters:  05/23/20 140/78  02/04/20 132/86  01/15/20 130/90   Patient checks BP at home infrequently Patient home BP readings are ranging: NA  Patient has failed these meds in the past: NA Patient is currently controlled on the following medications:  . None  We discussed diet and exercise extensively  Plan  Continue control with diet and exercise   Hyperlipidemia   LDL goal < 100  Lipid Panel     Component Value Date/Time   CHOL 157 02/04/2020 1458   TRIG 117 02/04/2020 1458   HDL 55 02/04/2020 1458   LDLCALC 81 02/04/2020 1458    Hepatic Function Latest Ref Rng & Units 02/04/2020 10/05/2019 01/07/2019  Total Protein 6.0 - 8.5 g/dL 5.8(L) 6.9 6.4(L)  Albumin 3.8 - 4.8 g/dL 3.8 4.2 3.1(L)  AST 0 - 40 IU/L 22 18 35  ALT 0 - 32 IU/L 22 18 35  Alk Phosphatase 48 - 121 IU/L 98 155(H) 90  Total Bilirubin 0.0 - 1.2 mg/dL 0.2 0.3 0.4  Bilirubin, Direct 0.0 -  0.2 mg/dL - - -     The 10-year ASCVD risk score Mikey Bussing DC Jr., et al., 2013) is: 2.8%   Values used to calculate the score:     Age: 13 years     Sex: Female     Is Non-Hispanic African American: No     Diabetic: Yes     Tobacco smoker: No     Systolic Blood Pressure: 384 mmHg     Is BP treated: No     HDL Cholesterol: 55 mg/dL     Total Cholesterol: 157 mg/dL   Patient has failed these meds in past: NA Patient is currently controlled on the following medications:  . Atorvastatin 48m daily  We discussed:   How tolerating Lipitor increase? No problems Results don't reflect impact of Lipitor increase  Plan  Continue current medications  Diabetes   A1c goal <6.5%  Recent Relevant Labs: Lab Results  Component Value Date/Time   HGBA1C 5.9 (H) 02/04/2020 02:58 PM   HGBA1C 6.0 (H) 10/05/2019 03:28 PM   MICROALBUR 20 02/04/2020 02:46 PM   MICROALBUR 50 07/21/2018 04:34 PM    Last diabetic Eye exam: No results found for: HMDIABEYEEXA  Last diabetic Foot exam: No results found for: HMDIABFOOTEX   Checking BG: Never  Recent FBG Readings: NA Recent pre-meal BG readings: NA Recent 2hr PP BG readings:  NA Recent HS BG readings: NA  Patient has failed these meds in past: NA Patient is currently controlled on the following medications: . None  We discussed: diet and exercise extensively  Plan  Continue control with diet and exercise  Hypothyroidism   Lab Results  Component Value Date/Time   TSH 2.470 02/04/2020 02:58 PM   TSH 1.840 01/01/2019 02:57 PM   FREET4 1.00 09/26/2018 03:10 PM   FREET4 0.78 (L) 08/16/2018 01:48 AM    Patient has failed these meds in past: NA Patient is currently controlled on the following medications:  . Levothyroxine 25 mcg daily   We discussed: Patient endorses symptoms of low energy, hair loss, and cold sensitivity that has remained stable since starting levothyroxine. She is uncertain if her symptoms are due to her thyroid or her  underlying depression/ mood disorder.   Plan  Continue current medications  Recommend rechecking TSH and T4 levels to ensure euthyroid   Depression / Anxiety   Managed by Dr. KToy Care  PHQ9 Score:  PHQ9 SCORE ONLY 08/09/2020 07/08/2019 11/28/2018  PHQ-9 Total Score '12 7 13   ' GAD7 Score: GAD 7 : Generalized Anxiety Score 07/08/2019  Nervous, Anxious, on Edge 0  Control/stop worrying 0  Worry too much - different things 1  Trouble relaxing 1  Restless 0  Easily annoyed or irritable 1  Afraid - awful might happen 0  Total GAD 7 Score 3  Anxiety Difficulty Somewhat difficult    Patient has  failed these meds in past: NA Patient is currently uncontrolled on the following medications:  . Adderall 20 mg TID  . Alprazolam 1 mg twice daily as needed  . Aripiprazole 5 mg daily  . Duloxetine 60 mg 2 capsules daily  . Seroquel XR 150 mg QHS  . Topiramate 200 mg QHS  We discussed: Patient continues to struggle with her mood. She has her next follow-up with Dr. Toy Care in a few weeks. She states Cymalta and Adderall have had the biggest impact on her mood.   Discussed potential benefits of behavioral counseling, patient given contact information for [facility].   Plan  Continue current medications  Misc / OTC    . Flonase 50 mcg 2 spray daily  . Folic acid 536 mcg daily . Pregabalin 200 mg twice daily  . Trazodone 150 mg QHS . Vitamin D3 1000 units daily    Plan  Continue current medications  Medication Management   Pt uses [facility] for all medications Uses pill box? Yes Pt endorses 100% compliance  We discussed:   Plan  Continue current medication management strategy  Follow up: 6 month phone visit  [place] [phone]

## 2020-08-23 DIAGNOSIS — G894 Chronic pain syndrome: Secondary | ICD-10-CM | POA: Diagnosis not present

## 2020-08-23 DIAGNOSIS — M47816 Spondylosis without myelopathy or radiculopathy, lumbar region: Secondary | ICD-10-CM | POA: Diagnosis not present

## 2020-08-25 ENCOUNTER — Telehealth: Payer: Self-pay

## 2020-08-25 NOTE — Progress Notes (Signed)
Chronic Care Management Pharmacy Assistant   Name: Barbara Anderson  MRN: [id number] DOB: [birth year]  Reason for Encounter: Medication Review   PCP : Mar Daring, PA-C  Allergies:   Allergies  Allergen Reactions  . Metformin And Related Diarrhea    Medications: Outpatient Encounter Medications as of 08/25/2020  Medication Sig  . Alcohol Swabs (B-D SINGLE USE SWABS REGULAR) PADS USE TO CLEANSE SKIN EVERY DAY BEFORE CHECKING BLOOD SUGAR  . ALPRAZolam (XANAX) 1 MG tablet Take 1 mg by mouth 2 (two) times daily as needed.   Marland Kitchen amphetamine-dextroamphetamine (ADDERALL) 20 MG tablet Take 1 tablet (20 mg total) by mouth in the morning, at noon, and at bedtime. Has not started yet 11/10/19  . ARIPiprazole (ABILIFY) 5 MG tablet Take 5 mg by mouth daily.  Marland Kitchen atorvastatin (LIPITOR) 20 MG tablet Take 1 tablet (20 mg total) by mouth daily.  . cephALEXin (KEFLEX) 500 MG capsule Take 1 capsule (500 mg total) by mouth 2 (two) times daily. (Patient not taking: Reported on 05/23/2020)  . Cholecalciferol (VITAMIN D3) 25 MCG (1000 UT) CAPS Take 1 capsule (1,000 Units total) by mouth daily.  . DULoxetine (CYMBALTA) 60 MG capsule Take 2 capsules (120 mg total) by mouth daily.  . fluticasone (FLONASE) 50 MCG/ACT nasal spray USE 2 SPRAYS IN EACH NOSTRIL EVERY DAY  . folic acid (FOLVITE) 585 MCG tablet Take 1 tablet (800 mcg total) by mouth daily.  . Lancets Misc. (ACCU-CHEK FASTCLIX LANCET) KIT To check blood sugar once daily  . levothyroxine (SYNTHROID) 25 MCG tablet TAKE 1 TABLET EVERY DAY BEFORE BREAKFAST  . pregabalin (LYRICA) 200 MG capsule Take 1 capsule (200 mg total) by mouth 2 (two) times daily.  . QUEtiapine Fumarate (SEROQUEL XR) 150 MG 24 hr tablet Take 150 mg by mouth at bedtime.   . topiramate (TOPAMAX) 200 MG tablet Take 1 tablet (200 mg total) by mouth at bedtime.  . traZODone (DESYREL) 150 MG tablet Take 1 tablet (150 mg total) by mouth at bedtime.   No facility-administered  encounter medications on file as of 08/25/2020.    Current Diagnosis: Patient Active Problem List   Diagnosis Date Noted  . Hypothyroid 08/09/2020  . Encounter for screening colonoscopy   . Anemia in chronic kidney disease 06/15/2019  . Benign hypertensive kidney disease with chronic kidney disease 06/15/2019  . Hypokalemia 06/15/2019  . Proteinuria 06/15/2019  . Stage 3a chronic kidney disease ([place]) 06/15/2019  . Edema of upper extremity 01/04/2019  . Lymphedema 09/03/2018  . Type 2 diabetes mellitus with microalbuminuria, without long-term current use of insulin ([place]) 07/21/2018  . Suicidal ideation 04/11/2017  . HTN (hypertension) 04/11/2017  . Chronic pain syndrome 04/11/2017  . PTSD (post-traumatic stress disorder) 04/11/2017  . OCD (obsessive compulsive disorder) 04/11/2017  . Panic disorder with agoraphobia 04/11/2017  . TBI (traumatic brain injury) ([place]) 04/11/2017  . Bipolar I disorder, current or most recent episode depressed, with psychotic features ([place]) 04/10/2017  . Difficulty concentrating 11/24/2015  . Abnormal bruising 10/26/2015  . Fibrositis 10/26/2015  . Cervical pain 10/26/2015  . Class 3 severe obesity due to excess calories with serious comorbidity and body mass index (BMI) of 40.0 to 44.9 in adult ([place]) 10/26/2015  . Disorder of labyrinth 10/26/2015  . Oligomenorrhea 08/30/2015  . Weight gain 08/30/2015  . Costochondritis 04/14/2015  . Dyspnea 04/14/2015  . Recurrent vaginitis   . Clinical depression 09/21/2008  . Late effect of certain complications of trauma 27/78/2423  . Headache, variant  migraine, intractable 04/16/2006    Goals Addressed   None    Reviewed chart and adherence measures. Per insurance data patient is 70-79 % non adherent to Atorvastatin for cholesterol  . Patient does  not meet goal for atorvastatin, will notify Junius Argyle, Clinical pharmacist.    Follow-Up:  Pharmacist Review   [facility] [place] Clinical Pharmacist  Assistant [phone]

## 2020-09-16 DIAGNOSIS — M4726 Other spondylosis with radiculopathy, lumbar region: Secondary | ICD-10-CM | POA: Diagnosis not present

## 2020-09-16 DIAGNOSIS — M47816 Spondylosis without myelopathy or radiculopathy, lumbar region: Secondary | ICD-10-CM | POA: Diagnosis not present

## 2020-09-22 ENCOUNTER — Other Ambulatory Visit: Payer: Self-pay

## 2020-09-22 ENCOUNTER — Ambulatory Visit (INDEPENDENT_AMBULATORY_CARE_PROVIDER_SITE_OTHER): Payer: Medicare HMO | Admitting: Obstetrics and Gynecology

## 2020-09-22 ENCOUNTER — Encounter: Payer: Self-pay | Admitting: Obstetrics and Gynecology

## 2020-09-22 VITALS — BP 120/70 | Ht 67.0 in | Wt 253.0 lb

## 2020-09-22 DIAGNOSIS — N3000 Acute cystitis without hematuria: Secondary | ICD-10-CM | POA: Diagnosis not present

## 2020-09-22 DIAGNOSIS — N898 Other specified noninflammatory disorders of vagina: Secondary | ICD-10-CM | POA: Diagnosis not present

## 2020-09-22 DIAGNOSIS — R1032 Left lower quadrant pain: Secondary | ICD-10-CM

## 2020-09-22 LAB — POCT URINALYSIS DIPSTICK
Bilirubin, UA: NEGATIVE
Blood, UA: NEGATIVE
Glucose, UA: NEGATIVE
Ketones, UA: NEGATIVE
Leukocytes, UA: NEGATIVE
Nitrite, UA: POSITIVE
Protein, UA: NEGATIVE
Spec Grav, UA: 1.02 (ref 1.010–1.025)
pH, UA: 6 (ref 5.0–8.0)

## 2020-09-22 LAB — POCT WET PREP WITH KOH
Clue Cells Wet Prep HPF POC: NEGATIVE
KOH Prep POC: NEGATIVE
Trichomonas, UA: NEGATIVE
Yeast Wet Prep HPF POC: NEGATIVE

## 2020-09-22 MED ORDER — NITROFURANTOIN MONOHYD MACRO 100 MG PO CAPS: 100.0000 mg | ORAL_CAPSULE | Freq: Two times a day (BID) | ORAL | 0 refills | Status: AC

## 2020-09-22 NOTE — Progress Notes (Signed)
Mar Daring, PA-C   Chief Complaint  Patient presents with  . Pelvic Pain    Left side only, frequency urinating, no burning x 1 week  . Vaginal Discharge    Sour odor, no itchiness x 1 week    HPI:      [name] is a 50 y.o. G3P0030 whose LMP was Patient's last menstrual period was 08/22/2020 (approximate)., presents today for increased vag d/c with odor, no irritation for past wk. Also with urinary urgency with decreased flow and LLQ constant, crampy pain, improved with tylenol. No dysuria, LBP, fevers. No GI sx. No prior abx use, no new soaps/detergents. Had had neg vaginal cultures for yeast/BV with Dr. Gilman Schmidt 10/21 and 12/21. Hx of BV in past but feels different.  She is sex active, no new partners. Menses monthly but late this month.   Past Medical History:  Diagnosis Date  . ADD (attention deficit disorder)   . Anxiety   . Back pain   . Cholecystitis, acute 01/06/2019  . Concussion 2001   head trauma-work related. Caused headaches and short term memory loss.  . Depression   . Diabetes mellitus without complication ([place])   . Overactive bladder   . Recurrent vaginitis     Past Surgical History:  Procedure Laterality Date  . ankle pin and plates placement     right  . AUGMENTATION MAMMAPLASTY    . CHOLECYSTECTOMY N/A 01/06/2019   Procedure: LAPAROSCOPIC CHOLECYSTECTOMY;  Surgeon: Vickie Epley, MD;  Location: [facility] ORS;  Service: General;  Laterality: N/A;  . COLONOSCOPY WITH PROPOFOL N/A 12/11/2019   Procedure: COLONOSCOPY WITH PROPOFOL;  Surgeon: Lin Landsman, MD;  Location: [facility] ENDOSCOPY;  Service: Gastroenterology;  Laterality: N/A;  . COSMETIC SURGERY  2005   BREASTS, ARM, ABDOMEN AFTER WEIGHT LOSS    Family History  Problem Relation Age of Onset  . Diabetes Mother   . Hypertension Mother   . Depression Mother   . Diabetes Father   . Hypertension Father   . Anxiety disorder Father   . Depression Father   . ADD / ADHD Father    . Depression Sister   . Bipolar disorder Sister   . ADD / ADHD Brother     Social History   Socioeconomic History  . Marital status: Divorced    Spouse name: Not on file  . Number of children: 0  . Years of education: Not on file  . Highest education level: Bachelor's degree (e.g., BA, AB, BS)  Occupational History  . Occupation: disability  Tobacco Use  . Smoking status: Never Smoker  . Smokeless tobacco: Never Used  Vaping Use  . Vaping Use: Never used  Substance and Sexual Activity  . Alcohol use: No    Alcohol/week: 0.0 standard drinks  . Drug use: No  . Sexual activity: Yes    Birth control/protection: None  Other Topics Concern  . Not on file  Social History Narrative  . Not on file   Social Determinants of Health   Financial Resource Strain: Low Risk   . Difficulty of Paying Living Expenses: Not hard at all  Food Insecurity: Food Insecurity Present  . Worried About Charity fundraiser in the Last Year: Often true  . Ran Out of Food in the Last Year: Often true  Transportation Needs: No Transportation Needs  . Lack of Transportation (Medical): No  . Lack of Transportation (Non-Medical): No  Physical Activity: Inactive  . Days of  Exercise per Week: 0 days  . Minutes of Exercise per Session: 0 min  Stress: Stress Concern Present  . Feeling of Stress : Very much  Social Connections: Socially Isolated  . Frequency of Communication with Friends and Family: Once a week  . Frequency of Social Gatherings with Friends and Family: Once a week  . Attends Religious Services: Never  . Active Member of Clubs or Organizations: No  . Attends Archivist Meetings: Never  . Marital Status: Divorced  Human resources officer Violence: Not At Risk  . Fear of Current or Ex-Partner: No  . Emotionally Abused: No  . Physically Abused: No  . Sexually Abused: No    Outpatient Medications Prior to Visit  Medication Sig Dispense Refill  . Alcohol Swabs (B-D SINGLE USE SWABS  REGULAR) PADS USE TO CLEANSE SKIN EVERY DAY BEFORE CHECKING BLOOD SUGAR 100 each 1  . ALPRAZolam (XANAX) 1 MG tablet Take 1 mg by mouth 2 (two) times daily as needed.     Marland Kitchen amphetamine-dextroamphetamine (ADDERALL) 20 MG tablet Take 1 tablet (20 mg total) by mouth in the morning, at noon, and at bedtime. Has not started yet 11/10/19    . ARIPiprazole (ABILIFY) 5 MG tablet Take 5 mg by mouth daily.    Marland Kitchen atorvastatin (LIPITOR) 20 MG tablet Take 1 tablet (20 mg total) by mouth daily. 90 tablet 3  . Cholecalciferol (VITAMIN D3) 25 MCG (1000 UT) CAPS Take 1 capsule (1,000 Units total) by mouth daily. 60 capsule   . DULoxetine (CYMBALTA) 60 MG capsule Take 2 capsules (120 mg total) by mouth daily. 60 capsule 0  . fluticasone (FLONASE) 50 MCG/ACT nasal spray USE 2 SPRAYS IN EACH NOSTRIL EVERY DAY 48 g 1  . folic acid (FOLVITE) 696 MCG tablet Take 1 tablet (800 mcg total) by mouth daily.    . Lancets Misc. (ACCU-CHEK FASTCLIX LANCET) KIT To check blood sugar once daily 1 kit 0  . levothyroxine (SYNTHROID) 25 MCG tablet TAKE 1 TABLET EVERY DAY BEFORE BREAKFAST 90 tablet 3  . pregabalin (LYRICA) 200 MG capsule Take 1 capsule (200 mg total) by mouth 2 (two) times daily. 180 capsule 1  . QUEtiapine Fumarate (SEROQUEL XR) 150 MG 24 hr tablet Take 150 mg by mouth at bedtime.     . topiramate (TOPAMAX) 200 MG tablet Take 1 tablet (200 mg total) by mouth at bedtime. 30 tablet 1  . traZODone (DESYREL) 150 MG tablet Take 1 tablet (150 mg total) by mouth at bedtime. 30 tablet 1  . cephALEXin (KEFLEX) 500 MG capsule Take 1 capsule (500 mg total) by mouth 2 (two) times daily. (Patient not taking: Reported on 05/23/2020) 10 capsule 0   No facility-administered medications prior to visit.      ROS:  Review of Systems  Constitutional: Negative for fever, malaise/fatigue and weight loss.  Gastrointestinal: Negative for blood in stool, constipation, diarrhea, nausea and vomiting.  Genitourinary: Positive for pelvic  pain, urgency and vaginal discharge. Negative for dyspareunia, dysuria, flank pain, frequency, hematuria, vaginal bleeding and vaginal pain.  Musculoskeletal: Negative for back pain.  Skin: Negative for itching and rash.    OBJECTIVE:   Vitals:  BP 120/70   Ht '5\' 7"'  (1.702 m)   Wt 253 lb (114.8 kg)   LMP 08/22/2020 (Approximate)   BMI 39.63 kg/m   Physical Exam Vitals reviewed.  Constitutional:      Appearance: She is well-developed.  Pulmonary:     Effort: Pulmonary effort is normal.  Genitourinary:    General: Normal vulva.     Pubic Area: No rash.      Labia:        Right: No rash, tenderness or lesion.        Left: No rash, tenderness or lesion.      Vagina: Bleeding present. No vaginal discharge, erythema or tenderness.     Cervix: Normal.     Uterus: Normal. Not enlarged and not tender.      Adnexa: Right adnexa normal and left adnexa normal.       Right: No mass or tenderness.         Left: No mass or tenderness.       Comments: NO PAIN ON PELVIC EXAM Musculoskeletal:        General: Normal range of motion.     Cervical back: Normal range of motion.  Skin:    General: Skin is warm and dry.  Neurological:     General: No focal deficit present.     Mental Status: She is alert and oriented to person, place, and time.  Psychiatric:        Mood and Affect: Mood normal.        Behavior: Behavior normal.        Thought Content: Thought content normal.        Judgment: Judgment normal.     Results: Results for orders placed or performed in visit on 09/22/20 (from the past 24 hour(s))  POCT Urinalysis Dipstick     Status: Abnormal   Collection Time: 09/22/20 12:01 PM  Result Value Ref Range   Color, UA yellow    Clarity, UA clear    Glucose, UA Negative Negative   Bilirubin, UA neg    Ketones, UA neg    Spec Grav, UA 1.020 1.010 - 1.025   Blood, UA neg    pH, UA 6.0 5.0 - 8.0   Protein, UA Negative Negative   Urobilinogen, UA     Nitrite, UA pos     Leukocytes, UA Negative Negative   Appearance     Odor    POCT Wet Prep with KOH     Status: Normal   Collection Time: 09/22/20 12:01 PM  Result Value Ref Range   Trichomonas, UA Negative    Clue Cells Wet Prep HPF POC neg    Epithelial Wet Prep HPF POC     Yeast Wet Prep HPF POC neg    Bacteria Wet Prep HPF POC     RBC Wet Prep HPF POC     WBC Wet Prep HPF POC     KOH Prep POC Negative Negative     Assessment/Plan: Vaginal discharge - Plan: NuSwab Vaginitis (VG), POCT Wet Prep with KOH; neg wet prep. Check culture. If neg, most likely change due to menses starting. F/u prn.   Acute cystitis without hematuria - Plan: POCT Urinalysis Dipstick, Urine Culture, nitrofurantoin, macrocrystal-monohydrate, (MACROBID) 100 MG capsule; pos sx and UA. Rx macrobid. Check C&S. F/u prn.   LLQ pain--menses starting. Question relating to period vs UTI. Neg exam. F/u if sx persist after menses and UTI tx.    Meds ordered this encounter  Medications  . nitrofurantoin, macrocrystal-monohydrate, (MACROBID) 100 MG capsule    Sig: Take 1 capsule (100 mg total) by mouth 2 (two) times daily for 5 days.    Dispense:  10 capsule    Refill:  0    Order Specific Question:   Supervising Provider  AnswerGae Dry [[id number]]      No follow-ups on file.  Rheta Hemmelgarn B. Appollonia Klee, PA-C 09/22/2020 12:02 PM

## 2020-09-22 NOTE — Patient Instructions (Signed)
I value your feedback and you entrusting us with your care. If you get a [facility] patient survey, I would appreciate you taking the time to let us know about your experience today. Thank you! ? ? ?

## 2020-09-24 LAB — URINE CULTURE

## 2020-09-24 NOTE — Progress Notes (Signed)
Pt's C&S is neg. Pls check to make sure her sx are improved.

## 2020-09-25 LAB — NUSWAB VAGINITIS (VG)
Candida albicans, NAA: NEGATIVE
Candida glabrata, NAA: NEGATIVE
Trich vag by NAA: NEGATIVE

## 2020-09-26 NOTE — Progress Notes (Signed)
Pt's sx have improved.

## 2020-10-05 ENCOUNTER — Encounter: Payer: Self-pay | Admitting: Dermatology

## 2020-10-05 ENCOUNTER — Other Ambulatory Visit: Payer: Self-pay

## 2020-10-05 ENCOUNTER — Ambulatory Visit: Payer: Medicare Other | Admitting: Dermatology

## 2020-10-05 DIAGNOSIS — L819 Disorder of pigmentation, unspecified: Secondary | ICD-10-CM | POA: Diagnosis not present

## 2020-10-05 DIAGNOSIS — L700 Acne vulgaris: Secondary | ICD-10-CM | POA: Diagnosis not present

## 2020-10-05 MED ORDER — DOXYCYCLINE HYCLATE 100 MG PO TABS: 100.0000 mg | ORAL_TABLET | Freq: Every day | ORAL | 3 refills | Status: DC

## 2020-10-05 MED ORDER — SPIRONOLACTONE 25 MG PO TABS: 25.0000 mg | ORAL_TABLET | Freq: Every day | ORAL | 3 refills | Status: DC

## 2020-10-05 MED ORDER — ADAPALENE 0.3 % EX GEL: 1.0000 "application " | Freq: Every day | CUTANEOUS | 3 refills | Status: AC

## 2020-10-05 NOTE — Progress Notes (Signed)
   New Patient Visit  Subjective  Barbara Anderson is a 50 y.o. female who presents for the following: Acne (And dark spots of face for months - has used differin and other prescription creams in the past but it has been a long time).  Referred by Fenton Malling, PA-C  The following portions of the chart were reviewed this encounter and updated as appropriate:   Tobacco  Allergies  Meds  Problems  Med Hx  Surg Hx  Fam Hx     Review of Systems:  No other skin or systemic complaints except as noted in HPI or Assessment and Plan.  Objective  Well appearing patient in no apparent distress; mood and affect are within normal limits.  A focused examination was performed including face, shoulders. Relevant physical exam findings are noted in the Assessment and Plan.  Objective  Head - Anterior (Face): Spotty hyperpigmentation of face and chest. 4 medium active papules of face and multiple of shoulder.  Blood pressure today - 139/91  Patient's weight today - 263.8  Images         Assessment & Plan  Acne vulgaris Head - Anterior (Face) With Dyschromia  Discussed condition and treatment options. Patient states no kidney issues. She is not currently pregnant and not planning to get pregnant.  Start Doxycycline 100 mg 1 po qd with food and plenty of fluid - Doxycycline should be taken with food to prevent nausea. Do not lay down for 30 minutes after taking. Be cautious with sun exposure and use good sun protection while on this medication. Pregnant women should not take this medication.    Spironolactone 25 mg 1 po qd - Spironolactone can cause increased urination and cause blood pressure to decrease. Please watch for signs of lightheadedness and be cautious when changing position. It can sometimes cause breast tenderness or an irregular period in premenopausal women. It can also increase potassium. The increase in potassium usually is not a concern unless you are taking other  medicines that also increase potassium, so please be sure your doctor knows all of the other medications you are taking. This medication should not be taken by pregnant women.  This medicine should also not be taken together with sulfa drugs like Bactrim (trimethoprim/sulfamethexazole).   Differin 0.3% gel qhs - Topical retinoid medications like tretinoin/Retin-A, adapalene/Differin, tazarotene/Fabior, and Epiduo/Epiduo Forte can cause dryness and irritation when first started. Only apply a pea-sized amount to the entire affected area. Avoid applying it around the eyes, edges of mouth and creases at the nose. If you experience irritation, use a good moisturizer first and/or apply the medicine less often. If you are doing well with the medicine, you can increase how often you use it until you are applying every night. Be careful with sun protection while using this medication as it can make you sensitive to the sun. This medicine should not be used by pregnant women.    May consider Winlevi in the future.  doxycycline (VIBRA-TABS) 100 MG tablet - Head - Anterior (Face)  spironolactone (ALDACTONE) 25 MG tablet - Head - Anterior (Face)  Adapalene (DIFFERIN) 0.3 % gel - Head - Anterior (Face)  Return in about 2 months (around 12/05/2020) for Acne.   I, Ashok Cordia, CMA, am acting as scribe for Sarina Ser, MD .  Documentation: I have reviewed the above documentation for accuracy and completeness, and I agree with the above.  Sarina Ser, MD

## 2020-10-05 NOTE — Patient Instructions (Addendum)
Doxycycline should be taken with food to prevent nausea. Do not lay down for 30 minutes after taking. Be cautious with sun exposure and use good sun protection while on this medication. Pregnant women should not take this medication.   Topical retinoid medications like tretinoin/Retin-A, adapalene/Differin, tazarotene/Fabior, and Epiduo/Epiduo Forte can cause dryness and irritation when first started. Only apply a pea-sized amount to the entire affected area. Avoid applying it around the eyes, edges of mouth and creases at the nose. If you experience irritation, use a good moisturizer first and/or apply the medicine less often. If you are doing well with the medicine, you can increase how often you use it until you are applying every night. Be careful with sun protection while using this medication as it can make you sensitive to the sun. This medicine should not be used by pregnant women.   Spironolactone can cause increased urination and cause blood pressure to decrease. Please watch for signs of lightheadedness and be cautious when changing position. It can sometimes cause breast tenderness or an irregular period in premenopausal women. It can also increase potassium. The increase in potassium usually is not a concern unless you are taking other medicines that also increase potassium, so please be sure your doctor knows all of the other medications you are taking. This medication should not be taken by pregnant women.  This medicine should also not be taken together with sulfa drugs like Bactrim (trimethoprim/sulfamethexazole).

## 2020-10-10 ENCOUNTER — Ambulatory Visit: Payer: Medicare Other | Admitting: Internal Medicine

## 2020-10-10 NOTE — Progress Notes (Signed)
Pt canceled today's appointment. Office staff will reach out to reschedule.  

## 2020-10-17 ENCOUNTER — Ambulatory Visit (INDEPENDENT_AMBULATORY_CARE_PROVIDER_SITE_OTHER): Payer: Medicare Other | Admitting: Internal Medicine

## 2020-10-17 VITALS — BP 141/82 | HR 84 | Temp 98.8°F | Resp 16 | Ht 67.0 in | Wt 258.0 lb

## 2020-10-17 DIAGNOSIS — G4733 Obstructive sleep apnea (adult) (pediatric): Secondary | ICD-10-CM

## 2020-10-17 DIAGNOSIS — I10 Essential (primary) hypertension: Secondary | ICD-10-CM

## 2020-10-17 DIAGNOSIS — Z6841 Body Mass Index (BMI) 40.0 and over, adult: Secondary | ICD-10-CM | POA: Diagnosis not present

## 2020-10-17 NOTE — Progress Notes (Unsigned)
Sleep Medicine   Office Visit  Patient Name: Barbara Anderson DOB: [birth year] MRN [id number]    Chief Complaint: possible sleep apnea  Brief History:  Miyako was referred by her dentist due to TMJ. She reports that she has been told that she snores, stops breathing and gasps at night. The patient relates the following symptoms: gasping, frequent urination, reflux and morning headaches.  The patient goes to sleep at 9-10 p.m and wakes up at 5-7 a.m. She never feels rested.  she reports that her sleep quality is restless.  Patient has noted restlessness of her legs at night but not in bed.  The patient  relates no unusual behavior during the night.  The patient reports a history of daytime sleepiness. She nods off unexpectedly and will sleep for an hour or more .several times per day The Epworth Sleepiness Score is 5 out of 24 .   Cardiovascular risk factors include: hypertension and diabetes   ROS  General: (-) fever, (-) chills, (-) night sweat Nose and Sinuses: (-) nasal stuffiness or itchiness, (-) postnasal drip, (-) nosebleeds, (-) sinus trouble. Mouth and Throat: (-) sore throat, (-) hoarseness. Neck: (-) swollen glands, (-) enlarged thyroid, (-) neck pain. Respiratory: - cough, - shortness of breath, - wheezing. Neurologic: - numbness, - tingling. Psychiatric: - history of suicidal ideation, bipolar disorder, depression Sleep behavior: -sleep paralysis -hypnogogic hallucinations -dream enactment      -vivid dreams -cataplexy -night terrors -sleep walking   Current Medication: Outpatient Encounter Medications as of 10/17/2020  Medication Sig  . DULoxetine (CYMBALTA) 60 MG capsule Take by mouth.  . fluconazole (DIFLUCAN) 150 MG tablet   . Adapalene (DIFFERIN) 0.3 % gel Apply 1 application topically at bedtime.  . Alcohol Swabs (B-D SINGLE USE SWABS REGULAR) PADS USE TO CLEANSE SKIN EVERY DAY BEFORE CHECKING BLOOD SUGAR  . ALPRAZolam (XANAX) 1 MG tablet Take 1 mg by mouth 2 (two)  times daily as needed.   Marland Kitchen amphetamine-dextroamphetamine (ADDERALL) 20 MG tablet Take 1 tablet (20 mg total) by mouth in the morning, at noon, and at bedtime. Has not started yet 11/10/19  . ARIPiprazole (ABILIFY) 5 MG tablet Take 5 mg by mouth daily.  Marland Kitchen atorvastatin (LIPITOR) 20 MG tablet Take 1 tablet (20 mg total) by mouth daily.  . Cholecalciferol (VITAMIN D3) 25 MCG (1000 UT) CAPS Take 1 capsule (1,000 Units total) by mouth daily.  . Cholecalciferol 125 MCG (5000 UT) TABS Take by mouth.  . doxycycline (VIBRA-TABS) 100 MG tablet Take 1 tablet (100 mg total) by mouth daily. With food and plenty of fluid  . DULoxetine (CYMBALTA) 60 MG capsule Take 2 capsules (120 mg total) by mouth daily.  . ferrous sulfate 325 (65 FE) MG tablet Take by mouth.  . fluticasone (FLONASE) 50 MCG/ACT nasal spray USE 2 SPRAYS IN EACH NOSTRIL EVERY DAY  . folic acid (FOLVITE) 208 MCG tablet Take 1 tablet (800 mcg total) by mouth daily.  . Lancets Misc. (ACCU-CHEK FASTCLIX LANCET) KIT To check blood sugar once daily  . levothyroxine (SYNTHROID) 25 MCG tablet TAKE 1 TABLET EVERY DAY BEFORE BREAKFAST  . pregabalin (LYRICA) 200 MG capsule Take 1 capsule (200 mg total) by mouth 2 (two) times daily.  . QUEtiapine Fumarate (SEROQUEL XR) 150 MG 24 hr tablet Take 150 mg by mouth at bedtime.   Marland Kitchen spironolactone (ALDACTONE) 25 MG tablet Take 1 tablet (25 mg total) by mouth daily.  Marland Kitchen tiZANidine (ZANAFLEX) 2 MG tablet Take 2 mg by mouth 3 (  three) times daily.  Marland Kitchen topiramate (TOPAMAX) 200 MG tablet Take 1 tablet (200 mg total) by mouth at bedtime.  . traZODone (DESYREL) 150 MG tablet Take 1 tablet (150 mg total) by mouth at bedtime.   No facility-administered encounter medications on file as of 10/17/2020.    Surgical History: Past Surgical History:  Procedure Laterality Date  . ankle pin and plates placement     right  . AUGMENTATION MAMMAPLASTY    . CHOLECYSTECTOMY N/A 01/06/2019   Procedure: LAPAROSCOPIC CHOLECYSTECTOMY;   Surgeon: Vickie Epley, MD;  Location: [facility] ORS;  Service: General;  Laterality: N/A;  . COLONOSCOPY WITH PROPOFOL N/A 12/11/2019   Procedure: COLONOSCOPY WITH PROPOFOL;  Surgeon: Lin Landsman, MD;  Location: [facility] ENDOSCOPY;  Service: Gastroenterology;  Laterality: N/A;  . COSMETIC SURGERY  2005   BREASTS, ARM, ABDOMEN AFTER WEIGHT LOSS    Medical History: Past Medical History:  Diagnosis Date  . ADD (attention deficit disorder)   . Anxiety   . Back pain   . Cholecystitis, acute 01/06/2019  . Concussion 2001   head trauma-work related. Caused headaches and short term memory loss.  . Depression   . Diabetes mellitus without complication ([place])   . Overactive bladder   . Recurrent vaginitis     Family History: Non contributory to the present illness  Social History: Social History   Socioeconomic History  . Marital status: Divorced    Spouse name: Not on file  . Number of children: 0  . Years of education: Not on file  . Highest education level: Bachelor's degree (e.g., BA, AB, BS)  Occupational History  . Occupation: disability  Tobacco Use  . Smoking status: Never Smoker  . Smokeless tobacco: Never Used  Vaping Use  . Vaping Use: Never used  Substance and Sexual Activity  . Alcohol use: No    Alcohol/week: 0.0 standard drinks  . Drug use: No  . Sexual activity: Yes    Birth control/protection: None  Other Topics Concern  . Not on file  Social History Narrative  . Not on file   Social Determinants of Health   Financial Resource Strain: Low Risk   . Difficulty of Paying Living Expenses: Not hard at all  Food Insecurity: Food Insecurity Present  . Worried About Charity fundraiser in the Last Year: Often true  . Ran Out of Food in the Last Year: Often true  Transportation Needs: No Transportation Needs  . Lack of Transportation (Medical): No  . Lack of Transportation (Non-Medical): No  Physical Activity: Inactive  . Days of Exercise per Week: 0 days   . Minutes of Exercise per Session: 0 min  Stress: Stress Concern Present  . Feeling of Stress : Very much  Social Connections: Socially Isolated  . Frequency of Communication with Friends and Family: Once a week  . Frequency of Social Gatherings with Friends and Family: Once a week  . Attends Religious Services: Never  . Active Member of Clubs or Organizations: No  . Attends Archivist Meetings: Never  . Marital Status: Divorced  Human resources officer Violence: Not At Risk  . Fear of Current or Ex-Partner: No  . Emotionally Abused: No  . Physically Abused: No  . Sexually Abused: No    Vital Signs: Blood pressure (!) 141/82, pulse 84, temperature 98.8 F (37.1 C), temperature source Temporal, resp. rate 16, height '5\' 7"'  (1.702 m), weight 258 lb (117 kg), SpO2 99 %.  Examination: General Appearance: The patient  is well-developed, well-nourished, and in no distress. Neck Circumference: 38 Skin: Gross inspection of skin unremarkable. Head: normocephalic, no gross deformities. Eyes: no gross deformities noted. ENT: ears appear grossly normal Neurologic: Alert and oriented. No involuntary movements.    EPWORTH SLEEPINESS SCALE:  Scale:  (0)= no chance of dozing; (1)= slight chance of dozing; (2)= moderate chance of dozing; (3)= high chance of dozing  Chance  Situtation    Sitting and reading: 0    Watching TV: 2    Sitting Inactive in public: 0    As a passenger in car: 0      Lying down to rest: 2    Sitting and talking: 0    Sitting quielty after lunch: 1    In a car, stopped in traffic: 0   TOTAL SCORE:   5 out of 24    SLEEP STUDIES:  1. No studies on file   LABS: Recent Results (from the past 2160 hour(s))  POCT Urinalysis Dipstick     Status: Abnormal   Collection Time: 09/22/20 12:01 PM  Result Value Ref Range   Color, UA yellow    Clarity, UA clear    Glucose, UA Negative Negative   Bilirubin, UA neg    Ketones, UA neg    Spec Grav,  UA 1.020 1.010 - 1.025   Blood, UA neg    pH, UA 6.0 5.0 - 8.0   Protein, UA Negative Negative   Urobilinogen, UA     Nitrite, UA pos    Leukocytes, UA Negative Negative   Appearance     Odor    POCT Wet Prep with KOH     Status: Normal   Collection Time: 09/22/20 12:01 PM  Result Value Ref Range   Trichomonas, UA Negative    Clue Cells Wet Prep HPF POC neg    Epithelial Wet Prep HPF POC     Yeast Wet Prep HPF POC neg    Bacteria Wet Prep HPF POC     RBC Wet Prep HPF POC     WBC Wet Prep HPF POC     KOH Prep POC Negative Negative  Urine Culture     Status: None   Collection Time: 09/22/20 12:41 PM   Specimen: Urine, Clean Catch   UC  Result Value Ref Range   Urine Culture, Routine Final report    Organism ID, Bacteria Comment     Comment: Mixed urogenital flora 50,000-100,000 colony forming units per mL   NuSwab Vaginitis (VG)     Status: None   Collection Time: 09/22/20 12:48 PM  Result Value Ref Range   Atopobium vaginae Low - 0 Score   BVAB 2 Low - 0 Score   Megasphaera 1 Low - 0 Score    Comment: Calculate total score by adding the 3 individual bacterial vaginosis (BV) marker scores together.  Total score is interpreted as follows: Total score 0-1: Indicates the absence of BV. Total score   2: Indeterminate for BV. Additional clinical                  data should be evaluated to establish a                  diagnosis. Total score 3-6: Indicates the presence of BV. This test was developed and its performance characteristics determined by [facility].  It has not been cleared or approved by the Food and Drug Administration.    Candida albicans, NAA Negative Negative  Candida glabrata, NAA Negative Negative   Trich vag by NAA Negative Negative    Radiology: MM 3D SCREEN BREAST W/IMPLANT BILATERAL  Result Date: 03/28/2020 CLINICAL DATA:  Screening. EXAM: DIGITAL SCREENING BILATERAL MAMMOGRAM WITH IMPLANTS, CAD AND TOMO The patient has retropectoral implants.  Standard and implant displaced views were performed. COMPARISON:  Previous exam(s). ACR Breast Density Category b: There are scattered areas of fibroglandular density. FINDINGS: There are no findings suspicious for malignancy. Images were processed with CAD. IMPRESSION: No mammographic evidence of malignancy. A result letter of this screening mammogram will be mailed directly to the patient. RECOMMENDATION: Screening mammogram in one year. (Code:SM-B-01Y) BI-RADS CATEGORY  1:  Negative. Electronically Signed   By: Nolon Nations M.D.   On: 03/28/2020 09:08    No results found.  No results found.    Assessment and Plan: Patient Active Problem List   Diagnosis Date Noted  . Hypothyroid 08/09/2020  . Encounter for screening colonoscopy   . Anemia in chronic kidney disease 06/15/2019  . Benign hypertensive kidney disease with chronic kidney disease 06/15/2019  . Hypokalemia 06/15/2019  . Proteinuria 06/15/2019  . Stage 3a chronic kidney disease ([place]) 06/15/2019  . Edema of upper extremity 01/04/2019  . Lymphedema 09/03/2018  . Type 2 diabetes mellitus with microalbuminuria, without long-term current use of insulin ([place]) 07/21/2018  . Suicidal ideation 04/11/2017  . HTN (hypertension) 04/11/2017  . Chronic pain syndrome 04/11/2017  . PTSD (post-traumatic stress disorder) 04/11/2017  . OCD (obsessive compulsive disorder) 04/11/2017  . Panic disorder with agoraphobia 04/11/2017  . TBI (traumatic brain injury) ([place]) 04/11/2017  . Bipolar I disorder, current or most recent episode depressed, with psychotic features ([place]) 04/10/2017  . Difficulty concentrating 11/24/2015  . Abnormal bruising 10/26/2015  . Fibrositis 10/26/2015  . Cervical pain 10/26/2015  . Class 3 severe obesity due to excess calories with serious comorbidity and body mass index (BMI) of 40.0 to 44.9 in adult ([place]) 10/26/2015  . Disorder of labyrinth 10/26/2015  . Oligomenorrhea 08/30/2015  . Weight gain 08/30/2015  .  Costochondritis 04/14/2015  . Dyspnea 04/14/2015  . Recurrent vaginitis   . Clinical depression 09/21/2008  . Late effect of certain complications of trauma 16/38/4536  . Headache, variant migraine, intractable 04/16/2006   1. OSA (obstructive sleep apnea) Patient evaluation suggests high risk of sleep disordered breathing due to loud snoring, restless, poorly refreshing sleep, daytime sleepiness,gasping, frequent urination, reflux and morning headaches.   Patient has comorbid cardiovascular risk factors including: hypertension which could be exacerbated by pathologic sleep-disordered breathing. OSA- a PSG will be ordered. Follow up 30+ days after set up   2. Primary hypertension Hypertension Counseling:   The following hypertensive lifestyle modification were recommended and discussed:  1. Limiting alcohol intake to less than 1 oz/day of ethanol:(24 oz of beer or 8 oz of wine or 2 oz of 100-proof whiskey). 2. Take baby ASA 81 mg daily. 3. Importance of regular aerobic exercise and losing weight. 4. Reduce dietary saturated fat and cholesterol intake for overall cardiovascular health. 5. Maintaining adequate dietary potassium, calcium, and magnesium intake. 6. Regular monitoring of the blood pressure. 7. Reduce sodium intake to less than 100 mmol/day (less than 2.3 gm of sodium or less than 6 gm of sodium choride)   3. Class 3 severe obesity due to excess calories with serious comorbidity and body mass index (BMI) of 40.0 to 44.9 in adult [facility]) Obesity Counseling: Had a lengthy discussion regarding patients BMI and weight issues. Patient was  instructed on portion control as well as increased activity. Also discussed caloric restrictions with trying to maintain intake less than 2000 Kcal. Discussions were made in accordance with the 5As of weight management. Simple actions such as not eating late and if able to, taking a walk is suggested.  General Counseling: I have discussed the findings of  the evaluation and examination with [place].  I have also discussed any further diagnostic evaluation thatmay be needed or ordered today. Kaitlyne verbalizes understanding of the findings of todays visit. We also reviewed her medications today and discussed drug interactions and side effects including but not limited excessive drowsiness and altered mental states. We also discussed that there is always a risk not just to her but also people around her. she has been encouraged to call the office with any questions or concerns that should arise related to todays visit.  No orders of the defined types were placed in this encounter.       I have personally obtained a history, evaluated the patient, evaluated pertinent data, formulated the assessment and plan and placed orders.  This patient was seen today by Tressie Ellis, PA-C in collaboration with Dr. Devona Konig.   Richelle Ito Saunders Glance, PhD, FAASM  Diplomate, American Board of Sleep Medicine    Allyne Gee, MD [facility] Diplomate ABMS Pulmonary and Critical Care Medicine Sleep medicine

## 2020-11-14 ENCOUNTER — Ambulatory Visit: Payer: Medicare HMO

## 2020-11-26 ENCOUNTER — Other Ambulatory Visit: Payer: Self-pay | Admitting: Physician Assistant

## 2020-11-26 DIAGNOSIS — G894 Chronic pain syndrome: Secondary | ICD-10-CM

## 2020-11-28 NOTE — Telephone Encounter (Signed)
Requested medication (s) are due for refill today: no  Requested medication (s) are on the active medication list: yes  Last refill:  07/06/2020  Future visit scheduled: no  Notes to clinic:  this refill cannot be delegated    Requested Prescriptions  Pending Prescriptions Disp Refills   pregabalin (LYRICA) 200 MG capsule [Pharmacy Med Name: PREGABALIN  200MG   CAP] 180 capsule     Sig: TAKE 1 CAPSULE BY MOUTH  TWICE DAILY      Not Delegated - Neurology:  Anticonvulsants - Controlled Failed - 11/26/2020  1:19 PM      Failed - This refill cannot be delegated      Passed - Valid encounter within last 12 months    Recent Outpatient Visits           9 months ago Annual physical exam   [facility] [place], [place], [place]   1 year ago [place], [place], [place]   1 year ago Type 2 diabetes mellitus with microalbuminuria, without long-term current use of insulin [facility])   [place], [place], [place]   2 years ago Type 2 diabetes mellitus with microalbuminuria, without long-term current use of insulin [facility])   [place], [place], [place]   2 years ago Hypothyroidism due to acquired atrophy of thyroid   [place], [place], [place]       Future Appointments             In 1 week Ralene Bathe, MD [place]

## 2020-12-08 ENCOUNTER — Ambulatory Visit: Payer: Medicare Other | Admitting: Dermatology

## 2020-12-19 ENCOUNTER — Other Ambulatory Visit: Payer: Self-pay | Admitting: Physician Assistant

## 2020-12-19 DIAGNOSIS — J0140 Acute pansinusitis, unspecified: Secondary | ICD-10-CM

## 2020-12-19 DIAGNOSIS — E034 Atrophy of thyroid (acquired): Secondary | ICD-10-CM

## 2020-12-20 NOTE — Telephone Encounter (Signed)
Requested Prescriptions  Pending Prescriptions Disp Refills  . levothyroxine (SYNTHROID) 25 MCG tablet [Pharmacy Med Name: MYLAN-LEVOTHYROX 25MCG TABLET] 90 tablet 3    Sig: TAKE 1 TABLET BY MOUTH  DAILY BEFORE BREAKFAST     Endocrinology:  Hypothyroid Agents Failed - 12/19/2020  9:31 AM      Failed - TSH needs to be rechecked within 3 months after an abnormal result. Refill until TSH is due.      Passed - TSH in normal range and within 360 days    TSH  Date Value Ref Range Status  02/04/2020 2.470 0.450 - 4.500 uIU/mL Final         Passed - Valid encounter within last 12 months    Recent Outpatient Visits          10 months ago Annual physical exam   [facility] [place], [place], [place]   1 year ago [place], [place], [place]   1 year ago Type 2 diabetes mellitus with microalbuminuria, without long-term current use of insulin [facility]   [place], [place], [place]   2 years ago Type 2 diabetes mellitus with microalbuminuria, without long-term current use of insulin [facility])   [place], [place], [place]   2 years ago Hypothyroidism due to acquired atrophy of thyroid   [place], PA-C             . fluticasone (FLONASE) 50 MCG/ACT nasal spray [Pharmacy Med Name: Fluticasone Propionate 50 MCG/ACT Nasal Suspension] 48 g 1    Sig: USE 2 SPRAYS IN BOTH  NOSTRILS DAILY     Ear, Nose, and Throat: Nasal Preparations - Corticosteroids Passed - 12/19/2020  9:31 AM      Passed - Valid encounter within last 12 months    Recent Outpatient Visits          10 months ago Annual physical exam   [facility] [place], [place], [place]   1 year ago [place], [place], [place]   1 year ago Type 2 diabetes mellitus with microalbuminuria, without long-term current use of insulin [facility])   [place], [place], [place]   2 years ago Type 2 diabetes mellitus with microalbuminuria, without long-term current use of insulin [facility])   [place], [place], [place]   2 years ago Hypothyroidism due to acquired atrophy of thyroid   [place], [place], [place]

## 2020-12-27 ENCOUNTER — Telehealth: Payer: Self-pay

## 2020-12-27 NOTE — Telephone Encounter (Signed)
Copied from [place] [id number]. Topic: Appointment Scheduling - Scheduling Inquiry for Clinic >> Dec 27, 2020 12:51 PM Lennox Solders wrote: Reason for CRM: Pt would like a virtual appt this week. Pt is having cloudy urine, foul urine smell and troubling holding her urine x 1 wk

## 2020-12-27 NOTE — Telephone Encounter (Signed)
Patient was advised Telemed or UC due no availability.

## 2021-01-05 ENCOUNTER — Ambulatory Visit: Payer: Medicare HMO | Admitting: Family Medicine

## 2021-01-11 ENCOUNTER — Ambulatory Visit (INDEPENDENT_AMBULATORY_CARE_PROVIDER_SITE_OTHER): Payer: Medicare Other | Admitting: Internal Medicine

## 2021-01-11 ENCOUNTER — Other Ambulatory Visit: Payer: Self-pay

## 2021-01-11 VITALS — BP 135/82 | HR 70 | Temp 98.7°F | Ht 65.35 in | Wt 253.0 lb

## 2021-01-11 DIAGNOSIS — R8290 Unspecified abnormal findings in urine: Secondary | ICD-10-CM | POA: Diagnosis not present

## 2021-01-11 DIAGNOSIS — R350 Frequency of micturition: Secondary | ICD-10-CM | POA: Diagnosis not present

## 2021-01-11 LAB — WET PREP FOR TRICH, YEAST, CLUE
Clue Cell Exam: NEGATIVE
Trichomonas Exam: NEGATIVE
Yeast Exam: NEGATIVE

## 2021-01-11 LAB — MICROSCOPIC EXAMINATION
Epithelial Cells (non renal): NONE SEEN /hpf (ref 0–10)
RBC, Urine: NONE SEEN /hpf (ref 0–2)

## 2021-01-11 LAB — URINALYSIS, ROUTINE W REFLEX MICROSCOPIC
Bilirubin, UA: NEGATIVE
Glucose, UA: NEGATIVE
Nitrite, UA: NEGATIVE
Protein,UA: NEGATIVE
RBC, UA: NEGATIVE
Specific Gravity, UA: 1.02 (ref 1.005–1.030)
Urobilinogen, Ur: 0.2 mg/dL (ref 0.2–1.0)
pH, UA: 6.5 (ref 5.0–7.5)

## 2021-01-11 MED ORDER — SULFAMETHOXAZOLE-TRIMETHOPRIM 800-160 MG PO TABS
1.0000 | ORAL_TABLET | Freq: Two times a day (BID) | ORAL | 0 refills | Status: DC
Start: 2021-01-11 — End: 2021-02-20
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
## 2021-01-16 LAB — URINE CULTURE

## 2021-01-23 ENCOUNTER — Other Ambulatory Visit: Payer: Self-pay

## 2021-01-23 ENCOUNTER — Ambulatory Visit (INDEPENDENT_AMBULATORY_CARE_PROVIDER_SITE_OTHER): Payer: Medicare Other | Admitting: Family Medicine

## 2021-01-23 ENCOUNTER — Encounter: Payer: Self-pay | Admitting: Family Medicine

## 2021-01-23 VITALS — BP 136/89 | HR 106 | Temp 97.0°F | Resp 16 | Wt 239.0 lb

## 2021-01-23 DIAGNOSIS — E034 Atrophy of thyroid (acquired): Secondary | ICD-10-CM

## 2021-01-23 DIAGNOSIS — R000 Tachycardia, unspecified: Secondary | ICD-10-CM

## 2021-01-23 DIAGNOSIS — F315 Bipolar disorder, current episode depressed, severe, with psychotic features: Secondary | ICD-10-CM

## 2021-01-23 DIAGNOSIS — R809 Proteinuria, unspecified: Secondary | ICD-10-CM

## 2021-01-23 DIAGNOSIS — N1831 Chronic kidney disease, stage 3a: Secondary | ICD-10-CM

## 2021-01-23 DIAGNOSIS — E1129 Type 2 diabetes mellitus with other diabetic kidney complication: Secondary | ICD-10-CM

## 2021-01-23 NOTE — Progress Notes (Signed)
I,[name],acting as a Education administrator for [employer], PA-C.,have documented all relevant documentation on the behalf of Barbara Murders, PA-C,as directed by  [employer], PA-C while in the presence of [employer], PA-C.   Established patient visit   Patient: Barbara Anderson   DOB: 07-24-1971   50 y.o. Female  MRN: [id number] Visit Date: 01/23/2021  Today's healthcare provider: [name], PA-C   No chief complaint on file.  Subjective    HPI  Follow up of diabetes, hypothyroidism and CKD. Has noticed some tachycardia intermittently. No dyspnea, chest pains or swelling.  Past Medical History:  Diagnosis Date   ADD (attention deficit disorder)    Anxiety    Back pain    Cholecystitis, acute 01/06/2019   Concussion 2001   head trauma-work related. Caused headaches and short term memory loss.   Depression    Diabetes mellitus without complication ([place])    Overactive bladder    Recurrent vaginitis    Past Surgical History:  Procedure Laterality Date   ankle pin and plates placement     right   AUGMENTATION MAMMAPLASTY     CHOLECYSTECTOMY N/A 01/06/2019   Procedure: LAPAROSCOPIC CHOLECYSTECTOMY;  Surgeon: Vickie Epley, MD;  Location: [facility] ORS;  Service: General;  Laterality: N/A;   COLONOSCOPY WITH PROPOFOL N/A 12/11/2019   Procedure: COLONOSCOPY WITH PROPOFOL;  Surgeon: Lin Landsman, MD;  Location: [facility] ENDOSCOPY;  Service: Gastroenterology;  Laterality: N/A;   COSMETIC SURGERY  2005   BREASTS, ARM, ABDOMEN AFTER WEIGHT LOSS   Social History   Tobacco Use   Smoking status: Never   Smokeless tobacco: Never  Vaping Use   Vaping Use: Never used  Substance Use Topics   Alcohol use: No    Alcohol/week: 0.0 standard drinks   Drug use: No   Family Status  Relation Name Status   Mother  Deceased at age 70       kidney failure   Father  Deceased   Sister  Alive   Brother  Alive   Allergies  Allergen Reactions   Metformin And Related Diarrhea        Medications: Outpatient Medications Prior to Visit  Medication Sig   Adapalene (DIFFERIN) 0.3 % gel Apply 1 application topically at bedtime.   Alcohol Swabs (B-D SINGLE USE SWABS REGULAR) PADS USE TO CLEANSE SKIN EVERY DAY BEFORE CHECKING BLOOD SUGAR   ALPRAZolam (XANAX) 1 MG tablet Take 1 mg by mouth 2 (two) times daily as needed.    amphetamine-dextroamphetamine (ADDERALL) 20 MG tablet Take 1 tablet (20 mg total) by mouth in the morning, at noon, and at bedtime. Has not started yet 11/10/19   ARIPiprazole (ABILIFY) 5 MG tablet Take 5 mg by mouth daily.   atorvastatin (LIPITOR) 20 MG tablet Take 1 tablet (20 mg total) by mouth daily.   Cholecalciferol (VITAMIN D3) 25 MCG (1000 UT) CAPS Take 1 capsule (1,000 Units total) by mouth daily.   Cholecalciferol 125 MCG (5000 UT) TABS Take by mouth.   DULoxetine (CYMBALTA) 60 MG capsule Take 2 capsules (120 mg total) by mouth daily.   DULoxetine (CYMBALTA) 60 MG capsule Take by mouth.   ferrous sulfate 325 (65 FE) MG tablet Take by mouth.   fluconazole (DIFLUCAN) 150 MG tablet    fluticasone (FLONASE) 50 MCG/ACT nasal spray USE 2 SPRAYS IN BOTH  NOSTRILS DAILY   folic acid (FOLVITE) 876 MCG tablet Take 1 tablet (800 mcg total) by mouth daily.   Lancets Misc. (  ACCU-CHEK FASTCLIX LANCET) KIT To check blood sugar once daily   levothyroxine (SYNTHROID) 25 MCG tablet TAKE 1 TABLET BY MOUTH  DAILY BEFORE BREAKFAST   pregabalin (LYRICA) 200 MG capsule TAKE 1 CAPSULE BY MOUTH  TWICE DAILY   QUEtiapine Fumarate (SEROQUEL XR) 150 MG 24 hr tablet Take 150 mg by mouth at bedtime.    spironolactone (ALDACTONE) 25 MG tablet Take 1 tablet (25 mg total) by mouth daily.   sulfamethoxazole-trimethoprim (BACTRIM DS) 800-160 MG tablet Take 1 tablet by mouth 2 (two) times daily.   tiZANidine (ZANAFLEX) 2 MG tablet Take 2 mg by mouth 3 (three) times daily.   tiZANidine (ZANAFLEX) 4 MG tablet    topiramate (TOPAMAX) 200 MG tablet Take 1 tablet (200 mg total) by  mouth at bedtime.   traZODone (DESYREL) 150 MG tablet Take 1 tablet (150 mg total) by mouth at bedtime.   VRAYLAR 1.5 MG capsule Take 1.5 mg by mouth daily.   No facility-administered medications prior to visit.    Review of Systems  Constitutional: Negative.   HENT: Negative.    Respiratory: Negative.    Cardiovascular: Negative.   Gastrointestinal: Negative.   Musculoskeletal: Negative.       Objective    BP 136/89 (BP Location: Right Arm, Patient Position: Sitting, Cuff Size: Large)   Pulse (!) 106   Temp (!) 97 F (36.1 C) (Temporal)   Resp 16   Wt 239 lb (108.4 kg)   SpO2 97%   BMI 39.34 kg/m  BP Readings from Last 3 Encounters:  01/23/21 136/89  01/11/21 135/82  10/17/20 (!) 141/82   Wt Readings from Last 3 Encounters:  01/23/21 239 lb (108.4 kg)  01/11/21 253 lb (114.8 kg)  10/17/20 258 lb (117 kg)     Physical Exam Constitutional:      General: She is not in acute distress.    Appearance: She is well-developed.  HENT:     Head: Normocephalic and atraumatic.     Right Ear: Hearing and tympanic membrane normal.     Left Ear: Hearing and tympanic membrane normal.     Nose: Nose normal.  Eyes:     General: Lids are normal. No scleral icterus.       Right eye: No discharge.        Left eye: No discharge.     Conjunctiva/sclera: Conjunctivae normal.  Cardiovascular:     Rate and Rhythm: Normal rate and regular rhythm.     Pulses: Normal pulses.     Heart sounds: Normal heart sounds.  Pulmonary:     Effort: Pulmonary effort is normal. No respiratory distress.     Breath sounds: Normal breath sounds.  Abdominal:     General: Bowel sounds are normal.     Palpations: Abdomen is soft.  Musculoskeletal:        General: Normal range of motion.     Cervical back: Normal range of motion and neck supple.  Skin:    Findings: No lesion or rash.  Neurological:     Mental Status: She is alert and oriented to person, place, and time.  Psychiatric:         Speech: Speech normal.        Behavior: Behavior normal.        Thought Content: Thought content normal.      No results found for any visits on 01/23/21.  Assessment & Plan     1. Tachycardia Heart rate 106 but asymptomatic today. No  chest pains, dyspnea or peripheral edema. Recheck labs and should maintain follow up with cardiologist (Dr. Nehemiah Massed). Suspected OSA being evaluated by pulmonologist (Dr. Humphrey Rolls). - CBC with Differential/Platelet - Comprehensive metabolic panel - TSH  2. Bipolar I disorder, current or most recent episode depressed, with psychotic features ([place]) Followed by psychiatry. Managed with polypharmacy regimen. - CBC with Differential/Platelet - Comprehensive metabolic panel - TSH  3. Hypothyroidism due to acquired atrophy of thyroid History of high TSH 2 years ago. Presently stable on Levothyroxine 25 mcg qd.Will recheck labs. - CBC with Differential/Platelet - Comprehensive metabolic panel - T4 - TSH  4. Stage 3a chronic kidney disease ([place]) Followed by Dr. Juleen China (nephrologist). Recheck labs. - CBC with Differential/Platelet - Comprehensive metabolic panel  5. Type 2 diabetes mellitus with microalbuminuria, without long-term current use of insulin ([facility]) Managed by diet, Atorvastatin 20 mg qd and should exercise as lumbar radiculopathy allows. Unable to tolerate the diarrhea from Metformin. Recheck labs. - CBC with Differential/Platelet - Comprehensive metabolic panel - Lipid panel - Hemoglobin A1c    No follow-ups on file.      I, Erina Hamme, PA-C, have reviewed all documentation for this visit. The documentation on 01/23/21 for the exam, diagnosis, procedures, and orders are all accurate and complete.    Barbara Murders, PA-C  [employer] [phone] (phone) [phone] (fax)  [place]

## 2021-01-25 LAB — T4: T4, Total: 4.6 ug/dL (ref 4.5–12.0)

## 2021-01-25 LAB — CBC WITH DIFFERENTIAL/PLATELET
Basophils Absolute: 0.1 10*3/uL (ref 0.0–0.2)
Basos: 1 %
EOS (ABSOLUTE): 0.2 10*3/uL (ref 0.0–0.4)
Eos: 3 %
Hematocrit: 37.8 % (ref 34.0–46.6)
Hemoglobin: 12.6 g/dL (ref 11.1–15.9)
Immature Grans (Abs): 0 10*3/uL (ref 0.0–0.1)
Immature Granulocytes: 0 %
Lymphocytes Absolute: 1.3 10*3/uL (ref 0.7–3.1)
Lymphs: 27 %
MCH: 29.5 pg (ref 26.6–33.0)
MCHC: 33.3 g/dL (ref 31.5–35.7)
MCV: 89 fL (ref 79–97)
Monocytes Absolute: 0.4 10*3/uL (ref 0.1–0.9)
Monocytes: 9 %
Neutrophils Absolute: 2.8 10*3/uL (ref 1.4–7.0)
Neutrophils: 60 %
Platelets: 160 10*3/uL (ref 150–450)
RBC: 4.27 x10E6/uL (ref 3.77–5.28)
RDW: 13.3 % (ref 11.7–15.4)
WBC: 4.7 10*3/uL (ref 3.4–10.8)

## 2021-01-25 LAB — COMPREHENSIVE METABOLIC PANEL
ALT: 23 IU/L (ref 0–32)
AST: 40 IU/L (ref 0–40)
Albumin/Globulin Ratio: 1.4 (ref 1.2–2.2)
Albumin: 3.6 g/dL — ABNORMAL LOW (ref 3.8–4.9)
Alkaline Phosphatase: 93 IU/L (ref 44–121)
BUN/Creatinine Ratio: 9 (ref 9–23)
BUN: 10 mg/dL (ref 6–24)
Bilirubin Total: 0.2 mg/dL (ref 0.0–1.2)
CO2: 18 mmol/L — ABNORMAL LOW (ref 20–29)
Calcium: 8.5 mg/dL — ABNORMAL LOW (ref 8.7–10.2)
Chloride: 103 mmol/L (ref 96–106)
Creatinine, Ser: 1.1 mg/dL — ABNORMAL HIGH (ref 0.57–1.00)
Globulin, Total: 2.6 g/dL (ref 1.5–4.5)
Glucose: 96 mg/dL (ref 65–99)
Potassium: 4.4 mmol/L (ref 3.5–5.2)
Sodium: 139 mmol/L (ref 134–144)
Total Protein: 6.2 g/dL (ref 6.0–8.5)
eGFR: 61 mL/min/{1.73_m2} (ref >59)

## 2021-01-25 LAB — LIPID PANEL
Chol/HDL Ratio: 3.5 ratio (ref 0.0–4.4)
Cholesterol, Total: 175 mg/dL (ref 100–199)
HDL: 50 mg/dL (ref >39)
LDL Chol Calc (NIH): 104 mg/dL — ABNORMAL HIGH (ref 0–99)
Triglycerides: 114 mg/dL (ref 0–149)
VLDL Cholesterol Cal: 21 mg/dL (ref 5–40)

## 2021-01-25 LAB — TSH: TSH: 1.28 u[IU]/mL (ref 0.450–4.500)

## 2021-01-25 LAB — HEMOGLOBIN A1C
Est. average glucose Bld gHb Est-mCnc: 134 mg/dL
Hgb A1c MFr Bld: 6.3 % — ABNORMAL HIGH (ref 4.8–5.6)

## 2021-02-03 ENCOUNTER — Telehealth: Payer: Self-pay

## 2021-02-03 NOTE — Progress Notes (Signed)
Left Voice message  to confirmed patient telephone appointment on 02/06/2021 for CCM at 1:00 pm with Junius Argyle the Clinical pharmacist. Left message to have all medications, supplements, blood pressure and blood sugar logs available during appointment and to return call if need to reschedule.   Star Rating Drug: Atorvastatin 20 mg Last filled on 11/26/2020 for 90 day supply at [facility].   Any gaps in medications fill history? [place] Pharmacist Assistant [phone]

## 2021-02-06 ENCOUNTER — Telehealth: Payer: Self-pay

## 2021-02-06 NOTE — Progress Notes (Deleted)
Chronic Care Management Pharmacy Note  02/06/2021 Name:  Salinda Snedeker MRN:  [id number] DOB:  May 17, 1971  Summary: ***  Recommendations/Changes made from today's visit: ***  Plan: ***   Subjective: Sanvi Ehler is an 50 y.o. year old female who is a primary patient of Mar Daring, [place].  The CCM team was consulted for assistance with disease management and care coordination needs.    Engaged with patient by telephone for follow up visit in response to provider referral for pharmacy case management and/or care coordination services.   Consent to Services:  The patient was given information about Chronic Care Management services, agreed to services, and gave verbal consent prior to initiation of services.  Please see initial visit note for detailed documentation.   Patient Care Team: Mar Daring, PA-C as PCP - General (Family Medicine) [place] Crane, MD as Consulting Physician (Psychiatry) Lavonia Dana, MD as Consulting Physician (Nephrology) Chinita Pester, MD as Referring Physician (Anesthesiology) Lorelee Cover., MD (Ophthalmology) Germaine Pomfret, [facility] as Pharmacist (Pharmacist)  Recent office visits: ***  Recent consult visits: [facility] visits: {Hospital DC Yes/No:25215}   Objective:  Lab Results  Component Value Date   CREATININE 1.10 (H) 01/24/2021   BUN 10 01/24/2021   GFRNONAA 66 02/04/2020   GFRAA 76 02/04/2020   NA 139 01/24/2021   K 4.4 01/24/2021   CALCIUM 8.5 (L) 01/24/2021   CO2 18 (L) 01/24/2021   GLUCOSE 96 01/24/2021    Lab Results  Component Value Date/Time   HGBA1C 6.3 (H) 01/24/2021 09:13 AM   HGBA1C 5.9 (H) 02/04/2020 02:58 PM   MICROALBUR 20 02/04/2020 02:46 PM   MICROALBUR 50 07/21/2018 04:34 PM    Last diabetic Eye exam: No results found for: HMDIABEYEEXA  Last diabetic Foot exam: No results found for: HMDIABFOOTEX   Lab Results  Component Value Date   CHOL 175 01/24/2021   HDL 50  01/24/2021   LDLCALC 104 (H) 01/24/2021   TRIG 114 01/24/2021   CHOLHDL 3.5 01/24/2021    Hepatic Function Latest Ref Rng & Units 01/24/2021 02/04/2020 10/05/2019  Total Protein 6.0 - 8.5 g/dL 6.2 5.8(L) 6.9  Albumin 3.8 - 4.9 g/dL 3.6(L) 3.8 4.2  AST 0 - 40 IU/L 40 22 18  ALT 0 - 32 IU/L '23 22 18  ' Alk Phosphatase 44 - 121 IU/L 93 98 155(H)  Total Bilirubin 0.0 - 1.2 mg/dL 0.2 0.2 0.3  Bilirubin, Direct 0.0 - 0.2 mg/dL - - -    Lab Results  Component Value Date/Time   TSH 1.280 01/24/2021 09:13 AM   TSH 2.470 02/04/2020 02:58 PM   FREET4 1.00 09/26/2018 03:10 PM   FREET4 0.78 (L) 08/16/2018 01:48 AM    CBC Latest Ref Rng & Units 01/24/2021 02/04/2020 01/13/2020  WBC 3.4 - 10.8 x10E3/uL 4.7 11.2(H) 7.8  Hemoglobin 11.1 - 15.9 g/dL 12.6 12.3 11.9  Hematocrit 34.0 - 46.6 % 37.8 36.6 36.4  Platelets 150 - 450 x10E3/uL 160 290 248    Lab Results  Component Value Date/Time   VD25OH 28.8 (L) 02/04/2020 02:58 PM   VD25OH 40.1 01/01/2019 02:57 PM    Clinical ASCVD: No  The 10-year ASCVD risk score Mikey Bussing DC Jr., et al., 2013) is: 3.7%   Values used to calculate the score:     Age: 17 years     Sex: Female     Is Non-Hispanic African American: No     Diabetic: Yes     Tobacco  smoker: No     Systolic Blood Pressure: 428 mmHg     Is BP treated: Yes     HDL Cholesterol: 50 mg/dL     Total Cholesterol: 175 mg/dL    Depression screen [facility] 2/9 01/11/2021 08/09/2020 07/08/2019  Decreased Interest '1 1 2  ' Down, Depressed, Hopeless 0 1 1  PHQ - 2 Score '1 2 3  ' Altered sleeping 3 3 0  Tired, decreased energy '3 3 1  ' Change in appetite '3 2 2  ' Feeling bad or failure about yourself  3 0 0  Trouble concentrating '3 2 1  ' Moving slowly or fidgety/restless 3 0 0  Suicidal thoughts 0 0 0  PHQ-9 Score '19 12 7  ' Difficult doing work/chores Extremely dIfficult Very difficult Somewhat difficult    Social History   Tobacco Use  Smoking Status Never  Smokeless Tobacco Never   BP Readings from Last 3  Encounters:  01/23/21 136/89  01/11/21 135/82  10/17/20 (!) 141/82   Pulse Readings from Last 3 Encounters:  01/23/21 (!) 106  01/11/21 70  10/17/20 84   Wt Readings from Last 3 Encounters:  01/23/21 239 lb (108.4 kg)  01/11/21 253 lb (114.8 kg)  10/17/20 258 lb (117 kg)   BMI Readings from Last 3 Encounters:  01/23/21 39.34 kg/m  01/11/21 41.65 kg/m  10/17/20 40.41 kg/m    Assessment/Interventions: Review of patient past medical history, allergies, medications, health status, including review of consultants reports, laboratory and other test data, was performed as part of comprehensive evaluation and provision of chronic care management services.   SDOH:  (Social Determinants of Health) assessments and interventions performed: Yes  SDOH Screenings   Alcohol Screen: Not on file  Depression (PHQ2-9): Medium Risk   PHQ-2 Score: 19  Financial Resource Strain: Low Risk    Difficulty of Paying Living Expenses: Not hard at all  Food Insecurity: Not on file  Housing: Not on file  Physical Activity: Not on file  Social Connections: Not on file  Stress: Not on file  Tobacco Use: Low Risk    Smoking Tobacco Use: Never   Smokeless Tobacco Use: Never  Transportation Needs: No Transportation Needs   Lack of Transportation (Medical): No   Lack of Transportation (Non-Medical): No    CCM Care Plan  Allergies  Allergen Reactions   Metformin And Related Diarrhea    Medications Reviewed Today     Reviewed by Margo Common, PA-C (Physician Assistant) on 01/23/21 at [facility] Status: <None>   Medication Order Taking? Sig Documenting Provider Last Dose Status Informant  Adapalene (DIFFERIN) 0.3 % gel [id number] Yes Apply 1 application topically at bedtime. Ralene Bathe, MD Taking Active   Alcohol Swabs (B-D SINGLE USE SWABS REGULAR) PADS [id number] Yes USE TO CLEANSE SKIN EVERY DAY BEFORE CHECKING BLOOD SUGAR Mar Daring, PA-C Taking Active   ALPRAZolam  Duanne Moron) 1 MG tablet [id number] Yes Take 1 mg by mouth 2 (two) times daily as needed.  [provider] Taking Active   amphetamine-dextroamphetamine (ADDERALL) 20 MG tablet [id number] Yes Take 1 tablet (20 mg total) by mouth in the morning, at noon, and at bedtime. Has not started yet 11/10/19 Mar Daring, PA-C Taking Active   ARIPiprazole (ABILIFY) 5 MG tablet [id number] Yes Take 5 mg by mouth daily. [provider] Taking Active   atorvastatin (LIPITOR) 20 MG tablet [id number] Yes Take 1 tablet (20 mg total) by mouth daily. Mar Daring, PA-C Taking Active  Cholecalciferol (VITAMIN D3) 25 MCG (1000 UT) CAPS [id number] Yes Take 1 capsule (1,000 Units total) by mouth daily. Rubye Beach Taking Active   Cholecalciferol 125 MCG (5000 UT) TABS [id number] Yes Take by mouth. [provider] Taking Active   DULoxetine (CYMBALTA) 60 MG capsule [id number] Yes Take 2 capsules (120 mg total) by mouth daily. Pucilowska, Wardell Honour, MD Taking Active Self  DULoxetine (CYMBALTA) 60 MG capsule [id number] Yes Take by mouth. [provider] Taking Active   ferrous sulfate 325 (65 FE) MG tablet [id number] Yes Take by mouth. [provider] Taking Active   fluconazole (DIFLUCAN) 150 MG tablet [id number] Yes  [provider] Taking Active   fluticasone (FLONASE) 50 MCG/ACT nasal spray [id number] Yes USE 2 SPRAYS IN BOTH  NOSTRILS DAILY Bacigalupo, Dionne Bucy, MD Taking Active   folic acid (FOLVITE) 628 MCG tablet [id number] Yes Take 1 tablet (800 mcg total) by mouth daily. Mar Daring, PA-C Taking Active   Lancets Misc. (ACCU-CHEK FASTCLIX LANCET) KIT [id number] Yes To check blood sugar once daily Mar Daring, PA-C Taking Active Self  levothyroxine (SYNTHROID) 25 MCG tablet [id number] Yes TAKE 1 TABLET BY MOUTH  DAILY BEFORE BREAKFAST Virginia Crews, MD Taking Active   pregabalin (LYRICA) 200 MG capsule [id number] Yes TAKE 1 CAPSULE  BY MOUTH  TWICE DAILY Jerrol Banana., MD Taking Active   QUEtiapine Fumarate (SEROQUEL XR) 150 MG 24 hr tablet [id number] Yes Take 150 mg by mouth at bedtime.  [provider] Taking Active   spironolactone (ALDACTONE) 25 MG tablet [id number] Yes Take 1 tablet (25 mg total) by mouth daily. Ralene Bathe, MD Taking Active   sulfamethoxazole-trimethoprim (BACTRIM DS) 800-160 MG tablet [id number] Yes Take 1 tablet by mouth 2 (two) times daily. Charlynne Cousins, MD Taking Active   tiZANidine (ZANAFLEX) 2 MG tablet [id number] Yes Take 2 mg by mouth 3 (three) times daily. [provider] Taking Active   tiZANidine (ZANAFLEX) 4 MG tablet [id number] Yes  [provider] Taking Active   topiramate (TOPAMAX) 200 MG tablet [id number] Yes Take 1 tablet (200 mg total) by mouth at bedtime. Clovis Fredrickson, MD Taking Active Self  traZODone (DESYREL) 150 MG tablet [id number] Yes Take 1 tablet (150 mg total) by mouth at bedtime. Clovis Fredrickson, MD Taking Active Self  VRAYLAR 1.5 MG capsule [id number] Yes Take 1.5 mg by mouth daily. [provider] Taking Active             Patient Active Problem List   Diagnosis Date Noted   Hypothyroid 08/09/2020   Encounter for screening colonoscopy    Anemia in chronic kidney disease 06/15/2019   Benign hypertensive kidney disease with chronic kidney disease 06/15/2019   Hypokalemia 06/15/2019   Proteinuria 06/15/2019   Stage 3a chronic kidney disease ([place]) 06/15/2019   Edema of upper extremity 01/04/2019   Lymphedema 09/03/2018   Type 2 diabetes mellitus with microalbuminuria, without long-term current use of insulin ([place]) 07/21/2018   Suicidal ideation 04/11/2017   HTN (hypertension) 04/11/2017   Chronic pain syndrome 04/11/2017   PTSD (post-traumatic stress disorder) 04/11/2017   OCD (obsessive compulsive disorder) 04/11/2017   Panic disorder with agoraphobia 04/11/2017   TBI (traumatic brain injury) ([place])  04/11/2017   Bipolar I disorder, current or most recent episode depressed, with psychotic features ([place]) 04/10/2017   Difficulty concentrating 11/24/2015   Abnormal bruising 10/26/2015   Fibrositis 10/26/2015   Cervical pain 10/26/2015   Class 3 severe  obesity due to excess calories with serious comorbidity and body mass index (BMI) of 40.0 to 44.9 in adult [facility]) 10/26/2015   Disorder of labyrinth 10/26/2015   Oligomenorrhea 08/30/2015   Weight gain 08/30/2015   Costochondritis 04/14/2015   Dyspnea 04/14/2015   Recurrent vaginitis    Clinical depression 09/21/2008   Late effect of certain complications of trauma 19/37/9024   Headache, variant migraine, intractable 04/16/2006    Immunization History  Administered Date(s) Administered   Influenza,inj,Quad PF,6+ Mos 05/09/2017, 05/21/2018   Pneumococcal Polysaccharide-23 07/21/2018    Conditions to be addressed/monitored:  Hypertension, Hyperlipidemia, Diabetes, Chronic Kidney Disease, Hypothyroidism, Depression, and Bipolar Disorder, Chronic Pain, and PTSD, and OCD Disorder   There are no care plans that you recently modified to display for this patient.    Medication Assistance: None required.  Patient affirms current coverage meets needs.  Compliance/Adherence/Medication fill history: Care Gaps: ***  Star-Rating Drugs: ***  Patient's preferred pharmacy is:  [facility] [address] [place] [address] Phone: [phone] Fax: [phone]  [facility] ([facility] - [place], [place] [address] Phone: [phone] Fax: [phone]  [facility]  ([place]) - [place], [address] [place] [address] Phone: [phone] Fax: [phone]  Uses pill box? {Yes or If no, why not?:20788} Pt endorses ***%  compliance  We discussed: {Pharmacy options:24294} Patient decided to: {US Pharmacy Plan:23885}  Care Plan and Follow Up Patient Decision:  {FOLLOWUP:24991}  Plan: {CM FOLLOW UP SJGG:83662}  ***  Current Barriers:  {pharmacybarriers:24917}  Pharmacist Clinical Goal(s):  Patient will {PHARMACYGOALCHOICES:24921} through collaboration with PharmD and provider.   Interventions: 1:1 collaboration with Mar Daring, PA-C regarding development and update of comprehensive plan of care as evidenced by provider attestation and co-signature Inter-disciplinary care team collaboration (see longitudinal plan of care) Comprehensive medication review performed; medication list updated in electronic medical record  Hypertension (BP goal {CHL HP UPSTREAM Pharmacist BP [phone]}) -{US controlled/uncontrolled:25276} -Current treatment: Spironolactone 25 mg daily  -Medications previously tried: ***  -Current home readings: *** -Current dietary habits: *** -Current exercise habits: *** -{ACTIONS;DENIES/REPORTS:21021675} hypotensive/hypertensive symptoms -Educated on {CCM BP Counseling:25124} -Counseled to monitor BP at home ***, document, and provide log at future appointments -{CCMPHARMDINTERVENTION:25122}  Hyperlipidemia: (LDL goal < ***) -{US controlled/uncontrolled:25276} -Current treatment: Atorvastatin 20 mg daily  -Medications previously tried: ***  -Current dietary patterns: *** -Current exercise habits: *** -Educated on {CCM HLD Counseling:25126} -{CCMPHARMDINTERVENTION:25122}  Hypothyroidism (Goal: ***) -{US controlled/uncontrolled:25276} -Current treatment  Levothyroxine 25 mcg daily before breakfast  -Medications previously tried: ***  -{CCMPHARMDINTERVENTION:25122}   Depression/Anxiety (Goal: ***) -{US controlled/uncontrolled:25276} -Current treatment: Alprazolam 1 mg twice daily as needed  Adderall 20 mg three times daily  Arirpiprazole 5 mg daily   Duloxetine 60 mg 2 capsules + 1 capsule daily?  Seroquel XR 150 mg nightly  Trazodone 150 mg nightly  Vraylar 1.5 mg daily  -Medications previously tried/failed: *** -PHQ9: *** -GAD7: *** -Connected with *** for mental health support -Educated on {CCM mental health counseling:25127} -{CCMPHARMDINTERVENTION:25122}  Patient Goals/Self-Care Activities Patient will:  - {pharmacypatientgoals:24919}  Follow Up Plan: {CM FOLLOW UP HUTM:54650}

## 2021-02-16 ENCOUNTER — Encounter: Payer: Medicare HMO | Admitting: Internal Medicine

## 2021-02-20 ENCOUNTER — Telehealth: Payer: Self-pay

## 2021-02-20 ENCOUNTER — Ambulatory Visit (INDEPENDENT_AMBULATORY_CARE_PROVIDER_SITE_OTHER): Payer: Medicare HMO | Admitting: Family Medicine

## 2021-02-20 ENCOUNTER — Other Ambulatory Visit: Payer: Self-pay

## 2021-02-20 VITALS — BP 146/100 | HR 103 | Temp 98.2°F | Wt 246.0 lb

## 2021-02-20 DIAGNOSIS — E1159 Type 2 diabetes mellitus with other circulatory complications: Secondary | ICD-10-CM

## 2021-02-20 DIAGNOSIS — E1129 Type 2 diabetes mellitus with other diabetic kidney complication: Secondary | ICD-10-CM | POA: Diagnosis not present

## 2021-02-20 DIAGNOSIS — N915 Oligomenorrhea, unspecified: Secondary | ICD-10-CM

## 2021-02-20 DIAGNOSIS — R69 Illness, unspecified: Secondary | ICD-10-CM | POA: Diagnosis not present

## 2021-02-20 DIAGNOSIS — Z6841 Body Mass Index (BMI) 40.0 and over, adult: Secondary | ICD-10-CM | POA: Diagnosis not present

## 2021-02-20 DIAGNOSIS — R002 Palpitations: Secondary | ICD-10-CM | POA: Diagnosis not present

## 2021-02-20 DIAGNOSIS — E785 Hyperlipidemia, unspecified: Secondary | ICD-10-CM | POA: Diagnosis not present

## 2021-02-20 DIAGNOSIS — R000 Tachycardia, unspecified: Secondary | ICD-10-CM

## 2021-02-20 DIAGNOSIS — R809 Proteinuria, unspecified: Secondary | ICD-10-CM

## 2021-02-20 DIAGNOSIS — I152 Hypertension secondary to endocrine disorders: Secondary | ICD-10-CM | POA: Diagnosis not present

## 2021-02-20 DIAGNOSIS — E039 Hypothyroidism, unspecified: Secondary | ICD-10-CM | POA: Diagnosis not present

## 2021-02-20 DIAGNOSIS — Z1231 Encounter for screening mammogram for malignant neoplasm of breast: Secondary | ICD-10-CM

## 2021-02-20 DIAGNOSIS — Z0000 Encounter for general adult medical examination without abnormal findings: Secondary | ICD-10-CM | POA: Diagnosis not present

## 2021-02-20 DIAGNOSIS — F315 Bipolar disorder, current episode depressed, severe, with psychotic features: Secondary | ICD-10-CM

## 2021-02-20 DIAGNOSIS — E1169 Type 2 diabetes mellitus with other specified complication: Secondary | ICD-10-CM

## 2021-02-20 DIAGNOSIS — N1831 Chronic kidney disease, stage 3a: Secondary | ICD-10-CM

## 2021-02-20 MED ORDER — LISINOPRIL 20 MG PO TABS: 20.0000 mg | ORAL_TABLET | Freq: Every day | ORAL | 0 refills | Status: DC

## 2021-02-20 NOTE — Assessment & Plan Note (Addendum)
-   Well controlled - Referral for annual eye exam - Urine microalbumin today

## 2021-02-20 NOTE — Assessment & Plan Note (Signed)
Stable, followed by psychiatry; continue Abilify, Cymbalta, Seroquel, Topiramate, & Alprazolam prn

## 2021-02-20 NOTE — Assessment & Plan Note (Addendum)
Stable, check BMP in 2 weeks; check urine microalbumin today

## 2021-02-20 NOTE — Assessment & Plan Note (Signed)
Stable, continue Synthroid. 

## 2021-02-20 NOTE — Assessment & Plan Note (Addendum)
-   Poorly controlled - Continue Spironolactone - Start Lisinopril 20 mg daily - RTC in 2 weeks for BMP; will continue to monitor for closely possible hyperkalemia

## 2021-02-20 NOTE — Telephone Encounter (Signed)
[facility] referring for Well women's exam. Called and left voicemail for patient to call back to be scheduled.

## 2021-02-20 NOTE — Assessment & Plan Note (Signed)
Stable, discontinue atorvastatin

## 2021-02-20 NOTE — Progress Notes (Signed)
Annual Wellness Visit     Patient: Barbara Anderson, Female    DOB: 02-Dec-1970, 50 y.o.   MRN: [id number] Visit Date: 02/20/2021  Today's Provider: Lavon Paganini, MD   Chief Complaint  Patient presents with   Annual Exam    Subjective    Barbara Anderson is a 50 y.o. female who presents today for her Annual Wellness Visit. She reports consuming a general diet. The patient does not participate in regular exercise at present. She generally feels well. She reports not sleeping well. She does have additional problems to discuss today.   HPI  Hypertension - BP elevated in office today; currently on Spironolactone - Previously took Lisinopril, but pt. discontinued it in 2021 due to dizziness after BP became stabilized  Palpitations - Pt. requests medication for episodic tachycardia & palpitations - Pt. states that she has never had a cardiac workup; unremarkable CMP in 6/22  Type 2 Diabetes - Due for annual eye exam & urine microalbumin - Pt. states that BG at home ranges 90s-100s - Foot exam UTD  Hyperlipidemia - No longer taking atorvastatin, pt. states that she stopped taking it approximately 1 year ago  Bipolar I - Stable on Abilify, Cymbalta, Seroquel, Topiramate, & Alprazolam prn - Followed by psychiatrist - next appt in a couple of months  Insomnia - Pt. has been taking Trazodone, but complains that Trazodone causes bad dreams & excessive morning drowsiness  Perimenopause - Pt. endorses irregular menstrual cycles for the past 6-12 months; denies hot flashes, fatigue - Pt. requests GYN referral for annual exam  Health Maintenance - Will be due for annual mammo next month - UTD on colonoscopy & pap smear - Due for COVID vaccine, but pt. declines at this time   Medications: Outpatient Medications Prior to Visit  Medication Sig   Adapalene (DIFFERIN) 0.3 % gel Apply 1 application topically at bedtime.   Alcohol Swabs (B-D SINGLE USE SWABS REGULAR) PADS USE TO  CLEANSE SKIN EVERY DAY BEFORE CHECKING BLOOD SUGAR   ALPRAZolam (XANAX) 1 MG tablet Take 1 mg by mouth 2 (two) times daily as needed.    amphetamine-dextroamphetamine (ADDERALL) 20 MG tablet Take 1 tablet (20 mg total) by mouth in the morning, at noon, and at bedtime. Has not started yet 11/10/19   ARIPiprazole (ABILIFY) 5 MG tablet Take 5 mg by mouth daily.   atorvastatin (LIPITOR) 20 MG tablet Take 1 tablet (20 mg total) by mouth daily.   Cholecalciferol (VITAMIN D3) 25 MCG (1000 UT) CAPS Take 1 capsule (1,000 Units total) by mouth daily.   Cholecalciferol 125 MCG (5000 UT) TABS Take by mouth.   DULoxetine (CYMBALTA) 60 MG capsule Take 2 capsules (120 mg total) by mouth daily.   DULoxetine (CYMBALTA) 60 MG capsule Take by mouth.   ferrous sulfate 325 (65 FE) MG tablet Take by mouth.   fluconazole (DIFLUCAN) 150 MG tablet    fluticasone (FLONASE) 50 MCG/ACT nasal spray USE 2 SPRAYS IN BOTH  NOSTRILS DAILY   folic acid (FOLVITE) 532 MCG tablet Take 1 tablet (800 mcg total) by mouth daily.   Lancets Misc. (ACCU-CHEK FASTCLIX LANCET) KIT To check blood sugar once daily   levothyroxine (SYNTHROID) 25 MCG tablet TAKE 1 TABLET BY MOUTH  DAILY BEFORE BREAKFAST   pregabalin (LYRICA) 200 MG capsule TAKE 1 CAPSULE BY MOUTH  TWICE DAILY   QUEtiapine Fumarate (SEROQUEL XR) 150 MG 24 hr tablet Take 150 mg by mouth at bedtime.    spironolactone (ALDACTONE) 25  MG tablet Take 1 tablet (25 mg total) by mouth daily.   tiZANidine (ZANAFLEX) 2 MG tablet Take 2 mg by mouth 3 (three) times daily.   topiramate (TOPAMAX) 200 MG tablet Take 1 tablet (200 mg total) by mouth at bedtime.   traZODone (DESYREL) 150 MG tablet Take 1 tablet (150 mg total) by mouth at bedtime.   VRAYLAR 1.5 MG capsule Take 1.5 mg by mouth daily.   [DISCONTINUED] sulfamethoxazole-trimethoprim (BACTRIM DS) 800-160 MG tablet Take 1 tablet by mouth 2 (two) times daily.   [DISCONTINUED] tiZANidine (ZANAFLEX) 4 MG tablet    No  facility-administered medications prior to visit.    Allergies  Allergen Reactions   Metformin And Related Diarrhea    Patient Care Team: Mar Daring, PA-C as PCP - General (Family Medicine) [place] Crane, MD as Consulting Physician (Psychiatry) Lavonia Dana, MD as Consulting Physician (Nephrology) Chinita Pester, MD as Referring Physician (Anesthesiology) Lorelee Cover., MD (Ophthalmology) Germaine Pomfret, [facility] as Pharmacist (Pharmacist)  Review of Systems  Constitutional: Negative.  Negative for chills and fever.  Eyes: Negative.  Negative for visual disturbance.  Respiratory: Negative.  Negative for chest tightness and shortness of breath.   Cardiovascular: Negative.  Negative for chest pain and leg swelling.  Gastrointestinal: Negative.   Endocrine: Positive for polyuria. Negative for polydipsia and polyphagia.  Genitourinary:  Positive for menstrual problem.  Musculoskeletal:  Positive for myalgias.  Skin: Negative.   Psychiatric/Behavioral:  Positive for sleep disturbance.         Objective    Vitals: BP (!) 146/100 (BP Location: Right Arm, Patient Position: Sitting, Cuff Size: Large)   Pulse (!) 103   Temp 98.2 F (36.8 C) (Oral)   Wt 246 lb (111.6 kg)   SpO2 100%   BMI 40.49 kg/m     Physical Exam Constitutional:      Appearance: Normal appearance. She is obese.  HENT:     Head: Normocephalic and atraumatic.     Right Ear: External ear normal.     Left Ear: External ear normal.  Eyes:     Conjunctiva/sclera: Conjunctivae normal.  Cardiovascular:     Rate and Rhythm: Normal rate and regular rhythm.     Pulses: Normal pulses.     Heart sounds: Normal heart sounds.  Pulmonary:     Effort: Pulmonary effort is normal.     Breath sounds: Normal breath sounds.  Abdominal:     General: Abdomen is flat. Bowel sounds are normal.     Palpations: Abdomen is soft.  Skin:    General: Skin is warm and dry.  Neurological:     Mental Status:  She is alert.    Most recent functional status assessment: In your present state of health, do you have any difficulty performing the following activities: 02/20/2021  Hearing? N  Vision? N  Difficulty concentrating or making decisions? Y  Walking or climbing stairs? Y  Dressing or bathing? N  Doing errands, shopping? Y  Some recent data might be hidden   Most recent fall risk assessment: Fall Risk  02/20/2021  Falls in the past year? 1  Number falls in past yr: 1  Injury with Fall? 1  Risk for fall due to : -  Risk for fall due to: Comment -  Follow up -    Most recent depression screenings: PHQ 2/9 Scores 02/20/2021 01/11/2021  PHQ - 2 Score 6 1  PHQ- 9 Score 14 19   Most recent cognitive  screening: No flowsheet data found. Most recent Audit-C alcohol use screening Alcohol Use Disorder Test (AUDIT) 02/20/2021  1. How often do you have a drink containing alcohol? 0  2. How many drinks containing alcohol do you have on a typical day when you are drinking? 0  3. How often do you have six or more drinks on one occasion? 0  AUDIT-C Score 0  9. Have you or someone else been injured as a result of your drinking? -  10. Has a relative or friend or a doctor or another health worker been concerned about your drinking or suggested you cut down? -  Alcohol Use Disorder Identification Test Final Score (AUDIT) -  Alcohol Brief Interventions/Follow-up -   A score of 3 or more in women, and 4 or more in men indicates increased risk for alcohol abuse, EXCEPT if all of the points are from question 1   No results found for any visits on 02/20/21.  Assessment & Plan     Annual wellness visit done today including the all of the following: Reviewed patient's Family Medical History Reviewed and updated list of patient's medical providers Assessment of cognitive impairment was done Assessed patient's functional ability Established a written schedule for health screening [place] Completed and Reviewed  Exercise Activities and Dietary recommendations  Goals      Chronic Care Management     CARE PLAN ENTRY (see longitudinal plan of care for additional care plan information)  Current Barriers:  Chronic Disease Management support, education, and care coordination needs related to Hypertension, Hyperlipidemia, Diabetes, Chronic Kidney Disease, Hypothyroidism, Depression, and Depression, Bipolar Disorder, Chronic Pain, PTSD, OCD Disorder   Hypertension BP Readings from Last 3 Encounters:  05/23/20 140/78  02/04/20 132/86  01/15/20 130/90  Pharmacist Clinical Goal(s): Over the next 90 days, patient will work with PharmD and providers to maintain BP goal <140/90 Current regimen:  None Interventions: Discussed low salt diet and exercising as tolerated extensively Will initiate blood pressure monitoring plan  Patient self care activities - Over the next 90 days, patient will: Check blood pressure if symptomatic, document, and provide at future appointments Ensure daily salt intake < 2300 mg/day  Hyperlipidemia Lab Results  Component Value Date/Time   LDLCALC 81 02/04/2020 02:58 PM  Pharmacist Clinical Goal(s): Over the next 90 days, patient will work with PharmD and providers to maintain LDL goal < 100 Current regimen:  Atorvastatin 20 mg daily Interventions: Discussed low cholesterol diet and exercising as tolerated extensively Will initiate cholesterol monitoring plan   Diabetes Lab Results  Component Value Date/Time   HGBA1C 5.9 (H) 02/04/2020 02:58 PM   HGBA1C 6.0 (H) 10/05/2019 03:28 PM  Pharmacist Clinical Goal(s): Over the next 90 days, patient will work with PharmD and providers to maintain A1c goal <6.5% Current regimen:  None Interventions: Discussed carbohydrate counting and exercising as tolerated extensively Will initiate blood sugar monitoring plan  Patient self care activities - Over the next 90 days, patient will: Contact  provider with any episodes of hypoglycemia  Medication management Pharmacist Clinical Goal(s): Over the next 90 days, patient will work with PharmD and providers to maintain optimal medication adherence Current pharmacy: [facility] Interventions Comprehensive medication review performed. Continue current medication management strategy Patient self care activities - Over the next 90 days, patient will: Take medications as prescribed Report any questions or concerns to PharmD and/or provider(s)     Prevent falls     Recommend to remove any items from the  home that may cause slips or trips.        Immunization History  Administered Date(s) Administered   Influenza,inj,Quad PF,6+ Mos 05/09/2017, 05/21/2018   Pneumococcal Polysaccharide-23 07/21/2018    Health Maintenance  Topic Date Due   COVID-19 Vaccine (1) Never done   OPHTHALMOLOGY EXAM  01/28/2020   URINE MICROALBUMIN  02/03/2021   Zoster Vaccines- Shingrix (1 of 2) 04/13/2021 (Originally 07/27/2019)   FOOT EXAM  01/11/2022 (Originally 07/22/2019)   COLONOSCOPY (Pts 45-91yr Insurance coverage will need to be confirmed)  01/11/2022 (Originally 12/10/2020)   TETANUS/TDAP  01/11/2022 (Originally 07/26/1988)   INFLUENZA VACCINE  02/27/2021   HEMOGLOBIN A1C  07/26/2021   MAMMOGRAM  03/24/2022   PAP SMEAR-Modifier  07/07/2022   PNEUMOCOCCAL POLYSACCHARIDE VACCINE AGE 66-64 HIGH RISK  Completed   Hepatitis C Screening  Completed   HIV Screening  Completed   Pneumococcal Vaccine [id number] old  Aged Out   HPV VACCINES  Aged Out     Discussed health benefits of physical activity, and encouraged her to engage in regular exercise appropriate for her age and condition.    Problem List Items Addressed This Visit       Cardiovascular and Mediastinum   Hypertension associated with diabetes ([place]    - Poorly controlled - Continue Spironolactone - Start Lisinopril 20 mg daily - RTC in 2 weeks for BMP; will continue to monitor for  closely possible hyperkalemia       Relevant Medications   lisinopril (ZESTRIL) 20 MG tablet   Other Relevant Orders   Basic Metabolic Panel (BMET)     Endocrine   Type 2 diabetes mellitus with microalbuminuria, without long-term current use of insulin (HCC)    - Well controlled - Referral for annual eye exam - Urine microalbumin today       Relevant Medications   lisinopril (ZESTRIL) 20 MG tablet   Other Relevant Orders   POCT UA - Microalbumin   Hypothyroid    Stable, continue Synthroid       Hyperlipidemia associated with type 2 diabetes mellitus (HCC)    Stable, discontinue atorvastatin (pt. is no longer taking)       Relevant Medications   lisinopril (ZESTRIL) 20 MG tablet     Genitourinary   Stage 3a chronic kidney disease (HCC)    Stable, recheck BMP in 2 weeks; check urine microalbumin today       Relevant Orders   Basic Metabolic Panel (BMET)     Other   Oligomenorrhea   Relevant Orders   Ambulatory referral to Gynecology   Bipolar I disorder, current or most recent episode depressed, with psychotic features ([place]    Stable, followed by psychiatry; continue Abilify, Cymbalta, Seroquel, Topiramate, & Alprazolam prn       Palpitations   Stable, chronic; unremarkable CMP in 6/22 ECG & Zio patch today   Relevant Orders   LONG TERM MONITOR (3-14 DAYS)   EKG 12-Lead   Other Visit Diagnoses     Encounter for annual physical exam    -  Primary   Morbid obesity ([place]     Discussed importance of healthy weight management Discussed diet and exercise    BMI 40.0-44.9, adult ([facility]       Encounter for screening mammogram for malignant neoplasm of breast       Relevant Orders   MM 3D SCREEN BREAST BILATERAL   Tachycardia       Stable, chronic; unremarkable CMP in 6/22 ECG & Zio  patch today   Relevant Orders   LONG TERM MONITOR (3-14 DAYS)   EKG 12-Lead        Return in about 3 months (around 05/23/2021) for BP f/u, With new PCP.      Percell Locus, MS3  Patient seen along with MS3 student Percell Locus. I personally evaluated this patient along with the student, and verified all aspects of the history, physical exam, and medical decision making as documented by the student. I agree with the student's documentation and have made all necessary edits.  [place], Dionne Bucy, MD, [facility]

## 2021-02-21 NOTE — Telephone Encounter (Signed)
Called and left voicemail for patient to call back to be scheduled. 

## 2021-02-22 NOTE — Telephone Encounter (Signed)
Called and left voicemail for patient to call back to be scheduled. 

## 2021-02-23 NOTE — Telephone Encounter (Signed)
Called and left voicemail for patient to call back to be scheduled. Contact pcp about multiple attempts to reach patient has been unsuccessful

## 2021-03-02 ENCOUNTER — Other Ambulatory Visit: Payer: Self-pay | Admitting: Family Medicine

## 2021-03-02 DIAGNOSIS — Z1231 Encounter for screening mammogram for malignant neoplasm of breast: Secondary | ICD-10-CM

## 2021-03-10 ENCOUNTER — Other Ambulatory Visit: Payer: Self-pay

## 2021-03-10 DIAGNOSIS — L700 Acne vulgaris: Secondary | ICD-10-CM

## 2021-03-10 NOTE — Telephone Encounter (Signed)
Copied from [place] [phone]. Topic: General - Other >> Mar 10, 2021  5:07 PM Bayard Beaver wrote: Reason for CRM: stefanie form [employer] called about allowing  refills for patient of  100 day supply lisiopril   spironolact '25mg'$  [phone]

## 2021-03-13 MED ORDER — SPIRONOLACTONE 25 MG PO TABS: 25.0000 mg | ORAL_TABLET | Freq: Every day | ORAL | 0 refills | Status: DC

## 2021-03-13 MED ORDER — LISINOPRIL 20 MG PO TABS: 20.0000 mg | ORAL_TABLET | Freq: Every day | ORAL | 0 refills | Status: DC

## 2021-03-14 DIAGNOSIS — I152 Hypertension secondary to endocrine disorders: Secondary | ICD-10-CM | POA: Diagnosis not present

## 2021-03-14 DIAGNOSIS — N1831 Chronic kidney disease, stage 3a: Secondary | ICD-10-CM | POA: Diagnosis not present

## 2021-03-14 DIAGNOSIS — E1159 Type 2 diabetes mellitus with other circulatory complications: Secondary | ICD-10-CM | POA: Diagnosis not present

## 2021-03-15 ENCOUNTER — Telehealth: Payer: Self-pay

## 2021-03-15 LAB — BASIC METABOLIC PANEL
BUN/Creatinine Ratio: 14 (ref 9–23)
BUN: 13 mg/dL (ref 6–24)
CO2: 21 mmol/L (ref 20–29)
Calcium: 9.1 mg/dL (ref 8.7–10.2)
Chloride: 106 mmol/L (ref 96–106)
Creatinine, Ser: 0.93 mg/dL (ref 0.57–1.00)
Glucose: 103 mg/dL — ABNORMAL HIGH (ref 65–99)
Potassium: 4 mmol/L (ref 3.5–5.2)
Sodium: 141 mmol/L (ref 134–144)
eGFR: 74 mL/min/{1.73_m2} (ref >59)

## 2021-03-15 NOTE — Progress Notes (Signed)
Chronic Care Management Pharmacy Assistant   Name: Barbara Anderson  MRN: [id number] DOB: 04-30-1971  Reason for Encounter:Diabetes Disease State Call.   Recent office visits:  02/20/2021 Dr.Bacigalupo MD (PCP Office) Start Lisinopril 20 mg daily,Ambulatory referral to Gynecology,Return to PCP in about 3 months,Referral for annual eye exam ,Patient discontinue atorvastatin,Change Tizanidine from 4 mg to 2 mg  3 time daily 01/23/2021 Simona Huh Chrismon PA-C (PCP Office) No Medication Changes Noted  Recent consult visits:  01/11/2021 Dr.Vigg MD (Fam Med) start sulfamethoxazole-trimethoprim (BACTRIM DS) 800,Take 1 tablet by mouth 2 (two) times daily. 10/17/2020 Dr. Humphrey Rolls MD (Pulmonology) No Medication Changes Noted 03/18/2022Dr.Phillps MD (Pain Medicine)PROCEDURE: Radiofrequency ablation of left L3, L4, L5 medial branch nerves  10/05/2020 Dr.Kowalski MD (Dermatology)  Start Doxycycline 100 mg 1 po qd with food and plenty of fluid,start Spironolactone 25 mg 1 po qd , Start Differin 0.3% gel qhs, return to dermatology in about 2 months   31/51/7616 Alicia Copland PA-C (OBGYN)Start nitrofurantoin, macrocrystal-monohydrate, (MACROBID) 100 MG capsule Sig: Take 1 capsule (100 mg total) by mouth 2 (two) times daily for 5 days 09/16/2020  Dr.Phillps MD (Pain Medicine)PROCEDURE: Radiofrequency ablation of right L3, L4, L5 medial branch nerves 08/23/2020 Dr.Phillps MD (Pain Medicine)increased your tizanidine (muscle relaxant) to 33m every 8 hours as needed, Follow up in 3 months with Pain Medicine  Hospital visits:  None in previous 6 months  Medications: Outpatient Encounter Medications as of 03/15/2021  Medication Sig   Adapalene (DIFFERIN) 0.3 % gel Apply 1 application topically at bedtime.   Alcohol Swabs (B-D SINGLE USE SWABS REGULAR) PADS USE TO CLEANSE SKIN EVERY DAY BEFORE CHECKING BLOOD SUGAR   ALPRAZolam (XANAX) 1 MG tablet Take 1 mg by mouth 2 (two) times daily as needed.     amphetamine-dextroamphetamine (ADDERALL) 20 MG tablet Take 1 tablet (20 mg total) by mouth in the morning, at noon, and at bedtime. Has not started yet 11/10/19   ARIPiprazole (ABILIFY) 5 MG tablet Take 5 mg by mouth daily.   atorvastatin (LIPITOR) 20 MG tablet Take 1 tablet (20 mg total) by mouth daily.   Cholecalciferol (VITAMIN D3) 25 MCG (1000 UT) CAPS Take 1 capsule (1,000 Units total) by mouth daily.   Cholecalciferol 125 MCG (5000 UT) TABS Take by mouth.   DULoxetine (CYMBALTA) 60 MG capsule Take 2 capsules (120 mg total) by mouth daily.   DULoxetine (CYMBALTA) 60 MG capsule Take by mouth.   ferrous sulfate 325 (65 FE) MG tablet Take by mouth.   fluconazole (DIFLUCAN) 150 MG tablet    fluticasone (FLONASE) 50 MCG/ACT nasal spray USE 2 SPRAYS IN BOTH  NOSTRILS DAILY   folic acid (FOLVITE) 8073MCG tablet Take 1 tablet (800 mcg total) by mouth daily.   Lancets Misc. (ACCU-CHEK FASTCLIX LANCET) KIT To check blood sugar once daily   levothyroxine (SYNTHROID) 25 MCG tablet TAKE 1 TABLET BY MOUTH  DAILY BEFORE BREAKFAST   lisinopril (ZESTRIL) 20 MG tablet Take 1 tablet (20 mg total) by mouth daily.   pregabalin (LYRICA) 200 MG capsule TAKE 1 CAPSULE BY MOUTH  TWICE DAILY   QUEtiapine Fumarate (SEROQUEL XR) 150 MG 24 hr tablet Take 150 mg by mouth at bedtime.    spironolactone (ALDACTONE) 25 MG tablet Take 1 tablet (25 mg total) by mouth daily.   tiZANidine (ZANAFLEX) 2 MG tablet Take 2 mg by mouth 3 (three) times daily.   topiramate (TOPAMAX) 200 MG tablet Take 1 tablet (200 mg total) by mouth at bedtime.  traZODone (DESYREL) 150 MG tablet Take 1 tablet (150 mg total) by mouth at bedtime.   VRAYLAR 1.5 MG capsule Take 1.5 mg by mouth daily.   No facility-administered encounter medications on file as of 03/15/2021.    Care Gaps: COVID-19 Vaccine Ophthalmology Exam Influenza Vaccine Star Rating Drugs: Lisinopril 20 mg las filled 02/20/2021 90 day supply at [facility]. Atorvastatin  20 mg last filled 11/26/2020 90 day supply at [facility] Medication Fill gaps: Aripiprazole  5 MG last filled 12/09/2020 30 day supply Levothyroxine 25 MCG last filled 11/26/2020 90 day supply Pregabalin 200 MG last filled 07/06/2020 90 day supply. Tizanidine 2 MG last filled 08/24/2020 30 day supply.  Recent Relevant Labs: Lab Results  Component Value Date/Time   HGBA1C 6.3 (H) 01/24/2021 09:13 AM   HGBA1C 5.9 (H) 02/04/2020 02:58 PM   MICROALBUR 20 02/04/2020 02:46 PM   MICROALBUR 50 07/21/2018 04:34 PM    Kidney Function Lab Results  Component Value Date/Time   CREATININE 0.93 03/14/2021 01:40 PM   CREATININE 1.10 (H) 01/24/2021 09:13 AM   CREATININE 0.88 05/12/2014 07:47 PM   CREATININE 0.79 04/22/2014 03:03 PM   CREATININE 1.03 10/21/2013 08:28 PM   GFRNONAA 66 02/04/2020 02:58 PM   GFRNONAA >60 10/21/2013 08:28 PM   GFRAA 76 02/04/2020 02:58 PM   GFRAA >60 10/21/2013 08:28 PM    Current antihyperglycemic regimen:  None ID What recent interventions/DTPs have been made to improve glycemic control:  None ID Have there been any recent hospitalizations or ED visits since last visit with CPP? No I have attempted without success to contact this patient by phone three times to do her Diabetes Disease State call. I left a Voice message for patient to return my call. Line Busy 08/17,08/18,08/19  Adherence Review: Is the patient currently on a STATIN medication? Yes Is the patient currently on ACE/ARB medication? Yes Does the patient have >5 day gap between last estimated fill dates? Yes  [place] Pharmacist Assistant [phone]

## 2021-03-16 DIAGNOSIS — R002 Palpitations: Secondary | ICD-10-CM | POA: Diagnosis not present

## 2021-03-16 NOTE — Progress Notes (Signed)
NA,"Call can not be completed at this time"

## 2021-03-20 DIAGNOSIS — M7918 Myalgia, other site: Secondary | ICD-10-CM | POA: Diagnosis not present

## 2021-03-20 DIAGNOSIS — M47816 Spondylosis without myelopathy or radiculopathy, lumbar region: Secondary | ICD-10-CM | POA: Diagnosis not present

## 2021-03-20 DIAGNOSIS — G894 Chronic pain syndrome: Secondary | ICD-10-CM | POA: Diagnosis not present

## 2021-03-23 DIAGNOSIS — Z87820 Personal history of traumatic brain injury: Secondary | ICD-10-CM | POA: Diagnosis not present

## 2021-03-23 DIAGNOSIS — M797 Fibromyalgia: Secondary | ICD-10-CM | POA: Diagnosis not present

## 2021-03-23 DIAGNOSIS — F411 Generalized anxiety disorder: Secondary | ICD-10-CM | POA: Diagnosis not present

## 2021-03-23 DIAGNOSIS — R69 Illness, unspecified: Secondary | ICD-10-CM | POA: Diagnosis not present

## 2021-03-23 NOTE — Progress Notes (Signed)
Letter sent.

## 2021-03-23 NOTE — Progress Notes (Signed)
Letter mailed

## 2021-03-24 ENCOUNTER — Telehealth (INDEPENDENT_AMBULATORY_CARE_PROVIDER_SITE_OTHER): Payer: Medicare HMO | Admitting: Family Medicine

## 2021-03-24 DIAGNOSIS — R000 Tachycardia, unspecified: Secondary | ICD-10-CM

## 2021-03-24 DIAGNOSIS — R002 Palpitations: Secondary | ICD-10-CM | POA: Diagnosis not present

## 2021-03-24 NOTE — Telephone Encounter (Signed)
NA, call can not be completed.

## 2021-03-24 NOTE — Telephone Encounter (Signed)
Zio patch results reviewed from 02/20/2021.  Patient wore Zio patch for 9 days and 1 hour from 7/25-8/3.  Results are as below.  CMA's, please relay the last paragraph With my summary to the patient.  Patient had a minimum heart rate of 59 bpm, max heart rate of 161 bpm, and average heart rate of 85 bpm.  Predominant underlying rhythm was sinus rhythm.  Isolated SVE's were rare less than 1%, SVE couplets were rare less than 1%, and no SVE triplets were present.  Isolated VE's were rare less than 1% and no VE couplets or VE triplets were present.  Overall, this reflects intermittent sinus tachycardia.  This is not dangerous to the patient.  It just means that her heart rate gets high occasionally. We could do prn beta blocker for periods that she is symptomatic if she likes. This is reassuring.

## 2021-03-24 NOTE — Telephone Encounter (Signed)
Attempted  to reach patient but no voicemail box is set up, if patient returns call okay for [facility] triage to advise of Xio results. KW

## 2021-03-27 ENCOUNTER — Other Ambulatory Visit: Payer: Self-pay

## 2021-03-27 ENCOUNTER — Ambulatory Visit
Admission: RE | Admit: 2021-03-27 | Discharge: 2021-03-27 | Disposition: A | Discharge status: Home / Self Care | Payer: Medicare HMO | Source: Ambulatory Visit | Attending: Family Medicine | Admitting: Family Medicine

## 2021-03-27 ENCOUNTER — Ambulatory Visit: Payer: Medicare HMO | Admitting: Family Medicine

## 2021-03-27 DIAGNOSIS — Z1231 Encounter for screening mammogram for malignant neoplasm of breast: Secondary | ICD-10-CM | POA: Diagnosis not present

## 2021-03-28 ENCOUNTER — Encounter: Payer: Self-pay | Admitting: Family Medicine

## 2021-03-28 ENCOUNTER — Ambulatory Visit (INDEPENDENT_AMBULATORY_CARE_PROVIDER_SITE_OTHER): Payer: Medicare HMO | Admitting: Family Medicine

## 2021-03-28 VITALS — BP 130/70 | HR 95 | Ht 67.0 in | Wt 260.8 lb

## 2021-03-28 DIAGNOSIS — E1169 Type 2 diabetes mellitus with other specified complication: Secondary | ICD-10-CM

## 2021-03-28 DIAGNOSIS — R809 Proteinuria, unspecified: Secondary | ICD-10-CM | POA: Diagnosis not present

## 2021-03-28 DIAGNOSIS — E785 Hyperlipidemia, unspecified: Secondary | ICD-10-CM | POA: Diagnosis not present

## 2021-03-28 DIAGNOSIS — E1129 Type 2 diabetes mellitus with other diabetic kidney complication: Secondary | ICD-10-CM

## 2021-03-28 DIAGNOSIS — N1831 Chronic kidney disease, stage 3a: Secondary | ICD-10-CM

## 2021-03-28 DIAGNOSIS — Z6841 Body Mass Index (BMI) 40.0 and over, adult: Secondary | ICD-10-CM | POA: Diagnosis not present

## 2021-03-28 DIAGNOSIS — R69 Illness, unspecified: Secondary | ICD-10-CM | POA: Diagnosis not present

## 2021-03-28 DIAGNOSIS — R000 Tachycardia, unspecified: Secondary | ICD-10-CM | POA: Diagnosis not present

## 2021-03-28 DIAGNOSIS — F315 Bipolar disorder, current episode depressed, severe, with psychotic features: Secondary | ICD-10-CM

## 2021-03-28 MED ORDER — METOPROLOL SUCCINATE ER 25 MG PO TB24: 25.0000 mg | ORAL_TABLET | Freq: Every day | ORAL | 3 refills | Status: DC

## 2021-03-28 NOTE — Telephone Encounter (Signed)
Advised patient of her results while in office today. Would like to go ahead and start beta blocker.

## 2021-03-28 NOTE — Progress Notes (Signed)
    Established patient visit   Patient: Barbara Anderson   DOB: 06/12/1971   49 y.o. Female  MRN: [id number] Visit Date: 03/28/2021  Today's healthcare provider:   Jr, MD   No chief complaint on file.  Subjective  -------------------------------------------------------------------------------------------------------------------- HPI  Patient wants to be seen due to fast heart rate. Advised patient of  Xio results provided by Dr. Bacigalupo on 03/24/21 (refer to telephone encounter). Patient would like to start beta blocker for periods that she is symptomatic. Today only  concern is something for palpitations. Fingers and feet has been cramping a lot lately believes her potassium levels are low and would like to start back on medication if so. Patient has also been experiencing hair loss for about 1 year now and has worsened in the last 6 months. Wants to know if its due to any deficiency. Pt states she has never mentioned this before today.     Medications: Outpatient Medications Prior to Visit  Medication Sig   Adapalene (DIFFERIN) 0.3 % gel Apply 1 application topically at bedtime.   Alcohol Swabs (B-D SINGLE USE SWABS REGULAR) PADS USE TO CLEANSE SKIN EVERY DAY BEFORE CHECKING BLOOD SUGAR   ALPRAZolam (XANAX) 1 MG tablet Take 1 mg by mouth 2 (two) times daily as needed.    amphetamine-dextroamphetamine (ADDERALL) 20 MG tablet Take 1 tablet (20 mg total) by mouth in the morning, at noon, and at bedtime. Has not started yet 11/10/19   ARIPiprazole (ABILIFY) 5 MG tablet Take 5 mg by mouth daily.   atorvastatin (LIPITOR) 20 MG tablet Take 1 tablet (20 mg total) by mouth daily.   Cholecalciferol (VITAMIN D3) 25 MCG (1000 UT) CAPS Take 1 capsule (1,000 Units total) by mouth daily.   Cholecalciferol 125 MCG (5000 UT) TABS Take by mouth.   DULoxetine (CYMBALTA) 60 MG capsule Take 2 capsules (120 mg total) by mouth daily.   DULoxetine (CYMBALTA) 60 MG capsule Take by mouth.    ferrous sulfate 325 (65 FE) MG tablet Take by mouth.   fluconazole (DIFLUCAN) 150 MG tablet    fluticasone (FLONASE) 50 MCG/ACT nasal spray USE 2 SPRAYS IN BOTH  NOSTRILS DAILY   folic acid (FOLVITE) 800 MCG tablet Take 1 tablet (800 mcg total) by mouth daily.   Lancets Misc. (ACCU-CHEK FASTCLIX LANCET) KIT To check blood sugar once daily   levothyroxine (SYNTHROID) 25 MCG tablet TAKE 1 TABLET BY MOUTH  DAILY BEFORE BREAKFAST   lisinopril (ZESTRIL) 20 MG tablet Take 1 tablet (20 mg total) by mouth daily.   pregabalin (LYRICA) 200 MG capsule TAKE 1 CAPSULE BY MOUTH  TWICE DAILY   QUEtiapine Fumarate (SEROQUEL XR) 150 MG 24 hr tablet Take 150 mg by mouth at bedtime.    spironolactone (ALDACTONE) 25 MG tablet Take 1 tablet (25 mg total) by mouth daily.   tiZANidine (ZANAFLEX) 2 MG tablet Take 2 mg by mouth 3 (three) times daily.   topiramate (TOPAMAX) 200 MG tablet Take 1 tablet (200 mg total) by mouth at bedtime.   traZODone (DESYREL) 150 MG tablet Take 1 tablet (150 mg total) by mouth at bedtime.   VRAYLAR 1.5 MG capsule Take 1.5 mg by mouth daily.   No facility-administered medications prior to visit.    Review of Systems  Cardiovascular:  Positive for palpitations.  All other systems reviewed and are negative.      Objective  -------------------------------------------------------------------------------------------------------------------- There were no vitals taken for this visit. Physical Exam Vitals reviewed.  Constitutional:        General: She is not in acute distress.    Appearance: She is well-developed.  HENT:     Head: Normocephalic and atraumatic.     Right Ear: Hearing and tympanic membrane normal.     Left Ear: Hearing and tympanic membrane normal.     Nose: Nose normal.  Eyes:     General: Lids are normal. No scleral icterus.       Right eye: No discharge.        Left eye: No discharge.     Conjunctiva/sclera: Conjunctivae normal.  Cardiovascular:     Rate and  Rhythm: Normal rate and regular rhythm.     Pulses: Normal pulses.     Heart sounds: Normal heart sounds.  Pulmonary:     Effort: Pulmonary effort is normal. No respiratory distress.     Breath sounds: Normal breath sounds.  Abdominal:     General: Bowel sounds are normal.     Palpations: Abdomen is soft.  Musculoskeletal:        General: Normal range of motion.     Cervical back: Normal range of motion and neck supple.  Skin:    Findings: No lesion or rash.  Neurological:     Mental Status: She is alert and oriented to person, place, and time.  Psychiatric:        Speech: Speech normal.        Behavior: Behavior normal.        Thought Content: Thought content normal.      No results found for any visits on 03/28/21.  Assessment & Plan  ---------------------------------------------------------------------------------------------------------------------- 1. Sinus tachycardia Chart metoprolol low-dose.  Follow-up in a couple of months. - metoprolol succinate (TOPROL-XL) 25 MG 24 hr tablet; Take 1 tablet (25 mg total) by mouth daily.  Dispense: 90 tablet; Refill: 3  2. Type 2 diabetes mellitus with microalbuminuria, without long-term current use of insulin (HCC)   3. Hyperlipidemia associated with type 2 diabetes mellitus (HCC)   4. Stage 3a chronic kidney disease (HCC)   5. Class 3 severe obesity due to excess calories with serious comorbidity and body mass index (BMI) of 40.0 to 44.9 in adult [facility]) Try biotin for skin problems.  6. Bipolar I disorder, current or most recent episode depressed, with psychotic features ([place]) Followed by psychiatry   No follow-ups on file.      I, Wilhemena Durie, MD, have reviewed all documentation for this visit. The documentation on 04/01/21 for the exam, diagnosis, procedures, and orders are all accurate and complete.    Mihail Prettyman Cranford Mon, MD  [facility] [phone] (phone) [phone] (fax)  [place]

## 2021-04-14 ENCOUNTER — Telehealth: Payer: Self-pay

## 2021-04-14 NOTE — Progress Notes (Signed)
Chronic Care Management Pharmacy Assistant   Name: Liahna Brickner  MRN: [id number] DOB: 1971/06/11  Reason for Encounter:Diabetes Disease State Call.   Recent office visits:  03/28/2021 Dr.Gilbert MD (PCP Office) Start metoprolol succinate 25 MG 1 tablet daily   Recent consult visits:  03/20/2021 Trudee Grip CPP (Pain Medicine) No medication Changes noted  Hospital visits:  None in previous 6 months  Medications: Outpatient Encounter Medications as of 04/14/2021  Medication Sig   Adapalene (DIFFERIN) 0.3 % gel Apply 1 application topically at bedtime.   Alcohol Swabs (B-D SINGLE USE SWABS REGULAR) PADS USE TO CLEANSE SKIN EVERY DAY BEFORE CHECKING BLOOD SUGAR   ALPRAZolam (XANAX) 1 MG tablet Take 1 mg by mouth 2 (two) times daily as needed.    amphetamine-dextroamphetamine (ADDERALL) 20 MG tablet Take 1 tablet (20 mg total) by mouth in the morning, at noon, and at bedtime. Has not started yet 11/10/19   ARIPiprazole (ABILIFY) 5 MG tablet Take 5 mg by mouth daily.   atorvastatin (LIPITOR) 20 MG tablet Take 1 tablet (20 mg total) by mouth daily.   Cholecalciferol (VITAMIN D3) 25 MCG (1000 UT) CAPS Take 1 capsule (1,000 Units total) by mouth daily.   Cholecalciferol 125 MCG (5000 UT) TABS Take by mouth.   DULoxetine (CYMBALTA) 60 MG capsule Take 2 capsules (120 mg total) by mouth daily.   DULoxetine (CYMBALTA) 60 MG capsule Take by mouth.   ferrous sulfate 325 (65 FE) MG tablet Take by mouth.   fluconazole (DIFLUCAN) 150 MG tablet    fluticasone (FLONASE) 50 MCG/ACT nasal spray USE 2 SPRAYS IN BOTH  NOSTRILS DAILY   folic acid (FOLVITE) 921 MCG tablet Take 1 tablet (800 mcg total) by mouth daily.   Lancets Misc. (ACCU-CHEK FASTCLIX LANCET) KIT To check blood sugar once daily   levothyroxine (SYNTHROID) 25 MCG tablet TAKE 1 TABLET BY MOUTH  DAILY BEFORE BREAKFAST   lisinopril (ZESTRIL) 20 MG tablet Take 1 tablet (20 mg total) by mouth daily.   metoprolol succinate (TOPROL-XL) 25 MG  24 hr tablet Take 1 tablet (25 mg total) by mouth daily.   pregabalin (LYRICA) 200 MG capsule TAKE 1 CAPSULE BY MOUTH  TWICE DAILY   QUEtiapine Fumarate (SEROQUEL XR) 150 MG 24 hr tablet Take 150 mg by mouth at bedtime.    spironolactone (ALDACTONE) 25 MG tablet Take 1 tablet (25 mg total) by mouth daily.   tiZANidine (ZANAFLEX) 2 MG tablet Take 2 mg by mouth 3 (three) times daily.   topiramate (TOPAMAX) 200 MG tablet Take 1 tablet (200 mg total) by mouth at bedtime.   traZODone (DESYREL) 150 MG tablet Take 1 tablet (150 mg total) by mouth at bedtime.   VRAYLAR 1.5 MG capsule Take 1.5 mg by mouth daily.   No facility-administered encounter medications on file as of 04/14/2021.    Care Gaps: COVID-19 Vaccine Ophthalmology Exam Influenza Vaccine Shingrix Vaccine Star Rating Drugs: Lisinopril 20 mg las filled 02/20/2021 90 day supply at [facility]. Atorvastatin 20 mg last filled 11/26/2020 90 day supply at [facility] Medication Fill gaps: Aripiprazole  5 MG last filled 12/09/2020 30 day supply Levothyroxine 25 MCG last filled 11/26/2020 90 day supply Pregabalin 200 MG last filled 07/06/2020 90 day supply. Tizanidine 2 MG last filled 08/24/2020 30 day supply.  Recent Relevant Labs: Lab Results  Component Value Date/Time   HGBA1C 6.3 (H) 01/24/2021 09:13 AM   HGBA1C 5.9 (H) 02/04/2020 02:58 PM   MICROALBUR 20 02/04/2020 02:46 PM  MICROALBUR 50 07/21/2018 04:34 PM    Kidney Function Lab Results  Component Value Date/Time   CREATININE 0.93 03/14/2021 01:40 PM   CREATININE 1.10 (H) 01/24/2021 09:13 AM   CREATININE 0.88 05/12/2014 07:47 PM   CREATININE 0.79 04/22/2014 03:03 PM   CREATININE 1.03 10/21/2013 08:28 PM   XLEZVGJF 59 02/04/2020 02:58 PM   GFRNONAA >60 10/21/2013 08:28 PM   GFRAA 76 02/04/2020 02:58 PM   GFRAA >60 10/21/2013 08:28 PM    Current antihyperglycemic regimen:  None What recent interventions/DTPs have been made to improve glycemic control:   None  Have there been any recent hospitalizations or ED visits since last visit with CPP? No  I have attempted without success to contact this patient by phone three times to do her Diabetes Disease State call..Phone not in service 09/16,09/20,09/21  Adherence Review: Is the patient currently on a STATIN medication? Yes Is the patient currently on ACE/ARB medication? Yes Does the patient have >5 day gap between last estimated fill dates? Yes  [place] Pharmacist Assistant [phone]

## 2021-04-19 DIAGNOSIS — F5081 Binge eating disorder: Secondary | ICD-10-CM | POA: Diagnosis not present

## 2021-04-19 DIAGNOSIS — F411 Generalized anxiety disorder: Secondary | ICD-10-CM | POA: Diagnosis not present

## 2021-04-19 DIAGNOSIS — F909 Attention-deficit hyperactivity disorder, unspecified type: Secondary | ICD-10-CM | POA: Diagnosis not present

## 2021-04-19 DIAGNOSIS — F59 Unspecified behavioral syndromes associated with physiological disturbances and physical factors: Secondary | ICD-10-CM | POA: Diagnosis not present

## 2021-04-19 DIAGNOSIS — R69 Illness, unspecified: Secondary | ICD-10-CM | POA: Diagnosis not present

## 2021-04-19 DIAGNOSIS — E669 Obesity, unspecified: Secondary | ICD-10-CM | POA: Diagnosis not present

## 2021-04-19 DIAGNOSIS — Z713 Dietary counseling and surveillance: Secondary | ICD-10-CM | POA: Diagnosis not present

## 2021-04-19 DIAGNOSIS — R7303 Prediabetes: Secondary | ICD-10-CM | POA: Diagnosis not present

## 2021-04-19 DIAGNOSIS — F5109 Other insomnia not due to a substance or known physiological condition: Secondary | ICD-10-CM | POA: Diagnosis not present

## 2021-04-19 DIAGNOSIS — F319 Bipolar disorder, unspecified: Secondary | ICD-10-CM | POA: Diagnosis not present

## 2021-04-25 DIAGNOSIS — F319 Bipolar disorder, unspecified: Secondary | ICD-10-CM | POA: Diagnosis not present

## 2021-04-25 DIAGNOSIS — E669 Obesity, unspecified: Secondary | ICD-10-CM | POA: Diagnosis not present

## 2021-04-25 DIAGNOSIS — F909 Attention-deficit hyperactivity disorder, unspecified type: Secondary | ICD-10-CM | POA: Diagnosis not present

## 2021-04-25 DIAGNOSIS — F5109 Other insomnia not due to a substance or known physiological condition: Secondary | ICD-10-CM | POA: Diagnosis not present

## 2021-04-25 DIAGNOSIS — R69 Illness, unspecified: Secondary | ICD-10-CM | POA: Diagnosis not present

## 2021-04-25 DIAGNOSIS — F59 Unspecified behavioral syndromes associated with physiological disturbances and physical factors: Secondary | ICD-10-CM | POA: Diagnosis not present

## 2021-04-25 DIAGNOSIS — Z713 Dietary counseling and surveillance: Secondary | ICD-10-CM | POA: Diagnosis not present

## 2021-04-25 DIAGNOSIS — F411 Generalized anxiety disorder: Secondary | ICD-10-CM | POA: Diagnosis not present

## 2021-04-25 DIAGNOSIS — R7303 Prediabetes: Secondary | ICD-10-CM | POA: Diagnosis not present

## 2021-04-25 DIAGNOSIS — F5081 Binge eating disorder: Secondary | ICD-10-CM | POA: Diagnosis not present

## 2021-04-26 DIAGNOSIS — M47816 Spondylosis without myelopathy or radiculopathy, lumbar region: Secondary | ICD-10-CM | POA: Diagnosis not present

## 2021-04-28 ENCOUNTER — Ambulatory Visit (INDEPENDENT_AMBULATORY_CARE_PROVIDER_SITE_OTHER): Payer: Medicare HMO

## 2021-04-28 DIAGNOSIS — Z0000 Encounter for general adult medical examination without abnormal findings: Secondary | ICD-10-CM | POA: Diagnosis not present

## 2021-04-28 DIAGNOSIS — Z748 Other problems related to care provider dependency: Secondary | ICD-10-CM | POA: Diagnosis not present

## 2021-04-28 DIAGNOSIS — F339 Major depressive disorder, recurrent, unspecified: Secondary | ICD-10-CM

## 2021-04-28 DIAGNOSIS — R69 Illness, unspecified: Secondary | ICD-10-CM | POA: Diagnosis not present

## 2021-04-28 DIAGNOSIS — Z5941 Food insecurity: Secondary | ICD-10-CM | POA: Diagnosis not present

## 2021-04-28 DIAGNOSIS — Z59819 Housing instability, housed unspecified: Secondary | ICD-10-CM

## 2021-04-28 DIAGNOSIS — Z599 Problem related to housing and economic circumstances, unspecified: Secondary | ICD-10-CM | POA: Diagnosis not present

## 2021-04-28 NOTE — Progress Notes (Addendum)
Subjective:   Barbara Anderson is a 50 y.o. female who presents for Medicare Annual (Subsequent) preventive examination.  I connected with  [name] on 04/28/21 by an audio only telemedicine application and verified that I am speaking with the correct person using two identifiers.   I discussed the limitations, risks, security and privacy concerns of performing an evaluation and management service by telephone and the availability of in person appointments. I also discussed with the patient that there may be a patient responsible charge related to this service. The patient expressed understanding and verbally consented to this telephonic visit.  Location of Patient: Home Location of Provider: Office  List any persons and their role that are participating in the visit with the patient.  Anders Simmonds, CMA  Review of Systems    Refer to PCP       Objective:    There were no vitals filed for this visit. There is no height or weight on file to calculate BMI.  Advanced Directives 12/11/2019 11/10/2019 01/06/2019 01/05/2019 08/15/2018 04/09/2017 01/15/2015  Does Patient Have a Medical Advance Directive? No No No No No No No  Would patient like information on creating a medical advance directive? - No - Patient declined No - Patient declined No - Patient declined No - Patient declined - No - patient declined information  Some encounter information is confidential and restricted. Go to Review Flowsheets activity to see all data.    Current Medications (verified) Outpatient Encounter Medications as of 04/28/2021  Medication Sig   Adapalene (DIFFERIN) 0.3 % gel Apply 1 application topically at bedtime.   Alcohol Swabs (B-D SINGLE USE SWABS REGULAR) PADS USE TO CLEANSE SKIN EVERY DAY BEFORE CHECKING BLOOD SUGAR   ALPRAZolam (XANAX) 1 MG tablet Take 1 mg by mouth 2 (two) times daily as needed.    amphetamine-dextroamphetamine (ADDERALL) 20 MG tablet Take 1 tablet (20 mg total) by mouth in the  morning, at noon, and at bedtime. Has not started yet 11/10/19   ARIPiprazole (ABILIFY) 5 MG tablet Take 5 mg by mouth daily.   atorvastatin (LIPITOR) 20 MG tablet Take 1 tablet (20 mg total) by mouth daily.   Cholecalciferol (VITAMIN D3) 25 MCG (1000 UT) CAPS Take 1 capsule (1,000 Units total) by mouth daily.   Cholecalciferol 125 MCG (5000 UT) TABS Take by mouth.   DULoxetine (CYMBALTA) 60 MG capsule Take 2 capsules (120 mg total) by mouth daily.   DULoxetine (CYMBALTA) 60 MG capsule Take by mouth.   ferrous sulfate 325 (65 FE) MG tablet Take by mouth.   fluconazole (DIFLUCAN) 150 MG tablet    fluticasone (FLONASE) 50 MCG/ACT nasal spray USE 2 SPRAYS IN BOTH  NOSTRILS DAILY   folic acid (FOLVITE) 024 MCG tablet Take 1 tablet (800 mcg total) by mouth daily.   Lancets Misc. (ACCU-CHEK FASTCLIX LANCET) KIT To check blood sugar once daily   levothyroxine (SYNTHROID) 25 MCG tablet TAKE 1 TABLET BY MOUTH  DAILY BEFORE BREAKFAST   lisinopril (ZESTRIL) 20 MG tablet Take 1 tablet (20 mg total) by mouth daily.   metoprolol succinate (TOPROL-XL) 25 MG 24 hr tablet Take 1 tablet (25 mg total) by mouth daily.   pregabalin (LYRICA) 200 MG capsule TAKE 1 CAPSULE BY MOUTH  TWICE DAILY   QUEtiapine Fumarate (SEROQUEL XR) 150 MG 24 hr tablet Take 150 mg by mouth at bedtime.    spironolactone (ALDACTONE) 25 MG tablet Take 1 tablet (25 mg total) by mouth daily.   tiZANidine (ZANAFLEX)  2 MG tablet Take 2 mg by mouth 3 (three) times daily.   topiramate (TOPAMAX) 200 MG tablet Take 1 tablet (200 mg total) by mouth at bedtime.   traZODone (DESYREL) 150 MG tablet Take 1 tablet (150 mg total) by mouth at bedtime.   VRAYLAR 1.5 MG capsule Take 1.5 mg by mouth daily.   No facility-administered encounter medications on file as of 04/28/2021.    Allergies (verified) Metformin and related   History: Past Medical History:  Diagnosis Date   ADD (attention deficit disorder)    Anxiety    Back pain    Cholecystitis,  acute 01/06/2019   Concussion 2001   head trauma-work related. Caused headaches and short term memory loss.   Depression    Diabetes mellitus without complication ([place])    Overactive bladder    Recurrent vaginitis    Past Surgical History:  Procedure Laterality Date   ankle pin and plates placement     right   AUGMENTATION MAMMAPLASTY     CHOLECYSTECTOMY N/A 01/06/2019   Procedure: LAPAROSCOPIC CHOLECYSTECTOMY;  Surgeon: Vickie Epley, MD;  Location: [facility] ORS;  Service: General;  Laterality: N/A;   COLONOSCOPY WITH PROPOFOL N/A 12/11/2019   Procedure: COLONOSCOPY WITH PROPOFOL;  Surgeon: Lin Landsman, MD;  Location: [facility] ENDOSCOPY;  Service: Gastroenterology;  Laterality: N/A;   COSMETIC SURGERY  2005   BREASTS, ARM, ABDOMEN AFTER WEIGHT LOSS   Family History  Problem Relation Age of Onset   Diabetes Mother    Hypertension Mother    Depression Mother    Diabetes Father    Hypertension Father    Anxiety disorder Father    Depression Father    ADD / ADHD Father    Depression Sister    Bipolar disorder Sister    ADD / ADHD Brother    Breast cancer Neg Hx    Social History   Socioeconomic History   Marital status: Divorced    Spouse name: Not on file   Number of children: 0   Years of education: Not on file   Highest education level: Bachelor's degree (e.g., BA, AB, BS)  Occupational History   Occupation: disability  Tobacco Use   Smoking status: Never   Smokeless tobacco: Never  Vaping Use   Vaping Use: Never used  Substance and Sexual Activity   Alcohol use: No    Alcohol/week: 0.0 standard drinks   Drug use: No   Sexual activity: Yes    Partners: Male    Birth control/protection: None  Other Topics Concern   Not on file  Social History Narrative   Not on file   Social Determinants of Health   Financial Resource Strain: Low Risk    Difficulty of Paying Living Expenses: Not hard at all  Food Insecurity: Food Insecurity Present   Worried About  Charity fundraiser in the Last Year: Often true   Arboriculturist in the Last Year: Often true  Transportation Needs: Public librarian (Medical): Yes   Lack of Transportation (Non-Medical): Yes  Physical Activity: Inactive   Days of Exercise per Week: 0 days   Minutes of Exercise per Session: 0 min  Stress: Stress Concern Present   Feeling of Stress : Very much  Social Connections: Socially Isolated   Frequency of Communication with Friends and Family: Three times a week   Frequency of Social Gatherings with Friends and Family: Never   Attends Religious Services: Never  Active Member of Clubs or Organizations: No   Attends Archivist Meetings: Never   Marital Status: Divorced    Tobacco Counseling Counseling given: Not Answered   Clinical Intake:                 Diabetic?Pre-dm         Activities of Daily Living In your present state of health, do you have any difficulty performing the following activities: 04/28/2021 03/28/2021  Hearing? N N  Vision? N N  Difficulty concentrating or making decisions? Tempie Donning  Walking or climbing stairs? Y Y  Dressing or bathing? Y N  Doing errands, shopping? Y N  Some recent data might be hidden    Patient Care Team: Mar Daring, PA-C as PCP - General (Family Medicine) [place] Crane, MD as Consulting Physician (Psychiatry) Lavonia Dana, MD as Consulting Physician (Nephrology) Chinita Pester, MD as Referring Physician (Anesthesiology) Lorelee Cover., MD (Ophthalmology) Germaine Pomfret, [facility] as Pharmacist (Pharmacist)  Indicate any recent Medical Services you may have received from other than [facility] providers in the past year (date may be approximate).     Assessment:   This is a routine wellness examination for Garrie.  Hearing/Vision screen No results found.  Dietary issues and exercise activities discussed:     Goals Addressed   None   Depression  Screen PHQ 2/9 Scores 04/28/2021 03/28/2021 02/20/2021 01/11/2021 08/09/2020 07/08/2019 11/28/2018  PHQ - 2 Score '4 5 6 1 2 3 2  ' PHQ- 9 Score '14 19 14 19 12 7 13    ' Fall Risk Fall Risk  04/28/2021 03/28/2021 02/20/2021 11/10/2019 05/09/2017  Falls in the past year? '1 1 1 1 ' No  Number falls in past yr: '1 1 1 1 ' -  Injury with Fall? 0 1 1 0 -  Risk for fall due to : History of fall(s) History of fall(s) - Other (Comment) -  Risk for fall due to: Comment - - - Due to dizziness and knees are giving out. -  Follow up Falls evaluation completed - - Falls prevention discussed -    FALL RISK PREVENTION PERTAINING TO THE HOME:  Any stairs in or around the home? Yes  If so, are there any without handrails? No  Home free of loose throw rugs in walkways, pet beds, electrical cords, etc? Yes  Adequate lighting in your home to reduce risk of falls? Yes   ASSISTIVE DEVICES UTILIZED TO PREVENT FALLS:  Life alert? No  Use of a cane, walker or w/c? Yes  Grab bars in the bathroom? Yes  Shower chair or bench in shower? Yes  Elevated toilet seat or a handicapped toilet? Yes   TIMED UP AND GO:  Was the test performed? No .  Length of time to ambulate 10 feet:  sec.   Telehealth  Cognitive Function:     6CIT Screen 04/28/2021  What Year? 0 points  What month? 0 points  What time? 0 points  Count back from 20 2 points  Months in reverse 4 points  Repeat phrase 8 points  Total Score 14    Immunizations Immunization History  Administered Date(s) Administered   Influenza,inj,Quad PF,6+ Mos 05/09/2017, 05/21/2018   Pneumococcal Polysaccharide-23 07/21/2018    TDAP status: Due, Education has been provided regarding the importance of this vaccine. Advised may receive this vaccine at local pharmacy or Health Dept. Aware to provide a copy of the vaccination record if obtained from local pharmacy or Health Dept.  Verbalized acceptance and understanding.  Flu Vaccine status: Due, Education has been  provided regarding the importance of this vaccine. Advised may receive this vaccine at local pharmacy or Health Dept. Aware to provide a copy of the vaccination record if obtained from local pharmacy or Health Dept. Verbalized acceptance and understanding.  Pneumococcal vaccine status: Due, Education has been provided regarding the importance of this vaccine. Advised may receive this vaccine at local pharmacy or Health Dept. Aware to provide a copy of the vaccination record if obtained from local pharmacy or Health Dept. Verbalized acceptance and understanding.  Covid-19 vaccine status: Declined, Education has been provided regarding the importance of this vaccine but patient still declined. Advised may receive this vaccine at local pharmacy or Health Dept.or vaccine clinic. Aware to provide a copy of the vaccination record if obtained from local pharmacy or Health Dept. Verbalized acceptance and understanding.  Qualifies for Shingles Vaccine? Yes   Zostavax completed No   Shingrix Completed?: No.    Education has been provided regarding the importance of this vaccine. Patient has been advised to call insurance company to determine out of pocket expense if they have not yet received this vaccine. Advised may also receive vaccine at local pharmacy or Health Dept. Verbalized acceptance and understanding.  Screening Tests Health Maintenance  Topic Date Due   COVID-19 Vaccine (1) Never done   Zoster Vaccines- Shingrix (1 of 2) Never done   OPHTHALMOLOGY EXAM  01/28/2020   INFLUENZA VACCINE  02/27/2021   FOOT EXAM  01/11/2022 (Originally 07/22/2019)   COLONOSCOPY (Pts 45-53yr Insurance coverage will need to be confirmed)  01/11/2022 (Originally 12/10/2020)   TETANUS/TDAP  01/11/2022 (Originally 07/26/1988)   HEMOGLOBIN A1C  07/26/2021   PAP SMEAR-Modifier  07/07/2022   MAMMOGRAM  03/28/2023   Hepatitis C Screening  Completed   HIV Screening  Completed   HPV VACCINES  Aged Out    Health  Maintenance  Health Maintenance Due  Topic Date Due   COVID-19 Vaccine (1) Never done   Zoster Vaccines- Shingrix (1 of 2) Never done   OPHTHALMOLOGY EXAM  01/28/2020   INFLUENZA VACCINE  02/27/2021    Colorectal cancer screening: Type of screening: Colonoscopy. Completed 2021. Repeat every 5 years  Mammogram status: Ordered  . Pt provided with contact info and advised to call to schedule appt.     Lung Cancer Screening: (Low Dose CT Chest recommended if Age 506-80years, 30 pack-year currently smoking OR have quit w/in 15years.) does not qualify.   Lung Cancer Screening Referral:   Additional Screening:  Hepatitis C Screening: does qualify; To be done by PCP  Vision Screening: Recommended annual ophthalmology exams for early detection of glaucoma and other disorders of the eye. Is the patient up to date with their annual eye exam?  Yes  Who is the provider or what is the name of the office in which the patient attends annual eye exams? Dr. TLindalou HoseIf pt is not established with a provider, would they like to be referred to a provider to establish care? No .   Dental Screening: Recommended annual dental exams for proper oral hygiene  Community Resource Referral / Chronic Care Management: CRR required this visit?  Yes   CCM required this visit?  No      Plan:     I have personally reviewed and noted the following in the patient's chart:   Medical and social history Use of alcohol, tobacco or illicit drugs  Current medications and supplements  including opioid prescriptions.  Functional ability and status Nutritional status Physical activity Advanced directives List of other physicians Hospitalizations, surgeries, and ER visits in previous 12 months Vitals Screenings to include cognitive, depression, and falls Referrals and appointments  In addition, I have reviewed and discussed with patient certain preventive protocols, quality metrics, and best practice  recommendations. A written personalized care plan for preventive services as well as general preventive health recommendations were provided to patient.     [place], [place]   04/28/2021   Nurse Notes: Non face to face 40 min.  Patient is in need of community resources for assistance with food, possibly housing and transportation. Patient also having increase depression and was agreeable to a referral to psychology/psychiatry. Placing a referral for mood to Psychology/psychiatry. Also placing referral for social worker to help obtain resources.     I have reviewed the health advisor's note, was available for consultation, and agree with documentation and plan  Lelon Huh, MD

## 2021-05-02 DIAGNOSIS — R7303 Prediabetes: Secondary | ICD-10-CM | POA: Diagnosis not present

## 2021-05-02 DIAGNOSIS — F411 Generalized anxiety disorder: Secondary | ICD-10-CM | POA: Diagnosis not present

## 2021-05-02 DIAGNOSIS — F5109 Other insomnia not due to a substance or known physiological condition: Secondary | ICD-10-CM | POA: Diagnosis not present

## 2021-05-02 DIAGNOSIS — F5081 Binge eating disorder: Secondary | ICD-10-CM | POA: Diagnosis not present

## 2021-05-02 DIAGNOSIS — F319 Bipolar disorder, unspecified: Secondary | ICD-10-CM | POA: Diagnosis not present

## 2021-05-02 DIAGNOSIS — E669 Obesity, unspecified: Secondary | ICD-10-CM | POA: Diagnosis not present

## 2021-05-02 DIAGNOSIS — R69 Illness, unspecified: Secondary | ICD-10-CM | POA: Diagnosis not present

## 2021-05-02 DIAGNOSIS — F59 Unspecified behavioral syndromes associated with physiological disturbances and physical factors: Secondary | ICD-10-CM | POA: Diagnosis not present

## 2021-05-02 DIAGNOSIS — Z713 Dietary counseling and surveillance: Secondary | ICD-10-CM | POA: Diagnosis not present

## 2021-05-02 DIAGNOSIS — F909 Attention-deficit hyperactivity disorder, unspecified type: Secondary | ICD-10-CM | POA: Diagnosis not present

## 2021-05-03 DIAGNOSIS — H35033 Hypertensive retinopathy, bilateral: Secondary | ICD-10-CM | POA: Diagnosis not present

## 2021-05-03 DIAGNOSIS — M5416 Radiculopathy, lumbar region: Secondary | ICD-10-CM | POA: Diagnosis not present

## 2021-05-03 DIAGNOSIS — M47816 Spondylosis without myelopathy or radiculopathy, lumbar region: Secondary | ICD-10-CM | POA: Diagnosis not present

## 2021-05-08 ENCOUNTER — Telehealth: Payer: Self-pay | Admitting: Physician Assistant

## 2021-05-08 DIAGNOSIS — J0140 Acute pansinusitis, unspecified: Secondary | ICD-10-CM

## 2021-05-08 NOTE — Telephone Encounter (Signed)
[facility] faxed refill request for the following medications:   lisinopril (ZESTRIL) 20 MG tablet ]\  metoprolol succinate (TOPROL-XL) 25 MG 24 hr tablet   fluticasone (FLONASE) 50 MCG/ACT nasal spray   Please advise.

## 2021-05-09 ENCOUNTER — Other Ambulatory Visit: Payer: Self-pay | Admitting: Family Medicine

## 2021-05-09 DIAGNOSIS — F909 Attention-deficit hyperactivity disorder, unspecified type: Secondary | ICD-10-CM | POA: Diagnosis not present

## 2021-05-09 DIAGNOSIS — F319 Bipolar disorder, unspecified: Secondary | ICD-10-CM | POA: Diagnosis not present

## 2021-05-09 DIAGNOSIS — F5109 Other insomnia not due to a substance or known physiological condition: Secondary | ICD-10-CM | POA: Diagnosis not present

## 2021-05-09 DIAGNOSIS — F411 Generalized anxiety disorder: Secondary | ICD-10-CM | POA: Diagnosis not present

## 2021-05-09 DIAGNOSIS — R69 Illness, unspecified: Secondary | ICD-10-CM | POA: Diagnosis not present

## 2021-05-09 DIAGNOSIS — Z713 Dietary counseling and surveillance: Secondary | ICD-10-CM | POA: Diagnosis not present

## 2021-05-09 DIAGNOSIS — F5081 Binge eating disorder: Secondary | ICD-10-CM | POA: Diagnosis not present

## 2021-05-09 DIAGNOSIS — F59 Unspecified behavioral syndromes associated with physiological disturbances and physical factors: Secondary | ICD-10-CM | POA: Diagnosis not present

## 2021-05-09 DIAGNOSIS — E669 Obesity, unspecified: Secondary | ICD-10-CM | POA: Diagnosis not present

## 2021-05-09 DIAGNOSIS — R7303 Prediabetes: Secondary | ICD-10-CM | POA: Diagnosis not present

## 2021-05-09 MED ORDER — LISINOPRIL 20 MG PO TABS: 20.0000 mg | ORAL_TABLET | Freq: Every day | ORAL | 0 refills | Status: DC

## 2021-05-09 MED ORDER — FLUTICASONE PROPIONATE 50 MCG/ACT NA SUSP: NASAL | 1 refills | Status: DC

## 2021-05-09 NOTE — Telephone Encounter (Signed)
Will refill lisinopril which was last prescribed 8/15/ with qty of 100 and Fluticasone which was last filled 12/20/20. Metoprolol last filled 03/28/21 with 90qty with 3 refills sent to [place].

## 2021-05-10 ENCOUNTER — Telehealth: Payer: Self-pay

## 2021-05-10 NOTE — Telephone Encounter (Signed)
Copied from [place] [id number]. Topic: Referral - Status >> May 09, 2021 10:31 AM Alanda Slim E wrote: Reason for CRM: Johnathan from [facility] called to let office know they have received a referral but dont have any phychiatrist taking new patients / please advise

## 2021-05-11 ENCOUNTER — Other Ambulatory Visit: Payer: Self-pay | Admitting: Family Medicine

## 2021-05-11 DIAGNOSIS — F339 Major depressive disorder, recurrent, unspecified: Secondary | ICD-10-CM

## 2021-05-12 ENCOUNTER — Other Ambulatory Visit: Payer: Self-pay

## 2021-05-12 ENCOUNTER — Encounter: Payer: Self-pay | Admitting: Family Medicine

## 2021-05-12 ENCOUNTER — Ambulatory Visit (INDEPENDENT_AMBULATORY_CARE_PROVIDER_SITE_OTHER): Payer: Medicare HMO | Admitting: Family Medicine

## 2021-05-12 VITALS — BP 126/91 | HR 73 | Temp 97.8°F | Resp 16 | Ht 67.0 in | Wt 256.0 lb

## 2021-05-12 DIAGNOSIS — Z23 Encounter for immunization: Secondary | ICD-10-CM | POA: Insufficient documentation

## 2021-05-12 DIAGNOSIS — R809 Proteinuria, unspecified: Secondary | ICD-10-CM | POA: Diagnosis not present

## 2021-05-12 DIAGNOSIS — E034 Atrophy of thyroid (acquired): Secondary | ICD-10-CM | POA: Insufficient documentation

## 2021-05-12 DIAGNOSIS — Z6841 Body Mass Index (BMI) 40.0 and over, adult: Secondary | ICD-10-CM | POA: Insufficient documentation

## 2021-05-12 DIAGNOSIS — Z713 Dietary counseling and surveillance: Secondary | ICD-10-CM | POA: Insufficient documentation

## 2021-05-12 DIAGNOSIS — I152 Hypertension secondary to endocrine disorders: Secondary | ICD-10-CM

## 2021-05-12 DIAGNOSIS — F339 Major depressive disorder, recurrent, unspecified: Secondary | ICD-10-CM | POA: Diagnosis not present

## 2021-05-12 DIAGNOSIS — E1159 Type 2 diabetes mellitus with other circulatory complications: Secondary | ICD-10-CM | POA: Diagnosis not present

## 2021-05-12 DIAGNOSIS — E1129 Type 2 diabetes mellitus with other diabetic kidney complication: Secondary | ICD-10-CM

## 2021-05-12 DIAGNOSIS — R69 Illness, unspecified: Secondary | ICD-10-CM | POA: Diagnosis not present

## 2021-05-12 LAB — POCT GLYCOSYLATED HEMOGLOBIN (HGB A1C)
Est. average glucose Bld gHb Est-mCnc: 128
Hemoglobin A1C: 6.1 % — AB (ref 4.0–5.6)

## 2021-05-12 MED ORDER — PHENTERMINE HCL 37.5 MG PO CAPS: 37.5000 mg | ORAL_CAPSULE | ORAL | 0 refills | Status: DC

## 2021-05-12 MED ORDER — TIRZEPATIDE 2.5 MG/0.5ML ~~LOC~~ SOAJ: 2.5000 mg | SUBCUTANEOUS | 0 refills | Status: DC

## 2021-05-12 MED ORDER — LEVOTHYROXINE SODIUM 25 MCG PO TABS: ORAL_TABLET | ORAL | 3 refills | Status: DC

## 2021-05-12 NOTE — Progress Notes (Signed)
I,[name],acting as a scribe for Gwyneth Sprout, FNP.,have documented all relevant documentation on the behalf of Gwyneth Sprout, FNP,as directed by  Gwyneth Sprout, FNP while in the presence of Gwyneth Sprout, FNP.   Established patient visit   Patient: Barbara Anderson   DOB: 1970/10/13   50 y.o. Female  MRN: [id number] Visit Date: 05/12/2021  Today's healthcare provider: Gwyneth Sprout, FNP   Chief Complaint  Patient presents with   Follow-up   Hypertension   Diabetes   Subjective    HPI  Diabetes Mellitus Type II, follow-up  Lab Results  Component Value Date   HGBA1C 6.1 (A) 05/12/2021   HGBA1C 6.3 (H) 01/24/2021   HGBA1C 5.9 (H) 02/04/2020   Last seen for diabetes 3 months ago.  Management since then includes continuing the same treatment. She reports good compliance with treatment. She is not having side effects. none  Home blood sugar records: fasting range: check occasionally  Episodes of hypoglycemia? No; none   Current insulin regiment: n/a Most Recent Eye Exam: 04/2021  --------------------------------------------------------------------------------------------------- Hypertension, follow-up  BP Readings from Last 3 Encounters:  05/12/21 (!) 126/91  03/28/21 130/70  02/20/21 (!) 146/100   Wt Readings from Last 3 Encounters:  05/12/21 256 lb (116.1 kg)  03/28/21 260 lb 12.8 oz (118.3 kg)  02/20/21 246 lb (111.6 kg)     She was last seen for hypertension 2   ago.  BP at that visit was 130/70. Management since that visit includes; Chart metoprolol low-dose.  She reports good compliance with treatment. She is not having side effects. none She is not exercising. She is not adherent to low salt diet.   Outside blood pressures are not checking.  She does not smoke.  Use of agents associated with hypertension: none.   ------------------------------------------------------------------  Medications: Outpatient Medications Prior to Visit   Medication Sig   Adapalene (DIFFERIN) 0.3 % gel Apply 1 application topically at bedtime.   Alcohol Swabs (B-D SINGLE USE SWABS REGULAR) PADS USE TO CLEANSE SKIN EVERY DAY BEFORE CHECKING BLOOD SUGAR   ALPRAZolam (XANAX) 1 MG tablet Take 1 mg by mouth 2 (two) times daily as needed.    amphetamine-dextroamphetamine (ADDERALL) 20 MG tablet Take 1 tablet (20 mg total) by mouth in the morning, at noon, and at bedtime. Has not started yet 11/10/19   ARIPiprazole (ABILIFY) 5 MG tablet Take 5 mg by mouth daily.   atorvastatin (LIPITOR) 20 MG tablet Take 1 tablet (20 mg total) by mouth daily.   Cholecalciferol (VITAMIN D3) 25 MCG (1000 UT) CAPS Take 1 capsule (1,000 Units total) by mouth daily.   Cholecalciferol 125 MCG (5000 UT) TABS Take by mouth.   DULoxetine (CYMBALTA) 60 MG capsule Take 2 capsules (120 mg total) by mouth daily.   DULoxetine (CYMBALTA) 60 MG capsule Take by mouth.   ferrous sulfate 325 (65 FE) MG tablet Take by mouth.   fluconazole (DIFLUCAN) 150 MG tablet    fluticasone (FLONASE) 50 MCG/ACT nasal spray USE 2 SPRAYS IN BOTH  NOSTRILS DAILY   folic acid (FOLVITE) 299 MCG tablet Take 1 tablet (800 mcg total) by mouth daily.   Lancets Misc. (ACCU-CHEK FASTCLIX LANCET) KIT To check blood sugar once daily   lisinopril (ZESTRIL) 20 MG tablet Take 1 tablet (20 mg total) by mouth daily.   metoprolol succinate (TOPROL-XL) 25 MG 24 hr tablet Take 1 tablet (25 mg total) by mouth daily.   pregabalin (LYRICA) 200 MG capsule  TAKE 1 CAPSULE BY MOUTH  TWICE DAILY   QUEtiapine Fumarate (SEROQUEL XR) 150 MG 24 hr tablet Take 150 mg by mouth at bedtime.    spironolactone (ALDACTONE) 25 MG tablet Take 1 tablet (25 mg total) by mouth daily.   tiZANidine (ZANAFLEX) 2 MG tablet Take 2 mg by mouth 3 (three) times daily.   topiramate (TOPAMAX) 200 MG tablet Take 1 tablet (200 mg total) by mouth at bedtime.   traZODone (DESYREL) 150 MG tablet Take 1 tablet (150 mg total) by mouth at bedtime.   VRAYLAR  1.5 MG capsule Take 1.5 mg by mouth daily.   [DISCONTINUED] levothyroxine (SYNTHROID) 25 MCG tablet TAKE 1 TABLET BY MOUTH  DAILY BEFORE BREAKFAST   No facility-administered medications prior to visit.    Review of Systems     Objective    BP (!) 126/91 (BP Location: Right Arm, Patient Position: Sitting, Cuff Size: Large)   Pulse 73   Temp 97.8 F (36.6 C) (Temporal)   Resp 16   Ht '5\' 7"'  (1.702 m)   Wt 256 lb (116.1 kg)   SpO2 98%   BMI 40.10 kg/m  {Show previous vital signs (optional):23777}  Physical Exam Vitals and nursing note reviewed.  Constitutional:      General: She is not in acute distress.    Appearance: Normal appearance. She is obese. She is not ill-appearing, toxic-appearing or diaphoretic.  HENT:     Head: Normocephalic and atraumatic.     Comments: Use of room darkening glasses s/s photophobia  Cardiovascular:     Rate and Rhythm: Normal rate and regular rhythm.     Pulses: Normal pulses.     Heart sounds: Normal heart sounds. No murmur heard.   No friction rub. No gallop.  Pulmonary:     Effort: Pulmonary effort is normal. No respiratory distress.     Breath sounds: Normal breath sounds. No stridor. No wheezing, rhonchi or rales.  Chest:     Chest wall: No tenderness.  Abdominal:     General: Bowel sounds are normal.     Palpations: Abdomen is soft.  Musculoskeletal:        General: No swelling, tenderness, deformity or signs of injury. Normal range of motion.     Right lower leg: No edema.     Left lower leg: No edema.     Comments: Use of cane s/s TBI  Skin:    General: Skin is warm and dry.     Capillary Refill: Capillary refill takes less than 2 seconds.     Coloration: Skin is not jaundiced or pale.     Findings: No bruising, erythema, lesion or rash.  Neurological:     General: No focal deficit present.     Mental Status: She is alert and oriented to person, place, and time. Mental status is at baseline.     Cranial Nerves: No cranial  nerve deficit.     Sensory: No sensory deficit.     Motor: No weakness.     Coordination: Coordination normal.  Psychiatric:        Mood and Affect: Mood normal.        Behavior: Behavior normal.        Thought Content: Thought content normal.        Judgment: Judgment normal.    Results for orders placed or performed in visit on 05/12/21  POCT glycosylated hemoglobin (Hb A1C)  Result Value Ref Range   Hemoglobin A1C 6.1 (A) 4.0 -  5.6 %   Est. average glucose Bld gHb Est-mCnc 128     Assessment & Plan     Problem List Items Addressed This Visit       Cardiovascular and Mediastinum   Hypertension associated with diabetes ([place])    Chronic, stable No current concerns       Relevant Medications   tirzepatide (MOUNJARO) 2.5 MG/0.5ML Pen     Endocrine   Type 2 diabetes mellitus with microalbuminuria, without long-term current use of insulin (HCC) - Primary    Congratulated on a1c % Trial of mounjaro to assist with weight loss      Relevant Medications   tirzepatide (MOUNJARO) 2.5 MG/0.5ML Pen   Other Relevant Orders   POCT glycosylated hemoglobin (Hb A1C) (Completed)   Amb Referral to Bariatric Surgery   Hypothyroidism due to acquired atrophy of thyroid    Pulled for medication refill Noted weight difficult to lose s/s hypothyroidism       Relevant Medications   levothyroxine (SYNTHROID) 25 MCG tablet   Other Relevant Orders   Amb Referral to Bariatric Surgery     Other   Need for influenza vaccination    Provided       Relevant Orders   Flu Vaccine QUAD 17moIM (Fluarix, Fluzone & Alfiuria Quad PF) (Completed)   Morbid obesity (HCC)    BMI 40 -htn -dm Discussed importance of healthy weight management Discussed diet and exercise       Relevant Medications   tirzepatide (MOUNJARO) 2.5 MG/0.5ML Pen   phentermine 37.5 MG capsule   Other Relevant Orders   Amb Referral to Bariatric Surgery   Depression, recurrent ([place]    Ups/down Stable per pt  report Denies SI/HI      Relevant Orders   Amb Referral to Bariatric Surgery   Weight loss counseling, encounter for    Pt asked for referral for possible surgical intervention Medication to assist in meantime      BMI 40.0-44.9, adult (HCC)   Relevant Medications   tirzepatide (MOUNJARO) 2.5 MG/0.5ML Pen   phentermine 37.5 MG capsule   Other Relevant Orders   Amb Referral to Bariatric Surgery     Return in about 6 weeks (around 06/23/2021) for T2DM management, weight loss.      IVonna Kotyk FNP, have reviewed all documentation for this visit. The documentation on 05/12/21 for the exam, diagnosis, procedures, and orders are all accurate and complete.    EGwyneth Sprout [phone](phone) [phone](fax)  [place]

## 2021-05-12 NOTE — Assessment & Plan Note (Signed)
Pulled for medication refill Noted weight difficult to lose s/s hypothyroidism

## 2021-05-12 NOTE — Assessment & Plan Note (Signed)
Ups/down Stable per pt report Denies SI/HI

## 2021-05-12 NOTE — Assessment & Plan Note (Signed)
Provided

## 2021-05-12 NOTE — Assessment & Plan Note (Signed)
Congratulated on a1c % Trial of mounjaro to assist with weight loss

## 2021-05-12 NOTE — Assessment & Plan Note (Signed)
Chronic, stable No current concerns

## 2021-05-12 NOTE — Assessment & Plan Note (Signed)
Pt asked for referral for possible surgical intervention Medication to assist in meantime

## 2021-05-12 NOTE — Assessment & Plan Note (Signed)
BMI 40 -htn -dm Discussed importance of healthy weight management Discussed diet and exercise

## 2021-05-15 ENCOUNTER — Telehealth: Payer: Self-pay

## 2021-05-15 NOTE — Telephone Encounter (Signed)
Copied from [place] [phone]. Topic: General - Other >> May 15, 2021 10:40 AM Bayard Beaver wrote: Reason for AGT:[id number] says waiting for status of paperwork faxed for authorization of med phentermine 37.5 MG capsule . Please call back

## 2021-05-16 ENCOUNTER — Telehealth: Payer: Self-pay

## 2021-05-16 ENCOUNTER — Ambulatory Visit (INDEPENDENT_AMBULATORY_CARE_PROVIDER_SITE_OTHER): Payer: Medicare HMO | Admitting: Psychologist

## 2021-05-16 DIAGNOSIS — R69 Illness, unspecified: Secondary | ICD-10-CM | POA: Diagnosis not present

## 2021-05-16 DIAGNOSIS — E669 Obesity, unspecified: Secondary | ICD-10-CM | POA: Diagnosis not present

## 2021-05-16 DIAGNOSIS — F331 Major depressive disorder, recurrent, moderate: Secondary | ICD-10-CM

## 2021-05-16 DIAGNOSIS — F411 Generalized anxiety disorder: Secondary | ICD-10-CM | POA: Diagnosis not present

## 2021-05-16 DIAGNOSIS — Z713 Dietary counseling and surveillance: Secondary | ICD-10-CM | POA: Diagnosis not present

## 2021-05-16 DIAGNOSIS — F319 Bipolar disorder, unspecified: Secondary | ICD-10-CM | POA: Diagnosis not present

## 2021-05-16 DIAGNOSIS — F59 Unspecified behavioral syndromes associated with physiological disturbances and physical factors: Secondary | ICD-10-CM | POA: Diagnosis not present

## 2021-05-16 DIAGNOSIS — F909 Attention-deficit hyperactivity disorder, unspecified type: Secondary | ICD-10-CM | POA: Diagnosis not present

## 2021-05-16 DIAGNOSIS — F5081 Binge eating disorder: Secondary | ICD-10-CM | POA: Diagnosis not present

## 2021-05-16 DIAGNOSIS — F5109 Other insomnia not due to a substance or known physiological condition: Secondary | ICD-10-CM | POA: Diagnosis not present

## 2021-05-16 DIAGNOSIS — R7303 Prediabetes: Secondary | ICD-10-CM | POA: Diagnosis not present

## 2021-05-16 NOTE — Progress Notes (Signed)
Chronic Care Management Pharmacy Assistant   Name: Barbara Anderson  MRN: [id number] DOB: [birth year]  Reason for Encounter:Diabetes Disease State Call.   Recent office visits:  05/12/2021 [name] FNP (PCP Office) start tirzepatide [facility]) 2.5 MG/0.5ML Pen,start phentermine 37.5 MG capsule,Amb Referral to Bariatric Surgery 04/28/2021 Anders Simmonds CMA (PCP Office) Medicare annual wellness completed, Referral to social work,Psychiatry, and Psychology.  Recent consult visits:  05/03/2021  Dr. Hardin Negus MD (Pain Medicine) follow up in 6 months 04/26/2021 Dr. Hardin Negus MD (Pain Medicine) No Medication Changes noted  Hospital visits:  None in previous 6 months  Medications: Outpatient Encounter Medications as of 05/16/2021  Medication Sig   Adapalene (DIFFERIN) 0.3 % gel Apply 1 application topically at bedtime.   Alcohol Swabs (B-D SINGLE USE SWABS REGULAR) PADS USE TO CLEANSE SKIN EVERY DAY BEFORE CHECKING BLOOD SUGAR   ALPRAZolam (XANAX) 1 MG tablet Take 1 mg by mouth 2 (two) times daily as needed.    amphetamine-dextroamphetamine (ADDERALL) 20 MG tablet Take 1 tablet (20 mg total) by mouth in the morning, at noon, and at bedtime. Has not started yet 11/10/19   ARIPiprazole (ABILIFY) 5 MG tablet Take 5 mg by mouth daily.   atorvastatin (LIPITOR) 20 MG tablet Take 1 tablet (20 mg total) by mouth daily.   Cholecalciferol (VITAMIN D3) 25 MCG (1000 UT) CAPS Take 1 capsule (1,000 Units total) by mouth daily.   Cholecalciferol 125 MCG (5000 UT) TABS Take by mouth.   DULoxetine (CYMBALTA) 60 MG capsule Take 2 capsules (120 mg total) by mouth daily.   DULoxetine (CYMBALTA) 60 MG capsule Take by mouth.   ferrous sulfate 325 (65 FE) MG tablet Take by mouth.   fluconazole (DIFLUCAN) 150 MG tablet    fluticasone (FLONASE) 50 MCG/ACT nasal spray USE 2 SPRAYS IN BOTH  NOSTRILS DAILY   folic acid (FOLVITE) 937 MCG tablet Take 1 tablet (800 mcg total) by mouth daily.   Lancets Misc.  (ACCU-CHEK FASTCLIX LANCET) KIT To check blood sugar once daily   levothyroxine (SYNTHROID) 25 MCG tablet TAKE 1 TABLET BY MOUTH  DAILY BEFORE BREAKFAST   lisinopril (ZESTRIL) 20 MG tablet Take 1 tablet (20 mg total) by mouth daily.   metoprolol succinate (TOPROL-XL) 25 MG 24 hr tablet Take 1 tablet (25 mg total) by mouth daily.   phentermine 37.5 MG capsule Take 1 capsule (37.5 mg total) by mouth every morning.   pregabalin (LYRICA) 200 MG capsule TAKE 1 CAPSULE BY MOUTH  TWICE DAILY   QUEtiapine Fumarate (SEROQUEL XR) 150 MG 24 hr tablet Take 150 mg by mouth at bedtime.    spironolactone (ALDACTONE) 25 MG tablet Take 1 tablet (25 mg total) by mouth daily.   tirzepatide [facility]) 2.5 MG/0.5ML Pen Inject 2.5 mg into the skin once a week.   tiZANidine (ZANAFLEX) 2 MG tablet Take 2 mg by mouth 3 (three) times daily.   topiramate (TOPAMAX) 200 MG tablet Take 1 tablet (200 mg total) by mouth at bedtime.   traZODone (DESYREL) 150 MG tablet Take 1 tablet (150 mg total) by mouth at bedtime.   VRAYLAR 1.5 MG capsule Take 1.5 mg by mouth daily.   No facility-administered encounter medications on file as of 05/16/2021.    Care Gaps: JIRCV-[id number] Vaccine Ophthalmology Exam (Last Completed 01/28/2019) Shingrix Vaccine HTN: 126/91 on 05/12/2021 Star Rating Drugs: Lisinopril 20 mg las filled 04/29/2021 100 day supply at [facility]. Atorvastatin 20 mg last filled 11/26/2020 90 day supply at [facility] Medication Fill gaps:  Aripiprazole  5 MG last filled 12/09/2020 30 day supply  Recent Relevant Labs: Lab Results  Component Value Date/Time   HGBA1C 6.1 (A) 05/12/2021 03:36 PM   HGBA1C 6.3 (H) 01/24/2021 09:13 AM   HGBA1C 5.9 (H) 02/04/2020 02:58 PM   MICROALBUR 20 02/04/2020 02:46 PM   MICROALBUR 50 07/21/2018 04:34 PM    Kidney Function Lab Results  Component Value Date/Time   CREATININE 0.93 03/14/2021 01:40 PM   CREATININE 1.10 (H) 01/24/2021 09:13 AM   CREATININE 0.88 05/12/2014  07:47 PM   CREATININE 0.79 04/22/2014 03:03 PM   CREATININE 1.03 10/21/2013 08:28 PM   PIOPPUGG 16 02/04/2020 02:58 PM   GFRNONAA >60 10/21/2013 08:28 PM   GFRAA 76 02/04/2020 02:58 PM   GFRAA >60 10/21/2013 08:28 PM    Current antihyperglycemic regimen:  None What recent interventions/DTPs have been made to improve glycemic control:  None  Have there been any recent hospitalizations or ED visits since last visit with CPP? No Patient denies hypoglycemic symptoms, including Pale, Sweaty, Shaky, Hungry, Nervous/irritable, and Vision changes Patient denies hyperglycemic symptoms, including blurry vision, excessive thirst, fatigue, polyuria, and weakness How often are you checking your blood sugar? Patient states she checks her blood sugar a few times a week. What are your blood sugars ranging?  Patient  reports her blood sugars has been ranging from 92-100. During the week, how often does your blood glucose drop below 70? Never Are you checking your feet daily/regularly?   Patient states she checks her feet daily, and denies any  numbness,pain or tingling sensation in her feet.  Adherence Review: Is the patient currently on a STATIN medication? Yes Is the patient currently on ACE/ARB medication? Yes Does the patient have >5 day gap between last estimated fill dates? Yes   [place] Pharmacist Assistant [phone]

## 2021-05-18 ENCOUNTER — Other Ambulatory Visit: Payer: Self-pay | Admitting: Family Medicine

## 2021-05-18 ENCOUNTER — Telehealth: Payer: Self-pay

## 2021-05-18 NOTE — Telephone Encounter (Signed)
Please review. KW 

## 2021-05-18 NOTE — Telephone Encounter (Signed)
Copied from [place] [id number]. Topic: General - Other >> May 18, 2021 12:58 PM Tessa Lerner A wrote: Reason for CRM: The patient has been denied coverage of their phentermine 37.5 MG capsule [[id number]]  prescription and would like to know if it is possible to have an alternative prescribed by Prov. Payne  Please contact further when possible

## 2021-05-19 ENCOUNTER — Other Ambulatory Visit: Payer: Self-pay | Admitting: Family Medicine

## 2021-05-19 DIAGNOSIS — E1129 Type 2 diabetes mellitus with other diabetic kidney complication: Secondary | ICD-10-CM

## 2021-05-19 NOTE — Telephone Encounter (Signed)
Spoke with patient she states that she already spoke with her insurance and did not tell her of alternative drug and told her to contact PCP for an alternative. Please advise. KW

## 2021-05-22 ENCOUNTER — Other Ambulatory Visit: Payer: Self-pay | Admitting: Family Medicine

## 2021-05-22 DIAGNOSIS — R809 Proteinuria, unspecified: Secondary | ICD-10-CM

## 2021-05-22 DIAGNOSIS — E1129 Type 2 diabetes mellitus with other diabetic kidney complication: Secondary | ICD-10-CM

## 2021-05-22 MED ORDER — OZEMPIC (0.25 OR 0.5 MG/DOSE) 2 MG/1.5ML ~~LOC~~ SOPN: 0.2500 mg | PEN_INJECTOR | SUBCUTANEOUS | 0 refills | Status: DC

## 2021-05-22 MED ORDER — SEMAGLUTIDE (2 MG/DOSE) 8 MG/3ML ~~LOC~~ SOPN: 0.2500 mg | PEN_INJECTOR | SUBCUTANEOUS | 0 refills | Status: DC

## 2021-05-22 NOTE — Telephone Encounter (Signed)
Contacted patient and advised her as below, she states that she never new of Mounjaro being prescribed on 05/12/21. I contacted pharmacy to clarify if they received prescription pharmacist stated that they did but insurance would not cover. Pharmacist states that insurance will cover for patient Ozempic or Trulicity. Please advise if you would like to change to a preferred drug or do you want me to initiate pre-authorization? KW

## 2021-05-22 NOTE — Telephone Encounter (Signed)
Just to clarify will this medication help the same as phentermine? When I advise patient. KW

## 2021-05-23 DIAGNOSIS — R7303 Prediabetes: Secondary | ICD-10-CM | POA: Diagnosis not present

## 2021-05-23 DIAGNOSIS — Z713 Dietary counseling and surveillance: Secondary | ICD-10-CM | POA: Diagnosis not present

## 2021-05-23 DIAGNOSIS — E669 Obesity, unspecified: Secondary | ICD-10-CM | POA: Diagnosis not present

## 2021-05-23 DIAGNOSIS — F411 Generalized anxiety disorder: Secondary | ICD-10-CM | POA: Diagnosis not present

## 2021-05-23 DIAGNOSIS — F5109 Other insomnia not due to a substance or known physiological condition: Secondary | ICD-10-CM | POA: Diagnosis not present

## 2021-05-23 DIAGNOSIS — F59 Unspecified behavioral syndromes associated with physiological disturbances and physical factors: Secondary | ICD-10-CM | POA: Diagnosis not present

## 2021-05-23 DIAGNOSIS — F319 Bipolar disorder, unspecified: Secondary | ICD-10-CM | POA: Diagnosis not present

## 2021-05-23 DIAGNOSIS — R69 Illness, unspecified: Secondary | ICD-10-CM | POA: Diagnosis not present

## 2021-05-23 DIAGNOSIS — F5081 Binge eating disorder: Secondary | ICD-10-CM | POA: Diagnosis not present

## 2021-05-23 DIAGNOSIS — F909 Attention-deficit hyperactivity disorder, unspecified type: Secondary | ICD-10-CM | POA: Diagnosis not present

## 2021-05-23 NOTE — Telephone Encounter (Signed)
Will start pre-authorization on Mounjaro. KW

## 2021-05-30 DIAGNOSIS — F411 Generalized anxiety disorder: Secondary | ICD-10-CM | POA: Diagnosis not present

## 2021-05-30 DIAGNOSIS — F909 Attention-deficit hyperactivity disorder, unspecified type: Secondary | ICD-10-CM | POA: Diagnosis not present

## 2021-05-30 DIAGNOSIS — F5109 Other insomnia not due to a substance or known physiological condition: Secondary | ICD-10-CM | POA: Diagnosis not present

## 2021-05-30 DIAGNOSIS — Z713 Dietary counseling and surveillance: Secondary | ICD-10-CM | POA: Diagnosis not present

## 2021-05-30 DIAGNOSIS — F5089 Other specified eating disorder: Secondary | ICD-10-CM | POA: Diagnosis not present

## 2021-05-30 DIAGNOSIS — F59 Unspecified behavioral syndromes associated with physiological disturbances and physical factors: Secondary | ICD-10-CM | POA: Diagnosis not present

## 2021-05-30 DIAGNOSIS — F5081 Binge eating disorder: Secondary | ICD-10-CM | POA: Diagnosis not present

## 2021-05-30 DIAGNOSIS — R7303 Prediabetes: Secondary | ICD-10-CM | POA: Diagnosis not present

## 2021-05-30 DIAGNOSIS — F319 Bipolar disorder, unspecified: Secondary | ICD-10-CM | POA: Diagnosis not present

## 2021-05-30 DIAGNOSIS — E669 Obesity, unspecified: Secondary | ICD-10-CM | POA: Diagnosis not present

## 2021-05-30 DIAGNOSIS — R69 Illness, unspecified: Secondary | ICD-10-CM | POA: Diagnosis not present

## 2021-05-31 ENCOUNTER — Ambulatory Visit: Payer: Medicare HMO | Admitting: Psychologist

## 2021-06-01 ENCOUNTER — Ambulatory Visit: Payer: Medicare HMO | Admitting: *Deleted

## 2021-06-02 ENCOUNTER — Other Ambulatory Visit: Payer: Self-pay | Admitting: Family Medicine

## 2021-06-02 MED ORDER — LISINOPRIL 20 MG PO TABS
20.0000 mg | ORAL_TABLET | Freq: Every day | ORAL | 0 refills | Status: DC
Start: 2021-06-02 — End: 2022-03-19

## 2021-06-02 NOTE — Telephone Encounter (Signed)
Medication Refill - Medication: Lisinopril   Has the patient contacted their pharmacy? Yes.   PT states that she was advised to contact PCP for refill. Please advise.  (Agent: If no, request that the patient contact the pharmacy for the refill. If patient does not wish to contact the pharmacy document the reason why and proceed with request.) (Agent: If yes, when and what did the pharmacy advise?)  Preferred Pharmacy (with phone number or street name):  [place], [place] - [address]  [place] [address]  Phone: [phone] Fax: [phone]  Hours: Not open 24 hours   Has the patient been seen for an appointment in the last year OR does the patient have an upcoming appointment? Yes.    Agent: Please be advised that RX refills may take up to 3 business days. We ask that you follow-up with your pharmacy.

## 2021-06-06 ENCOUNTER — Telehealth: Payer: Self-pay

## 2021-06-06 DIAGNOSIS — F5109 Other insomnia not due to a substance or known physiological condition: Secondary | ICD-10-CM | POA: Diagnosis not present

## 2021-06-06 DIAGNOSIS — E669 Obesity, unspecified: Secondary | ICD-10-CM | POA: Diagnosis not present

## 2021-06-06 DIAGNOSIS — F59 Unspecified behavioral syndromes associated with physiological disturbances and physical factors: Secondary | ICD-10-CM | POA: Diagnosis not present

## 2021-06-06 DIAGNOSIS — F909 Attention-deficit hyperactivity disorder, unspecified type: Secondary | ICD-10-CM | POA: Diagnosis not present

## 2021-06-06 DIAGNOSIS — F5081 Binge eating disorder: Secondary | ICD-10-CM | POA: Diagnosis not present

## 2021-06-06 DIAGNOSIS — R69 Illness, unspecified: Secondary | ICD-10-CM | POA: Diagnosis not present

## 2021-06-06 DIAGNOSIS — Z713 Dietary counseling and surveillance: Secondary | ICD-10-CM | POA: Diagnosis not present

## 2021-06-06 DIAGNOSIS — F319 Bipolar disorder, unspecified: Secondary | ICD-10-CM | POA: Diagnosis not present

## 2021-06-06 DIAGNOSIS — F5089 Other specified eating disorder: Secondary | ICD-10-CM | POA: Diagnosis not present

## 2021-06-06 DIAGNOSIS — R7303 Prediabetes: Secondary | ICD-10-CM | POA: Diagnosis not present

## 2021-06-06 DIAGNOSIS — F411 Generalized anxiety disorder: Secondary | ICD-10-CM | POA: Diagnosis not present

## 2021-06-06 NOTE — Telephone Encounter (Signed)
Left message for patient to pick up form for disability license plate form.

## 2021-06-13 DIAGNOSIS — F411 Generalized anxiety disorder: Secondary | ICD-10-CM | POA: Diagnosis not present

## 2021-06-13 DIAGNOSIS — F909 Attention-deficit hyperactivity disorder, unspecified type: Secondary | ICD-10-CM | POA: Diagnosis not present

## 2021-06-13 DIAGNOSIS — F319 Bipolar disorder, unspecified: Secondary | ICD-10-CM | POA: Diagnosis not present

## 2021-06-13 DIAGNOSIS — Z713 Dietary counseling and surveillance: Secondary | ICD-10-CM | POA: Diagnosis not present

## 2021-06-13 DIAGNOSIS — F5089 Other specified eating disorder: Secondary | ICD-10-CM | POA: Diagnosis not present

## 2021-06-13 DIAGNOSIS — R69 Illness, unspecified: Secondary | ICD-10-CM | POA: Diagnosis not present

## 2021-06-13 DIAGNOSIS — R7303 Prediabetes: Secondary | ICD-10-CM | POA: Diagnosis not present

## 2021-06-13 DIAGNOSIS — E669 Obesity, unspecified: Secondary | ICD-10-CM | POA: Diagnosis not present

## 2021-06-13 DIAGNOSIS — F5081 Binge eating disorder: Secondary | ICD-10-CM | POA: Diagnosis not present

## 2021-06-13 DIAGNOSIS — F59 Unspecified behavioral syndromes associated with physiological disturbances and physical factors: Secondary | ICD-10-CM | POA: Diagnosis not present

## 2021-06-13 DIAGNOSIS — F5109 Other insomnia not due to a substance or known physiological condition: Secondary | ICD-10-CM | POA: Diagnosis not present

## 2021-06-20 DIAGNOSIS — Z0100 Encounter for examination of eyes and vision without abnormal findings: Secondary | ICD-10-CM | POA: Diagnosis not present

## 2021-06-20 DIAGNOSIS — H524 Presbyopia: Secondary | ICD-10-CM | POA: Diagnosis not present

## 2021-06-28 ENCOUNTER — Other Ambulatory Visit: Payer: Self-pay | Admitting: Family Medicine

## 2021-06-28 DIAGNOSIS — E1129 Type 2 diabetes mellitus with other diabetic kidney complication: Secondary | ICD-10-CM

## 2021-07-12 ENCOUNTER — Other Ambulatory Visit: Payer: Self-pay | Admitting: Family Medicine

## 2021-07-12 DIAGNOSIS — L700 Acne vulgaris: Secondary | ICD-10-CM

## 2021-07-14 ENCOUNTER — Ambulatory Visit: Payer: Medicare HMO | Admitting: Obstetrics and Gynecology

## 2021-07-21 ENCOUNTER — Ambulatory Visit: Payer: Medicare HMO | Admitting: Obstetrics and Gynecology

## 2021-07-25 ENCOUNTER — Ambulatory Visit: Payer: Medicare HMO | Admitting: Family Medicine

## 2021-07-26 ENCOUNTER — Encounter: Payer: Self-pay | Admitting: Family Medicine

## 2021-07-26 ENCOUNTER — Ambulatory Visit (INDEPENDENT_AMBULATORY_CARE_PROVIDER_SITE_OTHER): Payer: Medicare HMO | Admitting: Family Medicine

## 2021-07-26 ENCOUNTER — Other Ambulatory Visit: Payer: Self-pay

## 2021-07-26 DIAGNOSIS — E1159 Type 2 diabetes mellitus with other circulatory complications: Secondary | ICD-10-CM

## 2021-07-26 DIAGNOSIS — M5441 Lumbago with sciatica, right side: Secondary | ICD-10-CM | POA: Diagnosis not present

## 2021-07-26 DIAGNOSIS — E1129 Type 2 diabetes mellitus with other diabetic kidney complication: Secondary | ICD-10-CM | POA: Diagnosis not present

## 2021-07-26 DIAGNOSIS — Z9989 Dependence on other enabling machines and devices: Secondary | ICD-10-CM | POA: Diagnosis not present

## 2021-07-26 DIAGNOSIS — Z23 Encounter for immunization: Secondary | ICD-10-CM | POA: Diagnosis not present

## 2021-07-26 DIAGNOSIS — M62838 Other muscle spasm: Secondary | ICD-10-CM | POA: Insufficient documentation

## 2021-07-26 DIAGNOSIS — R809 Proteinuria, unspecified: Secondary | ICD-10-CM

## 2021-07-26 DIAGNOSIS — G8929 Other chronic pain: Secondary | ICD-10-CM

## 2021-07-26 DIAGNOSIS — G43819 Other migraine, intractable, without status migrainosus: Secondary | ICD-10-CM | POA: Diagnosis not present

## 2021-07-26 DIAGNOSIS — I152 Hypertension secondary to endocrine disorders: Secondary | ICD-10-CM

## 2021-07-26 MED ORDER — SEMAGLUTIDE (1 MG/DOSE) 4 MG/3ML ~~LOC~~ SOPN: 1.0000 mg | PEN_INJECTOR | SUBCUTANEOUS | 0 refills | Status: DC

## 2021-07-26 MED ORDER — TIZANIDINE HCL 2 MG PO TABS: 2.0000 mg | ORAL_TABLET | Freq: Three times a day (TID) | ORAL | 1 refills | Status: AC | PRN

## 2021-07-26 NOTE — Assessment & Plan Note (Signed)
Prn muscle relaxant refills Encouraged stretching and hydration

## 2021-07-26 NOTE — Assessment & Plan Note (Signed)
Ongoing obesity issues Weight up today- not mentioned given pt's birthdate

## 2021-07-26 NOTE — Progress Notes (Signed)
°  ° ° °Established patient visit ° ° °Patient: Barbara Anderson   DOB: 07/22/1971   50 y.o. Female  MRN: [id number] °Visit Date: 07/26/2021 ° °Today's healthcare provider:  T , FNP  ° °Chief Complaint  °Patient presents with  ° Diabetes  ° Obesity  ° °I,Sulibeya S Dimas,acting as a scribe for  T , FNP.,have documented all relevant documentation on the behalf of  T , FNP,as directed by   T , FNP while in the presence of  T , FNP. ° °Subjective  °  °HPI  °Diabetes Mellitus Type II, Follow-up ° °Lab Results  °Component Value Date  ° HGBA1C 6.1 (A) 05/12/2021  ° HGBA1C 6.3 (H) 01/24/2021  ° HGBA1C 5.9 (H) 02/04/2020  ° °Wt Readings from Last 3 Encounters:  °07/26/21 272 lb 3.2 oz (123.5 kg)  °05/12/21 256 lb (116.1 kg)  °03/28/21 260 lb 12.8 oz (118.3 kg)  ° °Last seen for diabetes 2 months ago.  °Management since then includes trial of mounjaro. Patient is using Ozempic 0.25 mg once a week.  °She reports excellent compliance with treatment. °She is not having side effects.  °Symptoms: °No fatigue No foot ulcerations  °No appetite changes No nausea  °No paresthesia of the feet  No polydipsia  °No polyuria No visual disturbances   °No vomiting   ° ° °Home blood sugar records: fasting range: 99 ° °Episodes of hypoglycemia? No  °  °Current insulin regiment: none °Most Recent Eye Exam: UTD °Current exercise: none °Current diet habits: in general, a "healthy" diet   ° °Pertinent Labs: °Lab Results  °Component Value Date  ° CHOL 175 01/24/2021  ° HDL 50 01/24/2021  ° LDLCALC 104 (H) 01/24/2021  ° TRIG 114 01/24/2021  ° CHOLHDL 3.5 01/24/2021  ° Lab Results  °Component Value Date  ° NA 141 03/14/2021  ° K 4.0 03/14/2021  ° CREATININE 0.93 03/14/2021  ° EGFR 74 03/14/2021  ° MICROALBUR 20 02/04/2020  ° LABMICR See below: 01/11/2021  °  ° °--------------------------------------------------------------------------------------------------- °Follow up for obesity ° °The patient was  last seen for this 2 months ago. °Changes made at last visit include trial of mounjaro. ° °She reports excellent compliance with treatment. °She feels that condition is Unchanged. °She is not having side effects.  ° °Wt Readings from Last 3 Encounters:  °07/26/21 272 lb 3.2 oz (123.5 kg)  °05/12/21 256 lb (116.1 kg)  °03/28/21 260 lb 12.8 oz (118.3 kg)  ° ° ° °----------------------------------------------------------------------------------------- ° ° °Medications: °Outpatient Medications Prior to Visit  °Medication Sig  ° Adapalene (DIFFERIN) 0.3 % gel Apply 1 application topically at bedtime.  ° Alcohol Swabs (B-D SINGLE USE SWABS REGULAR) PADS USE TO CLEANSE SKIN EVERY DAY BEFORE CHECKING BLOOD SUGAR  ° ALPRAZolam (XANAX) 1 MG tablet Take 1 mg by mouth 2 (two) times daily as needed.   ° amphetamine-dextroamphetamine (ADDERALL) 20 MG tablet Take 1 tablet (20 mg total) by mouth in the morning, at noon, and at bedtime. Has not started yet 11/10/19  ° ARIPiprazole (ABILIFY) 5 MG tablet Take 5 mg by mouth daily.  ° atorvastatin (LIPITOR) 20 MG tablet Take 1 tablet (20 mg total) by mouth daily.  ° Cholecalciferol (VITAMIN D3) 25 MCG (1000 UT) CAPS Take 1 capsule (1,000 Units total) by mouth daily.  ° Cholecalciferol 125 MCG (5000 UT) TABS Take by mouth.  ° DULoxetine (CYMBALTA) 60 MG capsule Take 2 capsules (120 mg total) by mouth daily.  ° DULoxetine (CYMBALTA) 60 MG capsule   Take by mouth.  ° ferrous sulfate 325 (65 FE) MG tablet Take by mouth.  ° fluticasone (FLONASE) 50 MCG/ACT nasal spray USE 2 SPRAYS IN BOTH  NOSTRILS DAILY  ° folic acid (FOLVITE) 800 MCG tablet Take 1 tablet (800 mcg total) by mouth daily.  ° gabapentin (NEURONTIN) 300 MG capsule Take 300 mg by mouth 3 (three) times daily.  ° Lancets Misc. (ACCU-CHEK FASTCLIX LANCET) KIT To check blood sugar once daily  ° levothyroxine (SYNTHROID) 25 MCG tablet TAKE 1 TABLET BY MOUTH  DAILY BEFORE BREAKFAST  ° lisinopril (ZESTRIL) 20 MG tablet Take 1 tablet (20  mg total) by mouth daily.  ° metoprolol succinate (TOPROL-XL) 25 MG 24 hr tablet Take 1 tablet (25 mg total) by mouth daily.  ° pregabalin (LYRICA) 200 MG capsule TAKE 1 CAPSULE BY MOUTH  TWICE DAILY  ° QUEtiapine Fumarate (SEROQUEL XR) 150 MG 24 hr tablet Take 150 mg by mouth at bedtime.   ° spironolactone (ALDACTONE) 25 MG tablet Take 1 tablet by mouth once daily  ° topiramate (TOPAMAX) 200 MG tablet Take 1 tablet (200 mg total) by mouth at bedtime.  ° traZODone (DESYREL) 150 MG tablet Take 1 tablet (150 mg total) by mouth at bedtime.  ° VRAYLAR 1.5 MG capsule Take 1.5 mg by mouth daily.  ° [DISCONTINUED] OZEMPIC, 0.25 OR 0.5 MG/DOSE, 2 MG/1.5ML SOPN INJECT 0.25 MG SUBCUTANEOUSLY ONCE A WEEK  ° [DISCONTINUED] tiZANidine (ZANAFLEX) 2 MG tablet Take 2 mg by mouth 3 (three) times daily.  ° [DISCONTINUED] fluconazole (DIFLUCAN) 150 MG tablet  (Patient not taking: Reported on 07/26/2021)  ° °No facility-administered medications prior to visit.  ° ° °Review of Systems ° ° °  Objective  °  °BP 130/80 (BP Location: Right Arm, Patient Position: Sitting, Cuff Size: Large)    Pulse 76    Temp 98.4 °F (36.9 °C) (Oral)    Resp 16    Wt 272 lb 3.2 oz (123.5 kg)    SpO2 100%    BMI 42.63 kg/m²  °{Show previous vital signs (optional):23777} ° °Physical Exam °Vitals and nursing note reviewed.  °Constitutional:   °   General: She is not in acute distress. °   Appearance: Normal appearance. She is obese. She is not ill-appearing, toxic-appearing or diaphoretic.  °HENT:  °   Head: Normocephalic and atraumatic.  °   Comments: Wearing sunglasses d/t migraine °Cardiovascular:  °   Rate and Rhythm: Normal rate and regular rhythm.  °   Pulses: Normal pulses.  °   Heart sounds: Normal heart sounds. No murmur heard. °  No friction rub. No gallop.  °Pulmonary:  °   Effort: Pulmonary effort is normal. No respiratory distress.  °   Breath sounds: Normal breath sounds. No stridor. No wheezing, rhonchi or rales.  °Chest:  °   Chest wall: No  tenderness.  °Abdominal:  °   General: Bowel sounds are normal.  °   Palpations: Abdomen is soft.  °Musculoskeletal:     °   General: No swelling, tenderness, deformity or signs of injury. Normal range of motion.  °   Right lower leg: No edema.  °   Left lower leg: No edema.  °Skin: °   General: Skin is warm and dry.  °   Capillary Refill: Capillary refill takes less than 2 seconds.  °   Coloration: Skin is not jaundiced or pale.  °   Findings: No bruising, erythema, lesion or rash.  °Neurological:  °     General: No focal deficit present.  °   Mental Status: She is alert and oriented to person, place, and time. Mental status is at baseline.  °   Cranial Nerves: No cranial nerve deficit.  °   Sensory: No sensory deficit.  °   Motor: No weakness.  °   Coordination: Coordination normal.  °Psychiatric:     °   Mood and Affect: Mood normal. Affect is flat.     °   Behavior: Behavior normal.     °   Thought Content: Thought content normal.     °   Judgment: Judgment normal.  °  ° ° °No results found for any visits on 07/26/21. ° Assessment & Plan  °  ° °Problem List Items Addressed This Visit   ° °  ° Cardiovascular and Mediastinum  ° Headache, variant migraine, intractable  °  Headache, intermittent  °One present today °Pt wearing sunglasses to assist with photophobia  °  °  ° Relevant Medications  ° gabapentin (NEURONTIN) 300 MG capsule  ° tiZANidine (ZANAFLEX) 2 MG tablet  ° Hypertension associated with diabetes (HCC)  °  Chronic, stable °Continue medications as previously prescribed °  °  ° Relevant Medications  ° Semaglutide, 1 MG/DOSE, 4 MG/3ML SOPN  °  ° Endocrine  ° Type 2 diabetes mellitus with microalbuminuria, without long-term current use of insulin (HCC)  °  Continue ozempic, request for increase dose today °  °  ° Relevant Medications  ° Semaglutide, 1 MG/DOSE, 4 MG/3ML SOPN  °  ° Nervous and Auditory  ° Chronic midline low back pain with right-sided sciatica  °  Use of cane given exacerbation; continue to  work on weight loss °Has upcoming appt with bariatric surgery  °  °  ° Relevant Medications  ° gabapentin (NEURONTIN) 300 MG capsule  ° tiZANidine (ZANAFLEX) 2 MG tablet  °  ° Other  ° Morbid obesity (HCC) - Primary  °  Ongoing obesity issues °Weight up today- not mentioned given pt's birthdate °  °  ° Relevant Medications  ° Semaglutide, 1 MG/DOSE, 4 MG/3ML SOPN  ° Muscle spasm of right leg  °  Prn muscle relaxant refills °Encouraged stretching and hydration °  °  ° Relevant Medications  ° tiZANidine (ZANAFLEX) 2 MG tablet  ° Use of cane as ambulatory aid  ° Need for shingles vaccine  ° Relevant Orders  ° Varicella-zoster vaccine IM (Completed)  ° °Other Visit Diagnoses   ° ° Need for pneumococcal vaccination      ° Relevant Orders  ° Pneumococcal conjugate vaccine 20-valent (Completed)  ° °  ° ° ° °Return in about 3 months (around 10/24/2021) for T2DM management.  °   ° °I,  T , FNP, have reviewed all documentation for this visit. The documentation on 07/26/21 for the exam, diagnosis, procedures, and orders are all accurate and complete. ° ° ° ° T , FNP  °[facility] °[phone] (phone) °[phone] (fax) ° °[facility] °

## 2021-07-26 NOTE — Assessment & Plan Note (Signed)
Headache, intermittent  One present today Pt wearing sunglasses to assist with photophobia

## 2021-07-26 NOTE — Assessment & Plan Note (Addendum)
Continue ozempic, request for increase dose today

## 2021-07-26 NOTE — Assessment & Plan Note (Signed)
Chronic, stable Continue medications as previously prescribed

## 2021-07-26 NOTE — Assessment & Plan Note (Signed)
Use of cane given exacerbation; continue to work on weight loss Has upcoming appt with bariatric surgery

## 2021-07-27 LAB — HM DIABETES EYE EXAM

## 2021-08-17 ENCOUNTER — Telehealth: Payer: Self-pay

## 2021-08-17 NOTE — Progress Notes (Signed)
Chronic Care Management Pharmacy Assistant   Name: Barbara Anderson  MRN: [id number] DOB: 09/01/1970  Reason for Encounter:Diabetes Disease State Call.   Recent office visits:  07/26/2021 [name] FNP (PCP) Increase Ozempic to 1 mg injection weekly, change Tizanidine HCI 2 mg 3 times daily, Return in about 3 months   Recent consult visits:  No recent consult Visit  Hospital visits:  None in previous 6 months  Medications: Outpatient Encounter Medications as of 08/17/2021  Medication Sig   Adapalene (DIFFERIN) 0.3 % gel Apply 1 application topically at bedtime.   Alcohol Swabs (B-D SINGLE USE SWABS REGULAR) PADS USE TO CLEANSE SKIN EVERY DAY BEFORE CHECKING BLOOD SUGAR   ALPRAZolam (XANAX) 1 MG tablet Take 1 mg by mouth 2 (two) times daily as needed.    amphetamine-dextroamphetamine (ADDERALL) 20 MG tablet Take 1 tablet (20 mg total) by mouth in the morning, at noon, and at bedtime. Has not started yet 11/10/19   ARIPiprazole (ABILIFY) 5 MG tablet Take 5 mg by mouth daily.   atorvastatin (LIPITOR) 20 MG tablet Take 1 tablet (20 mg total) by mouth daily.   Cholecalciferol (VITAMIN D3) 25 MCG (1000 UT) CAPS Take 1 capsule (1,000 Units total) by mouth daily.   Cholecalciferol 125 MCG (5000 UT) TABS Take by mouth.   DULoxetine (CYMBALTA) 60 MG capsule Take 2 capsules (120 mg total) by mouth daily.   DULoxetine (CYMBALTA) 60 MG capsule Take by mouth.   ferrous sulfate 325 (65 FE) MG tablet Take by mouth.   fluticasone (FLONASE) 50 MCG/ACT nasal spray USE 2 SPRAYS IN BOTH  NOSTRILS DAILY   folic acid (FOLVITE) 710 MCG tablet Take 1 tablet (800 mcg total) by mouth daily.   gabapentin (NEURONTIN) 300 MG capsule Take 300 mg by mouth 3 (three) times daily.   Lancets Misc. (ACCU-CHEK FASTCLIX LANCET) KIT To check blood sugar once daily   levothyroxine (SYNTHROID) 25 MCG tablet TAKE 1 TABLET BY MOUTH  DAILY BEFORE BREAKFAST   lisinopril (ZESTRIL) 20 MG tablet Take 1 tablet (20 mg total) by  mouth daily.   metoprolol succinate (TOPROL-XL) 25 MG 24 hr tablet Take 1 tablet (25 mg total) by mouth daily.   pregabalin (LYRICA) 200 MG capsule TAKE 1 CAPSULE BY MOUTH  TWICE DAILY   QUEtiapine Fumarate (SEROQUEL XR) 150 MG 24 hr tablet Take 150 mg by mouth at bedtime.    Semaglutide, 1 MG/DOSE, 4 MG/3ML SOPN Inject 1 mg as directed once a week.   spironolactone (ALDACTONE) 25 MG tablet Take 1 tablet by mouth once daily   tiZANidine (ZANAFLEX) 2 MG tablet Take 1 tablet (2 mg total) by mouth 3 (three) times daily as needed for muscle spasms.   topiramate (TOPAMAX) 200 MG tablet Take 1 tablet (200 mg total) by mouth at bedtime.   traZODone (DESYREL) 150 MG tablet Take 1 tablet (150 mg total) by mouth at bedtime.   VRAYLAR 1.5 MG capsule Take 1.5 mg by mouth daily.   No facility-administered encounter medications on file as of 08/17/2021.    Care Gaps: COVID-19 Vaccine Star Rating Drugs: Lisinopril 20 mg las filled 07/21/2021 90 day supply at [facility]. Atorvastatin 20 mg last filled 11/26/2020 90 day supply at [facility] 0.25/0.5 last filled 06/30/2021 42 day supply at [facility]  Medication Fill gaps: Aripiprazole  5 MG last filled 12/09/2020 30 day supply  Recent Relevant Labs: Lab Results  Component Value Date/Time   HGBA1C 6.1 (A) 05/12/2021 03:36 PM  HGBA1C 6.3 (H) 01/24/2021 09:13 AM   HGBA1C 5.9 (H) 02/04/2020 02:58 PM   MICROALBUR 20 02/04/2020 02:46 PM   MICROALBUR 50 07/21/2018 04:34 PM    Kidney Function Lab Results  Component Value Date/Time   CREATININE 0.93 03/14/2021 01:40 PM   CREATININE 1.10 (H) 01/24/2021 09:13 AM   CREATININE 0.88 05/12/2014 07:47 PM   CREATININE 0.79 04/22/2014 03:03 PM   CREATININE 1.03 10/21/2013 08:28 PM   GFRNONAA 66 02/04/2020 02:58 PM   GFRNONAA >60 10/21/2013 08:28 PM   GFRAA 76 02/04/2020 02:58 PM   GFRAA >60 10/21/2013 08:28 PM    Current antihyperglycemic regimen:  Ozempic 1 mg weekly What  recent interventions/DTPs have been made to improve glycemic control:  07/26/2021 [name] FNP (PCP) Increase Ozempic to 1 mg injection weekly  I have attempted without success to contact this patient by phone three times to do her diabetes Disease State call. I left a Voice message for patient to return my call.  Adherence Review: Is the patient currently on a STATIN medication? Yes Is the patient currently on ACE/ARB medication? No Does the patient have >5 day gap between last estimated fill dates? Yes   [place] Pharmacist Assistant [phone]

## 2021-08-29 ENCOUNTER — Ambulatory Visit (HOSPITAL_COMMUNITY): Payer: Medicare HMO | Admitting: Psychiatry

## 2021-08-30 ENCOUNTER — Encounter: Payer: Self-pay | Admitting: Family Medicine

## 2021-09-07 ENCOUNTER — Encounter: Payer: Self-pay | Admitting: Family Medicine

## 2021-09-12 ENCOUNTER — Ambulatory Visit (INDEPENDENT_AMBULATORY_CARE_PROVIDER_SITE_OTHER): Payer: 59 | Admitting: Family Medicine

## 2021-09-12 ENCOUNTER — Encounter: Payer: Self-pay | Admitting: Family Medicine

## 2021-09-12 ENCOUNTER — Other Ambulatory Visit: Payer: Self-pay

## 2021-09-12 VITALS — BP 125/75 | HR 73 | Resp 16 | Wt 276.4 lb

## 2021-09-12 DIAGNOSIS — E039 Hypothyroidism, unspecified: Secondary | ICD-10-CM

## 2021-09-12 DIAGNOSIS — R6882 Decreased libido: Secondary | ICD-10-CM

## 2021-09-12 DIAGNOSIS — N3941 Urge incontinence: Secondary | ICD-10-CM

## 2021-09-12 DIAGNOSIS — N189 Chronic kidney disease, unspecified: Secondary | ICD-10-CM

## 2021-09-12 DIAGNOSIS — D631 Anemia in chronic kidney disease: Secondary | ICD-10-CM

## 2021-09-12 NOTE — Progress Notes (Signed)
Established patient visit   Patient: Barbara Anderson   DOB: 01/08/1971   51 y.o. Female  MRN: [id number] Visit Date: 09/12/2021  Today's healthcare provider: Myles Gip, DO   Chief Complaint  Patient presents with   Decreased Libido    Patient would like to discuss medication options to help improve her libido, patient states that for the past several years she has noticed a decrease in her drive.    Anemia    Patient presents today with concerns of low iron level. Patient states that she has attempted to donate blood on two occasions at [facility] but was unable to do to low iron. Patient states that it was suggested to her to double up on Iron supplement. Patient is currently on Ferrous Sulfate 376m and states that she has been taking 6 tablets at a time now to bring up her iron count.    Subjective    HPI HPI     Decreased Libido    Additional comments: Patient would like to discuss medication options to help improve her libido, patient states that for the past several years she has noticed a decrease in her drive.         Anemia    Additional comments: Patient presents today with concerns of low iron level. Patient states that she has attempted to donate blood on two occasions at plasma center but was unable to do to low iron. Patient states that it was suggested to her to double up on Iron supplement. Patient is currently on Ferrous Sulfate 32105mand states that she has been taking 6 tablets at a time now to bring up her iron count.       Last edited by WoMinette HeadlandCMA on 09/12/2021 10:25 AM.      ANEMIA - h/o iron deficiency anemia, on iron supplements daily. - few weeks ago tried to give blood was told iron was too low. - taking iron supplementation, 6 tablets daily over the weekend. - 2 days menses, once per month. Light flow. Not on contraception/IUD. - no hematuria  Compliance with treatment: excellent compliance Iron supplementation side  effects: yes, constipation. Fatigue: yes Dyspnea on exertion: yes Palpitations: no Bleeding: no Pica:  craving ice  Low sex drive - years duration. No dyspareunia, hot flashes, mood swings. Unsure when mom went through menopause. No changes in chronic meds. Low orgasm, low desire for sex.  No vaginal rashes. Occasional BV, responsive to boric acid. Goal to be more spontaneous, orgasm. Does have urge incontinence. Reports good relationship with partner.    Medications: Outpatient Medications Prior to Visit  Medication Sig   Adapalene (DIFFERIN) 0.3 % gel Apply 1 application topically at bedtime.   Alcohol Swabs (B-D SINGLE USE SWABS REGULAR) PADS USE TO CLEANSE SKIN EVERY DAY BEFORE CHECKING BLOOD SUGAR   ALPRAZolam (XANAX) 1 MG tablet Take 1 mg by mouth 2 (two) times daily as needed.    amphetamine-dextroamphetamine (ADDERALL) 20 MG tablet Take 1 tablet (20 mg total) by mouth in the morning, at noon, and at bedtime. Has not started yet 11/10/19   ARIPiprazole (ABILIFY) 5 MG tablet Take 5 mg by mouth daily.   atorvastatin (LIPITOR) 20 MG tablet Take 1 tablet (20 mg total) by mouth daily.   Cholecalciferol (VITAMIN D3) 25 MCG (1000 UT) CAPS Take 1 capsule (1,000 Units total) by mouth daily.   Cholecalciferol 125 MCG (5000 UT) TABS Take by mouth.   DULoxetine (CYMBALTA) 60  MG capsule Take 2 capsules (120 mg total) by mouth daily.   DULoxetine (CYMBALTA) 60 MG capsule Take by mouth.   ferrous sulfate 325 (65 FE) MG tablet Take by mouth.   fluticasone (FLONASE) 50 MCG/ACT nasal spray USE 2 SPRAYS IN BOTH  NOSTRILS DAILY   folic acid (FOLVITE) 831 MCG tablet Take 1 tablet (800 mcg total) by mouth daily.   gabapentin (NEURONTIN) 300 MG capsule Take 300 mg by mouth 3 (three) times daily.   Lancets Misc. (ACCU-CHEK FASTCLIX LANCET) KIT To check blood sugar once daily   levothyroxine (SYNTHROID) 25 MCG tablet TAKE 1 TABLET BY MOUTH  DAILY BEFORE BREAKFAST   lisinopril (ZESTRIL) 20 MG tablet Take 1  tablet (20 mg total) by mouth daily.   metoprolol succinate (TOPROL-XL) 25 MG 24 hr tablet Take 1 tablet (25 mg total) by mouth daily.   pregabalin (LYRICA) 200 MG capsule TAKE 1 CAPSULE BY MOUTH  TWICE DAILY   QUEtiapine Fumarate (SEROQUEL XR) 150 MG 24 hr tablet Take 150 mg by mouth at bedtime.    Semaglutide, 1 MG/DOSE, 4 MG/3ML SOPN Inject 1 mg as directed once a week.   spironolactone (ALDACTONE) 25 MG tablet Take 1 tablet by mouth once daily   tiZANidine (ZANAFLEX) 2 MG tablet Take 1 tablet (2 mg total) by mouth 3 (three) times daily as needed for muscle spasms.   topiramate (TOPAMAX) 200 MG tablet Take 1 tablet (200 mg total) by mouth at bedtime.   traZODone (DESYREL) 150 MG tablet Take 1 tablet (150 mg total) by mouth at bedtime.   VRAYLAR 1.5 MG capsule Take 1.5 mg by mouth daily.   No facility-administered medications prior to visit.    Review of Systems     Objective    BP 125/75    Pulse 73    Resp 16    Wt 276 lb 6.4 oz (125.4 kg)    LMP 09/11/2021 (Exact Date)    SpO2 99%    BMI 43.29 kg/m  {Show previous vital signs (optional):23777}  Physical Exam  Gen: well appearing, in NAD Card: reg rate Lungs: Comfortable WOB on RA Ext: WWP   No results found for any visits on 09/12/21.  Assessment & Plan     Problem List Items Addressed This Visit       Endocrine   Hypothyroid    Recheck labs to assess for contribution to anemia, fatigue, low libido.      Relevant Orders   TSH     Genitourinary   Anemia in chronic kidney disease - Primary    Previous good response to iron supplementation. Checking labs today.       Relevant Orders   Iron, TIBC and Ferritin Panel   CBC with Differential/Platelet     Other   Low libido    Unclear etiology, likely multifactorial. On multiple antipsychotics which could affect libido. With recent fatigue 2/2 "low iron" may also be contributing. Also with h/o urge incontinence, likely has pelvic floor dysfunction despite no  report of dyspareunia. Will refer to pelvic PT and obtain labs. Has f/u with psychiatrist in few days, recommend discussing antipsychotic regimen to assess for possible adjustment. Has f/u scheduled. Recommend weight loss. If no improvement, would recommend obtaining PHQ and consider GYN referral or couples therapy depending on ongoing symptomatology.       Other Visit Diagnoses     Urge incontinence       Relevant Orders   Ambulatory referral to Physical Therapy  No follow-ups on file.     I,Kathleen [employer] as a Education administrator for [employer], DO.,have documented all relevant documentation on the behalf of Myles Gip, DO,as directed by  Myles Gip, DO while in the presence of Myles Gip, DO.   Myles Gip, [place] [phone] (phone) [phone] (fax)  [place]

## 2021-09-12 NOTE — Assessment & Plan Note (Signed)
Unclear etiology, likely multifactorial. On multiple antipsychotics which could affect libido. With recent fatigue 2/2 "low iron" may also be contributing. Also with h/o urge incontinence, likely has pelvic floor dysfunction despite no report of dyspareunia. Will refer to pelvic PT and obtain labs. Has f/u with psychiatrist in few days, recommend discussing antipsychotic regimen to assess for possible adjustment. Has f/u scheduled. Recommend weight loss. If no improvement, would recommend obtaining PHQ and consider GYN referral or couples therapy depending on ongoing symptomatology.

## 2021-09-12 NOTE — Assessment & Plan Note (Signed)
Previous good response to iron supplementation. Checking labs today.

## 2021-09-12 NOTE — Assessment & Plan Note (Signed)
Recheck labs to assess for contribution to anemia, fatigue, low libido.

## 2021-09-13 LAB — CBC WITH DIFFERENTIAL/PLATELET
Basophils Absolute: 0.1 10*3/uL (ref 0.0–0.2)
Basos: 1 %
EOS (ABSOLUTE): 0.3 10*3/uL (ref 0.0–0.4)
Eos: 5 %
Hematocrit: 35.8 % (ref 34.0–46.6)
Hemoglobin: 12.3 g/dL (ref 11.1–15.9)
Immature Grans (Abs): 0 10*3/uL (ref 0.0–0.1)
Immature Granulocytes: 0 %
Lymphocytes Absolute: 2 10*3/uL (ref 0.7–3.1)
Lymphs: 31 %
MCH: 31.2 pg (ref 26.6–33.0)
MCHC: 34.4 g/dL (ref 31.5–35.7)
MCV: 91 fL (ref 79–97)
Monocytes Absolute: 0.4 10*3/uL (ref 0.1–0.9)
Monocytes: 7 %
Neutrophils Absolute: 3.7 10*3/uL (ref 1.4–7.0)
Neutrophils: 56 %
Platelets: 256 10*3/uL (ref 150–450)
RBC: 3.94 x10E6/uL (ref 3.77–5.28)
RDW: 12.9 % (ref 11.7–15.4)
WBC: 6.5 10*3/uL (ref 3.4–10.8)

## 2021-09-13 LAB — IRON,TIBC AND FERRITIN PANEL
Ferritin: 86 ng/mL (ref 15–150)
Iron Saturation: 19 % (ref 15–55)
Iron: 61 ug/dL (ref 27–159)
Total Iron Binding Capacity: 315 ug/dL (ref 250–450)
UIBC: 254 ug/dL (ref 131–425)

## 2021-09-13 LAB — TSH: TSH: 1.79 u[IU]/mL (ref 0.450–4.500)

## 2021-09-14 ENCOUNTER — Telehealth: Payer: Self-pay

## 2021-09-14 NOTE — Progress Notes (Signed)
Chronic Care Management Pharmacy Assistant   Name: Barbara Anderson  MRN: [id number] DOB: 02/12/1971  Reason for Encounter:Diabetes Disease State Call.   Recent office visits:  09/12/2021 Barbara Percy DO (PCP Office) No Medication Changes noted,   Recent consult visits:  09/07/2021 Barbara Anderson MPH (Bariatrics) No Medication Changes noted, Ambulatory referral to [facility] visits:  None in previous 6 months  Medications: Outpatient Encounter Medications as of 09/14/2021  Medication Sig   Adapalene (DIFFERIN) 0.3 % gel Apply 1 application topically at bedtime.   Alcohol Swabs (B-D SINGLE USE SWABS REGULAR) PADS USE TO CLEANSE SKIN EVERY DAY BEFORE CHECKING BLOOD SUGAR   ALPRAZolam (XANAX) 1 MG tablet Take 1 mg by mouth 2 (two) times daily as needed.    amphetamine-dextroamphetamine (ADDERALL) 20 MG tablet Take 1 tablet (20 mg total) by mouth in the morning, at noon, and at bedtime. Has not started yet 11/10/19   ARIPiprazole (ABILIFY) 5 MG tablet Take 5 mg by mouth daily.   atorvastatin (LIPITOR) 20 MG tablet Take 1 tablet (20 mg total) by mouth daily.   Cholecalciferol (VITAMIN D3) 25 MCG (1000 UT) CAPS Take 1 capsule (1,000 Units total) by mouth daily.   Cholecalciferol 125 MCG (5000 UT) TABS Take by mouth.   DULoxetine (CYMBALTA) 60 MG capsule Take 2 capsules (120 mg total) by mouth daily.   DULoxetine (CYMBALTA) 60 MG capsule Take by mouth.   ferrous sulfate 325 (65 FE) MG tablet Take by mouth.   fluticasone (FLONASE) 50 MCG/ACT nasal spray USE 2 SPRAYS IN BOTH  NOSTRILS DAILY   folic acid (FOLVITE) 322 MCG tablet Take 1 tablet (800 mcg total) by mouth daily.   gabapentin (NEURONTIN) 300 MG capsule Take 300 mg by mouth 3 (three) times daily.   Lancets Misc. (ACCU-CHEK FASTCLIX LANCET) KIT To check blood sugar once daily   levothyroxine (SYNTHROID) 25 MCG tablet TAKE 1 TABLET BY MOUTH  DAILY BEFORE BREAKFAST   lisinopril (ZESTRIL) 20 MG tablet Take 1 tablet (20  mg total) by mouth daily.   metoprolol succinate (TOPROL-XL) 25 MG 24 hr tablet Take 1 tablet (25 mg total) by mouth daily.   pregabalin (LYRICA) 200 MG capsule TAKE 1 CAPSULE BY MOUTH  TWICE DAILY   QUEtiapine Fumarate (SEROQUEL XR) 150 MG 24 hr tablet Take 150 mg by mouth at bedtime.    Semaglutide, 1 MG/DOSE, 4 MG/3ML SOPN Inject 1 mg as directed once a week.   spironolactone (ALDACTONE) 25 MG tablet Take 1 tablet by mouth once daily   tiZANidine (ZANAFLEX) 2 MG tablet Take 1 tablet (2 mg total) by mouth 3 (three) times daily as needed for muscle spasms.   topiramate (TOPAMAX) 200 MG tablet Take 1 tablet (200 mg total) by mouth at bedtime.   traZODone (DESYREL) 150 MG tablet Take 1 tablet (150 mg total) by mouth at bedtime.   VRAYLAR 1.5 MG capsule Take 1.5 mg by mouth daily.   No facility-administered encounter medications on file as of 09/14/2021.    Care Gaps: COVID-19 Vaccine  Star Rating Drugs: Lisinopril 20 mg las filled 08/16/2021 100 day supply at [facility] Atorvastatin 20 mg last filled 11/26/2020 90 day supply at [facility] 0.25/0.5 last filled 08/23/2021 28 day supply at [facility].   Medication Fill gaps: Aripiprazole  5 MG last filled 12/09/2020 30 day supply  Recent Relevant Labs: Lab Results  Component Value Date/Time   HGBA1C 6.1 (A) 05/12/2021 03:36 PM   HGBA1C 6.3 (  H) 01/24/2021 09:13 AM   HGBA1C 5.9 (H) 02/04/2020 02:58 PM   MICROALBUR 20 02/04/2020 02:46 PM   MICROALBUR 50 07/21/2018 04:34 PM    Kidney Function Lab Results  Component Value Date/Time   CREATININE 0.93 03/14/2021 01:40 PM   CREATININE 1.10 (H) 01/24/2021 09:13 AM   CREATININE 0.88 05/12/2014 07:47 PM   CREATININE 0.79 04/22/2014 03:03 PM   CREATININE 1.03 10/21/2013 08:28 PM   MPNTIRWE 31 02/04/2020 02:58 PM   GFRNONAA >60 10/21/2013 08:28 PM   GFRAA 76 02/04/2020 02:58 PM   GFRAA >60 10/21/2013 08:28 PM    Current antihyperglycemic regimen:  Ozempic 1 mg  weekly What recent interventions/DTPs have been made to improve glycemic control:  07/26/2021 [name] FNP (PCP) Increase Ozempic to 1 mg injection weekly Have there been any recent hospitalizations or ED visits since last visit with CPP? No  I have attempted without success to contact this patient by phone three times to do her Diabetes Disease State call. I left a Voice message for patient to return my call.   Adherence Review: Is the patient currently on a STATIN medication? Yes Is the patient currently on ACE/ARB medication? No Does the patient have >5 day gap between last estimated fill dates? Yes  [place] Pharmacist Assistant [phone]

## 2021-09-22 ENCOUNTER — Telehealth: Payer: Self-pay | Admitting: Family Medicine

## 2021-09-22 NOTE — Telephone Encounter (Signed)
Patient advised to continue Lisinopril 20mg . For now changes to medication. Patient advised to keep next ov 10/24/21 Daneil Dan will go over medications and possibly start Farxiga.

## 2021-09-22 NOTE — Telephone Encounter (Signed)
Pt is calling returning the calling to ask had she picked up the medication for her kidneys. Pt states she is taking lisnopril for her BP Please advise [phone]

## 2021-10-09 ENCOUNTER — Telehealth: Payer: Self-pay

## 2021-10-09 NOTE — Progress Notes (Signed)
Per Clinical pharmacist, schedule patient a follow up in June. ? ?Telephone follow up appointment with Care management team member scheduled for : 01/09/2022 at 3:00 pm. ? ?Anderson Malta ?Clinical Pharmacist Assistant ?[phone]  ? ? ?

## 2021-10-13 ENCOUNTER — Telehealth: Payer: Self-pay | Admitting: Family Medicine

## 2021-10-13 ENCOUNTER — Other Ambulatory Visit: Payer: Self-pay | Admitting: Family Medicine

## 2021-10-13 NOTE — Telephone Encounter (Signed)
[place] faxed refill request for the following medications: ? ?gabapentin (NEURONTIN) 300 MG capsule  ? ?Please advise. ? ?

## 2021-10-13 NOTE — Telephone Encounter (Signed)
Please advise gabapentin rx? Not filled by provider. ?

## 2021-10-13 NOTE — Addendum Note (Signed)
Addended by: Julieta Bellini on: 10/13/2021 05:14 PM ? ? Modules accepted: Orders ? ?

## 2021-10-17 ENCOUNTER — Other Ambulatory Visit: Payer: Self-pay | Admitting: Family Medicine

## 2021-10-17 DIAGNOSIS — L700 Acne vulgaris: Secondary | ICD-10-CM

## 2021-10-17 DIAGNOSIS — E782 Mixed hyperlipidemia: Secondary | ICD-10-CM

## 2021-10-17 DIAGNOSIS — G894 Chronic pain syndrome: Secondary | ICD-10-CM

## 2021-10-17 NOTE — Telephone Encounter (Signed)
Medication Refill - Medication:  ?spironolactone (ALDACTONE) 25 MG tablet  ?atorvastatin (LIPITOR) 20 MG tablet ?pregabalin (LYRICA) 200 MG capsule  ?gabapentin (NEURONTIN) 300 MG capsule ?Cholecalciferol (VITAMIN D3) 25 MCG (1000 UT) CAPS  ? ? ?Has the patient contacted their pharmacy? Yes.   ?(Agent: If no, request that the patient contact the pharmacy for the refill. If patient does not wish to contact the pharmacy document the reason why and proceed with request.) ?(Agent: If yes, when and what did the pharmacy advise?) ? ?Preferred Pharmacy (with phone number or street name):  ?[facility] ) - [place], [place]  ?[place] [address]  ?Phone: [phone] Fax: [phone]  ? ?Has the patient been seen for an appointment in the last year OR does the patient have an upcoming appointment? Yes.   ? ?Agent: Please be advised that RX refills may take up to 3 business days. We ask that you follow-up with your pharmacy. ? ?

## 2021-10-19 IMAGING — MG DIGITAL SCREENING BREAST BILAT IMPLANT W/ TOMO W/ CAD
8 of 12 series · 8 of 28 positions shown · non-contrast
Comparison: Previous exam(s).

CLINICAL DATA: Screening.

EXAM:
DIGITAL SCREENING BILATERAL MAMMOGRAM WITH IMPLANTS, CAD AND
TOMOSYNTHESIS
TECHNIQUE: Bilateral screening digital craniocaudal and mediolateral oblique
mammograms were obtained. Bilateral screening digital breast
tomosynthesis was performed. The images were evaluated with
computer-aided detection. Standard and/or implant displaced views
were performed.

[L CC]
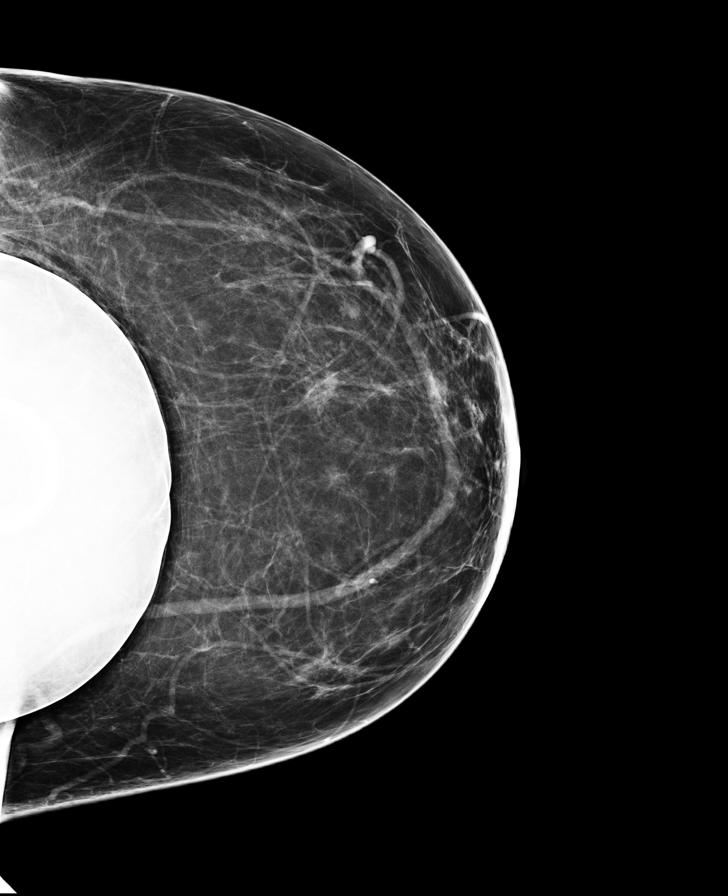

[R MLO]
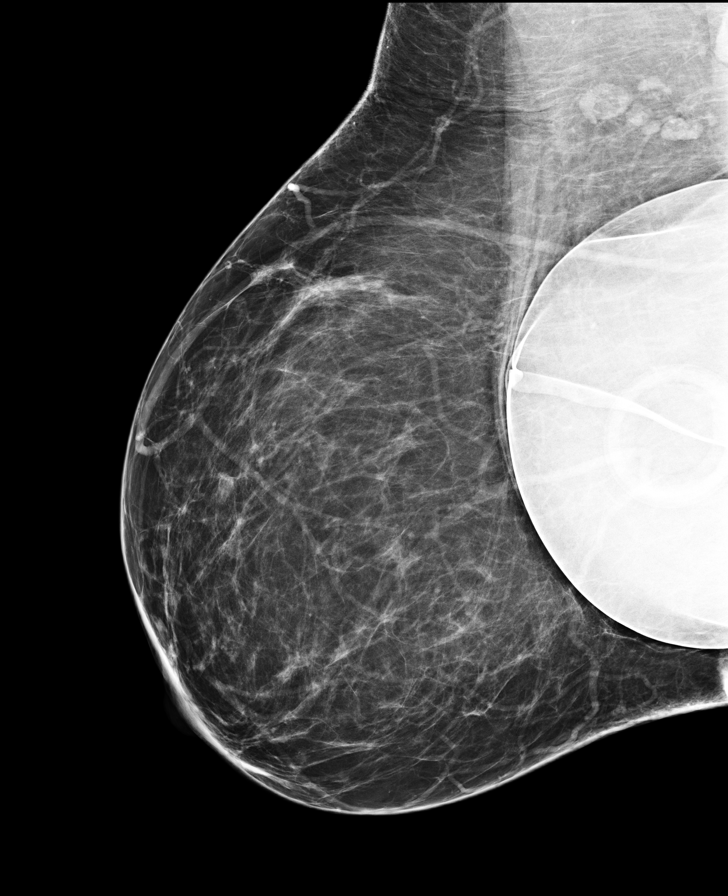

[L MLO]
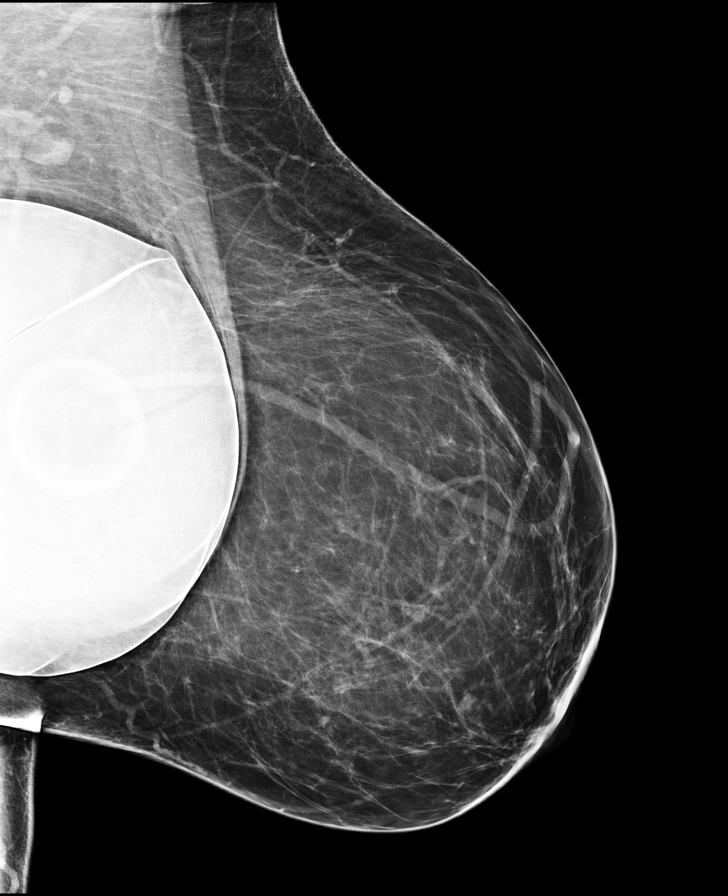

[R CC]
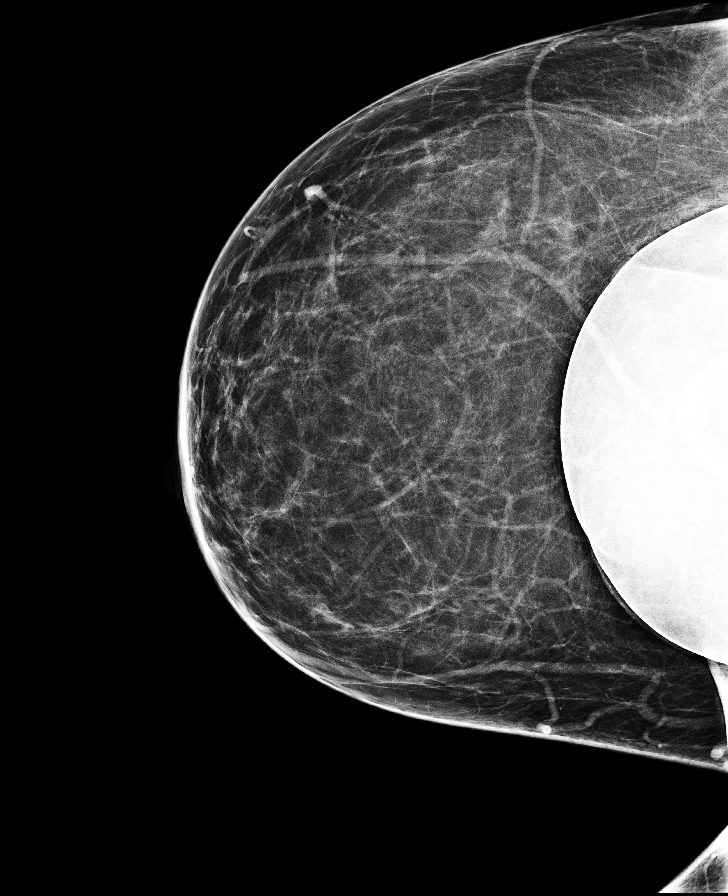

[L MLO synth-2D]
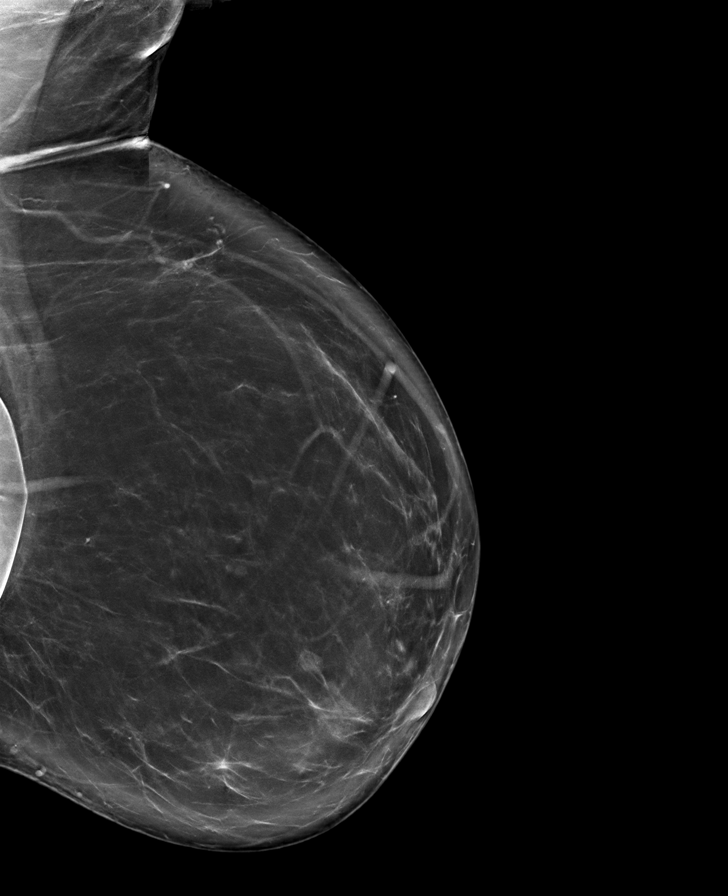

[R CC synth-2D]
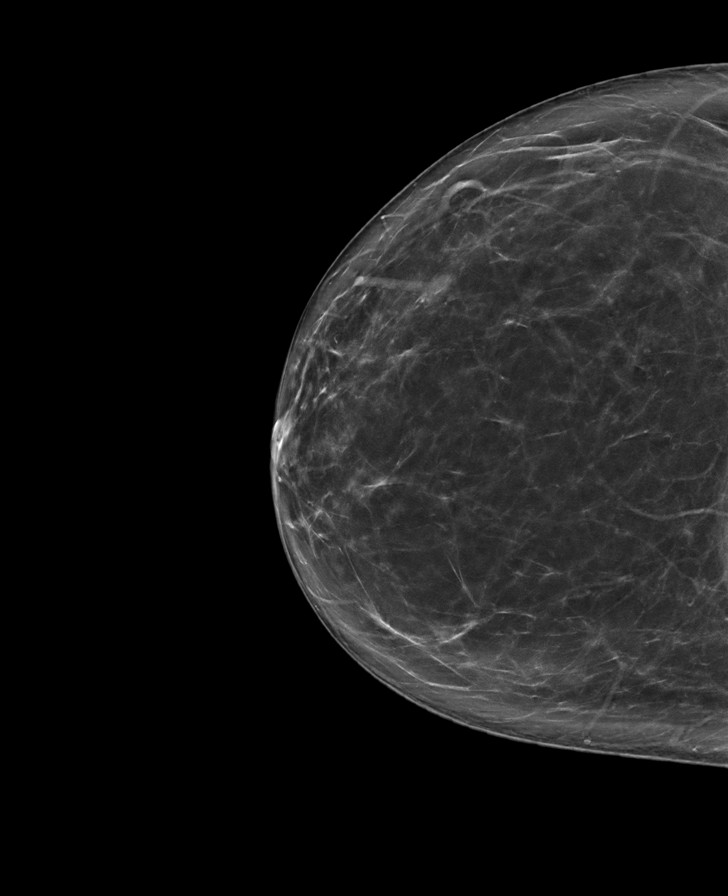

[L CC synth-2D]
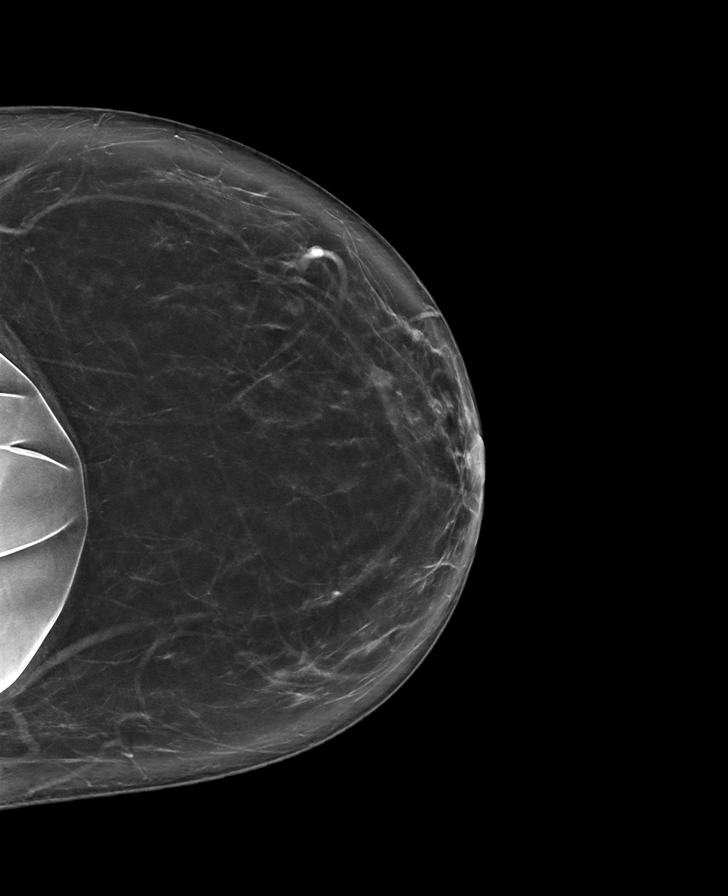

[R MLO synth-2D]
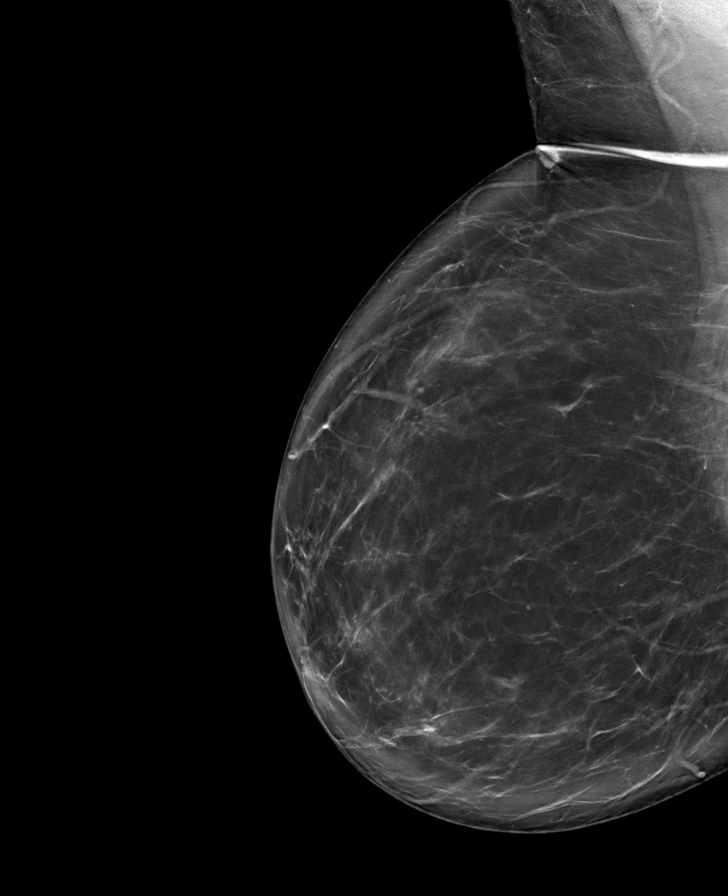

[8 of 28 positions shown; findings below may reference images not displayed]

ACR Breast Density Category b: There are scattered areas of
fibroglandular density.
FINDINGS: The patient has retropectoral implants. There are no findings
suspicious for malignancy.
IMPRESSION: No mammographic evidence of malignancy. A result letter of this
screening mammogram will be mailed directly to the patient.

RECOMMENDATION:
Screening mammogram in one year. (Code:SE-[id number])

BI-RADS CATEGORY  1:  Negative.

## 2021-10-19 MED ORDER — VITAMIN D3 25 MCG (1000 UT) PO CAPS: 1.0000 | ORAL_CAPSULE | Freq: Every day | ORAL | 0 refills | Status: AC

## 2021-10-19 MED ORDER — SPIRONOLACTONE 25 MG PO TABS: 25.0000 mg | ORAL_TABLET | Freq: Every day | ORAL | 1 refills | Status: DC

## 2021-10-19 MED ORDER — ATORVASTATIN CALCIUM 20 MG PO TABS: 20.0000 mg | ORAL_TABLET | Freq: Every day | ORAL | 1 refills | Status: DC

## 2021-10-19 MED ORDER — GABAPENTIN 300 MG PO CAPS: 300.0000 mg | ORAL_CAPSULE | Freq: Three times a day (TID) | ORAL | 0 refills | Status: DC

## 2021-10-19 NOTE — Telephone Encounter (Signed)
? Requested medication (s) are due for refill today: yes ? ?Requested medication (s) are on the active medication list: yes ? ?Last refill:  Neurontin from Historical Provider, no details, Lyrica 11/30/20 #180 with 0 RF, Vitamin D 12/22/2019 #60 with 0 RF ? ?Future visit scheduled: yes, 10/24/21 ? ?Notes to clinic:  Lyrica and Vit D not delegated, Neurontin from Historical Provider, please assess. ? ? ?Requested Prescriptions  ?Pending Prescriptions Disp Refills  ? pregabalin (LYRICA) 200 MG capsule 180 capsule 0  ?  Sig: Take 1 capsule (200 mg total) by mouth 2 (two) times daily.  ?  ? Not Delegated - Neurology:  Anticonvulsants - Controlled - pregabalin Failed - 10/17/2021  1:14 PM  ?  ?  Failed - This refill cannot be delegated  ?  ?  Passed - Cr in normal range and within 360 days  ?  Creatinine  ?Date Value Ref Range Status  ?05/12/2014 0.88 0.60 - 1.30 mg/dL Final  ? ?Creat  ?Date Value Ref Range Status  ?04/22/2014 0.79 0.50 - 1.10 mg/dL Final  ? ?Creatinine, Ser  ?Date Value Ref Range Status  ?03/14/2021 0.93 0.57 - 1.00 mg/dL Final  ?  ?  ?  ?  Passed - Completed PHQ-2 or PHQ-9 in the last 360 days  ?  ?  Passed - Valid encounter within last 12 months  ?  Recent Outpatient Visits   ? ?      ? 1 month ago Anemia in chronic kidney disease, unspecified CKD stage  ? [place], DO  ? 2 months ago Morbid obesity [facility])  ? [facility] Gwyneth Sprout, FNP  ? 5 months ago Type 2 diabetes mellitus with microalbuminuria, without long-term current use of insulin ([place])  ? [facility] Gwyneth Sprout, FNP  ? 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 [place], [place], [place]  ? 6 months ago Sinus tachycardia  ? [facility] Jerrol Banana., MD  ? ?  ?  ?Future Appointments   ? ?        ? In 5 days Gwyneth Sprout, [place], PEC  ? ?  ? ?  ?  ?  ? gabapentin (NEURONTIN) 300 MG  capsule    ?  Sig: Take 1 capsule (300 mg total) by mouth 3 (three) times daily.  ?  ? Neurology: Anticonvulsants - gabapentin Passed - 10/17/2021  1:14 PM  ?  ?  Passed - Cr in normal range and within 360 days  ?  Creatinine  ?Date Value Ref Range Status  ?05/12/2014 0.88 0.60 - 1.30 mg/dL Final  ? ?Creat  ?Date Value Ref Range Status  ?04/22/2014 0.79 0.50 - 1.10 mg/dL Final  ? ?Creatinine, Ser  ?Date Value Ref Range Status  ?03/14/2021 0.93 0.57 - 1.00 mg/dL Final  ?  ?  ?  ?  Passed - Completed PHQ-2 or PHQ-9 in the last 360 days  ?  ?  Passed - Valid encounter within last 12 months  ?  Recent Outpatient Visits   ? ?      ? 1 month ago Anemia in chronic kidney disease, unspecified CKD stage  ? [place], DO  ? 2 months ago Morbid obesity [facility])  ? [facility] Gwyneth Sprout, FNP  ? 5 months ago Type 2 diabetes mellitus with microalbuminuria, without long-term  current use of insulin ([place])  ? [facility] Gwyneth Sprout, FNP  ? 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 [place], [place], [place]  ? 6 months ago Sinus tachycardia  ? [facility] Jerrol Banana., MD  ? ?  ?  ?Future Appointments   ? ?        ? In 5 days Gwyneth Sprout, [place], PEC  ? ?  ? ?  ?  ?  ? Cholecalciferol (VITAMIN D3) 25 MCG (1000 UT) CAPS 60 capsule   ?  Sig: Take 1 capsule (1,000 Units total) by mouth daily.  ?  ? Endocrinology:  Vitamins - Vitamin D Supplementation 2 Failed - 10/17/2021  1:14 PM  ?  ?  Failed - Manual Review: Route requests for 50,000 IU strength to the provider  ?  ?  Failed - Vitamin D in normal range and within 360 days  ?  Vit D, 25-Hydroxy  ?Date Value Ref Range Status  ?02/04/2020 28.8 (L) 30.0 - 100.0 ng/mL Final  ?  Comment:  ?  Vitamin D deficiency has been defined by the Institute of ?Medicine and an Endocrine Society practice guideline as a ?level of serum 25-OH  vitamin D less than 20 ng/mL (1,2). ?The Endocrine Society went on to further define vitamin D ?insufficiency as a level between 21 and 29 ng/mL (2). ?1. IOM (Institute of Medicine). 2010. Dietary reference ?   intakes for calcium and D. [place]: The ?   [employer]. ?2. Holick MF, Binkley Watson, Bischoff-Ferrari HA, et al. ?   Evaluation, treatment, and prevention of vitamin D ?   deficiency: an Endocrine Society clinical practice ?   guideline. JCEM. 2011 Jul; 96(7):1911-30. ?  ?  ?  ?  ?  Passed - Ca in normal range and within 360 days  ?  Calcium  ?Date Value Ref Range Status  ?03/14/2021 9.1 8.7 - 10.2 mg/dL Final  ? ?Calcium, Total  ?Date Value Ref Range Status  ?05/12/2014 8.1 (L) 8.5 - 10.1 mg/dL Final  ?  ?  ?  ?  Passed - Valid encounter within last 12 months  ?  Recent Outpatient Visits   ? ?      ? 1 month ago Anemia in chronic kidney disease, unspecified CKD stage  ? [place], DO  ? 2 months ago Morbid obesity [facility])  ? [facility] Gwyneth Sprout, FNP  ? 5 months ago Type 2 diabetes mellitus with microalbuminuria, without long-term current use of insulin ([place])  ? [facility] Gwyneth Sprout, FNP  ? 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 [place], [place], [place]  ? 6 months ago Sinus tachycardia  ? [facility] Jerrol Banana., MD  ? ?  ?  ?Future Appointments   ? ?        ? In 5 days Gwyneth Sprout, [place], PEC  ? ?  ? ?  ?  ?  ?Signed Prescriptions Disp Refills  ? atorvastatin (LIPITOR) 20 MG tablet 90 tablet 1  ?  Sig: Take 1 tablet (20 mg total) by mouth daily.  ?  ? Cardiovascular:  Antilipid - Statins Failed - 10/17/2021  1:14 PM  ?  ?  Failed - Lipid Panel in normal range within the last  12 months  ?  Cholesterol, Total  ?Date Value Ref Range Status  ?01/24/2021 175 100 - 199 mg/dL Final  ? ?LDL Chol Calc (NIH)  ?Date Value Ref  Range Status  ?01/24/2021 104 (H) 0 - 99 mg/dL Final  ? ?HDL  ?Date Value Ref Range Status  ?01/24/2021 50 >39 mg/dL Final  ? ?Triglycerides  ?Date Value Ref Range Status  ?01/24/2021 114 0 - 149 mg/dL Final  ? ?  ?  ?  Passed - Patient is not pregnant  ?  ?  Passed - Valid encounter within last 12 months  ?  Recent Outpatient Visits   ? ?      ? 1 month ago Anemia in chronic kidney disease, unspecified CKD stage  ? [place], DO  ? 2 months ago Morbid obesity [facility])  ? [facility] Gwyneth Sprout, FNP  ? 5 months ago Type 2 diabetes mellitus with microalbuminuria, without long-term current use of insulin ([place])  ? [facility] Gwyneth Sprout, FNP  ? 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 [place], [place], [place]  ? 6 months ago Sinus tachycardia  ? [facility] Jerrol Banana., MD  ? ?  ?  ?Future Appointments   ? ?        ? In 5 days Gwyneth Sprout, [place], PEC  ? ?  ? ?  ?  ?  ? spironolactone (ALDACTONE) 25 MG tablet 90 tablet 1  ?  Sig: Take 1 tablet (25 mg total) by mouth daily.  ?  ? Cardiovascular: Diuretics - Aldosterone Antagonist Failed - 10/17/2021  1:14 PM  ?  ?  Failed - Cr in normal range and within 180 days  ?  Creatinine  ?Date Value Ref Range Status  ?05/12/2014 0.88 0.60 - 1.30 mg/dL Final  ? ?Creat  ?Date Value Ref Range Status  ?04/22/2014 0.79 0.50 - 1.10 mg/dL Final  ? ?Creatinine, Ser  ?Date Value Ref Range Status  ?03/14/2021 0.93 0.57 - 1.00 mg/dL Final  ?  ?  ?  ?  Failed - K in normal range and within 180 days  ?  Potassium  ?Date Value Ref Range Status  ?03/14/2021 4.0 3.5 - 5.2 mmol/L Final  ?05/12/2014 3.0 (L) 3.5 - 5.1 mmol/L Final  ?  ?  ?  ?  Failed - Na in normal range and within 180 days  ?  Sodium  ?Date Value Ref Range Status  ?03/14/2021 141 134 - 144 mmol/L Final  ?05/12/2014 144 136 - 145 mmol/L Final  ?  ?  ?  ?  Failed -  eGFR is 30 or above and within 180 days  ?  EGFR (African American)  ?Date Value Ref Range Status  ?10/21/2013 >60  Final  ? ?GFR calc Af Wyvonnia Lora  ?Date Value Ref Range Status  ?02/04/2020 76 >59 mL/min/1.73

## 2021-10-19 NOTE — Telephone Encounter (Signed)
Requested Prescriptions  ?Pending Prescriptions Disp Refills  ?? atorvastatin (LIPITOR) 20 MG tablet 90 tablet 1  ?  Sig: Take 1 tablet (20 mg total) by mouth daily.  ?  ? Cardiovascular:  Antilipid - Statins Failed - 10/17/2021  1:14 PM  ?  ?  Failed - Lipid Panel in normal range within the last 12 months  ?  Cholesterol, Total  ?Date Value Ref Range Status  ?01/24/2021 175 100 - 199 mg/dL Final  ? ?LDL Chol Calc (NIH)  ?Date Value Ref Range Status  ?01/24/2021 104 (H) 0 - 99 mg/dL Final  ? ?HDL  ?Date Value Ref Range Status  ?01/24/2021 50 >39 mg/dL Final  ? ?Triglycerides  ?Date Value Ref Range Status  ?01/24/2021 114 0 - 149 mg/dL Final  ? ?  ?  ?  Passed - Patient is not pregnant  ?  ?  Passed - Valid encounter within last 12 months  ?  Recent Outpatient Visits   ?      ? 1 month ago Anemia in chronic kidney disease, unspecified CKD stage  ? [place], DO  ? 2 months ago Morbid obesity [facility])  ? [facility] Barbara Sprout, FNP  ? 5 months ago Type 2 diabetes mellitus with microalbuminuria, without long-term current use of insulin ([place])  ? [facility] Barbara Sprout, FNP  ? 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 [place], [place], [place]  ? 6 months ago Sinus tachycardia  ? [facility] Jerrol Banana., MD  ?  ?  ?Future Appointments   ?        ? In 5 days Barbara Anderson, [place], PEC  ?  ? ?  ?  ?  ?? spironolactone (ALDACTONE) 25 MG tablet 90 tablet 1  ?  Sig: Take 1 tablet (25 mg total) by mouth daily.  ?  ? Cardiovascular: Diuretics - Aldosterone Antagonist Failed - 10/17/2021  1:14 PM  ?  ?  Failed - Cr in normal range and within 180 days  ?  Creatinine  ?Date Value Ref Range Status  ?05/12/2014 0.88 0.60 - 1.30 mg/dL Final  ? ?Creat  ?Date Value Ref Range Status  ?04/22/2014 0.79 0.50 - 1.10 mg/dL Final  ? ?Creatinine, Ser  ?Date Value Ref Range Status   ?03/14/2021 0.93 0.57 - 1.00 mg/dL Final  ?   ?  ?  Failed - K in normal range and within 180 days  ?  Potassium  ?Date Value Ref Range Status  ?03/14/2021 4.0 3.5 - 5.2 mmol/L Final  ?05/12/2014 3.0 (L) 3.5 - 5.1 mmol/L Final  ?   ?  ?  Failed - Na in normal range and within 180 days  ?  Sodium  ?Date Value Ref Range Status  ?03/14/2021 141 134 - 144 mmol/L Final  ?05/12/2014 144 136 - 145 mmol/L Final  ?   ?  ?  Failed - eGFR is 30 or above and within 180 days  ?  EGFR (African American)  ?Date Value Ref Range Status  ?10/21/2013 >60  Final  ? ?GFR calc Af Wyvonnia Lora  ?Date Value Ref Range Status  ?02/04/2020 76 >59 mL/min/1.73 Final  ?  Comment:  ?  **[facility] currently reports eGFR in compliance with the current** ?  recommendations of the [employer]. [facility] will ?  update reporting as new guidelines are published  from the NKF-ASN ?  Task force. ?  ? ?EGFR (Non-African Amer.)  ?Date Value Ref Range Status  ?10/21/2013 >60  Final  ?  Comment:  ?  eGFR values <89m/min/1.73 m2 may be an indication of chronic ?kidney disease (CKD). ?Calculated eGFR is useful in patients with stable renal function. ?The eGFR calculation will not be reliable in acutely ill patients ?when serum creatinine is changing rapidly. It is not useful in  ?patients on dialysis. The eGFR calculation may not be applicable ?to patients at the low and high extremes of body sizes, pregnant ?women, and vegetarians. ?  ? ?GFR calc non Af Amer  ?Date Value Ref Range Status  ?02/04/2020 66 >59 mL/min/1.73 Final  ? ?eGFR  ?Date Value Ref Range Status  ?03/14/2021 74 >59 mL/min/1.73 Final  ?   ?  ?  Passed - Last BP in normal range  ?  BP Readings from Last 1 Encounters:  ?09/12/21 125/75  ?   ?  ?  Passed - Valid encounter within last 6 months  ?  Recent Outpatient Visits   ?      ? 1 month ago Anemia in chronic kidney disease, unspecified CKD stage  ? [place]  ? 2 months ago Morbid obesity ([facility]  ?  [facility]  ? 5 months ago Type 2 diabetes mellitus with microalbuminuria, without long-term current use of insulin ([place]  ? [facility]  ? 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 [place] ? 6 months ago Sinus tachycardia  ? [facility]  ?  ?  ?Future Appointments   ?        ? In 5 days PGwyneth Anderson [place] PEC  ?  ? ?  ?  ?  ?? pregabalin (LYRICA) 200 MG capsule 180 capsule 0  ?  Sig: Take 1 capsule (200 mg total) by mouth 2 (two) times daily.  ?  ? Not Delegated - Neurology:  Anticonvulsants - Controlled - pregabalin Failed - 10/17/2021  1:14 PM  ?  ?  Failed - This refill cannot be delegated  ?  ?  Passed - Cr in normal range and within 360 days  ?  Creatinine  ?Date Value Ref Range Status  ?05/12/2014 0.88 0.60 - 1.30 mg/dL Final  ? ?Creat  ?Date Value Ref Range Status  ?04/22/2014 0.79 0.50 - 1.10 mg/dL Final  ? ?Creatinine, Ser  ?Date Value Ref Range Status  ?03/14/2021 0.93 0.57 - 1.00 mg/dL Final  ?   ?  ?  Passed - Completed PHQ-2 or PHQ-9 in the last 360 days  ?  ?  Passed - Valid encounter within last 12 months  ?  Recent Outpatient Visits   ?      ? 1 month ago Anemia in chronic kidney disease, unspecified CKD stage  ? [place]  ? 2 months ago Morbid obesity ([facility]  ? [facility]  ? 5 months ago Type 2 diabetes mellitus with microalbuminuria, without long-term current use of insulin ([place]  ? [facility]  ? 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 [place] ? 6 months ago Sinus tachycardia  ?  [facility] Jerrol Banana., MD  ?  ?  ?Future Appointments   ?        ? In 5 days Barbara Anderson, [place], PEC  ?  ? ?  ?  ?  ?? gabapentin (NEURONTIN) 300 MG capsule    ?  Sig: Take 1 capsule (300 mg total) by mouth 3 (three) times daily.  ?  ? Neurology: Anticonvulsants - gabapentin Passed - 10/17/2021  1:14 PM  ?  ?  Passed - Cr in normal range and within 360 days  ?  Creatinine  ?Date Value Ref Range Status  ?05/12/2014 0.88 0.60 - 1.30 mg/dL Final  ? ?Creat  ?Date Value Ref Range Status  ?04/22/2014 0.79 0.50 - 1.10 mg/dL Final  ? ?Creatinine, Ser  ?Date Value Ref Range Status  ?03/14/2021 0.93 0.57 - 1.00 mg/dL Final  ?   ?  ?  Passed - Completed PHQ-2 or PHQ-9 in the last 360 days  ?  ?  Passed - Valid encounter within last 12 months  ?  Recent Outpatient Visits   ?      ? 1 month ago Anemia in chronic kidney disease, unspecified CKD stage  ? [place], DO  ? 2 months ago Morbid obesity [facility])  ? [facility] Barbara Sprout, FNP  ? 5 months ago Type 2 diabetes mellitus with microalbuminuria, without long-term current use of insulin ([place])  ? [facility] Barbara Sprout, FNP  ? 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 [place], [place], [place]  ? 6 months ago Sinus tachycardia  ? [facility] MD  ?  ?  ?Future Appointments   ?        ? In 5 days Barbara Anderson, [place], PEC  ?  ? ?  ?  ?  ?? Cholecalciferol (VITAMIN D3) 25 MCG (1000 UT) CAPS 60 capsule   ?  Sig: Take 1 capsule (1,000 Units total) by mouth daily.  ?  ? Endocrinology:  Vitamins - Vitamin D Supplementation 2 Failed - 10/17/2021  1:14 PM  ?  ?  Failed - Manual Review: Route requests for 50,000 IU strength to the provider  ?  ?  Failed - Vitamin D in normal range and within 360 days  ?  Vit D, 25-Hydroxy  ?Date Value Ref Range Status  ?02/04/2020 28.8 (L) 30.0 - 100.0 ng/mL Final  ?  Comment:  ?  Vitamin D deficiency has been defined by the Institute of ?Medicine and an Endocrine  Society practice guideline as a ?level of serum 25-OH vitamin D less than 20 ng/mL (1,2). ?The Endocrine Society went on to further define vitamin D ?insufficiency as a level between 21 and 29 ng/mL (2). ?1. IOM

## 2021-10-24 ENCOUNTER — Ambulatory Visit: Payer: Medicare HMO | Admitting: Family Medicine

## 2021-10-25 NOTE — Progress Notes (Signed)
? ? [employer] as a Education administrator for Gwyneth Sprout, FNP.,have documented all relevant documentation on the behalf of Gwyneth Sprout, FNP,as directed by  Gwyneth Sprout, FNP while in the presence of Gwyneth Sprout, FNP.  ? ? ?Established patient visit ? ? ?Patient: Barbara Anderson   DOB: 06-09-1971   51 y.o. Female  MRN: [id number] ?Visit Date: 10/26/2021 ? ?Today's healthcare provider: Gwyneth Sprout, FNP  ?Re Introduced to nurse practitioner role and practice setting.  All questions answered.  Discussed provider/patient relationship and expectations. ? ?Chief Complaint  ?Patient presents with  ? Diabetes  ? decrease libido  ?  Patient would like to discuss  medication options to help with decreased libido.   ? ?Subjective  ?  ?HPI ?HPI   ? ? decrease libido   ? Additional comments: Patient would like to discuss  medication options to help with decreased libido.  ? ?  ?  ?Last edited by Minette Headland, CMA on 10/26/2021  2:16 PM.  ?  ?  ?Diabetes Mellitus Type II, Follow-up ? ?Lab Results  ?Component Value Date  ? HGBA1C 6.6 (A) 10/26/2021  ? HGBA1C 6.1 (A) 05/12/2021  ? HGBA1C 6.3 (H) 01/24/2021  ? ?Wt Readings from Last 3 Encounters:  ?10/26/21 284 lb 1.6 oz (128.9 kg)  ?09/12/21 276 lb 6.4 oz (125.4 kg)  ?07/26/21 272 lb 3.2 oz (123.5 kg)  ? ?Last seen for diabetes 3 months ago.  ?Management since then includes Continue  Increase Ozempic to 1 mg injection weekly, change Tizanidine HCI 2 mg 3 times daily ?She reports excellent compliance with treatment. ?She is not having side effects.  ?Symptoms: ?No fatigue No foot ulcerations  ?No appetite changes No nausea  ?No paresthesia of the feet  No polydipsia  ?No polyuria No visual disturbances   ?No vomiting   ? ? ?Home blood sugar records:  checked occasionally, average blood sugar 92-105 ? ?Episodes of hypoglycemia? Denies; however, reports some visual changes, maybe 2 times/ month; complaints of blurred vision, clears with blinking on repeat. I do not think  this is blood sugar related. ?  ?Current insulin regiment: ozempic weekly ?Most Recent Eye Exam: UTD ?Current exercise: no regular exercise ?Current diet habits: well balanced ? ?Pertinent Labs: ?Lab Results  ?Component Value Date  ? CHOL 175 01/24/2021  ? HDL 50 01/24/2021  ? Dent 104 (H) 01/24/2021  ? TRIG 114 01/24/2021  ? CHOLHDL 3.5 01/24/2021  ? Lab Results  ?Component Value Date  ? NA 141 03/14/2021  ? K 4.0 03/14/2021  ? CREATININE 0.93 03/14/2021  ? EGFR 74 03/14/2021  ? MICROALBUR 20 02/04/2020  ? LABMICR See below: 01/11/2021  ?  ? ?---------------------------------------------------------------------------------------------------  ? ?Medications: ?Outpatient Medications Prior to Visit  ?Medication Sig  ? Adapalene (DIFFERIN) 0.3 % gel Apply 1 application topically at bedtime.  ? Alcohol Swabs (B-D SINGLE USE SWABS REGULAR) PADS USE TO CLEANSE SKIN EVERY DAY BEFORE CHECKING BLOOD SUGAR  ? ALPRAZolam (XANAX) 1 MG tablet Take 1 mg by mouth 2 (two) times daily as needed.   ? amphetamine-dextroamphetamine (ADDERALL) 20 MG tablet Take 1 tablet (20 mg total) by mouth in the morning, at noon, and at bedtime. Has not started yet 11/10/19  ? ARIPiprazole (ABILIFY) 5 MG tablet Take 5 mg by mouth daily.  ? atorvastatin (LIPITOR) 20 MG tablet Take 1 tablet (20 mg total) by mouth daily.  ? Cholecalciferol (VITAMIN D3) 25 MCG (1000 UT) CAPS Take 1 capsule (  1,000 Units total) by mouth daily.  ? DULoxetine (CYMBALTA) 60 MG capsule Take 2 capsules (120 mg total) by mouth daily.  ? DULoxetine (CYMBALTA) 60 MG capsule Take by mouth.  ? ferrous sulfate 325 (65 FE) MG tablet Take by mouth.  ? fluticasone (FLONASE) 50 MCG/ACT nasal spray USE 2 SPRAYS IN BOTH  NOSTRILS DAILY  ? folic acid (FOLVITE) 295 MCG tablet Take 1 tablet (800 mcg total) by mouth daily.  ? gabapentin (NEURONTIN) 300 MG capsule Take 1 capsule (300 mg total) by mouth 3 (three) times daily.  ? Lancets Misc. (ACCU-CHEK FASTCLIX LANCET) KIT To check blood  sugar once daily  ? levothyroxine (SYNTHROID) 25 MCG tablet TAKE 1 TABLET BY MOUTH  DAILY BEFORE BREAKFAST  ? lisinopril (ZESTRIL) 20 MG tablet Take 1 tablet (20 mg total) by mouth daily.  ? QUEtiapine Fumarate (SEROQUEL XR) 150 MG 24 hr tablet Take 150 mg by mouth at bedtime.   ? Semaglutide, 1 MG/DOSE, 4 MG/3ML SOPN Inject 1 mg as directed once a week.  ? spironolactone (ALDACTONE) 25 MG tablet Take 1 tablet (25 mg total) by mouth daily.  ? tiZANidine (ZANAFLEX) 2 MG tablet Take 1 tablet (2 mg total) by mouth 3 (three) times daily as needed for muscle spasms.  ? topiramate (TOPAMAX) 200 MG tablet Take 1 tablet (200 mg total) by mouth at bedtime.  ? traZODone (DESYREL) 150 MG tablet Take 1 tablet (150 mg total) by mouth at bedtime.  ? VRAYLAR 1.5 MG capsule Take 1.5 mg by mouth daily.  ? [DISCONTINUED] metoprolol succinate (TOPROL-XL) 25 MG 24 hr tablet Take 1 tablet (25 mg total) by mouth daily.  ? ?No facility-administered medications prior to visit.  ? ? ?Review of Systems ? ? ?  Objective  ?  ?BP 140/80   Pulse 71   Resp 16   Wt 284 lb 1.6 oz (128.9 kg)   SpO2 100%   BMI 44.50 kg/m?  ? ? ?Physical Exam ?Vitals and nursing note reviewed.  ?Constitutional:   ?   General: She is not in acute distress. ?   Appearance: Normal appearance. She is obese. She is not ill-appearing, toxic-appearing or diaphoretic.  ?HENT:  ?   Head: Normocephalic and atraumatic.  ?Cardiovascular:  ?   Rate and Rhythm: Normal rate and regular rhythm.  ?   Pulses: Normal pulses.  ?   Heart sounds: Normal heart sounds. No murmur heard. ?  No friction rub. No gallop.  ?Pulmonary:  ?   Effort: Pulmonary effort is normal. No respiratory distress.  ?   Breath sounds: Normal breath sounds. No stridor. No wheezing, rhonchi or rales.  ?Chest:  ?   Chest wall: No tenderness.  ?Abdominal:  ?   General: Bowel sounds are normal.  ?   Palpations: Abdomen is soft.  ?Musculoskeletal:     ?   General: No swelling, tenderness, deformity or signs of  injury. Normal range of motion.  ?   Right lower leg: No edema.  ?   Left lower leg: No edema.  ?Skin: ?   General: Skin is warm and dry.  ?   Capillary Refill: Capillary refill takes less than 2 seconds.  ?   Coloration: Skin is not jaundiced or pale.  ?   Findings: No bruising, erythema, lesion or rash.  ?Neurological:  ?   General: No focal deficit present.  ?   Mental Status: She is alert and oriented to person, place, and time. Mental status is  at baseline.  ?   Cranial Nerves: No cranial nerve deficit.  ?   Sensory: No sensory deficit.  ?   Motor: No weakness.  ?   Coordination: Coordination normal.  ?Psychiatric:     ?   Attention and Perception: Attention normal.     ?   Mood and Affect: Mood is depressed. Affect is blunt and flat.     ?   Behavior: Behavior normal.     ?   Thought Content: Thought content normal.     ?   Judgment: Judgment normal.  ?  ? ?Results for orders placed or performed in visit on 10/26/21  ?POCT glycosylated hemoglobin (Hb A1C)  ?Result Value Ref Range  ? Hemoglobin A1C 6.6 (A) 4.0 - 5.6 %  ? HbA1c POC (<> result, manual entry)    ? HbA1c, POC (prediabetic range)    ? HbA1c, POC (controlled diabetic range)    ? ? Assessment & Plan  ?  ? ?Problem List Items Addressed This Visit   ? ?  ? Cardiovascular and Mediastinum  ? Hypertension associated with diabetes ([place])  ?  Chronic, previously stable; elevated today at 140/80 ?Reinforce use of DASH diet ?Denies CP ?Denies SOB/ DOE ?Denies low blood pressure/hypotension ?Denies vision changes ?No LE Edema noted on exam ?Continue medication, Lisinopril 20 mg, Toprol XL 25 mg both QD ?Denies side effects ?Refills provided ?Seek emergent care if you develop chest pain or chest pressure ? ?  ?  ? Relevant Medications  ? metoprolol succinate (TOPROL-XL) 25 MG 24 hr tablet  ?  ? Endocrine  ? Type 2 diabetes mellitus with microalbuminuria, without long-term current use of insulin (HCC) - Primary  ?  A1c 6.6% today ?Increased from 6.1% 6 months ago;  weight has also increased ?Pt continues on ozempic weekly at 1 mg/week ?Urine micro completed today ?Encouraged vaccinations- completed second shingles today ?Continue to recommend balanced, lower carb meals.

## 2021-10-26 ENCOUNTER — Ambulatory Visit (INDEPENDENT_AMBULATORY_CARE_PROVIDER_SITE_OTHER): Payer: 59 | Admitting: Family Medicine

## 2021-10-26 ENCOUNTER — Encounter: Payer: Self-pay | Admitting: Family Medicine

## 2021-10-26 VITALS — BP 140/80 | HR 71 | Resp 16 | Wt 284.1 lb

## 2021-10-26 DIAGNOSIS — E1129 Type 2 diabetes mellitus with other diabetic kidney complication: Secondary | ICD-10-CM | POA: Diagnosis not present

## 2021-10-26 DIAGNOSIS — R000 Tachycardia, unspecified: Secondary | ICD-10-CM

## 2021-10-26 DIAGNOSIS — M5441 Lumbago with sciatica, right side: Secondary | ICD-10-CM

## 2021-10-26 DIAGNOSIS — Z23 Encounter for immunization: Secondary | ICD-10-CM

## 2021-10-26 DIAGNOSIS — F5231 Female orgasmic disorder: Secondary | ICD-10-CM | POA: Diagnosis not present

## 2021-10-26 DIAGNOSIS — G8929 Other chronic pain: Secondary | ICD-10-CM

## 2021-10-26 DIAGNOSIS — E1159 Type 2 diabetes mellitus with other circulatory complications: Secondary | ICD-10-CM

## 2021-10-26 DIAGNOSIS — I152 Hypertension secondary to endocrine disorders: Secondary | ICD-10-CM

## 2021-10-26 DIAGNOSIS — R809 Proteinuria, unspecified: Secondary | ICD-10-CM | POA: Diagnosis not present

## 2021-10-26 DIAGNOSIS — F520 Hypoactive sexual desire disorder: Secondary | ICD-10-CM | POA: Diagnosis not present

## 2021-10-26 LAB — POCT GLYCOSYLATED HEMOGLOBIN (HGB A1C): Hemoglobin A1C: 6.6 % — AB (ref 4.0–5.6)

## 2021-10-26 MED ORDER — METOPROLOL SUCCINATE ER 25 MG PO TB24: 25.0000 mg | ORAL_TABLET | Freq: Every day | ORAL | 3 refills | Status: DC

## 2021-10-26 NOTE — Assessment & Plan Note (Signed)
Chronic, stable ?Continues to impair mobility forcing pt to use cane for mobilization ?Has planned surgery scheduled for later this spring ?

## 2021-10-26 NOTE — Assessment & Plan Note (Signed)
Chronic, multiple years ?Has never discussed previously ?Not interfering with relationship ?Encourage referral to GYN ?Did not wish to use Wellbutrin to assist ?

## 2021-10-26 NOTE — Assessment & Plan Note (Signed)
A1c 6.6% today ?Increased from 6.1% 6 months ago; weight has also increased ?Pt continues on ozempic weekly at 1 mg/week ?Urine micro completed today ?Encouraged vaccinations- completed second shingles today ?Continue to recommend balanced, lower carb meals. Smaller meal size, adding snacks. Choosing water as drink of choice and increasing purposeful exercise. ? ?

## 2021-10-26 NOTE — Assessment & Plan Note (Signed)
Pt reports years without an orgasm ?Will refer to GYN for assistance ?

## 2021-10-26 NOTE — Assessment & Plan Note (Signed)
Chronic, previously stable; elevated today at 140/80 ?Reinforce use of DASH diet ?Denies CP ?Denies SOB/ DOE ?Denies low blood pressure/hypotension ?Denies vision changes ?No LE Edema noted on exam ?Continue medication, Lisinopril 20 mg, Toprol XL 25 mg both QD ?Denies side effects ?Refills provided ?Seek emergent care if you develop chest pain or chest pressure ? ?

## 2021-10-26 NOTE — Assessment & Plan Note (Signed)
2/2 dose provided today ?

## 2021-10-27 LAB — MICROALBUMIN / CREATININE URINE RATIO
Creatinine, Urine: 120.4 mg/dL
Microalb/Creat Ratio: 3 mg/g creat (ref 0–29)
Microalbumin, Urine: 3.2 ug/mL

## 2021-10-30 ENCOUNTER — Other Ambulatory Visit: Payer: Self-pay | Admitting: Family Medicine

## 2021-10-31 ENCOUNTER — Other Ambulatory Visit: Payer: Self-pay | Admitting: Family Medicine

## 2021-10-31 DIAGNOSIS — Z748 Other problems related to care provider dependency: Secondary | ICD-10-CM

## 2021-10-31 DIAGNOSIS — Z5941 Food insecurity: Secondary | ICD-10-CM

## 2021-10-31 DIAGNOSIS — Z59819 Housing instability, housed unspecified: Secondary | ICD-10-CM

## 2021-10-31 NOTE — Telephone Encounter (Signed)
Requested Prescriptions  ?Pending Prescriptions Disp Refills  ?? OZEMPIC, 1 MG/DOSE, 4 MG/3ML SOPN [Pharmacy Med Name: OZEMPIC '1MG'$  PER DOSE (1X'4MG'$  PEN)] 9 mL 0  ?  Sig: INJECT 1 MG ONCE WEEK AS DIRECTED  ?  ? Endocrinology:  Diabetes - GLP-1 Receptor Agonists - semaglutide Failed - 10/30/2021  5:23 AM  ?  ?  Failed - HBA1C in normal range and within 180 days  ?  Hemoglobin A1C  ?Date Value Ref Range Status  ?10/26/2021 6.6 (A) 4.0 - 5.6 % Final  ? ?Hgb A1c MFr Bld  ?Date Value Ref Range Status  ?01/24/2021 6.3 (H) 4.8 - 5.6 % Final  ?  Comment:  ?           Prediabetes: 5.7 - 6.4 ?         Diabetes: >6.4 ?         Glycemic control for adults with diabetes: <7.0 ?  ?   ?  ?  Passed - Cr in normal range and within 360 days  ?  Creatinine  ?Date Value Ref Range Status  ?05/12/2014 0.88 0.60 - 1.30 mg/dL Final  ? ?Creat  ?Date Value Ref Range Status  ?04/22/2014 0.79 0.50 - 1.10 mg/dL Final  ? ?Creatinine, Ser  ?Date Value Ref Range Status  ?03/14/2021 0.93 0.57 - 1.00 mg/dL Final  ?   ?  ?  Passed - Valid encounter within last 6 months  ?  Recent Outpatient Visits   ?      ? 5 days ago Type 2 diabetes mellitus with microalbuminuria, without long-term current use of insulin ([place])  ? [facility] [name] T, FNP  ? 1 month ago Anemia in chronic kidney disease, unspecified CKD stage  ? [place], DO  ? 3 months ago Morbid obesity ([place])  ? [facility] Gwyneth Sprout, FNP  ? 5 months ago Type 2 diabetes mellitus with microalbuminuria, without long-term current use of insulin ([place])  ? [facility] [name] T, FNP  ? 6 months ago Encounter for [employer] annual wellness exam  ? [place], [place]  ?  ?  ?Future Appointments   ?        ? In 2 months Gwyneth Sprout, [place], PEC  ?  ? ?  ?  ?  ? ?

## 2021-11-06 ENCOUNTER — Telehealth: Payer: Self-pay

## 2021-11-06 NOTE — Telephone Encounter (Signed)
-----   Message from Excell Seltzer, RN sent at 11/02/2021  8:45 AM EDT ----- Regarding: referral Schedule with PA or any midwife for lack of sexual desire in 4-5 weeks.

## 2021-11-06 NOTE — Telephone Encounter (Signed)
[facility] referring for lack of sexual desire schedule with PA or Midwife. Called and left voicemail for patient to call back to be scheduled.

## 2021-11-07 ENCOUNTER — Telehealth: Payer: Self-pay

## 2021-11-07 NOTE — Telephone Encounter (Signed)
Patient is scheduled 11/17/21 with MM[facility]

## 2021-11-07 NOTE — Progress Notes (Signed)
? ? ?Chronic Care Management ?Pharmacy Assistant  ? ?Name: Barbara Anderson  MRN: [id number] DOB: 1970/11/22 ? ?Reason for Encounter:Diabetes Disease State Call. ?  ?Recent office visits:  ?10/26/2021 [name] FNP (PCP) NO Medication Changes noted, Ambulatory referral to Gynecology ? ?Recent consult visits:  ?10/31/2021 Genene Churn MPH (Bariatrics) Vitamin D 2000 IU BID @ start of week s/p surgery, follow up in 2 weeks ?10/23/2021 Dr. Hardin Negus MD (Pain medicine) No Medication changes noted, follow up in 3 months ?10/20/2021 [name] MPH (Bariatrics) No Medication Changes noted, follow up in 2-4 weeks ?10/17/2021  Dr. Evorn Gong MD [facility]) No medication changes noted ?10/11/2021 Dr. Evorn Gong MD (Bariatrics) No medication changes noted ?09/14/2021 Mai Vue LCSW [facility]) No Medication Changes noted ? ?Hospital visits:  ?None in previous 6 months ? ?Medications: ?Outpatient Encounter Medications as of 11/07/2021  ?Medication Sig  ? Adapalene (DIFFERIN) 0.3 % gel Apply 1 application topically at bedtime.  ? Alcohol Swabs (B-D SINGLE USE SWABS REGULAR) PADS USE TO CLEANSE SKIN EVERY DAY BEFORE CHECKING BLOOD SUGAR  ? ALPRAZolam (XANAX) 1 MG tablet Take 1 mg by mouth 2 (two) times daily as needed.   ? amphetamine-dextroamphetamine (ADDERALL) 20 MG tablet Take 1 tablet (20 mg total) by mouth in the morning, at noon, and at bedtime. Has not started yet 11/10/19  ? ARIPiprazole (ABILIFY) 5 MG tablet Take 5 mg by mouth daily.  ? atorvastatin (LIPITOR) 20 MG tablet Take 1 tablet (20 mg total) by mouth daily.  ? Cholecalciferol (VITAMIN D3) 25 MCG (1000 UT) CAPS Take 1 capsule (1,000 Units total) by mouth daily.  ? DULoxetine (CYMBALTA) 60 MG capsule Take 2 capsules (120 mg total) by mouth daily.  ? DULoxetine (CYMBALTA) 60 MG capsule Take by mouth.  ? ferrous sulfate 325 (65 FE) MG tablet Take by mouth.  ? fluticasone (FLONASE) 50 MCG/ACT nasal spray USE 2 SPRAYS IN BOTH  NOSTRILS DAILY  ? folic acid (FOLVITE) 081 MCG  tablet Take 1 tablet (800 mcg total) by mouth daily.  ? gabapentin (NEURONTIN) 300 MG capsule Take 1 capsule (300 mg total) by mouth 3 (three) times daily.  ? Lancets Misc. (ACCU-CHEK FASTCLIX LANCET) KIT To check blood sugar once daily  ? levothyroxine (SYNTHROID) 25 MCG tablet TAKE 1 TABLET BY MOUTH  DAILY BEFORE BREAKFAST  ? lisinopril (ZESTRIL) 20 MG tablet Take 1 tablet (20 mg total) by mouth daily.  ? metoprolol succinate (TOPROL-XL) 25 MG 24 hr tablet Take 1 tablet (25 mg total) by mouth daily.  ? OZEMPIC, 1 MG/DOSE, 4 MG/3ML SOPN INJECT 1 MG ONCE WEEK AS DIRECTED  ? QUEtiapine Fumarate (SEROQUEL XR) 150 MG 24 hr tablet Take 150 mg by mouth at bedtime.   ? spironolactone (ALDACTONE) 25 MG tablet Take 1 tablet (25 mg total) by mouth daily.  ? tiZANidine (ZANAFLEX) 2 MG tablet Take 1 tablet (2 mg total) by mouth 3 (three) times daily as needed for muscle spasms.  ? topiramate (TOPAMAX) 200 MG tablet Take 1 tablet (200 mg total) by mouth at bedtime.  ? traZODone (DESYREL) 150 MG tablet Take 1 tablet (150 mg total) by mouth at bedtime.  ? VRAYLAR 1.5 MG capsule Take 1.5 mg by mouth daily.  ? ?No facility-administered encounter medications on file as of 11/07/2021.  ? ?Care Gaps: ?COVID-19 Vaccine ?  ?Star Rating Drugs: ?Lisinopril 20 mg las filled 08/16/2021 100 day supply at [facility]. ?Atorvastatin 20 mg last filled 10/19/2021 100 day supply at [facility] ?Ozempic 0.25/0.5 last filled  11/21/2021 84 day supply at [facility].  ?  ?Medication Fill gaps: ?Aripiprazole  5 MG last filled 12/09/2020 30 day supply ? ?Recent Relevant Labs: ?Lab Results  ?Component Value Date/Time  ? HGBA1C 6.6 (A) 10/26/2021 02:21 PM  ? HGBA1C 6.1 (A) 05/12/2021 03:36 PM  ? HGBA1C 6.3 (H) 01/24/2021 09:13 AM  ? HGBA1C 5.9 (H) 02/04/2020 02:58 PM  ? MICROALBUR 20 02/04/2020 02:46 PM  ? MICROALBUR 50 07/21/2018 04:34 PM  ?  ?Kidney Function ?Lab Results  ?Component Value Date/Time  ? CREATININE 0.93 03/14/2021 01:40 PM  ?  CREATININE 1.10 (H) 01/24/2021 09:13 AM  ? CREATININE 0.88 05/12/2014 07:47 PM  ? CREATININE 0.79 04/22/2014 03:03 PM  ? CREATININE 1.03 10/21/2013 08:28 PM  ? GFRNONAA 66 02/04/2020 02:58 PM  ? GFRNONAA >60 10/21/2013 08:28 PM  ? GFRAA 76 02/04/2020 02:58 PM  ? GFRAA >60 10/21/2013 08:28 PM  ? ? ?Current antihyperglycemic regimen:  ?Ozempic 1 mg weekly ? ?What recent interventions/DTPs have been made to improve glycemic control:  ?None ID ? ?Have there been any recent hospitalizations or ED visits since last visit with CPP? No ? ?Patient denies hypoglycemic symptoms, including Pale, Sweaty, Shaky, Hungry, Nervous/irritable, and Vision changes ? ?Patient denies hyperglycemic symptoms, including blurry vision, excessive thirst, fatigue, polyuria, and weakness ? ?How often are you checking your blood sugar?  ?Patient states she checks her blood sugar every once in awhile. ? ?What are your blood sugars ranging?  ?Patient states her blood sugar ranges around 92. ? ?During the week, how often does your blood glucose drop below 70? Never ? ?Are you checking your feet daily/regularly?  ? Yes, patient denies any pain,numbness or tingling sensations in her feet. ? ?Adherence Review: ?Is the patient currently on a STATIN medication? Yes ?Is the patient currently on ACE/ARB medication? No ?Does the patient have >5 day gap between last estimated fill dates? No ? ? ?Telephone follow up appointment with Care management team member scheduled for : 01/09/2022 at 3:00 pm. ? ?Anderson Malta ?Clinical Pharmacist Assistant ?[phone]  ? ? ?

## 2021-11-08 ENCOUNTER — Telehealth: Payer: Self-pay | Admitting: *Deleted

## 2021-11-08 NOTE — Telephone Encounter (Signed)
? ?  Telephone encounter was:  Unsuccessful.  11/08/2021 ?Name: Barbara Anderson MRN: [id number] DOB: 01-21-1971 ? ?Unsuccessful outbound call made today to assist with:  Transportation Needs  ? ?Outreach Attempt:  1st Attempt ? ?Voicemail is full unable to leave message ? ? ?Lovett Sox -Selinda Eon ?Care Guide , Embedded Care Coordination ?, Care Management  ?[phone] ?[address] , [place]  [address] ?Email : Ashby Dawes. Greenauer-moran [email or web address] ?  ? ?

## 2021-11-10 ENCOUNTER — Telehealth: Payer: Self-pay | Admitting: *Deleted

## 2021-11-10 NOTE — Telephone Encounter (Signed)
? ?  Telephone encounter was:  Unsuccessful.  11/10/2021 ?Name: Barbara Anderson MRN: [id number] DOB: 03/25/1971 ? ?Unsuccessful outbound call made today to assist with:  Voicemail full ? ?Outreach Attempt:  2nd Attempt ? ?Voicemail full ? ?[name] ?Care Guide , Embedded Care Coordination ?[place], Care Management  ?[phone] ?[address] , [place] [address] ?Email : Ashby [email or web address]. Greenauer-moran [email or web address] ?  ?

## 2021-11-16 ENCOUNTER — Telehealth: Payer: Self-pay | Admitting: *Deleted

## 2021-11-16 NOTE — Telephone Encounter (Signed)
? ?  Telephone encounter was:  Unsuccessful.  11/16/2021 ?Name: Barbara Anderson MRN: [id number] DOB: 1970/10/14 ? ?Unsuccessful outbound call made today to assist with:  Transportation Needs  and Financial Difficulties related to housing ? ?Outreach Attempt:  3rd Attempt.  Referral closed unable to contact patient. ? ?A HIPAA compliant voice message was left requesting a return call.  Instructed patient to call back at   Instructed patient to call back at [phone]  at their earliest convenience. Marland Kitchen ?Lovett Sox -Selinda Eon ?Care Guide , Embedded Care Coordination ?[place], Care Management  ?[phone] ?[address] , [place] [address] ?Email : Ashby Dawes. Greenauer-moran [email or web address] ?  ? ?

## 2021-11-17 ENCOUNTER — Ambulatory Visit: Payer: 59 | Admitting: Obstetrics

## 2021-11-28 ENCOUNTER — Other Ambulatory Visit: Payer: Self-pay | Admitting: Family Medicine

## 2021-11-28 ENCOUNTER — Encounter: Payer: Self-pay | Admitting: Family Medicine

## 2021-11-28 ENCOUNTER — Ambulatory Visit (INDEPENDENT_AMBULATORY_CARE_PROVIDER_SITE_OTHER): Payer: 59 | Admitting: Family Medicine

## 2021-11-28 VITALS — BP 120/80 | Ht 67.0 in | Wt 282.0 lb

## 2021-11-28 DIAGNOSIS — R6882 Decreased libido: Secondary | ICD-10-CM | POA: Diagnosis not present

## 2021-11-28 DIAGNOSIS — F520 Hypoactive sexual desire disorder: Secondary | ICD-10-CM | POA: Diagnosis not present

## 2021-11-28 DIAGNOSIS — E782 Mixed hyperlipidemia: Secondary | ICD-10-CM

## 2021-11-28 DIAGNOSIS — L700 Acne vulgaris: Secondary | ICD-10-CM

## 2021-11-28 MED ORDER — ADDYI 100 MG PO TABS: 100.0000 mg | ORAL_TABLET | Freq: Every day | ORAL | 10 refills | Status: DC

## 2021-11-28 NOTE — Progress Notes (Signed)
? ?  GYNECOLOGY PROBLEM  VISIT ENCOUNTER NOTE ? ?Subjective:  ? Barbara Anderson is a 51 y.o. G67P0030 female here for a problem GYN visit.   ? ?Current complaints: low sex drive and inability to orgasm, referral from PCP ? ?Reports this is a long standing issue. She reports even in her 51s she did not have orgasms. She had more libido. She reports she had experienced orgasm with vibrators but only occasionally. She wants this consult because she "wants to want sex like she should"  ? ?Her PCP had tried to order Addyi but insurance did not cover RX.  ? ?She is with a stable partner. She reports she enjoys sex but has been faking orgasm with her partner for years. She is not interested in having sex as much as she "should" and when asked what this looked like it was 3-4 times per week. She does not want to hurt her partner's feelings and has not talked to him about any of these issues. ? ?We reviewed her medication and conditions together.  ? ?Denies abnormal vaginal bleeding, discharge, pelvic pain, problems with intercourse or other gynecologic concerns.  ?  ?Gynecologic History ?Patient's last menstrual period was 11/24/2021. ? ?Contraception: none ? ?Health Maintenance Due  ?Topic Date Due  ? COVID-19 Vaccine (1) Never done  ? ? ?The following portions of the patient's history were reviewed and updated as appropriate: allergies, current medications, past family history, past medical history, past social history, past surgical history and problem list. ? ?Review of Systems ?Pertinent items are noted in HPI. ?  ?Objective:  ?BP 120/80   Ht '5\' 7"'$  (1.702 m)   Wt 282 lb (127.9 kg)   LMP 11/24/2021   BMI 44.17 kg/m?  ?Gen: well appearing, NAD ?HEENT: no scleral icterus ?CV: RR ?Lung: Normal WOB ?Ext: warm well perfused ? ?PELVIC: deferred. ? ? ?Assessment and Plan:  ?1. Low libido ?We had a lengthy discussion about intimacy, sexual desire and having open conversations with her partner. Additionally I reviewed in detail  the multiple medications and medical conditions the patient has that effect libido and sexual function. Specifically we discussed that medications like seroquel, Cymbalta, abilify and topomax can effect orgasm. Reviewed that no medication is an easy or quick fix.  ? ?Patient had not had TSH recently and is on levothyroxine. ? ?We also discussed onset of menopause, she is still having monthly cycles.  ? ?I offered an Rx for ADDYI and patient accepted. I sent a prescription to her typical pharmacy and also printed a copy so she could explore GoodRx to find better pricing.  ?- TSH ?- Follicle stimulating hormone ?- Flibanserin (ADDYI) 100 MG TABS; Take 100 mg by mouth at bedtime.  Dispense: 30 tablet; Refill: 10 ? ? ?Face to face time:  40 minutes ? ?Greater than 50% of the visit time was spent in counseling and coordination of care with the patient.  The summary and outline of the counseling and care coordination is summarized in the note above. ? ? All questions were answered. ? ?Please refer to After Visit Summary for other counseling recommendations.  ? ?Return if symptoms worsen or fail to improve. ? ?Caren Macadam, MD, MPH, ABFM ?Attending Physician ?[place] [facility] ? ?

## 2021-11-29 LAB — TSH: TSH: 1.41 u[IU]/mL (ref 0.450–4.500)

## 2021-11-29 LAB — FOLLICLE STIMULATING HORMONE: FSH: 11.2 m[IU]/mL

## 2021-11-29 NOTE — Telephone Encounter (Signed)
Pharmacy request for 1 year supply- too soon ?Rx for both 10/19/21 6 month supply ?Requested Prescriptions  ?Pending Prescriptions Disp Refills  ?? spironolactone (ALDACTONE) 25 MG tablet [Pharmacy Med Name: Spironolactone 25 MG Oral Tablet] 100 tablet 2  ?  Sig: TAKE 1 TABLET BY MOUTH DAILY  ?  ? Cardiovascular: Diuretics - Aldosterone Antagonist Failed - 11/28/2021 10:22 PM  ?  ?  Failed - Cr in normal range and within 180 days  ?  Creatinine  ?Date Value Ref Range Status  ?05/12/2014 0.88 0.60 - 1.30 mg/dL Final  ? ?Creat  ?Date Value Ref Range Status  ?04/22/2014 0.79 0.50 - 1.10 mg/dL Final  ? ?Creatinine, Ser  ?Date Value Ref Range Status  ?03/14/2021 0.93 0.57 - 1.00 mg/dL Final  ?   ?  ?  Failed - K in normal range and within 180 days  ?  Potassium  ?Date Value Ref Range Status  ?03/14/2021 4.0 3.5 - 5.2 mmol/L Final  ?05/12/2014 3.0 (L) 3.5 - 5.1 mmol/L Final  ?   ?  ?  Failed - Na in normal range and within 180 days  ?  Sodium  ?Date Value Ref Range Status  ?03/14/2021 141 134 - 144 mmol/L Final  ?05/12/2014 144 136 - 145 mmol/L Final  ?   ?  ?  Failed - eGFR is 30 or above and within 180 days  ?  EGFR (African American)  ?Date Value Ref Range Status  ?10/21/2013 >60  Final  ? ?GFR calc Af Wyvonnia Lora  ?Date Value Ref Range Status  ?02/04/2020 76 >59 mL/min/1.73 Final  ?  Comment:  ?  **[facility] currently reports eGFR in compliance with the current** ?  recommendations of the [employer]. [facility] will ?  update reporting as new guidelines are published from the NKF-ASN ?  Task force. ?  ? ?EGFR (Non-African Amer.)  ?Date Value Ref Range Status  ?10/21/2013 >60  Final  ?  Comment:  ?  eGFR values <46m/min/1.73 m2 may be an indication of chronic ?kidney disease (CKD). ?Calculated eGFR is useful in patients with stable renal function. ?The eGFR calculation will not be reliable in acutely ill patients ?when serum creatinine is changing rapidly. It is not useful in  ?patients on dialysis. The eGFR  calculation may not be applicable ?to patients at the low and high extremes of body sizes, pregnant ?women, and vegetarians. ?  ? ?GFR calc non Af Amer  ?Date Value Ref Range Status  ?02/04/2020 66 >59 mL/min/1.73 Final  ? ?eGFR  ?Date Value Ref Range Status  ?03/14/2021 74 >59 mL/min/1.73 Final  ?   ?  ?  Passed - Last BP in normal range  ?  BP Readings from Last 1 Encounters:  ?11/28/21 120/80  ?   ?  ?  Passed - Valid encounter within last 6 months  ?  Recent Outpatient Visits   ?      ? 1 month ago Type 2 diabetes mellitus with microalbuminuria, without long-term current use of insulin ([place]  ? [facility]  ? 2 months ago Anemia in chronic kidney disease, unspecified CKD stage  ? [place]  ? 4 months ago Morbid obesity ([facility]  ? [facility]  ? 6 months ago Type 2 diabetes mellitus with microalbuminuria, without long-term current use of insulin ([place]  ? [facility]  ? 7  months ago Encounter for [employer] annual wellness exam  ? [place], [place]  ?  ?  ?Future Appointments   ?        ? In 1 month Gwyneth Sprout, [place], PEC  ?  ? ?  ?  ?  ?? atorvastatin (LIPITOR) 20 MG tablet [Pharmacy Med Name: Atorvastatin Calcium 20 MG Oral Tablet] 100 tablet 2  ?  Sig: TAKE 1 TABLET BY MOUTH DAILY  ?  ? Cardiovascular:  Antilipid - Statins Failed - 11/28/2021 10:22 PM  ?  ?  Failed - Lipid Panel in normal range within the last 12 months  ?  Cholesterol, Total  ?Date Value Ref Range Status  ?01/24/2021 175 100 - 199 mg/dL Final  ? ?LDL Chol Calc (NIH)  ?Date Value Ref Range Status  ?01/24/2021 104 (H) 0 - 99 mg/dL Final  ? ?HDL  ?Date Value Ref Range Status  ?01/24/2021 50 >39 mg/dL Final  ? ?Triglycerides  ?Date Value Ref Range Status  ?01/24/2021 114 0 - 149 mg/dL Final  ? ?  ?  ?  Passed - Patient is not pregnant  ?  ?  Passed  - Valid encounter within last 12 months  ?  Recent Outpatient Visits   ?      ? 1 month ago Type 2 diabetes mellitus with microalbuminuria, without long-term current use of insulin ([place])  ? [facility] [name] T, FNP  ? 2 months ago Anemia in chronic kidney disease, unspecified CKD stage  ? [place], DO  ? 4 months ago Morbid obesity [facility])  ? [facility] [name] T, FNP  ? 6 months ago Type 2 diabetes mellitus with microalbuminuria, without long-term current use of insulin ([place])  ? [facility] Gwyneth Sprout, FNP  ? 7 months ago Encounter for [employer] annual wellness exam  ? [place], [place]  ?  ?  ?Future Appointments   ?        ? In 1 month Gwyneth Sprout, [place], PEC  ?  ? ?  ?  ?  ? ?

## 2021-11-30 ENCOUNTER — Telehealth: Payer: Self-pay | Admitting: Family Medicine

## 2021-11-30 NOTE — Telephone Encounter (Signed)
Spoke with Roena and asked her to verify the prescription she was needing a PA for and she said it was the ADDYI, asked her has she tried the [employer] card and she stated it would still be over 100$. Shirlean Mylar stated that her pharmacy told her a PA was sent to our office.  ?

## 2021-11-30 NOTE — Telephone Encounter (Signed)
Called Barbara Anderson back as we did not see a PA form so went on covermymeds to start a PA for her Barbara Anderson, it came back as a PA wasn't required for that medication. Barbara Anderson stated she would call her pharmacy to see what can be done and told her if they are saying a PA is needed to please refax the PA form to our office. ?

## 2021-11-30 NOTE — Telephone Encounter (Signed)
Pt called in about a RX that [facility] prescribed for her.  Pharm is telling her that it needs prior authorization before it can be filled.  Will you please check into this? ?

## 2021-11-30 NOTE — Telephone Encounter (Signed)
Called Barbara Anderson back and left voicemail. ?

## 2021-12-04 ENCOUNTER — Other Ambulatory Visit: Payer: Self-pay | Admitting: Family Medicine

## 2021-12-08 ENCOUNTER — Other Ambulatory Visit: Payer: Self-pay | Admitting: Family Medicine

## 2021-12-08 DIAGNOSIS — E782 Mixed hyperlipidemia: Secondary | ICD-10-CM

## 2021-12-08 DIAGNOSIS — J0140 Acute pansinusitis, unspecified: Secondary | ICD-10-CM

## 2021-12-08 DIAGNOSIS — L700 Acne vulgaris: Secondary | ICD-10-CM

## 2022-01-08 ENCOUNTER — Telehealth: Payer: Self-pay

## 2022-01-08 NOTE — Progress Notes (Cosign Needed)
Chronic Care Management APPOINTMENT REMINDER   Called Barbara Anderson, No answer, left message of appointment on 01/09/2022 at 3:00 pm  via telephone visit with Junius Argyle , Pharm D. Notified to have all medications, supplements, blood pressure and/or blood sugar logs available during appointment and to return call if need to reschedule.  [place] Pharmacist Assistant [phone]

## 2022-01-09 ENCOUNTER — Telehealth: Payer: Medicare HMO

## 2022-01-09 NOTE — Progress Notes (Deleted)
Chronic Care Management Pharmacy Note  01/09/2022 Name:  Barbara Anderson MRN:  [id number] DOB:  02-03-1971  Summary: ***  Recommendations/Changes made from today's visit: ***  Plan: ***   Subjective: Barbara Anderson is an 51 y.o. year old female who is a primary patient of Gwyneth Sprout, [place].  The CCM team was consulted for assistance with disease management and care coordination needs.    {CCMTELEPHONEFACETOFACE:21091510} for {CCMINITIALFOLLOWUPCHOICE:21091511} in response to provider referral for pharmacy case management and/or care coordination services.   Consent to Services:  {CCMCONSENTOPTIONS:25074}  Patient Care Team: Gwyneth Sprout, FNP as PCP - General (Family Medicine) [place] Crane, MD as Consulting Physician (Psychiatry) Lavonia Dana, MD as Consulting Physician (Nephrology) Chinita Pester, MD as Referring Physician (Anesthesiology) Lorelee Cover., MD (Ophthalmology) Germaine Pomfret, [facility] as Pharmacist (Pharmacist)  Recent office visits: ***  Recent consult visits: [facility] visits: {Hospital DC Yes/No:25215}   Objective:  Lab Results  Component Value Date   CREATININE 0.93 03/14/2021   BUN 13 03/14/2021   EGFR 74 03/14/2021   GFRNONAA 66 02/04/2020   GFRAA 76 02/04/2020   NA 141 03/14/2021   K 4.0 03/14/2021   CALCIUM 9.1 03/14/2021   CO2 21 03/14/2021   GLUCOSE 103 (H) 03/14/2021    Lab Results  Component Value Date/Time   HGBA1C 6.6 (A) 10/26/2021 02:21 PM   HGBA1C 6.1 (A) 05/12/2021 03:36 PM   HGBA1C 6.3 (H) 01/24/2021 09:13 AM   HGBA1C 5.9 (H) 02/04/2020 02:58 PM   MICROALBUR 20 02/04/2020 02:46 PM   MICROALBUR 50 07/21/2018 04:34 PM    Last diabetic Eye exam:  Lab Results  Component Value Date/Time   HMDIABEYEEXA No Retinopathy 07/27/2021 08:31 AM    Last diabetic Foot exam: No results found for: "HMDIABFOOTEX"   Lab Results  Component Value Date   CHOL 175 01/24/2021   HDL 50 01/24/2021   LDLCALC 104 (H)  01/24/2021   TRIG 114 01/24/2021   CHOLHDL 3.5 01/24/2021       Latest Ref Rng & Units 01/24/2021    9:13 AM 02/04/2020    2:58 PM 10/05/2019    3:28 PM  Hepatic Function  Total Protein 6.0 - 8.5 g/dL 6.2  5.8  6.9   Albumin 3.8 - 4.9 g/dL 3.6  3.8  4.2   AST 0 - 40 IU/L 40  22  18   ALT 0 - 32 IU/L _0 Alk Phosphatase 44 - 121 IU/L 93  98  155   Total Bilirubin 0.0 - 1.2 mg/dL 0.2  0.2  0.3     Lab Results  Component Value Date/Time   TSH 1.410 11/28/2021 09:44 AM   TSH 1.790 09/12/2021 10:57 AM   FREET4 1.00 09/26/2018 03:10 PM   FREET4 0.78 (L) 08/16/2018 01:48 AM       Latest Ref Rng & Units 09/12/2021   10:57 AM 01/24/2021    9:13 AM 02/04/2020    2:58 PM  CBC  WBC 3.4 - 10.8 x10E3/uL 6.5  4.7  11.2   Hemoglobin 11.1 - 15.9 g/dL 12.3  12.6  12.3   Hematocrit 34.0 - 46.6 % 35.8  37.8  36.6   Platelets 150 - 450 x10E3/uL 256  160  290     Lab Results  Component Value Date/Time   VD25OH 28.8 (L) 02/04/2020 02:58 PM   VD25OH 40.1 01/01/2019 02:57 PM    Clinical ASCVD: {YES/NO:21197} The 10-year ASCVD risk  score (Arnett DK, et al., 2019) is: 3.3%   Values used to calculate the score:     Age: 10 years     Sex: Female     Is Non-Hispanic African American: No     Diabetic: Yes     Tobacco smoker: No     Systolic Blood Pressure: 606 mmHg     Is BP treated: Yes     HDL Cholesterol: 50 mg/dL     Total Cholesterol: 175 mg/dL       10/26/2021    2:25 PM 05/12/2021    3:37 PM 04/28/2021   12:54 PM  Depression screen PHQ 2/9  Decreased Interest _0 Down, Depressed, Hopeless _1 PHQ - 2 Score _2 Altered sleeping _3 Tired, decreased energy _4 Change in appetite _5 Feeling bad or failure about yourself  _6 Trouble concentrating _7 Moving slowly or fidgety/restless 1 0 2  Suicidal thoughts 0 0 0  PHQ-9 Score _8 Difficult doing work/chores Extremely dIfficult Extremely dIfficult Extremely dIfficult     ***Other:  (CHADS2VASc if Afib, MMRC or CAT for COPD, ACT, DEXA)  Social History   Tobacco Use  Smoking Status Never  Smokeless Tobacco Never   BP Readings from Last 3 Encounters:  11/28/21 120/80  10/26/21 140/80  09/12/21 125/75   Pulse Readings from Last 3 Encounters:  10/26/21 71  09/12/21 73  07/26/21 76   Wt Readings from Last 3 Encounters:  11/28/21 282 lb (127.9 kg)  10/26/21 284 lb 1.6 oz (128.9 kg)  09/12/21 276 lb 6.4 oz (125.4 kg)   BMI Readings from Last 3 Encounters:  11/28/21 44.17 kg/m  10/26/21 44.50 kg/m  09/12/21 43.29 kg/m    Assessment/Interventions: Review of patient past medical history, allergies, medications, health status, including review of consultants reports, laboratory and other test data, was performed as part of comprehensive evaluation and provision of chronic care management services.   SDOH:  (Social Determinants of Health) assessments and interventions performed: {yes/no:20286}  SDOH Screenings   Alcohol Screen: Low Risk  (05/12/2021)   Alcohol Screen    Last Alcohol Screening Score (AUDIT): 0  Depression (PHQ2-9): Medium Risk (10/26/2021)   Depression (PHQ2-9)    PHQ-2 Score: 15  Financial Resource Strain: Low Risk  (08/09/2020)   Overall Financial Resource Strain (CARDIA)    Difficulty of Paying Living Expenses: Not hard at all  Food Insecurity: Food Insecurity Present (04/28/2021)   Hunger Vital Sign    Worried About Running Out of Food in the Last Year: Often true    Ran Out of Food in the Last Year: Often true  Housing: High Risk (04/28/2021)   Housing    Last Housing Risk Score: 2  Physical Activity: Inactive (04/28/2021)   Exercise Vital Sign    Days of Exercise per Week: 0 days    Minutes of Exercise per Session: 0 min  Social Connections: Socially Isolated (04/28/2021)   Social Connection and Isolation Panel [NHANES]    Frequency of Communication with Friends and Family: Three times a week    Frequency of Social Gatherings with  Friends and Family: Never    Attends Religious Services: Never    Marine scientist or Organizations: No    Attends Archivist Meetings: Never    Marital Status: Divorced  Stress: Stress Concern Present (04/28/2021)  [place] Questionnaire    Feeling of Stress : Very much  Tobacco Use: Low Risk  (11/28/2021)   Patient History    Smoking Tobacco Use: Never    Smokeless Tobacco Use: Never    Passive Exposure: Not on file  Transportation Needs: Unmet Transportation Needs (04/28/2021)   PRAPARE - Transportation    Lack of Transportation (Medical): Yes    Lack of Transportation (Non-Medical): Yes    CCM Care Plan  Allergies  Allergen Reactions   Metformin And Related Diarrhea    Medications Reviewed Today     Reviewed by Caren Macadam, MD (Physician) on 11/28/21 at 0910  Med List Status: <None>   Medication Order Taking? Sig Documenting Provider Last Dose Status Informant  Adapalene (DIFFERIN) 0.3 % gel [id number] Yes Apply 1 application topically at bedtime. Ralene Bathe, MD Taking Active   Alcohol Swabs (B-D SINGLE USE SWABS REGULAR) PADS [id number] Yes USE TO CLEANSE SKIN EVERY DAY BEFORE CHECKING BLOOD SUGAR Mar Daring, PA-C Taking Active   ALPRAZolam Duanne Moron) 1 MG tablet [id number] Yes Take 1 mg by mouth 2 (two) times daily as needed.  [provider] Taking Active   amphetamine-dextroamphetamine (ADDERALL) 20 MG tablet [id number] Yes Take 1 tablet (20 mg total) by mouth in the morning, at noon, and at bedtime. Has not started yet 11/10/19 Mar Daring, PA-C Taking Active   ARIPiprazole (ABILIFY) 5 MG tablet [id number] Yes Take 5 mg by mouth daily. [provider] Taking Active   atorvastatin (LIPITOR) 20 MG tablet [id number] Yes Take 1 tablet (20 mg total) by mouth daily. Gwyneth Sprout, FNP Taking Active   Cholecalciferol (VITAMIN D3) 25 MCG (1000 UT) CAPS [id number] Yes  Take 1 capsule (1,000 Units total) by mouth daily. Gwyneth Sprout, FNP Taking Active   DULoxetine (CYMBALTA) 60 MG capsule [id number] Yes Take 2 capsules (120 mg total) by mouth daily. Pucilowska, Wardell Honour, MD Taking Active Self  DULoxetine (CYMBALTA) 60 MG capsule [id number] Yes Take by mouth. [provider] Taking Active   ferrous sulfate 325 (65 FE) MG tablet [id number] Yes Take by mouth. [provider] Taking Active   fluticasone (FLONASE) 50 MCG/ACT nasal spray [id number] Yes USE 2 SPRAYS IN BOTH  NOSTRILS DAILY Gwyneth Sprout, FNP Taking Active   folic acid (FOLVITE) 169 MCG tablet [id number] Yes Take 1 tablet (800 mcg total) by mouth daily. Mar Daring, PA-C Taking Active   gabapentin (NEURONTIN) 300 MG capsule [id number] Yes Take 1 capsule (300 mg total) by mouth 3 (three) times daily. Gwyneth Sprout, FNP Taking Active   Lancets Misc. (ACCU-CHEK FASTCLIX LANCET) KIT [id number] Yes To check blood sugar once daily Mar Daring, [place] Taking Active Self  levothyroxine (SYNTHROID) 25 MCG tablet [id number] Yes TAKE 1 TABLET BY MOUTH  DAILY BEFORE Darin Engels, FNP Taking Active   lisinopril (ZESTRIL) 20 MG tablet [id number] Yes Take 1 tablet (20 mg total) by mouth daily. Gwyneth Sprout, FNP Taking Active   metoprolol succinate (TOPROL-XL) 25 MG 24 hr tablet [id number] Yes Take 1 tablet (25 mg total) by mouth daily. Gwyneth Sprout, FNP Taking Active   OZEMPIC, 1 MG/DOSE, 4 MG/3ML Bonney Aid [id number] Yes INJECT 1 MG ONCE WEEK AS DIRECTED Gwyneth Sprout, FNP Taking Active   QUEtiapine Fumarate (SEROQUEL XR) 150 MG 24 hr tablet [id number] Yes Take 150 mg by mouth at bedtime.  [provider] Taking Active  spironolactone (ALDACTONE) 25 MG tablet [id number] Yes Take 1 tablet (25 mg total) by mouth daily. Gwyneth Sprout, FNP Taking Active   tiZANidine (ZANAFLEX) 2 MG tablet [id number] Yes Take 1 tablet (2 mg total) by mouth 3 (three) times daily as needed for  muscle spasms. Gwyneth Sprout, FNP Taking Active   topiramate (TOPAMAX) 200 MG tablet [id number] Yes Take 1 tablet (200 mg total) by mouth at bedtime. Clovis Fredrickson, MD Taking Active Self  traZODone (DESYREL) 150 MG tablet [id number] Yes Take 1 tablet (150 mg total) by mouth at bedtime. Clovis Fredrickson, MD Taking Active Self  VRAYLAR 1.5 MG capsule [id number] Yes Take 1.5 mg by mouth daily. [provider] Taking Active             Patient Active Problem List   Diagnosis Date Noted   Lack of sexual desire 10/26/2021   Inhibited female orgasm 10/26/2021   Low libido 09/12/2021   Chronic midline low back pain with right-sided sciatica 07/26/2021   Muscle spasm of right leg 07/26/2021   Use of cane as ambulatory aid 07/26/2021   Need for shingles vaccine 07/26/2021   Hypothyroidism due to acquired atrophy of thyroid 05/12/2021   Need for influenza vaccination 05/12/2021   Morbid obesity ([place]) 05/12/2021   Depression, recurrent ([place]) 05/12/2021   Weight loss counseling, encounter for 05/12/2021   BMI 40.0-44.9, adult ([place]) 05/12/2021   Hyperlipidemia associated with type 2 diabetes mellitus ([place]) 02/20/2021   Palpitations 02/20/2021   Hypothyroid 08/09/2020   Anemia in chronic kidney disease 06/15/2019   Proteinuria 06/15/2019   Stage 3a chronic kidney disease ([place]) 06/15/2019   Lymphedema 09/03/2018   Type 2 diabetes mellitus with microalbuminuria, without long-term current use of insulin ([place]) 07/21/2018   Suicidal ideation 04/11/2017   Hypertension associated with diabetes ([place]) 04/11/2017   Chronic pain syndrome 04/11/2017   PTSD (post-traumatic stress disorder) 04/11/2017   OCD (obsessive compulsive disorder) 04/11/2017   Panic disorder with agoraphobia 04/11/2017   TBI (traumatic brain injury) ([place]) 04/11/2017   Bipolar I disorder, current or most recent episode depressed, with psychotic features ([place]) 04/10/2017   Difficulty concentrating 11/24/2015    Abnormal bruising 10/26/2015   Fibrositis 10/26/2015   Cervical pain 10/26/2015   Disorder of labyrinth 10/26/2015   Oligomenorrhea 08/30/2015   Costochondritis 04/14/2015   Clinical depression 09/21/2008   Late effect of certain complications of trauma 37/85/8850   Headache, variant migraine, intractable 04/16/2006    Immunization History  Administered Date(s) Administered   Influenza,inj,Quad PF,6+ Mos 05/09/2017, 05/21/2018, 05/12/2021   PNEUMOCOCCAL CONJUGATE-20 07/26/2021   Pneumococcal Polysaccharide-23 07/21/2018   Zoster Recombinat (Shingrix) 07/26/2021, 10/26/2021    Conditions to be addressed/monitored:  {USCCMDZASSESSMENTOPTIONS:23563}  There are no care plans that you recently modified to display for this patient.    Medication Assistance: {MEDASSISTANCEINFO:25044}  Compliance/Adherence/Medication fill history: Care Gaps: ***  Star-Rating Drugs: ***  Patient's preferred pharmacy is:  [facility] - [place], [address] [place] [address] Phone: [phone] Fax: Velma Del[place], [place] - [place] AT [facility] [address] [address] Phone: [phone] Fax: [phone]  Uses pill box? {Yes or If no, why not?:20788} Pt endorses ***% compliance  We discussed: {Pharmacy options:24294} Patient decided to: {US Pharmacy Plan:23885}  Care Plan and Follow Up Patient Decision:  {FOLLOWUP:24991}  Plan: {CM FOLLOW UP PLAN:25073}  ***

## 2022-01-13 ENCOUNTER — Other Ambulatory Visit: Payer: Self-pay | Admitting: Family Medicine

## 2022-01-13 DIAGNOSIS — E782 Mixed hyperlipidemia: Secondary | ICD-10-CM

## 2022-01-15 ENCOUNTER — Other Ambulatory Visit: Payer: Self-pay | Admitting: Family Medicine

## 2022-01-15 DIAGNOSIS — L700 Acne vulgaris: Secondary | ICD-10-CM

## 2022-01-15 DIAGNOSIS — J0140 Acute pansinusitis, unspecified: Secondary | ICD-10-CM

## 2022-01-15 NOTE — Telephone Encounter (Signed)
Requested medication (s) are due for refill today:   No for both  Requested medication (s) are on the active medication list:   Yes for both  Future visit scheduled:   Yes in 1 wk.   Last ordered: Flonase 12/11/2021 48 g, 1 refill;   Aldactone 10/19/2021 #90, 1 refill  Returned because labs due per protocol   Requested Prescriptions  Pending Prescriptions Disp Refills   fluticasone (FLONASE) 50 MCG/ACT nasal spray [Pharmacy Med Name: Fluticasone Propionate 50 MCG/ACT Nasal Suspension] 64 g     Sig: USE 2 SPRAYS IN BOTH  NOSTRILS DAILY     Ear, Nose, and Throat: Nasal Preparations - Corticosteroids Passed - 01/15/2022  1:54 AM      Passed - Valid encounter within last 12 months    Recent Outpatient Visits           2 months ago Type 2 diabetes mellitus with microalbuminuria, without long-term current use of insulin [facility])   [facility] [name] T, FNP   4 months ago Anemia in chronic kidney disease, unspecified CKD stage   [facility] Myles Gip, DO   5 months ago Morbid obesity [facility])   [facility] [name] T, FNP   8 months ago Type 2 diabetes mellitus with microalbuminuria, without long-term current use of insulin [facility])   [facility] [name] T, FNP   8 months ago Encounter for [employer] annual wellness exam   [facility], [place], [place]       Future Appointments             In 1 week Gwyneth Sprout, [place], [facility]             spironolactone (ALDACTONE) 25 MG tablet [Pharmacy Med Name: Spironolactone 25 MG Oral Tablet] 80 tablet 3    Sig: TAKE 1 TABLET BY MOUTH DAILY     Cardiovascular: Diuretics - Aldosterone Antagonist Failed - 01/15/2022  1:54 AM      Failed - Cr in normal range and within 180 days    Creatinine  Date Value Ref Range Status  05/12/2014 0.88 0.60 - 1.30 mg/dL Final   Creat  Date Value Ref Range Status  04/22/2014 0.79 0.50 -  1.10 mg/dL Final   Creatinine, Ser  Date Value Ref Range Status  03/14/2021 0.93 0.57 - 1.00 mg/dL Final         Failed - K in normal range and within 180 days    Potassium  Date Value Ref Range Status  03/14/2021 4.0 3.5 - 5.2 mmol/L Final  05/12/2014 3.0 (L) 3.5 - 5.1 mmol/L Final         Failed - Na in normal range and within 180 days    Sodium  Date Value Ref Range Status  03/14/2021 141 134 - 144 mmol/L Final  05/12/2014 144 136 - 145 mmol/L Final         Failed - eGFR is 30 or above and within 180 days    EGFR (African American)  Date Value Ref Range Status  10/21/2013 >60  Final   GFR calc Af Amer  Date Value Ref Range Status  02/04/2020 76 >59 mL/min/1.73 Final    Comment:    **[facility] currently reports eGFR in compliance with the current**   recommendations of the [employer]. [facility] will   update reporting as new guidelines are published from the NKF-ASN   Task force.  EGFR (Non-African Amer.)  Date Value Ref Range Status  10/21/2013 >60  Final    Comment:    eGFR values <51m/min/1.73 m2 may be an indication of chronic kidney disease (CKD). Calculated eGFR is useful in patients with stable renal function. The eGFR calculation will not be reliable in acutely ill patients when serum creatinine is changing rapidly. It is not useful in  patients on dialysis. The eGFR calculation may not be applicable to patients at the low and high extremes of body sizes, pregnant women, and vegetarians.    GFR calc non Af Amer  Date Value Ref Range Status  02/04/2020 66 >59 mL/min/1.73 Final   eGFR  Date Value Ref Range Status  03/14/2021 74 >59 mL/min/1.73 Final         Passed - Last BP in normal range    BP Readings from Last 1 Encounters:  11/28/21 120/80         Passed - Valid encounter within last 6 months    Recent Outpatient Visits           2 months ago Type 2 diabetes mellitus with microalbuminuria, without long-term current use of  insulin ([facility]   [facility]   4 months ago Anemia in chronic kidney disease, unspecified CKD stage   [facility]   5 months ago Morbid obesity ([facility]   [facility] F[facility]   8 months ago Type 2 diabetes mellitus with microalbuminuria, without long-term current use of insulin ([facility]   [facility]   8 months ago Encounter for [employer] wellness exam   [facility] [place]      Future Appointments             In 1 week [place]wyneth Sprout [place]

## 2022-01-23 NOTE — Progress Notes (Signed)
Established patient visit   Patient: Barbara Anderson   DOB: 1971/03/31   51 y.o. Female  MRN: [id number] Visit Date: 01/26/2022  Today's healthcare provider: Gwyneth Sprout, FNP  Introduced to nurse practitioner role and practice setting.  All questions answered.  Discussed provider/patient relationship and expectations.   I,Tiffany J Bragg,acting as a scribe for Gwyneth Sprout, FNP.,have documented all relevant documentation on the behalf of Gwyneth Sprout, FNP,as directed by  Gwyneth Sprout, FNP while in the presence of Gwyneth Sprout, FNP.   Chief Complaint  Patient presents with   Diabetes   Subjective    HPI  Diabetes Mellitus Type II, Follow-up  Lab Results  Component Value Date   HGBA1C 6.6 (A) 10/26/2021   HGBA1C 6.1 (A) 05/12/2021   HGBA1C 6.3 (H) 01/24/2021   Wt Readings from Last 3 Encounters:  01/26/22 288 lb 12.8 oz (131 kg)  11/28/21 282 lb (127.9 kg)  10/26/21 284 lb 1.6 oz (128.9 kg)   Last seen for diabetes 3 months ago.  Management since then includes continue current treatment and well balanced diet. She reports excellent compliance with treatment. She is not having side effects.  Symptoms: Yes fatigue No foot ulcerations  No appetite changes No nausea  No paresthesia of the feet  No polydipsia  No polyuria No visual disturbances   No vomiting     Home blood sugar records:  non-fasting between 90-100  Episodes of hypoglycemia? No    Most Recent Eye Exam: 2023 Current exercise: none Current diet habits: well balanced  Pertinent Labs: Lab Results  Component Value Date   CHOL 175 01/24/2021   HDL 50 01/24/2021   LDLCALC 104 (H) 01/24/2021   TRIG 114 01/24/2021   CHOLHDL 3.5 01/24/2021   Lab Results  Component Value Date   NA 141 03/14/2021   K 4.0 03/14/2021   CREATININE 0.93 03/14/2021   EGFR 74 03/14/2021   MICROALBUR 20 02/04/2020   LABMICR 3.2 10/26/2021      ---------------------------------------------------------------------------------------------------   Medications: Outpatient Medications Prior to Visit  Medication Sig   Adapalene (DIFFERIN) 0.3 % gel Apply 1 application topically at bedtime.   Alcohol Swabs (B-D SINGLE USE SWABS REGULAR) PADS USE TO CLEANSE SKIN EVERY DAY BEFORE CHECKING BLOOD SUGAR   ALPRAZolam (XANAX) 1 MG tablet Take 1 mg by mouth 2 (two) times daily as needed.    amphetamine-dextroamphetamine (ADDERALL) 20 MG tablet Take 1 tablet (20 mg total) by mouth in the morning, at noon, and at bedtime. Has not started yet 11/10/19   ARIPiprazole (ABILIFY) 5 MG tablet Take 5 mg by mouth daily.   atorvastatin (LIPITOR) 20 MG tablet Take 1 tablet (20 mg total) by mouth daily.   Cholecalciferol (VITAMIN D3) 25 MCG (1000 UT) CAPS Take 1 capsule (1,000 Units total) by mouth daily.   DULoxetine (CYMBALTA) 60 MG capsule Take 2 capsules (120 mg total) by mouth daily.   DULoxetine (CYMBALTA) 60 MG capsule Take by mouth.   ferrous sulfate 325 (65 FE) MG tablet Take by mouth.   fluticasone (FLONASE) 50 MCG/ACT nasal spray USE 2 SPRAYS IN BOTH NOSTRILS  DAILY   folic acid (FOLVITE) 338 MCG tablet Take 1 tablet (800 mcg total) by mouth daily.   gabapentin (NEURONTIN) 300 MG capsule TAKE 1 CAPSULE BY MOUTH 3 TIMES  DAILY   Lancets Misc. (ACCU-CHEK FASTCLIX LANCET) KIT To check blood sugar once daily   levothyroxine (SYNTHROID) 25 MCG tablet TAKE 1  TABLET BY MOUTH  DAILY BEFORE BREAKFAST   lisinopril (ZESTRIL) 20 MG tablet Take 1 tablet (20 mg total) by mouth daily.   metoprolol succinate (TOPROL-XL) 25 MG 24 hr tablet Take 1 tablet (25 mg total) by mouth daily.   OZEMPIC, 1 MG/DOSE, 4 MG/3ML SOPN INJECT 1 MG ONCE WEEK AS DIRECTED   QUEtiapine Fumarate (SEROQUEL XR) 150 MG 24 hr tablet Take 150 mg by mouth at bedtime.    spironolactone (ALDACTONE) 25 MG tablet TAKE 1 TABLET BY MOUTH DAILY   tiZANidine (ZANAFLEX) 2 MG tablet Take 1 tablet (2  mg total) by mouth 3 (three) times daily as needed for muscle spasms.   topiramate (TOPAMAX) 200 MG tablet Take 1 tablet (200 mg total) by mouth at bedtime.   traZODone (DESYREL) 150 MG tablet Take 1 tablet (150 mg total) by mouth at bedtime.   VRAYLAR 1.5 MG capsule Take 1.5 mg by mouth daily.   [DISCONTINUED] Flibanserin (ADDYI) 100 MG TABS Take 100 mg by mouth at bedtime.   No facility-administered medications prior to visit.    Review of Systems     Objective    BP 123/86 (BP Location: Left Arm, Patient Position: Sitting, Cuff Size: Large)   Pulse 83   Temp 98.6 F (37 C) (Oral)   Resp 17   Ht '5\' 7"'  (1.702 m)   Wt 288 lb 12.8 oz (131 kg)   SpO2 95%   BMI 45.23 kg/m    Physical Exam Vitals and nursing note reviewed.  Constitutional:      General: She is not in acute distress.    Appearance: Normal appearance. She is obese. She is not ill-appearing, toxic-appearing or diaphoretic.  HENT:     Head: Normocephalic and atraumatic.  Cardiovascular:     Rate and Rhythm: Normal rate and regular rhythm.     Pulses: Normal pulses.     Heart sounds: Normal heart sounds. No murmur heard.    No friction rub. No gallop.  Pulmonary:     Effort: Pulmonary effort is normal. No respiratory distress.     Breath sounds: Normal breath sounds. No stridor. No wheezing, rhonchi or rales.  Chest:     Chest wall: No tenderness.  Abdominal:     General: Bowel sounds are normal.     Palpations: Abdomen is soft.  Musculoskeletal:        General: No swelling, tenderness, deformity or signs of injury. Normal range of motion.     Right lower leg: No edema.     Left lower leg: No edema.  Skin:    General: Skin is warm and dry.     Capillary Refill: Capillary refill takes less than 2 seconds.     Coloration: Skin is not jaundiced or pale.     Findings: No bruising, erythema, lesion or rash.  Neurological:     General: No focal deficit present.     Mental Status: She is alert and oriented to  person, place, and time. Mental status is at baseline.     Cranial Nerves: No cranial nerve deficit.     Sensory: No sensory deficit.     Motor: No weakness.     Coordination: Coordination normal.  Psychiatric:        Mood and Affect: Mood normal.        Behavior: Behavior normal.        Thought Content: Thought content normal.        Judgment: Judgment normal.  No results found for any visits on 01/26/22.  Assessment & Plan     Problem List Items Addressed This Visit       Cardiovascular and Mediastinum   Hypertension associated with diabetes ([place])    Chronic, stable Goal <130/<80 Denies CP Denies SOB/ DOE Denies low blood pressure/hypotension Denies vision changes No LE Edema noted on exam Continue medication, Lisinopril 20 mg QD, Toprol XL 25 mg QD, Spiro 25 mg QD Denies side effects Seek emergent care if you develop chest pain or chest pressure       Relevant Orders   Comprehensive Metabolic Panel (CMET)   CBC with Differential/Platelet   Lipid panel   Hemoglobin A1c     Endocrine   Hyperlipidemia associated with type 2 diabetes mellitus (HCC)    Chronic, previously elevated at 104 recommend diet low in saturated fat and regular exercise - 30 min at least 5 times per week ASCVD risk stable; on lipitor at 20 mg The 10-year ASCVD risk score (Arnett DK, et al., 2019) is: 3.4%   Values used to calculate the score:     Age: 12 years     Sex: Female     Is Non-Hispanic African American: No     Diabetic: Yes     Tobacco smoker: No     Systolic Blood Pressure: 210 mmHg     Is BP treated: Yes     HDL Cholesterol: 50 mg/dL     Total Cholesterol: 175 mg/dL       Relevant Orders   Comprehensive Metabolic Panel (CMET)   CBC with Differential/Platelet   Lipid panel   Hemoglobin A1c   Type 2 diabetes mellitus with microalbuminuria, without long-term current use of insulin (HCC)    Chronic, previous stable Repeat A1c Continue to recommend balanced, lower  carb meals. Smaller meal size, adding snacks. Choosing water as drink of choice and increasing purposeful exercise. Continue Ozempic at 22m/week NFBG 90-100s; denies lows Declines DM foot exam      Relevant Orders   Hemoglobin A1c     Genitourinary   Stage 3a chronic kidney disease (HCC)    Chronic, stable Denies concerns Repeat CMP      Relevant Orders   Comprehensive Metabolic Panel (CMET)   CBC with Differential/Platelet     Other   Avitaminosis D    Chronic, previous unstable Repeat labs Currently on 1000 IU/QD      Relevant Orders   Vitamin D (25 hydroxy)   Morbid obesity ([facility] - Primary    Chronic, stable Associated with mood changes/depression Upcoming appt with [facility] on 7/11 Body mass index is 45.23 kg/m. Discussed importance of healthy weight management Discussed diet and exercise       Relevant Orders   Comprehensive Metabolic Panel (CMET)   CBC with Differential/Platelet   Lipid panel     Return in about 3 months (around 04/30/2022) for annual examination.      IVonna Kotyk FNP, have reviewed all documentation for this visit. The documentation on 01/26/22 for the exam, diagnosis, procedures, and orders are all accurate and complete.    EGwyneth Sprout [phone](phone) [phone](fax)  [place]

## 2022-01-26 ENCOUNTER — Encounter: Payer: Self-pay | Admitting: Family Medicine

## 2022-01-26 ENCOUNTER — Ambulatory Visit (INDEPENDENT_AMBULATORY_CARE_PROVIDER_SITE_OTHER): Payer: Medicare Other | Admitting: Family Medicine

## 2022-01-26 DIAGNOSIS — E785 Hyperlipidemia, unspecified: Secondary | ICD-10-CM

## 2022-01-26 DIAGNOSIS — E1159 Type 2 diabetes mellitus with other circulatory complications: Secondary | ICD-10-CM

## 2022-01-26 DIAGNOSIS — R809 Proteinuria, unspecified: Secondary | ICD-10-CM

## 2022-01-26 DIAGNOSIS — I152 Hypertension secondary to endocrine disorders: Secondary | ICD-10-CM

## 2022-01-26 DIAGNOSIS — N1831 Chronic kidney disease, stage 3a: Secondary | ICD-10-CM | POA: Diagnosis not present

## 2022-01-26 DIAGNOSIS — E1169 Type 2 diabetes mellitus with other specified complication: Secondary | ICD-10-CM

## 2022-01-26 DIAGNOSIS — E1129 Type 2 diabetes mellitus with other diabetic kidney complication: Secondary | ICD-10-CM

## 2022-01-26 DIAGNOSIS — E559 Vitamin D deficiency, unspecified: Secondary | ICD-10-CM

## 2022-01-26 NOTE — Assessment & Plan Note (Signed)
Chronic, stable Goal <130/<80 Denies CP Denies SOB/ DOE Denies low blood pressure/hypotension Denies vision changes No LE Edema noted on exam Continue medication, Lisinopril 20 mg QD, Toprol XL 25 mg QD, Spiro 25 mg QD Denies side effects Seek emergent care if you develop chest pain or chest pressure

## 2022-01-26 NOTE — Assessment & Plan Note (Signed)
Chronic, previously elevated at 104 recommend diet low in saturated fat and regular exercise - 30 min at least 5 times per week ASCVD risk stable; on lipitor at 20 mg The 10-year ASCVD risk score (Arnett DK, et al., 2019) is: 3.4%   Values used to calculate the score:     Age: 51 years     Sex: Female     Is Non-Hispanic African American: No     Diabetic: Yes     Tobacco smoker: No     Systolic Blood Pressure: 010 mmHg     Is BP treated: Yes     HDL Cholesterol: 50 mg/dL     Total Cholesterol: 175 mg/dL

## 2022-01-26 NOTE — Assessment & Plan Note (Signed)
Chronic, previous unstable Repeat labs Currently on 1000 IU/QD

## 2022-01-26 NOTE — Assessment & Plan Note (Signed)
Chronic, previous stable Repeat A1c Continue to recommend balanced, lower carb meals. Smaller meal size, adding snacks. Choosing water as drink of choice and increasing purposeful exercise. Continue Ozempic at '1mg'$ /week NFBG 90-100s; denies lows Declines DM foot exam

## 2022-01-26 NOTE — Assessment & Plan Note (Signed)
Chronic, stable Associated with mood changes/depression Upcoming appt with [facility] on 7/11 Body mass index is 45.23 kg/m. Discussed importance of healthy weight management Discussed diet and exercise

## 2022-01-26 NOTE — Assessment & Plan Note (Signed)
Chronic, stable Denies concerns Repeat CMP

## 2022-01-27 ENCOUNTER — Other Ambulatory Visit: Payer: Self-pay | Admitting: Family Medicine

## 2022-01-27 DIAGNOSIS — E034 Atrophy of thyroid (acquired): Secondary | ICD-10-CM

## 2022-01-27 LAB — CBC WITH DIFFERENTIAL/PLATELET
Basophils Absolute: 0.1 10*3/uL (ref 0.0–0.2)
Basos: 1 %
EOS (ABSOLUTE): 0.2 10*3/uL (ref 0.0–0.4)
Eos: 3 %
Hematocrit: 39.4 % (ref 34.0–46.6)
Hemoglobin: 13.1 g/dL (ref 11.1–15.9)
Immature Grans (Abs): 0 10*3/uL (ref 0.0–0.1)
Immature Granulocytes: 0 %
Lymphocytes Absolute: 2 10*3/uL (ref 0.7–3.1)
Lymphs: 35 %
MCH: 29.4 pg (ref 26.6–33.0)
MCHC: 33.2 g/dL (ref 31.5–35.7)
MCV: 88 fL (ref 79–97)
Monocytes Absolute: 0.5 10*3/uL (ref 0.1–0.9)
Monocytes: 8 %
Neutrophils Absolute: 3 10*3/uL (ref 1.4–7.0)
Neutrophils: 53 %
Platelets: 261 10*3/uL (ref 150–450)
RBC: 4.46 x10E6/uL (ref 3.77–5.28)
RDW: 13.2 % (ref 11.7–15.4)
WBC: 5.7 10*3/uL (ref 3.4–10.8)

## 2022-01-27 LAB — LIPID PANEL
Chol/HDL Ratio: 4.3 ratio (ref 0.0–4.4)
Cholesterol, Total: 213 mg/dL — ABNORMAL HIGH (ref 100–199)
HDL: 50 mg/dL (ref >39)
LDL Chol Calc (NIH): 143 mg/dL — ABNORMAL HIGH (ref 0–99)
Triglycerides: 114 mg/dL (ref 0–149)
VLDL Cholesterol Cal: 20 mg/dL (ref 5–40)

## 2022-01-27 LAB — COMPREHENSIVE METABOLIC PANEL
ALT: 23 IU/L (ref 0–32)
AST: 21 IU/L (ref 0–40)
Albumin/Globulin Ratio: 1.9 (ref 1.2–2.2)
Albumin: 3.7 g/dL — ABNORMAL LOW (ref 3.8–4.9)
Alkaline Phosphatase: 90 IU/L (ref 44–121)
BUN/Creatinine Ratio: 10 (ref 9–23)
BUN: 11 mg/dL (ref 6–24)
Bilirubin Total: <0.2 mg/dL (ref 0.0–1.2)
CO2: 20 mmol/L (ref 20–29)
Calcium: 8.7 mg/dL (ref 8.7–10.2)
Chloride: 111 mmol/L — ABNORMAL HIGH (ref 96–106)
Creatinine, Ser: 1.1 mg/dL — ABNORMAL HIGH (ref 0.57–1.00)
Globulin, Total: 1.9 g/dL (ref 1.5–4.5)
Glucose: 120 mg/dL — ABNORMAL HIGH (ref 70–99)
Potassium: 4.3 mmol/L (ref 3.5–5.2)
Sodium: 143 mmol/L (ref 134–144)
Total Protein: 5.6 g/dL — ABNORMAL LOW (ref 6.0–8.5)
eGFR: 60 mL/min/{1.73_m2} (ref >59)

## 2022-01-27 LAB — VITAMIN D 25 HYDROXY (VIT D DEFICIENCY, FRACTURES): Vit D, 25-Hydroxy: 25.5 ng/mL — ABNORMAL LOW (ref 30.0–100.0)

## 2022-01-27 LAB — HEMOGLOBIN A1C
Est. average glucose Bld gHb Est-mCnc: 140 mg/dL
Hgb A1c MFr Bld: 6.5 % — ABNORMAL HIGH (ref 4.8–5.6)

## 2022-01-31 ENCOUNTER — Other Ambulatory Visit: Payer: Self-pay | Admitting: Family Medicine

## 2022-01-31 DIAGNOSIS — E559 Vitamin D deficiency, unspecified: Secondary | ICD-10-CM

## 2022-01-31 MED ORDER — VITAMIN D (ERGOCALCIFEROL) 1.25 MG (50000 UNIT) PO CAPS: 50000.0000 [IU] | ORAL_CAPSULE | ORAL | 0 refills | Status: DC

## 2022-01-31 NOTE — Progress Notes (Signed)
Blood chemistry shows borderline elevation in creatinine, ensure adequate water intake of 64 oz/day minimum. Chemistry also shows low levels of protein. Recommend additional servings or once/daily supplement of protein shake into your diet.  Cholesterol is increased. The 10-year ASCVD risk score (Arnett DK, et al., 2019) is: 4.3%. Stable risk of heart attack/stroke at 5% in 10 years.   Values used to calculate the score:     Age: 51 years     Sex: Female     Is Non-Hispanic African American: No     Diabetic: Yes     Tobacco smoker: No     Systolic Blood Pressure: 768 mmHg     Is BP treated: Yes     HDL Cholesterol: 50 mg/dL     Total Cholesterol: 213 mg/dL I recommend diet low in saturated fat and regular exercise - 30 min at least 5 times per week  A1c remains stable at 6.5%. Continue to recommend balanced, lower carb meals. Smaller meal size, adding snacks. Choosing water as drink of choice and increasing purposeful exercise.  Vit D remains low; recommend high dose weekly supplement for 6 months with meal.  Normal cell count.  Gwyneth Sprout, [address] [place], [place] [address] [phone] (phone) [phone] (fax) [place]

## 2022-02-13 ENCOUNTER — Other Ambulatory Visit: Payer: Self-pay | Admitting: Family Medicine

## 2022-02-13 DIAGNOSIS — E782 Mixed hyperlipidemia: Secondary | ICD-10-CM

## 2022-02-15 ENCOUNTER — Other Ambulatory Visit: Payer: Self-pay | Admitting: Family Medicine

## 2022-02-27 HISTORY — PX: BARIATRIC SURGERY: SHX1103

## 2022-02-28 ENCOUNTER — Telehealth: Payer: Self-pay | Admitting: Family Medicine

## 2022-02-28 NOTE — Telephone Encounter (Signed)
[place] called from [employer] [facility] requesting that we change all of her active prescriptions to their pharmacy. Please advise, caller's line had poor connection and distorted audio.   Best contact: [phone]

## 2022-03-01 NOTE — Telephone Encounter (Signed)
Patient reports she will continue to use [facility] for now. No need to transfer medications.

## 2022-03-19 ENCOUNTER — Other Ambulatory Visit: Payer: Self-pay | Admitting: Family Medicine

## 2022-03-19 ENCOUNTER — Telehealth: Payer: Self-pay | Admitting: Family Medicine

## 2022-03-19 DIAGNOSIS — E1159 Type 2 diabetes mellitus with other circulatory complications: Secondary | ICD-10-CM

## 2022-03-19 MED ORDER — LISINOPRIL 5 MG PO TABS: 5.0000 mg | ORAL_TABLET | Freq: Every day | ORAL | 0 refills | Status: DC

## 2022-03-19 NOTE — Telephone Encounter (Signed)
Pt called and is confused about the cholesterol medication she should be taking.  Please advise  [phone]

## 2022-03-19 NOTE — Telephone Encounter (Signed)
Medication Refill - Medication: Lisinopril   Has the patient contacted their pharmacy? No. (Agent: If no, request that the patient contact the pharmacy for the refill. If patient does not wish to contact the pharmacy document the reason why and proceed with request.) (Agent: If yes, when and what did the pharmacy advise?)  Preferred Pharmacy (with phone number or street name): [facility] Has the patient been seen for an appointment in the last year OR does the patient have an upcoming appointment? Yes.    Agent: Please be advised that RX refills may take up to 3 business days. We ask that you follow-up with your pharmacy.

## 2022-03-20 NOTE — Telephone Encounter (Signed)
reordered yesterday 03/19/22 #90 and sent to mail order  Requested Prescriptions  Refused Prescriptions Disp Refills  . lisinopril (ZESTRIL) 5 MG tablet 90 tablet 0    Sig: Take 1 tablet (5 mg total) by mouth daily.     Cardiovascular:  ACE Inhibitors Failed - 03/19/2022  4:29 PM      Failed - Cr in normal range and within 180 days    Creatinine  Date Value Ref Range Status  05/12/2014 0.88 0.60 - 1.30 mg/dL Final   Creat  Date Value Ref Range Status  04/22/2014 0.79 0.50 - 1.10 mg/dL Final   Creatinine, Ser  Date Value Ref Range Status  01/26/2022 1.10 (H) 0.57 - 1.00 mg/dL Final         Passed - K in normal range and within 180 days    Potassium  Date Value Ref Range Status  01/26/2022 4.3 3.5 - 5.2 mmol/L Final  05/12/2014 3.0 (L) 3.5 - 5.1 mmol/L Final         Passed - Patient is not pregnant      Passed - Last BP in normal range    BP Readings from Last 1 Encounters:  01/26/22 123/86         Passed - Valid encounter within last 6 months    Recent Outpatient Visits          1 month ago Morbid obesity [facility])   [facility] [name] T, FNP   4 months ago Type 2 diabetes mellitus with microalbuminuria, without long-term current use of insulin [facility])   [facility] [name] T, FNP   6 months ago Anemia in chronic kidney disease, unspecified CKD stage   [facility] Myles Gip, DO   7 months ago Morbid obesity [facility])   [facility] [name] T, FNP   10 months ago Type 2 diabetes mellitus with microalbuminuria, without long-term current use of insulin [facility])   [facility] Gwyneth Sprout, [place]

## 2022-03-26 ENCOUNTER — Telehealth: Payer: Self-pay | Admitting: Family Medicine

## 2022-03-26 NOTE — Telephone Encounter (Signed)
Pt is calling regarding lisinopril (ZESTRIL) 5 MG tablet [[id number]] . Pt reports that she was taking '20mg'$ . Was the '5mg'$  sent in error. Please advise Cb- [phone]

## 2022-03-29 NOTE — Telephone Encounter (Signed)
Spoke with patient and advised. She is scheduled for a BP f/u 04/06/22

## 2022-04-06 ENCOUNTER — Ambulatory Visit: Payer: Medicare Other | Admitting: Family Medicine

## 2022-04-18 NOTE — Progress Notes (Signed)
Established patient visit   Patient: Barbara Anderson   DOB: 02-26-1971   51 y.o. Female  MRN: [id number] Visit Date: 04/20/2022  Today's healthcare provider: Gwyneth Sprout, FNP  Re Introduced to nurse practitioner role and practice setting.  All questions answered.  Discussed provider/patient relationship and expectations.  I,Tiffany J Bragg,acting as a scribe for Gwyneth Sprout, FNP.,have documented all relevant documentation on the behalf of Gwyneth Sprout, FNP,as directed by  Gwyneth Sprout, FNP while in the presence of Gwyneth Sprout, FNP.   Chief Complaint  Patient presents with   Hypertension   Subjective    HPI  Hypertension, follow-up  BP Readings from Last 3 Encounters:  04/20/22 108/79  01/26/22 123/86  11/28/21 120/80   Wt Readings from Last 3 Encounters:  04/20/22 261 lb (118.4 kg)  01/26/22 288 lb 12.8 oz (131 kg)  11/28/21 282 lb (127.9 kg)     She was last seen for hypertension 3 months ago.  BP at that visit was 123/86. Management since that visit includes continue medication.  She reports excellent compliance with treatment. She is not having side effects.  She is following a  high protien  diet. She is exercising. She does not smoke.  Use of agents associated with hypertension: none.   Outside blood pressures are not checked. Symptoms: No chest pain No chest pressure  No palpitations No syncope  No dyspnea No orthopnea  No paroxysmal nocturnal dyspnea Yes lower extremity edema   Pertinent labs Lab Results  Component Value Date   CHOL 213 (H) 01/26/2022   HDL 50 01/26/2022   LDLCALC 143 (H) 01/26/2022   TRIG 114 01/26/2022   CHOLHDL 4.3 01/26/2022   Lab Results  Component Value Date   NA 143 01/26/2022   K 4.3 01/26/2022   CREATININE 1.10 (H) 01/26/2022   EGFR 60 01/26/2022   GLUCOSE 120 (H) 01/26/2022   TSH 1.410 11/28/2021     The 10-year ASCVD risk score (Arnett DK, et al., 2019) is:  3.3%  ---------------------------------------------------------------------------------------------------   Medications: Outpatient Medications Prior to Visit  Medication Sig   Adapalene (DIFFERIN) 0.3 % gel Apply 1 application topically at bedtime.   Alcohol Swabs (B-D SINGLE USE SWABS REGULAR) PADS USE TO CLEANSE SKIN EVERY DAY BEFORE CHECKING BLOOD SUGAR   ALPRAZolam (XANAX) 1 MG tablet Take 1 mg by mouth 2 (two) times daily as needed.    amphetamine-dextroamphetamine (ADDERALL) 20 MG tablet Take 1 tablet (20 mg total) by mouth in the morning, at noon, and at bedtime. Has not started yet 11/10/19   ARIPiprazole (ABILIFY) 5 MG tablet Take 5 mg by mouth daily.   atorvastatin (LIPITOR) 20 MG tablet TAKE 1 TABLET BY MOUTH DAILY   Cholecalciferol (VITAMIN D3) 25 MCG (1000 UT) CAPS Take 1 capsule (1,000 Units total) by mouth daily.   DULoxetine (CYMBALTA) 60 MG capsule Take 2 capsules (120 mg total) by mouth daily.   DULoxetine (CYMBALTA) 60 MG capsule Take by mouth.   ferrous sulfate 325 (65 FE) MG tablet Take by mouth.   fluticasone (FLONASE) 50 MCG/ACT nasal spray USE 2 SPRAYS IN BOTH NOSTRILS  DAILY   folic acid (FOLVITE) 409 MCG tablet Take 1 tablet (800 mcg total) by mouth daily.   gabapentin (NEURONTIN) 300 MG capsule Take by mouth.   Lancets Misc. (ACCU-CHEK FASTCLIX LANCET) KIT To check blood sugar once daily   levofloxacin (LEVAQUIN) 500 MG tablet SMARTSIG:1 Tablet(s) By Mouth   levothyroxine (  SYNTHROID) 25 MCG tablet TAKE 1 TABLET BY MOUTH  DAILY BEFORE BREAKFAST   metoCLOPramide (REGLAN) 10 MG tablet Take by mouth.   OZEMPIC, 1 MG/DOSE, 4 MG/3ML SOPN INJECT 1MG UNDER THE SKIN ONCE WEEKLY AS DIRECTED   QUEtiapine Fumarate (SEROQUEL XR) 150 MG 24 hr tablet Take 150 mg by mouth at bedtime.    ramelteon (ROZEREM) 8 MG tablet Take 8 mg by mouth at bedtime.   spironolactone (ALDACTONE) 25 MG tablet TAKE 1 TABLET BY MOUTH DAILY   tiZANidine (ZANAFLEX) 2 MG tablet Take 1 tablet (2 mg  total) by mouth 3 (three) times daily as needed for muscle spasms.   topiramate (TOPAMAX) 200 MG tablet Take 1 tablet (200 mg total) by mouth at bedtime.   traZODone (DESYREL) 150 MG tablet Take 1 tablet (150 mg total) by mouth at bedtime.   Vitamin D, Ergocalciferol, (DRISDOL) 1.25 MG (50000 UNIT) CAPS capsule Take 1 capsule (50,000 Units total) by mouth every 7 (seven) days. Take with meal.   VRAYLAR 1.5 MG capsule Take 1.5 mg by mouth daily.   [DISCONTINUED] lisinopril (ZESTRIL) 5 MG tablet Take 1 tablet (5 mg total) by mouth daily.   [DISCONTINUED] metoprolol succinate (TOPROL-XL) 25 MG 24 hr tablet Take 1 tablet (25 mg total) by mouth daily.   [DISCONTINUED] gabapentin (NEURONTIN) 300 MG capsule TAKE 1 CAPSULE BY MOUTH 3 TIMES  DAILY   No facility-administered medications prior to visit.    Review of Systems    Objective    BP 108/79 (BP Location: Right Arm, Patient Position: Sitting, Cuff Size: Large)   Pulse 70   Resp 16   Ht _0  (1.702 m)   Wt 261 lb (118.4 kg)   SpO2 99%   BMI 40.88 kg/m   Physical Exam    No results found for any visits on 04/20/22.  Assessment & Plan     Problem List Items Addressed This Visit       Cardiovascular and Mediastinum   Hypertension associated with diabetes ([place]) - Primary    Chronic, stable Has done well with switch from 20 to 5 of lisinopril with continued use of metop as well Discussed kidney benefit as primary benefit from continued use Normal exam; denies complaints/side effects No LE edema noted      Relevant Medications   lisinopril (ZESTRIL) 5 MG tablet   metoprolol succinate (TOPROL-XL) 25 MG 24 hr tablet     Other   Need for influenza vaccination    Consented; VIS made available; no immediate side effects following administration; plan to repeat annually        Relevant Orders   Flu Vaccine QUAD 6+ mos PF IM (Fluarix Quad PF) (Completed)     Return in about 6 months (around 10/19/2022) for annual  examination.      Vonna Kotyk, FNP, have reviewed all documentation for this visit. The documentation on 04/20/22 for the exam, diagnosis, procedures, and orders are all accurate and complete.    Gwyneth Sprout, [place] [phone] (phone) [phone] (fax)  [place]

## 2022-04-20 ENCOUNTER — Encounter: Payer: Self-pay | Admitting: Family Medicine

## 2022-04-20 ENCOUNTER — Ambulatory Visit (INDEPENDENT_AMBULATORY_CARE_PROVIDER_SITE_OTHER): Payer: Medicare Other | Admitting: Family Medicine

## 2022-04-20 VITALS — BP 108/79 | HR 70 | Resp 16 | Ht 67.0 in | Wt 261.0 lb

## 2022-04-20 DIAGNOSIS — Z23 Encounter for immunization: Secondary | ICD-10-CM

## 2022-04-20 DIAGNOSIS — E1159 Type 2 diabetes mellitus with other circulatory complications: Secondary | ICD-10-CM

## 2022-04-20 DIAGNOSIS — I152 Hypertension secondary to endocrine disorders: Secondary | ICD-10-CM | POA: Diagnosis not present

## 2022-04-20 MED ORDER — METOPROLOL SUCCINATE ER 25 MG PO TB24: 25.0000 mg | ORAL_TABLET | Freq: Every day | ORAL | 3 refills | Status: DC

## 2022-04-20 MED ORDER — LISINOPRIL 5 MG PO TABS: 5.0000 mg | ORAL_TABLET | Freq: Every day | ORAL | 3 refills | Status: DC

## 2022-04-20 NOTE — Assessment & Plan Note (Signed)
Chronic, stable Has done well with switch from 20 to 5 of lisinopril with continued use of metop as well Discussed kidney benefit as primary benefit from continued use Normal exam; denies complaints/side effects No LE edema noted

## 2022-04-20 NOTE — Assessment & Plan Note (Signed)
Consented; VIS made available; no immediate side effects following administration; plan to repeat annually   

## 2022-04-24 ENCOUNTER — Other Ambulatory Visit: Payer: Self-pay | Admitting: Family Medicine

## 2022-04-24 MED ORDER — OZEMPIC (1 MG/DOSE) 4 MG/3ML ~~LOC~~ SOPN: PEN_INJECTOR | SUBCUTANEOUS | 1 refills | Status: DC

## 2022-04-24 NOTE — Telephone Encounter (Signed)
Requested medication (s) are due for refill today: yes  Requested medication (s) are on the active medication list: yes  Last refill:  03/16/22  Future visit scheduled: yes  Notes to clinic:  historical med. Please review for refill     Requested Prescriptions  Pending Prescriptions Disp Refills   gabapentin (NEURONTIN) 300 MG capsule      Sig: Take by mouth.     Neurology: Anticonvulsants - gabapentin Failed - 04/24/2022  4:54 PM      Failed - Cr in normal range and within 360 days    Creatinine  Date Value Ref Range Status  05/12/2014 0.88 0.60 - 1.30 mg/dL Final   Creat  Date Value Ref Range Status  04/22/2014 0.79 0.50 - 1.10 mg/dL Final   Creatinine, Ser  Date Value Ref Range Status  01/26/2022 1.10 (H) 0.57 - 1.00 mg/dL Final         Passed - Completed PHQ-2 or PHQ-9 in the last 360 days      Passed - Valid encounter within last 12 months    Recent Outpatient Visits           4 days ago Hypertension associated with diabetes [facility])   [facility] Gwyneth Sprout, FNP   2 months ago Morbid obesity [facility])   [facility] [name] T, FNP   6 months ago Type 2 diabetes mellitus with microalbuminuria, without long-term current use of insulin [facility])   [facility] [name] T, FNP   7 months ago Anemia in chronic kidney disease, unspecified CKD stage   [facility] Myles Gip, DO   9 months ago Morbid obesity [facility])   [facility] [name] T, FNP       Future Appointments             In 5 months Gwyneth Sprout, FNP [facility], [facility]            Signed Prescriptions Disp Refills   Semaglutide, 1 MG/DOSE, (OZEMPIC, 1 MG/DOSE,) 4 MG/3ML SOPN 9 mL 1    Sig: INJECT '1MG'$  UNDER THE SKIN ONCE WEEKLY AS DIRECTED     Endocrinology:  Diabetes - GLP-1 Receptor Agonists - semaglutide Failed - 04/24/2022  4:54 PM      Failed - HBA1C in normal range and within 180 days     Hgb A1c MFr Bld  Date Value Ref Range Status  01/26/2022 6.5 (H) 4.8 - 5.6 % Final    Comment:             Prediabetes: 5.7 - 6.4          Diabetes: >6.4          Glycemic control for adults with diabetes: <7.0          Failed - Cr in normal range and within 360 days    Creatinine  Date Value Ref Range Status  05/12/2014 0.88 0.60 - 1.30 mg/dL Final   Creat  Date Value Ref Range Status  04/22/2014 0.79 0.50 - 1.10 mg/dL Final   Creatinine, Ser  Date Value Ref Range Status  01/26/2022 1.10 (H) 0.57 - 1.00 mg/dL Final         Passed - Valid encounter within last 6 months    Recent Outpatient Visits           4 days ago Hypertension associated with diabetes [facility])   [facility] Gwyneth Sprout, FNP  2 months ago Morbid obesity [facility])   [facility] [name] T, FNP   6 months ago Type 2 diabetes mellitus with microalbuminuria, without long-term current use of insulin [facility])   [facility] [name] T, FNP   7 months ago Anemia in chronic kidney disease, unspecified CKD stage   [facility] Myles Gip, DO   9 months ago Morbid obesity [facility])   [facility] Gwyneth Sprout, FNP       Future Appointments             In 5 months Gwyneth Sprout, [place], [facility]

## 2022-04-24 NOTE — Telephone Encounter (Signed)
Medication Refill - Medication: gabapentin (NEURONTIN) 300 MG capsule  and  OZEMPIC, 1 MG/DOSE, 4 MG/3ML SOPN  Has the patient contacted their pharmacy? Yes.   (Agent: If no, request that the patient contact the pharmacy for the refill. If patient does not wish to contact the pharmacy document the reason why and proceed with request.) (Agent: If yes, when and what did the pharmacy advise?)  Preferred Pharmacy (with phone number or street name):  [facility] [place], [address] Phone:  [phone]  Fax:  [phone]     Has the patient been seen for an appointment in the last year OR does the patient have an upcoming appointment? Yes.    Agent: Please be advised that RX refills may take up to 3 business days. We ask that you follow-up with your pharmacy.

## 2022-04-24 NOTE — Telephone Encounter (Signed)
Requested Prescriptions  Pending Prescriptions Disp Refills  . gabapentin (NEURONTIN) 300 MG capsule      Sig: Take by mouth.     Neurology: Anticonvulsants - gabapentin Failed - 04/24/2022  4:54 PM      Failed - Cr in normal range and within 360 days    Creatinine  Date Value Ref Range Status  05/12/2014 0.88 0.60 - 1.30 mg/dL Final   Creat  Date Value Ref Range Status  04/22/2014 0.79 0.50 - 1.10 mg/dL Final   Creatinine, Ser  Date Value Ref Range Status  01/26/2022 1.10 (H) 0.57 - 1.00 mg/dL Final         Passed - Completed PHQ-2 or PHQ-9 in the last 360 days      Passed - Valid encounter within last 12 months    Recent Outpatient Visits          4 days ago Hypertension associated with diabetes [facility])   [facility] Gwyn[facility] Sprout, FNP   2 months ago Morbid obesity [facility])   [facility] [name] T, FNP   6 months ago Type 2 diabetes mellitus with microalbuminuria, without long-term current use of insulin [facility])   [facility] [name] T, FNP   7 months ago Anemia in chronic kidney disease, unspecified CKD stage   [facility] Myles Gip, DO   9 months ago Morbid obesity [facility])   [facility] Gwyneth Sprout, FNP      Future Appointments            In 5 months Gwyneth Sprout, [place], [place]           . Semaglutide, 1 MG/DOSE, (OZEMPIC, 1 MG/DOSE,) 4 MG/3ML SOPN 9 mL 0     Endocrinology:  Diabetes - GLP-1 Receptor Agonists - semaglutide Failed - 04/24/2022  4:54 PM      Failed - HBA1C in normal range and within 180 days    Hgb A1c MFr Bld  Date Value Ref Range Status  01/26/2022 6.5 (H) 4.8 - 5.6 % Final    Comment:             Prediabetes: 5.7 - 6.4          Diabetes: >6.4          Glycemic control for adults with diabetes: <7.0          Failed - Cr in normal range and within 360 days    Creatinine  Date Value Ref Range Status  05/12/2014 0.88 0.60 -  1.30 mg/dL Final   Creat  Date Value Ref Range Status  04/22/2014 0.79 0.50 - 1.10 mg/dL Final   Creatinine, Ser  Date Value Ref Range Status  01/26/2022 1.10 (H) 0.57 - 1.00 mg/dL Final         Passed - Valid encounter within last 6 months    Recent Outpatient Visits          4 days ago Hypertension associated with diabetes [facility])   [facility] [name] T, FNP   2 months ago Morbid obesity [facility])   [facility] [name] T, FNP   6 months ago Type 2 diabetes mellitus with microalbuminuria, without long-term current use of insulin [facility])   [facility] [name] T, FNP   7 months ago Anemia in chronic kidney disease, unspecified CKD stage   [facility] Myles Gip, DO   9 months  ago Morbid obesity [facility])   [facility] Gwyneth Sprout, FNP      Future Appointments            In 5 months Rollene Rotunda Jaci Standard, [place], [facility]

## 2022-04-25 MED ORDER — GABAPENTIN 300 MG PO CAPS: 300.0000 mg | ORAL_CAPSULE | Freq: Every day | ORAL | 1 refills | Status: DC

## 2022-04-30 ENCOUNTER — Ambulatory Visit: Payer: Medicare Other | Admitting: Family Medicine

## 2022-05-01 LAB — HM DIABETES EYE EXAM

## 2022-05-18 ENCOUNTER — Telehealth: Payer: Self-pay | Admitting: Family Medicine

## 2022-05-18 DIAGNOSIS — E034 Atrophy of thyroid (acquired): Secondary | ICD-10-CM

## 2022-05-18 DIAGNOSIS — J0140 Acute pansinusitis, unspecified: Secondary | ICD-10-CM

## 2022-05-18 MED ORDER — FLUTICASONE PROPIONATE 50 MCG/ACT NA SUSP: NASAL | 1 refills | Status: DC

## 2022-05-18 MED ORDER — LEVOTHYROXINE SODIUM 25 MCG PO TABS: 25.0000 ug | ORAL_TABLET | Freq: Every day | ORAL | 2 refills | Status: DC

## 2022-05-18 NOTE — Addendum Note (Signed)
Addended by: Smitty Knudsen on: 05/18/2022 10:22 AM   Modules accepted: Orders

## 2022-05-18 NOTE — Telephone Encounter (Signed)
[facility] faxed refill request for the following medications:  fluticasone (FLONASE) 50 MCG/ACT nasal spray  levothyroxine (SYNTHROID) 25 MCG tablet    Please advise.

## 2022-05-30 ENCOUNTER — Telehealth: Payer: Self-pay | Admitting: Family Medicine

## 2022-05-30 DIAGNOSIS — E782 Mixed hyperlipidemia: Secondary | ICD-10-CM

## 2022-05-30 DIAGNOSIS — L700 Acne vulgaris: Secondary | ICD-10-CM

## 2022-05-30 MED ORDER — SPIRONOLACTONE 25 MG PO TABS: 25.0000 mg | ORAL_TABLET | Freq: Every day | ORAL | 3 refills | Status: DC

## 2022-05-30 MED ORDER — ATORVASTATIN CALCIUM 20 MG PO TABS: 20.0000 mg | ORAL_TABLET | Freq: Every day | ORAL | 3 refills | Status: DC

## 2022-05-30 NOTE — Telephone Encounter (Signed)
[facility] faxed refill request for the following medications:  atorvastatin (LIPITOR) 20 MG tablet  spironolactone (ALDACTONE) 25 MG tablet  ramelteon (ROZEREM) 8 MG tablet  Please advise.

## 2022-06-01 ENCOUNTER — Telehealth: Payer: Self-pay | Admitting: Family Medicine

## 2022-06-01 ENCOUNTER — Other Ambulatory Visit: Payer: Self-pay | Admitting: Family Medicine

## 2022-06-01 MED ORDER — RAMELTEON 8 MG PO TABS: 8.0000 mg | ORAL_TABLET | Freq: Every day | ORAL | 3 refills | Status: DC

## 2022-06-01 NOTE — Telephone Encounter (Signed)
[place] pharmacy faxed refill request for the following medications:   ramelteon (ROZEREM) 8 MG tablet   Please advise

## 2022-06-06 ENCOUNTER — Other Ambulatory Visit: Payer: Self-pay | Admitting: Family Medicine

## 2022-06-06 DIAGNOSIS — E1159 Type 2 diabetes mellitus with other circulatory complications: Secondary | ICD-10-CM

## 2022-06-06 DIAGNOSIS — L700 Acne vulgaris: Secondary | ICD-10-CM

## 2022-06-06 DIAGNOSIS — E034 Atrophy of thyroid (acquired): Secondary | ICD-10-CM

## 2022-06-06 DIAGNOSIS — J0140 Acute pansinusitis, unspecified: Secondary | ICD-10-CM

## 2022-06-06 DIAGNOSIS — E782 Mixed hyperlipidemia: Secondary | ICD-10-CM

## 2022-06-06 NOTE — Telephone Encounter (Signed)
Medication Refill - Medication:   metoprolol succinate (TOPROL-XL) 25 MG 24 hr table  atorvastatin (LIPITOR) 20 MG tablet  levothyroxine (SYNTHROID) 25 MCG tablet  fluticasone (FLONASE) 50 MCG/ACT nasal spray  ramelteon (ROZEREM) 8 MG tablet  spironolactone (ALDACTONE) 25 MG tablet   Has the patient contacted their pharmacy? Yes.   (Agent: If no, request that the patient contact the pharmacy for the refill. If patient does not wish to contact the pharmacy document the reason why and proceed with request.) (Agent: If yes, when and what did the pharmacy advise?)  Preferred Pharmacy (with phone number or street name):  [facility] [place] [place] [address] [place] [address]  Phone: [phone] Fax: [phone]   Has the patient been seen for an appointment in the last year OR does the patient have an upcoming appointment? Yes.    Agent: Please be advised that RX refills may take up to 3 business days. We ask that you follow-up with your pharmacy.

## 2022-06-06 NOTE — Telephone Encounter (Signed)
Unable to refill per protocol, Rx request is too soon last refill 05/30/22 for 90 and 3 RF. Will refuse duplicate request. Requested Prescriptions  Pending Prescriptions Disp Refills   metoprolol succinate (TOPROL-XL) 25 MG 24 hr tablet 90 tablet 3    Sig: Take 1 tablet (25 mg total) by mouth daily.     Cardiovascular:  Beta Blockers Passed - 06/06/2022 11:30 AM      Passed - Last BP in normal range    BP Readings from Last 1 Encounters:  04/20/22 108/79         Passed - Last Heart Rate in normal range    Pulse Readings from Last 1 Encounters:  04/20/22 70         Passed - Valid encounter within last 6 months    Recent Outpatient Visits           1 month ago Hypertension associated with diabetes [facility])   [facility] [name] T, FNP   4 months ago Morbid obesity [facility])   [facility] [name] T, FNP   7 months ago Type 2 diabetes mellitus with microalbuminuria, without long-term current use of insulin [facility])   [facility] [name] T, FNP   8 months ago Anemia in chronic kidney disease, unspecified CKD stage   [facility] [name] M, DO   10 months ago Morbid obesity [facility])   [facility] [name] T, FNP       Future Appointments             In 4 months Gwyneth Sprout, [place], [facility]             atorvastatin (LIPITOR) 20 MG tablet 80 tablet 3    Sig: Take 1 tablet (20 mg total) by mouth daily.     Cardiovascular:  Antilipid - Statins Failed - 06/06/2022 11:30 AM      Failed - Lipid Panel in normal range within the last 12 months    Cholesterol, Total  Date Value Ref Range Status  01/26/2022 213 (H) 100 - 199 mg/dL Final   LDL Chol Calc (NIH)  Date Value Ref Range Status  01/26/2022 143 (H) 0 - 99 mg/dL Final   HDL  Date Value Ref Range Status  01/26/2022 50 >39 mg/dL Final   Triglycerides  Date Value Ref Range Status  01/26/2022 114 0 - 149 mg/dL  Final         Passed - Patient is not pregnant      Passed - Valid encounter within last 12 months    Recent Outpatient Visits           1 month ago Hypertension associated with diabetes [facility])   [facility] [name] T, FNP   4 months ago Morbid obesity [facility])   [facility] [name] T, FNP   7 months ago Type 2 diabetes mellitus with microalbuminuria, without long-term current use of insulin [facility])   [facility] [name] T, FNP   8 months ago Anemia in chronic kidney disease, unspecified CKD stage   [facility] [name] M, DO   10 months ago Morbid obesity [facility])   [facility] Gwyneth Sprout, FNP       Future Appointments             In 4 months Gwyneth Sprout, [place], [facility]  levothyroxine (SYNTHROID) 25 MCG tablet 100 tablet 2    Sig: Take 1 tablet (25 mcg total) by mouth daily before breakfast.     Endocrinology:  Hypothyroid Agents Passed - 06/06/2022 11:30 AM      Passed - TSH in normal range and within 360 days    TSH  Date Value Ref Range Status  11/28/2021 1.410 0.450 - 4.500 uIU/mL Final         Passed - Valid encounter within last 12 months    Recent Outpatient Visits           1 month ago Hypertension associated with diabetes [facility])   [facility] [name] T, FNP   4 months ago Morbid obesity [facility])   [facility] [name] T, FNP   7 months ago Type 2 diabetes mellitus with microalbuminuria, without long-term current use of insulin [facility])   [facility] [name] T, FNP   8 months ago Anemia in chronic kidney disease, unspecified CKD stage   [facility] [name] M, DO   10 months ago Morbid obesity [facility])   [facility] [name] T, FNP       Future Appointments             In 4 months Gwyneth Sprout, FNP [facility], [facility]              fluticasone (FLONASE) 50 MCG/ACT nasal spray 64 g 1    Sig: USE 2 SPRAYS IN BOTH NOSTRILS  DAILY     Ear, Nose, and Throat: Nasal Preparations - Corticosteroids Passed - 06/06/2022 11:30 AM      Passed - Valid encounter within last 12 months    Recent Outpatient Visits           1 month ago Hypertension associated with diabetes [facility])   [facility] [name] T, FNP   4 months ago Morbid [facility])   [facility] [name] T, FNP   7 months ago Type 2 diabetes mellitus with microalbuminuria, without long-term current use of insulin [facility])   [facility] [name] T, FNP   8 months ago Anemia in chronic kidney disease, unspecified CKD stage   [facility] [name] M, DO   10 months ago Morbid obesity [facility])   [facility] [name] T, FNP       Future Appointments             In 4 months Gwyneth Sprout, [place], [facility]             ramelteon (ROZEREM) 8 MG tablet 90 tablet 3    Sig: Take 1 tablet (8 mg total) by mouth at bedtime.     Psychiatry: Anxiolytics/Hypnotics - Non-controlled Passed - 06/06/2022 11:30 AM      Passed - Valid encounter within last 12 months    Recent Outpatient Visits           1 month ago Hypertension associated with diabetes [facility])   [facility] [name] T, FNP   4 months ago Morbid obesity [facility])   [facility] [name] T, FNP   7 months ago Type 2 diabetes mellitus with microalbuminuria, without long-term current use of insulin [facility])   [facility] [name] T, FNP   8 months ago Anemia in chronic kidney disease, unspecified CKD stage   [facility] [name]  M, DO   10 months ago Morbid obesity [facility])   [facility] [name] T, FNP       Future Appointments             In 4 months Gwyneth Sprout, FNP [facility], [facility]             spironolactone (ALDACTONE) 25 MG tablet 80 tablet 3    Sig: Take 1 tablet (25 mg total) by mouth daily.     Cardiovascular: Diuretics - Aldosterone Antagonist Failed - 06/06/2022 11:30 AM      Failed - Cr in normal range and within 180 days    Creatinine  Date Value Ref Range Status  05/12/2014 0.88 0.60 - 1.30 mg/dL Final   Creat  Date Value Ref Range Status  04/22/2014 0.79 0.50 - 1.10 mg/dL Final   Creatinine, Ser  Date Value Ref Range Status  01/26/2022 1.10 (H) 0.57 - 1.00 mg/dL Final         Passed - K in normal range and within 180 days    Potassium  Date Value Ref Range Status  01/26/2022 4.3 3.5 - 5.2 mmol/L Final  05/12/2014 3.0 (L) 3.5 - 5.1 mmol/L Final         Passed - Na in normal range and within 180 days    Sodium  Date Value Ref Range Status  01/26/2022 143 134 - 144 mmol/L Final  05/12/2014 144 136 - 145 mmol/L Final         Passed - eGFR is 30 or above and within 180 days    EGFR (African American)  Date Value Ref Range Status  10/21/2013 >60  Final   GFR calc Af Amer  Date Value Ref Range Status  02/04/2020 76 >59 mL/min/1.73 Final    Comment:    **[facility] currently reports eGFR in compliance with the current**   recommendations of the [employer]. [facility] will   update reporting as new guidelines are published from the NKF-ASN   Task force.    EGFR (Non-African Amer.)  Date Value Ref Range Status  10/21/2013 >60  Final    Comment:    eGFR values <81m/min/1.73 m2 may be an indication of chronic kidney disease (CKD). Calculated eGFR is useful in patients with stable renal function. The eGFR calculation will not be reliable in acutely ill patients when serum creatinine is changing rapidly. It is not useful in  patients on dialysis. The eGFR calculation may not be applicable to patients at the low and high extremes of body sizes, pregnant women, and vegetarians.    GFR calc non Af Amer   Date Value Ref Range Status  02/04/2020 66 >59 mL/min/1.73 Final   eGFR  Date Value Ref Range Status  01/26/2022 60 >59 mL/min/1.73 Final         Passed - Last BP in normal range    BP Readings from Last 1 Encounters:  04/20/22 108/79         Passed - Valid encounter within last 6 months    Recent Outpatient Visits           1 month ago Hypertension associated with diabetes ([facility]   4 months ago Morbid obesity ([facility]   7 months ago Type 2 diabetes mellitus with microalbuminuria, without long-term current use of insulin ([facility]   [place]  Jaci Standard, FNP   8 months ago Anemia in chronic kidney disease, unspecified CKD stage   [facility] Myles Gip, DO   10 months ago Morbid obesity [facility])   [facility] Gwyneth Sprout, FNP       Future Appointments             In 4 months Rollene Rotunda, Jaci Standard, [place], [facility]

## 2022-06-19 ENCOUNTER — Other Ambulatory Visit: Payer: Self-pay

## 2022-06-19 ENCOUNTER — Telehealth: Payer: Self-pay | Admitting: Family Medicine

## 2022-06-19 DIAGNOSIS — J0140 Acute pansinusitis, unspecified: Secondary | ICD-10-CM

## 2022-06-19 MED ORDER — FLUTICASONE PROPIONATE 50 MCG/ACT NA SUSP: NASAL | 1 refills | Status: DC

## 2022-06-19 NOTE — Telephone Encounter (Signed)
[place] pharmacy faxed refill request for the following medications:   fluticasone (FLONASE) 50 MCG/ACT nasal spray     Please advise

## 2022-08-07 ENCOUNTER — Ambulatory Visit (INDEPENDENT_AMBULATORY_CARE_PROVIDER_SITE_OTHER): Payer: Medicare Other

## 2022-08-07 VITALS — Ht 67.0 in | Wt 261.0 lb

## 2022-08-07 DIAGNOSIS — Z1231 Encounter for screening mammogram for malignant neoplasm of breast: Secondary | ICD-10-CM

## 2022-08-07 DIAGNOSIS — Z0000 Encounter for general adult medical examination without abnormal findings: Secondary | ICD-10-CM

## 2022-08-07 NOTE — Patient Instructions (Signed)
Barbara Anderson , Thank you for taking time to come for your Medicare Wellness Visit. I appreciate your ongoing commitment to your health goals. Please review the following plan we discussed and let me know if I can assist you in the future.   Screening recommendations/referrals: Colonoscopy: 12/11/19, declined referral Mammogram:referral sent Recommended yearly ophthalmology/optometry visit for glaucoma screening and checkup Recommended yearly dental visit for hygiene and checkup  Vaccinations: Influenza vaccine: 04/20/22 Pneumococcal vaccine: 07/26/21 Tdap vaccine: n/d Shingles vaccine: Shingrix 07/26/21, 10/26/21  Covid-19: n/d  Advanced directives: no  Conditions/risks identified: none  Next appointment: Follow up in one year for your annual wellness visit. 08/12/23 @ 10:15 am by phone  Preventive Care 40-64 Years, Female Preventive care refers to lifestyle choices and visits with your health care provider that can promote health and wellness. What does preventive care include? A yearly physical exam. This is also called an annual well check. Dental exams once or twice a year. Routine eye exams. Ask your health care provider how often you should have your eyes checked. Personal lifestyle choices, including: Daily care of your teeth and gums. Regular physical activity. Eating a healthy diet. Avoiding tobacco and drug use. Limiting alcohol use. Practicing safe sex. Taking low-dose aspirin daily starting at age 17. Taking vitamin and mineral supplements as recommended by your health care provider. What happens during an annual well check? The services and screenings done by your health care provider during your annual well check will depend on your age, overall health, lifestyle risk factors, and family history of disease. Counseling  Your health care provider may ask you questions about your: Alcohol use. Tobacco use. Drug use. Emotional well-being. Home and relationship  well-being. Sexual activity. Eating habits. Work and work Statistician. Method of birth control. Menstrual cycle. Pregnancy history. Screening  You may have the following tests or measurements: Height, weight, and BMI. Blood pressure. Lipid and cholesterol levels. These may be checked every 5 years, or more frequently if you are over 35 years old. Skin check. Lung cancer screening. You may have this screening every year starting at age 50 if you have a 30-pack-year history of smoking and currently smoke or have quit within the past 15 years. Fecal occult blood test (FOBT) of the stool. You may have this test every year starting at age 12. Flexible sigmoidoscopy or colonoscopy. You may have a sigmoidoscopy every 5 years or a colonoscopy every 10 years starting at age 51. Hepatitis C blood test. Hepatitis B blood test. Sexually transmitted disease (STD) testing. Diabetes screening. This is done by checking your blood sugar (glucose) after you have not eaten for a while (fasting). You may have this done every 1-3 years. Mammogram. This may be done every 1-2 years. Talk to your health care provider about when you should start having regular mammograms. This may depend on whether you have a family history of breast cancer. BRCA-related cancer screening. This may be done if you have a family history of breast, ovarian, tubal, or peritoneal cancers. Pelvic exam and Pap test. This may be done every 3 years starting at age 33. Starting at age 58, this may be done every 5 years if you have a Pap test in combination with an HPV test. Bone density scan. This is done to screen for osteoporosis. You may have this scan if you are at high risk for osteoporosis. Discuss your test results, treatment options, and if necessary, the need for more tests with your health care provider. Vaccines  Your health care provider may recommend certain vaccines, such as: Influenza vaccine. This is recommended every  year. Tetanus, diphtheria, and acellular pertussis (Tdap, Td) vaccine. You may need a Td booster every 10 years. Zoster vaccine. You may need this after age 78. Pneumococcal 13-valent conjugate (PCV13) vaccine. You may need this if you have certain conditions and were not previously vaccinated. Pneumococcal polysaccharide (PPSV23) vaccine. You may need one or two doses if you smoke cigarettes or if you have certain conditions. Talk to your health care provider about which screenings and vaccines you need and how often you need them. This information is not intended to replace advice given to you by your health care provider. Make sure you discuss any questions you have with your health care provider. Document Released: 08/12/2015 Document Revised: 04/04/2016 Document Reviewed: 05/17/2015 Elsevier Interactive Patient Education  2017 [place] Prevention in the Home Falls can cause injuries. They can happen to people of all ages. There are many things you can do to make your home safe and to help prevent falls. What can I do on the outside of my home? Regularly fix the edges of walkways and driveways and fix any cracks. Remove anything that might make you trip as you walk through a door, such as a raised step or threshold. Trim any bushes or trees on the path to your home. Use bright outdoor lighting. Clear any walking paths of anything that might make someone trip, such as rocks or tools. Regularly check to see if handrails are loose or broken. Make sure that both sides of any steps have handrails. Any raised decks and porches should have guardrails on the edges. Have any leaves, snow, or ice cleared regularly. Use sand or salt on walking paths during winter. Clean up any spills in your garage right away. This includes oil or grease spills. What can I do in the bathroom? Use night lights. Install grab bars by the toilet and in the tub and shower. Do not use towel bars as grab  bars. Use non-skid mats or decals in the tub or shower. If you need to sit down in the shower, use a plastic, non-slip stool. Keep the floor dry. Clean up any water that spills on the floor as soon as it happens. Remove soap buildup in the tub or shower regularly. Attach bath mats securely with double-sided non-slip rug tape. Do not have throw rugs and other things on the floor that can make you trip. What can I do in the bedroom? Use night lights. Make sure that you have a light by your bed that is easy to reach. Do not use any sheets or blankets that are too big for your bed. They should not hang down onto the floor. Have a firm chair that has side arms. You can use this for support while you get dressed. Do not have throw rugs and other things on the floor that can make you trip. What can I do in the kitchen? Clean up any spills right away. Avoid walking on wet floors. Keep items that you use a lot in easy-to-reach places. If you need to reach something above you, use a strong step stool that has a grab bar. Keep electrical cords out of the way. Do not use floor polish or wax that makes floors slippery. If you must use wax, use non-skid floor wax. Do not have throw rugs and other things on the floor that can make you trip. What  can I do with my stairs? Do not leave any items on the stairs. Make sure that there are handrails on both sides of the stairs and use them. Fix handrails that are broken or loose. Make sure that handrails are as long as the stairways. Check any carpeting to make sure that it is firmly attached to the stairs. Fix any carpet that is loose or worn. Avoid having throw rugs at the top or bottom of the stairs. If you do have throw rugs, attach them to the floor with carpet tape. Make sure that you have a light switch at the top of the stairs and the bottom of the stairs. If you do not have them, ask someone to add them for you. What else can I do to help prevent  falls? Wear shoes that: Do not have high heels. Have rubber bottoms. Are comfortable and fit you well. Are closed at the toe. Do not wear sandals. If you use a stepladder: Make sure that it is fully opened. Do not climb a closed stepladder. Make sure that both sides of the stepladder are locked into place. Ask someone to hold it for you, if possible. Clearly mark and make sure that you can see: Any grab bars or handrails. First and last steps. Where the edge of each step is. Use tools that help you move around (mobility aids) if they are needed. These include: Canes. Walkers. Scooters. Crutches. Turn on the lights when you go into a dark area. Replace any light bulbs as soon as they burn out. Set up your furniture so you have a clear path. Avoid moving your furniture around. If any of your floors are uneven, fix them. If there are any pets around you, be aware of where they are. Review your medicines with your doctor. Some medicines can make you feel dizzy. This can increase your chance of falling. Ask your doctor what other things that you can do to help prevent falls. This information is not intended to replace advice given to you by your health care provider. Make sure you discuss any questions you have with your health care provider. Document Released: 05/12/2009 Document Revised: 12/22/2015 Document Reviewed: 08/20/2014 Elsevier Interactive Patient Education  2017 [employer].

## 2022-08-07 NOTE — Progress Notes (Signed)
Virtual Visit via Telephone Note  I connected with  Barbara Anderson on 08/07/22 at 10:30 AM EST by telephone and verified that I am speaking with the correct person using two identifiers.  Location: Patient: home Provider: [facility] Persons participating in the virtual visit: [place]   I discussed the limitations, risks, security and privacy concerns of performing an evaluation and management service by telephone and the availability of in person appointments. The patient expressed understanding and agreed to proceed.  Interactive audio and video telecommunications were attempted between this nurse and patient, however failed, due to patient having technical difficulties OR patient did not have access to video capability.  We continued and completed visit with audio only.  Some vital signs may be absent or patient reported.   Dionisio David, LPN  Subjective:   Barbara Anderson is a 52 y.o. female who presents for Medicare Annual (Subsequent) preventive examination.  Review of Systems     Cardiac Risk Factors include: advanced age (>18mn, >>49women);hypertension     Objective:    Today's Vitals   08/07/22 1029  PainSc: 4    There is no height or weight on file to calculate BMI.     08/07/2022   10:35 AM 12/11/2019    7:44 AM 11/10/2019    9:56 AM 01/06/2019   11:21 AM 01/05/2019   11:31 PM 08/15/2018    8:46 PM 04/10/2017    6:35 PM  Advanced Directives  Does Patient Have a Medical Advance Directive? No No No No No No   Would patient like information on creating a medical advance directive? No - Patient declined  No - Patient declined No - Patient declined No - Patient declined No - Patient declined      Information is confidential and restricted. Go to Review Flowsheets to unlock data.    Current Medications (verified) Outpatient Encounter Medications as of 08/07/2022  Medication Sig   Adapalene (DIFFERIN) 0.3 % gel Apply 1 application topically at bedtime.    Alcohol Swabs (B-D SINGLE USE SWABS REGULAR) PADS USE TO CLEANSE SKIN EVERY DAY BEFORE CHECKING BLOOD SUGAR   ALPRAZolam (XANAX) 1 MG tablet Take 1 mg by mouth 2 (two) times daily as needed.    amphetamine-dextroamphetamine (ADDERALL) 20 MG tablet Take 1 tablet (20 mg total) by mouth in the morning, at noon, and at bedtime. Has not started yet 11/10/19   ARIPiprazole (ABILIFY) 5 MG tablet Take 5 mg by mouth daily.   atorvastatin (LIPITOR) 20 MG tablet Take 1 tablet (20 mg total) by mouth daily.   Cholecalciferol (VITAMIN D3) 25 MCG (1000 UT) CAPS Take 1 capsule (1,000 Units total) by mouth daily.   DULoxetine (CYMBALTA) 60 MG capsule Take 2 capsules (120 mg total) by mouth daily.   ferrous sulfate 325 (65 FE) MG tablet Take by mouth.   fluticasone (FLONASE) 50 MCG/ACT nasal spray USE 2 SPRAYS IN BOTH NOSTRILS  DAILY   folic acid (FOLVITE) 8637MCG tablet Take 1 tablet (800 mcg total) by mouth daily.   gabapentin (NEURONTIN) 300 MG capsule Take 1 capsule (300 mg total) by mouth at bedtime.   Lancets Misc. (ACCU-CHEK FASTCLIX LANCET) KIT To check blood sugar once daily   levothyroxine (SYNTHROID) 25 MCG tablet Take 1 tablet (25 mcg total) by mouth daily before breakfast.   lisinopril (ZESTRIL) 5 MG tablet Take 1 tablet (5 mg total) by mouth daily.   metoprolol succinate (TOPROL-XL) 25 MG 24 hr tablet Take 1 tablet (25 mg  total) by mouth daily.   QUEtiapine Fumarate (SEROQUEL XR) 150 MG 24 hr tablet Take 150 mg by mouth at bedtime.    ramelteon (ROZEREM) 8 MG tablet Take 1 tablet (8 mg total) by mouth at bedtime.   spironolactone (ALDACTONE) 25 MG tablet Take 1 tablet (25 mg total) by mouth daily.   tiZANidine (ZANAFLEX) 2 MG tablet Take 1 tablet (2 mg total) by mouth 3 (three) times daily as needed for muscle spasms.   topiramate (TOPAMAX) 200 MG tablet Take 1 tablet (200 mg total) by mouth at bedtime.   Vitamin D, Ergocalciferol, (DRISDOL) 1.25 MG (50000 UNIT) CAPS capsule Take 1 capsule (50,000  Units total) by mouth every 7 (seven) days. Take with meal.   VRAYLAR 1.5 MG capsule Take 1.5 mg by mouth daily.   levofloxacin (LEVAQUIN) 500 MG tablet SMARTSIG:1 Tablet(s) By Mouth (Patient not taking: Reported on 08/07/2022)   metoCLOPramide (REGLAN) 10 MG tablet Take by mouth. (Patient not taking: Reported on 08/07/2022)   Semaglutide, 1 MG/DOSE, (OZEMPIC, 1 MG/DOSE,) 4 MG/3ML SOPN INJECT '1MG'$  UNDER THE SKIN ONCE WEEKLY AS DIRECTED (Patient not taking: Reported on 08/07/2022)   traZODone (DESYREL) 150 MG tablet Take 1 tablet (150 mg total) by mouth at bedtime. (Patient not taking: Reported on 08/07/2022)   [DISCONTINUED] DULoxetine (CYMBALTA) 60 MG capsule Take by mouth.   No facility-administered encounter medications on file as of 08/07/2022.    Allergies (verified) Metformin and related   History: Past Medical History:  Diagnosis Date   ADD (attention deficit disorder)    Anxiety    Back pain    Cholecystitis, acute 01/06/2019   Concussion 2001   head trauma-work related. Caused headaches and short term memory loss.   Depression    Diabetes mellitus without complication ([place])    Overactive bladder    Recurrent vaginitis    Past Surgical History:  Procedure Laterality Date   ankle pin and plates placement     right   AUGMENTATION MAMMAPLASTY     CHOLECYSTECTOMY N/A 01/06/2019   Procedure: LAPAROSCOPIC CHOLECYSTECTOMY;  Surgeon: Vickie Epley, MD;  Location: [facility] ORS;  Service: General;  Laterality: N/A;   COLONOSCOPY WITH PROPOFOL N/A 12/11/2019   Procedure: COLONOSCOPY WITH PROPOFOL;  Surgeon: Lin Landsman, MD;  Location: [facility] ENDOSCOPY;  Service: Gastroenterology;  Laterality: N/A;   COSMETIC SURGERY  2005   BREASTS, ARM, ABDOMEN AFTER WEIGHT LOSS   Family History  Problem Relation Age of Onset   Diabetes Mother    Hypertension Mother    Depression Mother    Diabetes Father    Hypertension Father    Anxiety disorder Father    Depression Father    ADD / ADHD Father     Depression Sister    Bipolar disorder Sister    ADD / ADHD Brother    Breast cancer Neg Hx    Social History   Socioeconomic History   Marital status: Divorced    Spouse name: Not on file   Number of children: 0   Years of education: Not on file   Highest education level: Bachelor's degree (e.g., BA, AB, BS)  Occupational History   Occupation: disability  Tobacco Use   Smoking status: Never   Smokeless tobacco: Never  Vaping Use   Vaping Use: Never used  Substance and Sexual Activity   Alcohol use: No    Alcohol/week: 0.0 standard drinks of alcohol   Drug use: No   Sexual activity: Yes    Partners:  Male    Birth control/protection: None  Other Topics Concern   Not on file  Social History Narrative   Not on file   Social Determinants of Health   Financial Resource Strain: Low Risk  (08/07/2022)   Overall Financial Resource Strain (CARDIA)    Difficulty of Paying Living Expenses: Not very hard  Food Insecurity: No Food Insecurity (08/07/2022)   Hunger Vital Sign    Worried About Running Out of Food in the Last Year: Never true    Ran Out of Food in the Last Year: Never true  Transportation Needs: No Transportation Needs (08/07/2022)   PRAPARE - Hydrologist (Medical): No    Lack of Transportation (Non-Medical): No  Physical Activity: Inactive (08/07/2022)   Exercise Vital Sign    Days of Exercise per Week: 0 days    Minutes of Exercise per Session: 0 min  Stress: No Stress Concern Present (08/07/2022)   [facility]    Feeling of Stress : Only a little  Social Connections: Socially Isolated (08/07/2022)   Social Connection and Isolation Panel [NHANES]    Frequency of Communication with Friends and Family: More than three times a week    Frequency of Social Gatherings with Friends and Family: Once a week    Attends Religious Services: Never    Marine scientist or  Organizations: No    Attends Music therapist: Never    Marital Status: Divorced    Tobacco Counseling Counseling given: Not Answered   Clinical Intake:  Pre-visit preparation completed: Yes  Pain : 0-10 Pain Score: 4  Pain Type: Chronic pain Pain Location: Back     Nutritional Risks: None Diabetes: No  How often do you need to have someone help you when you read instructions, pamphlets, or other written materials from your doctor or pharmacy?: 1 - Never  Diabetic?no  Interpreter Needed?: No  Information entered by :: Kirke Shaggy, LPN   Activities of Daily Living    08/07/2022   10:36 AM 08/06/2022    2:05 PM  In your present state of health, do you have any difficulty performing the following activities:  Hearing? 0 0  Vision? 0 0  Difficulty concentrating or making decisions? 0 1  Walking or climbing stairs? 1 1  Dressing or bathing? 0 0  Doing errands, shopping? 0 0  Preparing Food and eating ? N N  Using the Toilet? N N  In the past six months, have you accidently leaked urine? N N  Do you have problems with loss of bowel control? N N  Managing your Medications? N N  Managing your Finances? N N  Housekeeping or managing your Housekeeping? Tempie Donning    Patient Care Team: Gwyneth Sprout, FNP as PCP - General (Family Medicine) [place] Crane, MD as Consulting Physician (Psychiatry) Lavonia Dana, MD as Consulting Physician (Nephrology) Chinita Pester, MD as Referring Physician (Anesthesiology) Lorelee Cover., MD (Ophthalmology) Germaine Pomfret, [facility] as Pharmacist (Pharmacist)  Indicate any recent Medical Services you may have received from other than [facility] providers in the past year (date may be approximate).     Assessment:   This is a routine wellness examination for Kaityln.  Hearing/Vision screen Hearing Screening - Comments:: No aids Vision Screening - Comments:: Readers- [facility]  Dietary issues and exercise activities  discussed: Current Exercise Habits: The patient does not participate in regular exercise at present  Goals Addressed             This Visit's Progress    DIET - EAT MORE FRUITS AND VEGETABLES         Depression Screen    08/07/2022   10:33 AM 04/20/2022    1:07 PM 01/26/2022    1:29 PM 10/26/2021    2:25 PM 05/12/2021    3:37 PM 04/28/2021   12:54 PM 03/28/2021    3:29 PM  PHQ 2/9 Scores  PHQ - 2 Score 0 '5 3 2 3 4 5  '$ PHQ- 9 Score 0 '15 18 15 14 14 19    '$ Fall Risk    08/07/2022   10:36 AM 08/06/2022    2:05 PM 04/20/2022    1:07 PM 01/26/2022    1:29 PM 05/12/2021    3:38 PM  Fall Risk   Falls in the past year? '1 1 1 1 1  '$ Number falls in past yr: '1 1 1 1 1  '$ Injury with Fall? 0 0 1 0 0  Risk for fall due to : History of fall(s)    History of fall(s);Impaired balance/gait;Impaired mobility  Follow up Falls prevention discussed;Falls evaluation completed    Falls evaluation completed    FALL RISK PREVENTION PERTAINING TO THE HOME:  Any stairs in or around the home? Yes  If so, are there any without handrails? No  Home free of loose throw rugs in walkways, pet beds, electrical cords, etc? Yes  Adequate lighting in your home to reduce risk of falls? Yes   ASSISTIVE DEVICES UTILIZED TO PREVENT FALLS:  Life alert? No  Use of a cane, walker or w/c? Yes  Grab bars in the bathroom? Yes  Shower chair or bench in shower? Yes  Elevated toilet seat or a handicapped toilet? Yes   Cognitive Function:        08/07/2022   10:37 AM 04/28/2021   12:56 PM  6CIT Screen  What Year? 0 points 0 points  What month? 0 points 0 points  What time? 0 points 0 points  Count back from 20 0 points 2 points  Months in reverse 0 points 4 points  Repeat phrase 0 points 8 points  Total Score 0 points 14 points    Immunizations Immunization History  Administered Date(s) Administered   Influenza,inj,Quad PF,6+ Mos 05/09/2017, 05/21/2018, 05/12/2021, 04/20/2022   PNEUMOCOCCAL CONJUGATE-20  07/26/2021   Pneumococcal Polysaccharide-23 07/21/2018   Zoster Recombinat (Shingrix) 07/26/2021, 10/26/2021    TDAP status: Due, Education has been provided regarding the importance of this vaccine. Advised may receive this vaccine at local pharmacy or Health Dept. Aware to provide a copy of the vaccination record if obtained from local pharmacy or Health Dept. Verbalized acceptance and understanding.  Flu Vaccine status: Up to date  Pneumococcal vaccine status: Up to date  Covid-19 vaccine status: Declined, Education has been provided regarding the importance of this vaccine but patient still declined. Advised may receive this vaccine at local pharmacy or Health Dept.or vaccine clinic. Aware to provide a copy of the vaccination record if obtained from local pharmacy or Health Dept. Verbalized acceptance and understanding.  Qualifies for Shingles Vaccine? Yes   Zostavax completed No   Shingrix Completed?: Yes  Screening Tests Health Maintenance  Topic Date Due   COVID-19 Vaccine (1) Never done   DTaP/Tdap/Td (1 - Tdap) Never done   FOOT EXAM  07/22/2019   COLONOSCOPY (Pts 45-27yr Insurance coverage will need to be confirmed)  12/10/2020  MAMMOGRAM  03/27/2022   PAP SMEAR-Modifier  07/07/2022   HEMOGLOBIN A1C  07/28/2022   Diabetic kidney evaluation - Urine ACR  10/27/2022   Diabetic kidney evaluation - eGFR measurement  01/27/2023   OPHTHALMOLOGY EXAM  05/02/2023   Medicare Annual Wellness (AWV)  08/08/2023   INFLUENZA VACCINE  Completed   Hepatitis C Screening  Completed   HIV Screening  Completed   Zoster Vaccines- Shingrix  Completed   HPV VACCINES  Aged Out    Health Maintenance  Health Maintenance Due  Topic Date Due   COVID-19 Vaccine (1) Never done   DTaP/Tdap/Td (1 - Tdap) Never done   FOOT EXAM  07/22/2019   COLONOSCOPY (Pts 45-33yr Insurance coverage will need to be confirmed)  12/10/2020   MAMMOGRAM  03/27/2022   PAP SMEAR-Modifier  07/07/2022    HEMOGLOBIN A1C  07/28/2022    Colorectal cancer screening: Type of screening: Colonoscopy. Completed 12/11/19. Repeat every 1 years  Mammogram status: Completed 03/27/21. Repeat every year    Lung Cancer Screening: (Low Dose CT Chest recommended if Age 54-80years, 30 pack-year currently smoking OR have quit w/in 15years.) does not qualify.   Additional Screening:  Hepatitis C Screening: does qualify; Completed 07/08/19  Vision Screening: Recommended annual ophthalmology exams for early detection of glaucoma and other disorders of the eye. Is the patient up to date with their annual eye exam?  Yes  Who is the provider or what is the name of the office in which the patient attends annual [facility] exams[place] [facility] [place] [facility] is not established with a provider, would they like to be referred to a provider to establish care? No .   Dental Screening: Recommended annual dental exams for proper oral hygiene  Community Resource Referral / Chronic Care Management: CRR required this visit?  No   CCM required this visit?  No      Plan:     I have personally reviewed and noted the following in the patient's chart:   Medical and social history Use of alcohol, tobacco or illicit drugs  Current medications and supplements including opioid prescriptions. Patient is not currently taking opioid prescriptions. Functional ability and status Nutritional status Physical activity Advanced directives List of other physicians Hospitalizations, surgeries, and ER visits in previous 12 months Vitals Screenings to include cognitive, depression, and falls Referrals and appointments  In addition, I have reviewed and discussed with patient certain preventive protocols, quality metrics, and best practice recommendations. A written personalized care plan for preventive services as well as general preventive health recommendations were provided to patient.     LDionisio David LPN   05/07/3234  Nurse Notes:  none

## 2022-08-08 ENCOUNTER — Other Ambulatory Visit: Payer: Self-pay | Admitting: Family Medicine

## 2022-08-08 MED ORDER — FOLIC ACID 800 MCG PO TABS: 800.0000 ug | ORAL_TABLET | Freq: Every day | ORAL | 3 refills | Status: AC

## 2022-08-08 NOTE — Progress Notes (Unsigned)
I,Cordelro Gautreau S Jla Reynolds,acting as a Education administrator for Lavon Paganini, MD.,have documented all relevant documentation on the behalf of Lavon Paganini, MD,as directed by  Lavon Paganini, MD while in the presence of Lavon Paganini, MD.   MyChart Video Visit    Virtual Visit via Video Note   This format is felt to be most appropriate for this patient at this time. Physical exam was limited by quality of the video and audio technology used for the visit.    Patient location: home Provider location: [place] involved in the visit: patient, provider  I discussed the limitations of evaluation and management by telemedicine and the availability of in person appointments. The patient expressed understanding and agreed to proceed.  Patient: Barbara Anderson   DOB: 1971-07-02   52 y.o. Female  MRN: [id number] Visit Date: 08/09/2022  Today's healthcare provider: Lavon Paganini, MD   Chief Complaint  Patient presents with   hyperpigmentation   Subjective    HPI  Patient C/O hyperpigmentation all over her face x couple of years. She denies rash, redness, or swelling.  She reports has seen dermatology in the past. She was prescribed hydroquinone, reports cream did help with hyperpigmentation. Was told that they would fade but it has been a year and they have not faded without the medication.   Medications: Outpatient Medications Prior to Visit  Medication Sig   Adapalene (DIFFERIN) 0.3 % gel Apply 1 application topically at bedtime.   Alcohol Swabs (B-D SINGLE USE SWABS REGULAR) PADS USE TO CLEANSE SKIN EVERY DAY BEFORE CHECKING BLOOD SUGAR   ALPRAZolam (XANAX) 1 MG tablet Take 1 mg by mouth 2 (two) times daily as needed.    amphetamine-dextroamphetamine (ADDERALL) 20 MG tablet Take 1 tablet (20 mg total) by mouth in the morning, at noon, and at bedtime. Has not started yet 11/10/19   ARIPiprazole (ABILIFY) 5 MG tablet Take 5 mg by mouth daily.   atorvastatin  (LIPITOR) 20 MG tablet Take 1 tablet (20 mg total) by mouth daily.   Cholecalciferol (VITAMIN D3) 25 MCG (1000 UT) CAPS Take 1 capsule (1,000 Units total) by mouth daily.   DULoxetine (CYMBALTA) 60 MG capsule Take 2 capsules (120 mg total) by mouth daily.   ferrous sulfate 325 (65 FE) MG tablet Take by mouth.   fluticasone (FLONASE) 50 MCG/ACT nasal spray USE 2 SPRAYS IN BOTH NOSTRILS  DAILY   folic acid (FOLVITE) 101 MCG tablet Take 1 tablet (800 mcg total) by mouth daily.   gabapentin (NEURONTIN) 300 MG capsule Take 1 capsule (300 mg total) by mouth at bedtime.   Lancets Misc. (ACCU-CHEK FASTCLIX LANCET) KIT To check blood sugar once daily   levofloxacin (LEVAQUIN) 500 MG tablet    levothyroxine (SYNTHROID) 25 MCG tablet Take 1 tablet (25 mcg total) by mouth daily before breakfast.   lisinopril (ZESTRIL) 5 MG tablet Take 1 tablet (5 mg total) by mouth daily.   metoCLOPramide (REGLAN) 10 MG tablet Take by mouth.   metoprolol succinate (TOPROL-XL) 25 MG 24 hr tablet Take 1 tablet (25 mg total) by mouth daily.   QUEtiapine Fumarate (SEROQUEL XR) 150 MG 24 hr tablet Take 150 mg by mouth at bedtime.    ramelteon (ROZEREM) 8 MG tablet Take 1 tablet (8 mg total) by mouth at bedtime.   spironolactone (ALDACTONE) 25 MG tablet Take 1 tablet (25 mg total) by mouth daily.   tiZANidine (ZANAFLEX) 2 MG tablet Take 1 tablet (2 mg total) by mouth 3 (three) times daily  as needed for muscle spasms.   topiramate (TOPAMAX) 200 MG tablet Take 1 tablet (200 mg total) by mouth at bedtime.   traZODone (DESYREL) 150 MG tablet Take 1 tablet (150 mg total) by mouth at bedtime.   Vitamin D, Ergocalciferol, (DRISDOL) 1.25 MG (50000 UNIT) CAPS capsule Take 1 capsule (50,000 Units total) by mouth every 7 (seven) days. Take with meal.   VRAYLAR 1.5 MG capsule Take 1.5 mg by mouth daily.   Semaglutide, 1 MG/DOSE, (OZEMPIC, 1 MG/DOSE,) 4 MG/3ML SOPN INJECT '1MG'$  UNDER THE SKIN ONCE WEEKLY AS DIRECTED   No facility-administered  medications prior to visit.    Review of Systems  Skin:  Positive for color change.       Objective    There were no vitals taken for this visit.     Physical Exam Constitutional:      General: She is not in acute distress.    Appearance: Normal appearance.  HENT:     Head: Normocephalic.  Pulmonary:     Effort: Pulmonary effort is normal. No respiratory distress.  Skin:    Findings: Rash (splotchy hyperpigmentation of face) present.  Neurological:     Mental Status: She is alert and oriented to person, place, and time. Mental status is at baseline.        Assessment & Plan     Problem List Items Addressed This Visit       Musculoskeletal and Integument   Melasma - Primary    Recurrent issue Longstanding melasma Was treated by Derm in the past with hydroquinone cream and it worked really well At last visit with a new Derm was told to do nothing and it would fade  It has not and patient would like to resume hydroquinone cream Rx'd today Discussed risk of hypopigmentation/skin lightening If not improving, refer back to derm Consider Vit C serum for maintenance in the future       Meds ordered this encounter  Medications   hydroquinone 2 % cream    Sig: Apply topically at bedtime.    Dispense:  70 g    Refill:  1     Return if symptoms worsen or fail to improve.     I discussed the assessment and treatment plan with the patient. The patient was provided an opportunity to ask questions and all were answered. The patient agreed with the plan and demonstrated an understanding of the instructions.   The patient was advised to call back or seek an in-person evaluation if the symptoms worsen or if the condition fails to improve as anticipated.  I, Lavon Paganini, MD, have reviewed all documentation for this visit. The documentation on 08/09/22 for the exam, diagnosis, procedures, and orders are all accurate and complete.   Bacigalupo, Dionne Bucy, MD,  [facility]

## 2022-08-09 ENCOUNTER — Telehealth (INDEPENDENT_AMBULATORY_CARE_PROVIDER_SITE_OTHER): Payer: Medicare Other | Admitting: Family Medicine

## 2022-08-09 ENCOUNTER — Encounter: Payer: Self-pay | Admitting: Family Medicine

## 2022-08-09 DIAGNOSIS — L811 Chloasma: Secondary | ICD-10-CM | POA: Diagnosis not present

## 2022-08-09 MED ORDER — HYDROQUINONE 2 % EX CREA: TOPICAL_CREAM | Freq: Every day | CUTANEOUS | 1 refills | Status: DC

## 2022-08-09 NOTE — Assessment & Plan Note (Signed)
Recurrent issue Longstanding melasma Was treated by Derm in the past with hydroquinone cream and it worked really well At last visit with a new Derm was told to do nothing and it would fade  It has not and patient would like to resume hydroquinone cream Rx'd today Discussed risk of hypopigmentation/skin lightening If not improving, refer back to derm Consider Vit C serum for maintenance in the future

## 2022-09-12 DIAGNOSIS — K912 Postsurgical malabsorption, not elsewhere classified: Secondary | ICD-10-CM | POA: Diagnosis not present

## 2022-09-12 DIAGNOSIS — Z9884 Bariatric surgery status: Secondary | ICD-10-CM | POA: Diagnosis not present

## 2022-09-12 DIAGNOSIS — Z01818 Encounter for other preprocedural examination: Secondary | ICD-10-CM | POA: Diagnosis not present

## 2022-10-10 DIAGNOSIS — Z9884 Bariatric surgery status: Secondary | ICD-10-CM | POA: Diagnosis not present

## 2022-10-10 DIAGNOSIS — R7303 Prediabetes: Secondary | ICD-10-CM | POA: Diagnosis not present

## 2022-10-11 DIAGNOSIS — R7309 Other abnormal glucose: Secondary | ICD-10-CM | POA: Diagnosis not present

## 2022-10-19 ENCOUNTER — Telehealth: Payer: Self-pay

## 2022-10-19 ENCOUNTER — Encounter: Payer: Self-pay | Admitting: Family Medicine

## 2022-10-19 ENCOUNTER — Ambulatory Visit (INDEPENDENT_AMBULATORY_CARE_PROVIDER_SITE_OTHER): Payer: 59 | Admitting: Family Medicine

## 2022-10-19 VITALS — BP 136/97 | HR 74 | Ht 67.0 in | Wt 209.0 lb

## 2022-10-19 DIAGNOSIS — I152 Hypertension secondary to endocrine disorders: Secondary | ICD-10-CM | POA: Diagnosis not present

## 2022-10-19 DIAGNOSIS — S069X9S Unspecified intracranial injury with loss of consciousness of unspecified duration, sequela: Secondary | ICD-10-CM

## 2022-10-19 DIAGNOSIS — R42 Dizziness and giddiness: Secondary | ICD-10-CM

## 2022-10-19 DIAGNOSIS — E1159 Type 2 diabetes mellitus with other circulatory complications: Secondary | ICD-10-CM

## 2022-10-19 DIAGNOSIS — S069XAS Unspecified intracranial injury with loss of consciousness status unknown, sequela: Secondary | ICD-10-CM | POA: Diagnosis not present

## 2022-10-19 NOTE — Patient Instructions (Signed)
Please call and schedule your mammogram:  [facility]  [address], [address] [facility] [place],  [place]  [address] Get Driving Directions Main: [phone]  Sunday:Closed Monday:7:20 AM - 5:00 PM Tuesday:7:20 AM - 5:00 PM Wednesday:7:20 AM - 5:00 PM Thursday:7:20 AM - 5:00 PM Friday:7:20 AM - 4:30 PM Saturday:Closed  

## 2022-10-19 NOTE — Progress Notes (Unsigned)
Established patient visit  Patient: Barbara Anderson   DOB: 11/26/1970   52 y.o. Female  MRN: [id number] Visit Date: 10/19/2022  Today's healthcare provider: Gwyneth Sprout, FNP  Re Introduced to nurse practitioner role and practice setting.  All questions answered.  Discussed provider/patient relationship and expectations.  Subjective    HPI HPI   6 months f/u.-pt stated--wanted to talk to you about POTS syndrome for dizziness. Last edited by Elta Guadeloupe, CMA on 10/19/2022  1:11 PM.      Jodene Nam Duration: months Description of symptoms: lightheaded Duration of episode: minutes Dizziness frequency: recurrent Provoking factors: none Aggravating factors:   stress Triggered by rolling over in bed: no Triggered by bending over: yes Aggravated by head movement: yes Aggravated by exertion, coughing, loud noises: no Recent head injury: yes Recent or current viral symptoms: no History of vasovagal episodes: no Nausea: no Vomiting: no Tinnitus: no Hearing loss: no Aural fullness: no Headache: no Photophobia/phonophobia: no Unsteady gait: yes Postural instability: yes Diplopia, dysarthria, dysphagia or weakness: no Related to exertion: yes Pallor: no Diaphoresis: no Dyspnea: no Chest pain: no   Medications: Outpatient Medications Prior to Visit  Medication Sig   Adapalene (DIFFERIN) 0.3 % gel Apply 1 application topically at bedtime.   Alcohol Swabs (B-D SINGLE USE SWABS REGULAR) PADS USE TO CLEANSE SKIN EVERY DAY BEFORE CHECKING BLOOD SUGAR   ALPRAZolam (XANAX) 1 MG tablet Take 1 mg by mouth 2 (two) times daily as needed.    amphetamine-dextroamphetamine (ADDERALL) 20 MG tablet Take 1 tablet (20 mg total) by mouth in the morning, at noon, and at bedtime. Has not started yet 11/10/19   ARIPiprazole (ABILIFY) 5 MG tablet Take 5 mg by mouth daily.   atorvastatin (LIPITOR) 20 MG tablet Take 1 tablet (20 mg total) by mouth daily.   Cholecalciferol (VITAMIN D3) 25 MCG (1000  UT) CAPS Take 1 capsule (1,000 Units total) by mouth daily.   DULoxetine (CYMBALTA) 60 MG capsule Take 2 capsules (120 mg total) by mouth daily.   ferrous sulfate 325 (65 FE) MG tablet Take by mouth.   fluticasone (FLONASE) 50 MCG/ACT nasal spray USE 2 SPRAYS IN BOTH NOSTRILS  DAILY   gabapentin (NEURONTIN) 300 MG capsule Take 1 capsule (300 mg total) by mouth at bedtime.   hydroquinone 2 % cream Apply topically at bedtime.   Lancets Misc. (ACCU-CHEK FASTCLIX LANCET) KIT To check blood sugar once daily   levofloxacin (LEVAQUIN) 500 MG tablet    levothyroxine (SYNTHROID) 25 MCG tablet Take 1 tablet (25 mcg total) by mouth daily before breakfast.   lisinopril (ZESTRIL) 5 MG tablet Take 1 tablet (5 mg total) by mouth daily.   metoCLOPramide (REGLAN) 10 MG tablet Take by mouth.   metoprolol succinate (TOPROL-XL) 25 MG 24 hr tablet Take 1 tablet (25 mg total) by mouth daily.   QUEtiapine Fumarate (SEROQUEL XR) 150 MG 24 hr tablet Take 150 mg by mouth at bedtime.    ramelteon (ROZEREM) 8 MG tablet Take 1 tablet (8 mg total) by mouth at bedtime.   Semaglutide, 1 MG/DOSE, (OZEMPIC, 1 MG/DOSE,) 4 MG/3ML SOPN INJECT 1MG  UNDER THE SKIN ONCE WEEKLY AS DIRECTED   spironolactone (ALDACTONE) 25 MG tablet Take 1 tablet (25 mg total) by mouth daily.   tiZANidine (ZANAFLEX) 2 MG tablet Take 1 tablet (2 mg total) by mouth 3 (three) times daily as needed for muscle spasms.   topiramate (TOPAMAX) 200 MG tablet Take 1 tablet (200 mg total) by mouth  at bedtime.   traZODone (DESYREL) 150 MG tablet Take 1 tablet (150 mg total) by mouth at bedtime.   VRAYLAR 1.5 MG capsule Take 1.5 mg by mouth daily.   folic acid (FOLVITE) Q000111Q MCG tablet Take 1 tablet (800 mcg total) by mouth daily. (Patient not taking: Reported on 10/19/2022)   Vitamin D, Ergocalciferol, (DRISDOL) 1.25 MG (50000 UNIT) CAPS capsule Take 1 capsule (50,000 Units total) by mouth every 7 (seven) days. Take with meal. (Patient not taking: Reported on  10/19/2022)   No facility-administered medications prior to visit.    Review of Systems  Last CBC Lab Results  Component Value Date   WBC 5.7 01/26/2022   HGB 13.1 01/26/2022   HCT 39.4 01/26/2022   MCV 88 01/26/2022   MCH 29.4 01/26/2022   RDW 13.2 01/26/2022   PLT 261 [id number]   Last metabolic panel Lab Results  Component Value Date   GLUCOSE 120 (H) 01/26/2022   NA 143 01/26/2022   K 4.3 01/26/2022   CL 111 (H) 01/26/2022   CO2 20 01/26/2022   BUN 11 01/26/2022   CREATININE 1.10 (H) 01/26/2022   EGFR 60 01/26/2022   CALCIUM 8.7 01/26/2022   PROT 5.6 (L) 01/26/2022   ALBUMIN 3.7 (L) 01/26/2022   LABGLOB 1.9 01/26/2022   AGRATIO 1.9 01/26/2022   BILITOT <0.2 01/26/2022   ALKPHOS 90 01/26/2022   AST 21 01/26/2022   ALT 23 01/26/2022   ANIONGAP 7 01/07/2019   Last lipids Lab Results  Component Value Date   CHOL 213 (H) 01/26/2022   HDL 50 01/26/2022   LDLCALC 143 (H) 01/26/2022   TRIG 114 01/26/2022   CHOLHDL 4.3 01/26/2022   Last hemoglobin A1c Lab Results  Component Value Date   HGBA1C 6.5 (H) 01/26/2022   Last thyroid functions Lab Results  Component Value Date   TSH 1.410 11/28/2021   T4TOTAL 4.6 01/24/2021   Last vitamin D Lab Results  Component Value Date   VD25OH 25.5 (L) 01/26/2022   Last vitamin B12 and Folate Lab Results  Component Value Date   VITAMINB12 257 01/01/2019   FOLATE >20.0 01/01/2019       Objective    BP (!) 136/97 (BP Location: Right Arm, Patient Position: Sitting, Cuff Size: Large)   Pulse 74   Ht 5\' 7"  (1.702 m)   Wt 209 lb (94.8 kg)   SpO2 100%   BMI 32.73 kg/m   BP Readings from Last 3 Encounters:  10/19/22 (!) 136/97  04/20/22 108/79  01/26/22 123/86   Wt Readings from Last 3 Encounters:  10/19/22 209 lb (94.8 kg)  08/07/22 261 lb (118.4 kg)  04/20/22 261 lb (118.4 kg)   SpO2 Readings from Last 3 Encounters:  10/19/22 100%  04/20/22 99%  01/26/22 95%      Physical Exam Vitals and nursing  note reviewed.  Constitutional:      General: She is not in acute distress.    Appearance: Normal appearance. She is obese. She is not ill-appearing, toxic-appearing or diaphoretic.  HENT:     Head: Normocephalic and atraumatic.  Cardiovascular:     Rate and Rhythm: Normal rate and regular rhythm.     Pulses: Normal pulses.     Heart sounds: Normal heart sounds. No murmur heard.    No friction rub. No gallop.  Pulmonary:     Effort: Pulmonary effort is normal. No respiratory distress.     Breath sounds: Normal breath sounds. No stridor. No wheezing, rhonchi  or rales.  Chest:     Chest wall: No tenderness.  Musculoskeletal:        General: No swelling, tenderness, deformity or signs of injury. Normal range of motion.     Right lower leg: No edema.     Left lower leg: No edema.  Skin:    General: Skin is warm and dry.     Capillary Refill: Capillary refill takes less than 2 seconds.     Coloration: Skin is not jaundiced or pale.     Findings: No bruising, erythema, lesion or rash.  Neurological:     General: No focal deficit present.     Mental Status: She is alert and oriented to person, place, and time. Mental status is at baseline.     Cranial Nerves: No cranial nerve deficit.     Sensory: No sensory deficit.     Motor: No weakness.     Coordination: Coordination normal.  Psychiatric:        Mood and Affect: Mood normal.        Behavior: Behavior normal.        Thought Content: Thought content normal.        Judgment: Judgment normal.     No results found for any visits on 10/19/22.  Assessment & Plan     Problem List Items Addressed This Visit       Cardiovascular and Mediastinum   Hypertension associated with diabetes ([place])    Chronic, borderline Goal <130/<90 A1c well controlled on review of notes from [facility] Continue to recommend balanced, lower carb meals. Smaller meal size, adding snacks. Choosing water as drink of choice and increasing purposeful  exercise. Patient continues to use ozempic to assist at 1 mg/week      Relevant Orders   Ambulatory referral to Neurology   Ambulatory referral to Cardiology     Nervous and Auditory   TBI (traumatic brain injury) [facility]) - Primary    Injury in 2018; patient reported concern for POTS Referral to neuro and cardiology for additional care BP is not low; and HR is not high Ongoing dizziness from initial injury- likely exacerbated from recent weight loss and possible decreased PO intake      Relevant Orders   Ambulatory referral to Neurology   Ambulatory referral to Cardiology     Other   Dizziness    Acute on chronic, TBI in 2018; patient reported concern for POTS Referral to neuro and cardiology for additional care BP is not low; and HR is not high Ongoing dizziness from initial injury- likely exacerbated from recent weight loss and possible decreased PO intake      Relevant Orders   Ambulatory referral to Neurology   Ambulatory referral to Cardiology   Return in about 4 months (around 02/18/2023) for chonic disease management.     Vonna Kotyk, FNP, have reviewed all documentation for this visit. The documentation on 10/21/22 for the exam, diagnosis, procedures, and orders are all accurate and complete.  Gwyneth Sprout, [place] [phone] (phone) [phone] (fax)  Barbara Anderson

## 2022-10-19 NOTE — Telephone Encounter (Signed)
Copied from [place] [phone]. Topic: Referral - Status >> Oct 19, 2022  3:34 PM Ludger Nutting wrote: Patient states that a pa will need to be sent to [facility] for the wheelchair ramp.  [phone] phone number

## 2022-10-21 DIAGNOSIS — R42 Dizziness and giddiness: Secondary | ICD-10-CM | POA: Insufficient documentation

## 2022-10-21 NOTE — Assessment & Plan Note (Signed)
Chronic, borderline Goal <130/<90 A1c well controlled on review of notes from [facility] Continue to recommend balanced, lower carb meals. Smaller meal size, adding snacks. Choosing water as drink of choice and increasing purposeful exercise. Patient continues to use ozempic to assist at 1 mg/week

## 2022-10-21 NOTE — Assessment & Plan Note (Signed)
Acute on chronic, TBI in 2018; patient reported concern for POTS Referral to neuro and cardiology for additional care BP is not low; and HR is not high Ongoing dizziness from initial injury- likely exacerbated from recent weight loss and possible decreased PO intake

## 2022-10-21 NOTE — Assessment & Plan Note (Signed)
Injury in 2018; patient reported concern for POTS Referral to neuro and cardiology for additional care BP is not low; and HR is not high Ongoing dizziness from initial injury- likely exacerbated from recent weight loss and possible decreased PO intake

## 2022-10-25 ENCOUNTER — Telehealth: Payer: Self-pay | Admitting: Family Medicine

## 2022-10-25 NOTE — Telephone Encounter (Signed)
The patient called in stating she talked with [facility] and they told her for her to get refills on Folic Acid and Vitamin D her insurance now requires prior authorization on all meds. They are faxing over requests for refills on these meds and she just wanted to give the provider a heads up. Please assist patient further

## 2022-10-26 DIAGNOSIS — R7309 Other abnormal glucose: Secondary | ICD-10-CM | POA: Diagnosis not present

## 2022-10-29 DIAGNOSIS — R7309 Other abnormal glucose: Secondary | ICD-10-CM | POA: Diagnosis not present

## 2022-10-29 NOTE — Telephone Encounter (Signed)
Patient advised. Video visit scheduled

## 2022-10-30 ENCOUNTER — Ambulatory Visit: Payer: Self-pay

## 2022-10-30 ENCOUNTER — Encounter: Payer: Self-pay | Admitting: Family Medicine

## 2022-10-30 ENCOUNTER — Other Ambulatory Visit: Payer: Self-pay | Admitting: Family Medicine

## 2022-10-30 ENCOUNTER — Telehealth (INDEPENDENT_AMBULATORY_CARE_PROVIDER_SITE_OTHER): Payer: 59 | Admitting: Family Medicine

## 2022-10-30 DIAGNOSIS — S069X9S Unspecified intracranial injury with loss of consciousness of unspecified duration, sequela: Secondary | ICD-10-CM | POA: Diagnosis not present

## 2022-10-30 DIAGNOSIS — Z9989 Dependence on other enabling machines and devices: Secondary | ICD-10-CM | POA: Diagnosis not present

## 2022-10-30 DIAGNOSIS — I152 Hypertension secondary to endocrine disorders: Secondary | ICD-10-CM

## 2022-10-30 DIAGNOSIS — R42 Dizziness and giddiness: Secondary | ICD-10-CM

## 2022-10-30 DIAGNOSIS — Z993 Dependence on wheelchair: Secondary | ICD-10-CM

## 2022-10-30 DIAGNOSIS — E1129 Type 2 diabetes mellitus with other diabetic kidney complication: Secondary | ICD-10-CM

## 2022-10-30 NOTE — Patient Outreach (Signed)
  Care Coordination   Initial Visit Note   10/30/2022 Name: Barbara Anderson MRN: [id number] DOB: 1971-06-03  Barbara Anderson is a 52 y.o. year old female who sees Gwyneth Sprout, FNP for primary care. I spoke with  Barbara Anderson by phone today.  What matters to the patients health and wellness today?  Patient is requesting assistance with resources for a ramp and food bank resources.    Goals Addressed             This Visit's Progress    Obtain a ramp for patients home to improve safety       Care Coordination Interventions:   Patient agreed to contact [employer] to Clinical research associate.  Sister agreed to assist. Patient agreed to continue to be cautious when using the stairs to enter the home. Patient will follow up with [facility] if denied by [employer] to see if her case can be reviewed. Patient will follow up with resources for food bank.  CM will mail a list.         SDOH assessments and interventions completed:  Yes  SDOH Interventions Today    Flowsheet Row Most Recent Value  SDOH Interventions   Food Insecurity Interventions Other (Comment)  [Request food bank referrals]  Housing Interventions Intervention Not Indicated  Transportation Interventions Intervention Not Indicated  Utilities Interventions Intervention Not Indicated        Care Coordination Interventions:  Yes, provided     Follow up plan: No further intervention required.   Encounter Outcome:  Pt. Visit Completed   Barbara Anderson, [place] Worker [facility] Care Management  [phone]

## 2022-10-30 NOTE — Patient Instructions (Signed)
Visit Information  Thank you for taking time to visit with me today. Please don't hesitate to contact me if I can be of assistance to you.   Following are the goals we discussed today:   Goals Addressed             This Visit's Progress    Obtain a ramp for patients home to improve safety       Care Coordination Interventions:   Patient agreed to contact [employer] to Clinical research associate.  Sister agreed to assist. Patient agreed to continue to be cautious when using the stairs to enter the home. Patient will follow up with [facility] if denied by [employer] to see if her case can be reviewed. Patient will follow up with resources for food bank.  CM will mail a list.         If you are experiencing a Mental Health or [place] or need someone to talk to, please call 911  Patient verbalizes understanding of instructions and care plan provided today and agrees to view in [place]. Active MyChart status and patient understanding of how to access instructions and care plan via MyChart confirmed with patient.     No further follow up required:    Lindie Spruce, [place] Social Worker [facility] Care Management  [phone]

## 2022-10-30 NOTE — Progress Notes (Signed)
I,Joseline E Rosas,acting as a scribe for Gwyneth Sprout, FNP.,have documented all relevant documentation on the behalf of Gwyneth Sprout, FNP,as directed by  Gwyneth Sprout, FNP while in the presence of Gwyneth Sprout, FNP.   MyChart Video Visit    Virtual Visit via Video Note   This format is felt to be most appropriate for this patient at this time. Physical exam was limited by quality of the video and audio technology used for the visit.   Patient location: Home Provider location:   I discussed the limitations of evaluation and management by telemedicine and the availability of in person appointments. The patient expressed understanding and agreed to proceed.  Patient: Barbara Anderson   DOB: Nov 13, 1970   52 y.o. Female  MRN: [id number] Visit Date: 10/30/2022  Today's healthcare provider: Gwyneth Sprout, FNP   Subjective    HPI   Easier assess for the patient to get in her house. Patient uses a Medical laboratory scientific officer.   Medications: Outpatient Medications Prior to Visit  Medication Sig   Adapalene (DIFFERIN) 0.3 % gel Apply 1 application topically at bedtime.   Alcohol Swabs (B-D SINGLE USE SWABS REGULAR) PADS USE TO CLEANSE SKIN EVERY DAY BEFORE CHECKING BLOOD SUGAR   ALPRAZolam (XANAX) 1 MG tablet Take 1 mg by mouth 2 (two) times daily as needed.    amphetamine-dextroamphetamine (ADDERALL) 20 MG tablet Take 1 tablet (20 mg total) by mouth in the morning, at noon, and at bedtime. Has not started yet 11/10/19   ARIPiprazole (ABILIFY) 5 MG tablet Take 5 mg by mouth daily.   atorvastatin (LIPITOR) 20 MG tablet Take 1 tablet (20 mg total) by mouth daily.   Cholecalciferol (VITAMIN D3) 25 MCG (1000 UT) CAPS Take 1 capsule (1,000 Units total) by mouth daily.   DULoxetine (CYMBALTA) 60 MG capsule Take 2 capsules (120 mg total) by mouth daily.   ferrous sulfate 325 (65 FE) MG tablet Take by mouth.   fluticasone (FLONASE) 50 MCG/ACT nasal spray USE 2 SPRAYS IN BOTH NOSTRILS  DAILY   folic acid  (FOLVITE) Q000111Q MCG tablet Take 1 tablet (800 mcg total) by mouth daily.   hydroquinone 2 % cream Apply topically at bedtime.   Lancets Misc. (ACCU-CHEK FASTCLIX LANCET) KIT To check blood sugar once daily   levofloxacin (LEVAQUIN) 500 MG tablet    levothyroxine (SYNTHROID) 25 MCG tablet Take 1 tablet (25 mcg total) by mouth daily before breakfast.   lisinopril (ZESTRIL) 5 MG tablet Take 1 tablet (5 mg total) by mouth daily.   metoCLOPramide (REGLAN) 10 MG tablet Take by mouth.   metoprolol succinate (TOPROL-XL) 25 MG 24 hr tablet Take 1 tablet (25 mg total) by mouth daily.   QUEtiapine Fumarate (SEROQUEL XR) 150 MG 24 hr tablet Take 150 mg by mouth at bedtime.    ramelteon (ROZEREM) 8 MG tablet Take 1 tablet (8 mg total) by mouth at bedtime.   Semaglutide, 1 MG/DOSE, (OZEMPIC, 1 MG/DOSE,) 4 MG/3ML SOPN INJECT 1MG  UNDER THE SKIN ONCE WEEKLY AS DIRECTED   spironolactone (ALDACTONE) 25 MG tablet Take 1 tablet (25 mg total) by mouth daily.   tiZANidine (ZANAFLEX) 2 MG tablet Take 1 tablet (2 mg total) by mouth 3 (three) times daily as needed for muscle spasms.   topiramate (TOPAMAX) 200 MG tablet Take 1 tablet (200 mg total) by mouth at bedtime.   traZODone (DESYREL) 150 MG tablet Take 1 tablet (150 mg total) by mouth at bedtime.   Vitamin D,  Ergocalciferol, (DRISDOL) 1.25 MG (50000 UNIT) CAPS capsule Take 1 capsule (50,000 Units total) by mouth every 7 (seven) days. Take with meal.   VRAYLAR 1.5 MG capsule Take 1.5 mg by mouth daily.   gabapentin (NEURONTIN) 300 MG capsule Take 1 capsule (300 mg total) by mouth at bedtime.   No facility-administered medications prior to visit.    Review of Systems   Objective    There were no vitals taken for this visit.   Physical Exam Pulmonary:     Effort: Pulmonary effort is normal.  Neurological:     General: No focal deficit present.     Mental Status: She is oriented to person, place, and time.  Psychiatric:        Mood and Affect: Mood normal.         Behavior: Behavior normal.        Thought Content: Thought content normal.        Judgment: Judgment normal.        Assessment & Plan     Problem List Items Addressed This Visit       Nervous and Auditory   TBI (traumatic brain injury) - Primary    Injury in 2018;  Ongoing dizziness iso disorder of labyrinth Patient presents virtually today to assist with getting either a w/c ramp or a railing added at her home. Notes that she has 4 stairs to entry both front and back; however, does not have a railing or a ramp and patient chronically uses assistive devices.       Relevant Orders   AMB Referral to Chronic Care Management Services     Other   Dizziness   Relevant Orders   AMB Referral to Chronic Care Management Services   Use of cane as ambulatory aid   Relevant Orders   AMB Referral to Chronic Care Management Services   Other Visit Diagnoses     Walker as ambulation aid       Relevant Orders   AMB Referral to Chronic Care Management Services   Uses wheelchair       Relevant Orders   AMB Referral to Chronic Care Management Services      Return if symptoms worsen or fail to improve.    I discussed the assessment and treatment plan with the patient. The patient was provided an opportunity to ask questions and all were answered. The patient agreed with the plan and demonstrated an understanding of the instructions.   The patient was advised to call back or seek an in-person evaluation if the symptoms worsen or if the condition fails to improve as anticipated.  I provided 10 minutes of face-to-face time during this encounter discussing repercussions and physical limitations from previous TBI and need for home modification to assist with safety.  Vonna Kotyk, FNP, have reviewed all documentation for this visit. The documentation on 10/30/22 for the exam, diagnosis, procedures, and orders are all accurate and complete.   Gwyneth Sprout, [place] [phone] (phone) [phone] (fax)  [place]

## 2022-10-30 NOTE — Assessment & Plan Note (Signed)
Injury in 2018;  Ongoing dizziness iso disorder of labyrinth Patient presents virtually today to assist with getting either a w/c ramp or a railing added at her home. Notes that she has 4 stairs to entry both front and back; however, does not have a railing or a ramp and patient chronically uses assistive devices.

## 2022-10-31 ENCOUNTER — Telehealth: Payer: Self-pay | Admitting: *Deleted

## 2022-10-31 ENCOUNTER — Telehealth: Payer: Self-pay

## 2022-10-31 NOTE — Telephone Encounter (Signed)
   RN Visit Note   10/31/22 Name: Lanyiah Claborn MRN: [id number]      DOB: July 11, 1971  Subjective: Benji Caballeros is a 52 y.o. year old female who is a primary care patient of Gwyneth Sprout, FNP. The patient was referred for assistance with care management needs.    An unsuccessful outreach was attempted today.  PLAN: A HIPAA compliant phone message was left for the patient providing contact information and requesting a return call.   Horris Latino RN Care Manager/Chronic Care Management [phone]

## 2022-11-09 ENCOUNTER — Ambulatory Visit: Payer: 59 | Attending: Cardiology | Admitting: Cardiology

## 2022-11-09 ENCOUNTER — Encounter: Payer: Self-pay | Admitting: Cardiology

## 2022-11-09 ENCOUNTER — Telehealth: Payer: Self-pay

## 2022-11-09 VITALS — BP 128/82 | HR 80 | Ht 67.0 in | Wt 220.0 lb

## 2022-11-09 DIAGNOSIS — E7800 Pure hypercholesterolemia, unspecified: Secondary | ICD-10-CM | POA: Diagnosis not present

## 2022-11-09 DIAGNOSIS — I10 Essential (primary) hypertension: Secondary | ICD-10-CM | POA: Diagnosis not present

## 2022-11-09 DIAGNOSIS — R42 Dizziness and giddiness: Secondary | ICD-10-CM | POA: Diagnosis not present

## 2022-11-09 NOTE — Patient Instructions (Signed)
Medication Instructions:   Your physician recommends that you continue on your current medications as directed. Please refer to the Current Medication list given to you today.  *If you need a refill on your cardiac medications before your next appointment, please call your pharmacy*   Lab Work:  NONE ORDERED  If you have labs (blood work) drawn today and your tests are completely normal, you will receive your results only by: MyChart Message (if you have MyChart) OR A paper copy in the mail If you have any lab test that is abnormal or we need to change your treatment, we will call you to review the results.   Testing/Procedures:  NONE ORDERED   Follow-Up: At [facility], you and your health needs are our priority.  As part of our continuing mission to provide you with exceptional heart care, we have created designated Provider Care Teams.  These Care Teams include your primary Cardiologist (physician) and Advanced Practice Providers (APPs -  Physician Assistants and Nurse Practitioners) who all work together to provide you with the care you need, when you need it.  We recommend signing up for the patient portal called "MyChart".  Sign up information is provided on this After Visit Summary.  MyChart is used to connect with patients for Virtual Visits (Telemedicine).  Patients are able to view lab/test results, encounter notes, upcoming appointments, etc.  Non-urgent messages can be sent to your provider as well.   To learn more about what you can do with MyChart, go to [email or web address].    Your next appointment:  as needed

## 2022-11-09 NOTE — Progress Notes (Signed)
  Chronic Care Management   Note  11/09/2022 Name: Sharran Dausch MRN: [id number] DOB: August 16, 1970  Evelyn Stachowski is a 52 y.o. year old female who is a primary care patient of Jacky Kindle, FNP. I reached out to Louie Boston by phone today in response to a referral sent by Ms. Shirlene Neubauer's PCP.  The first contact attempt was unsuccessful.   Follow up plan: Additional outreach attempts will be made.  Penne Lash, RMA Care Guide [facility]  [place], [place] [address] Direct Dial: [phone] [email or web address]

## 2022-11-09 NOTE — Progress Notes (Signed)
Cardiology Office Note:    Date:  11/09/2022   ID:  Barbara Anderson, DOB Nov 18, 1970, MRN [id number]  PCP:  Jacky Kindle, FNP   [facility] Providers Cardiologist:  None     Referring MD: Jacky Kindle, FNP   Chief Complaint  Patient presents with   New patient    Referred by PCP for dizziness at random - more so when bending over. Meds reviewed verbally with patient.    Barbara Anderson is a 52 y.o. female who is being seen today for the evaluation of dizziness at the request of Jacky Kindle, FNP.   History of Present Illness:    Barbara Anderson is a 52 y.o. 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  She states is related with symptoms of dizziness over the past 20+ years.  Symptoms began after she sustained a head injury.  Felt well before.  States having dizzy spells with standing from seated position or bending over.  Abrupt head movements from left to right also triggers symptoms.  Denies palpitations currently, heart palpitations prior to surgery, states symptoms of palpitations improved after weight loss surgery.  Denies chest pain.  Father was a smoker, with history of CABG in his 20s.  Past Medical History:  Diagnosis Date   ADD (attention deficit disorder)    Anxiety    Back pain    Cholecystitis, acute 01/06/2019   Concussion 2001   head trauma-work related. Caused headaches and short term memory loss.   Depression    Diabetes mellitus without complication    Overactive bladder    Recurrent vaginitis     Past Surgical History:  Procedure Laterality Date   ankle pin and plates placement     right   AUGMENTATION MAMMAPLASTY     CHOLECYSTECTOMY N/A 01/06/2019   Procedure: LAPAROSCOPIC CHOLECYSTECTOMY;  Surgeon: Ancil Linsey, MD;  Location: [facility] ORS;  Service: General;  Laterality: N/A;    COLONOSCOPY WITH PROPOFOL N/A 12/11/2019   Procedure: COLONOSCOPY WITH PROPOFOL;  Surgeon: Toney Reil, MD;  Location: [facility] ENDOSCOPY;  Service: Gastroenterology;  Laterality: N/A;   COSMETIC SURGERY  2005   BREASTS, ARM, ABDOMEN AFTER WEIGHT LOSS    Current Medications: Current Meds  Medication Sig   Adapalene (DIFFERIN) 0.3 % gel Apply 1 application topically at bedtime.   Alcohol Swabs (B-D SINGLE USE SWABS REGULAR) PADS USE TO CLEANSE SKIN EVERY DAY BEFORE CHECKING BLOOD SUGAR   ALPRAZolam (XANAX) 1 MG tablet Take 1 mg by mouth 2 (two) times daily as needed.    amphetamine-dextroamphetamine (ADDERALL) 20 MG tablet Take 1 tablet (20 mg total) by mouth in the morning, at noon, and at bedtime. Has not started yet 11/10/19   ARIPiprazole (ABILIFY) 5 MG tablet Take 5 mg by mouth daily.   atorvastatin (LIPITOR) 20 MG tablet Take 1 tablet (20 mg total) by mouth daily.   Cholecalciferol (VITAMIN D3) 25 MCG (1000 UT) CAPS Take 1 capsule (1,000 Units total) by mouth daily.   DULoxetine (CYMBALTA) 60 MG capsule Take 2 capsules (120 mg total) by mouth daily.   ferrous sulfate 325 (65 FE) MG tablet Take by mouth.   fluticasone (FLONASE) 50 MCG/ACT nasal spray USE 2 SPRAYS IN BOTH NOSTRILS  DAILY   folic acid (FOLVITE) 800 MCG tablet Take 1 tablet (800 mcg total) by mouth daily.   gabapentin (  NEURONTIN) 300 MG capsule Take 1 capsule (300 mg total) by mouth at bedtime.   hydroquinone 2 % cream Apply topically at bedtime.   Lancets Misc. (ACCU-CHEK FASTCLIX LANCET) KIT To check blood sugar once daily   levofloxacin (LEVAQUIN) 500 MG tablet    levothyroxine (SYNTHROID) 25 MCG tablet Take 1 tablet (25 mcg total) by mouth daily before breakfast.   lisinopril (ZESTRIL) 5 MG tablet Take 1 tablet (5 mg total) by mouth daily.   metoCLOPramide (REGLAN) 10 MG tablet Take by mouth.   metoprolol succinate (TOPROL-XL) 25 MG 24 hr tablet Take 1 tablet (25 mg total) by mouth daily.   pregabalin (LYRICA) 200  MG capsule Take 200 mg by mouth 2 (two) times daily.   QUEtiapine Fumarate (SEROQUEL XR) 150 MG 24 hr tablet Take 150 mg by mouth at bedtime.    ramelteon (ROZEREM) 8 MG tablet Take 1 tablet (8 mg total) by mouth at bedtime.   Semaglutide, 1 MG/DOSE, (OZEMPIC, 1 MG/DOSE,) 4 MG/3ML SOPN INJECT 1MG  UNDER THE SKIN ONCE WEEKLY AS DIRECTED   spironolactone (ALDACTONE) 25 MG tablet Take 1 tablet (25 mg total) by mouth daily.   tiZANidine (ZANAFLEX) 2 MG tablet Take 1 tablet (2 mg total) by mouth 3 (three) times daily as needed for muscle spasms.   topiramate (TOPAMAX) 200 MG tablet Take 1 tablet (200 mg total) by mouth at bedtime.   traZODone (DESYREL) 150 MG tablet Take 1 tablet (150 mg total) by mouth at bedtime.   Vitamin D, Ergocalciferol, (DRISDOL) 1.25 MG (50000 UNIT) CAPS capsule Take 1 capsule (50,000 Units total) by mouth every 7 (seven) days. Take with meal.   VRAYLAR 1.5 MG capsule Take 1.5 mg by mouth daily.     Allergies:   Metformin and related   Social History   Socioeconomic History   Marital status: Divorced    Spouse name: Not on file   Number of children: 0   Years of education: Not on file   Highest education level: Bachelor's degree (e.g., BA, AB, BS)  Occupational History   Occupation: disability  Tobacco Use   Smoking status: Never   Smokeless tobacco: Never  Vaping Use   Vaping Use: Never used  Substance and Sexual Activity   Alcohol use: No    Alcohol/week: 0.0 standard drinks of alcohol   Drug use: No   Sexual activity: Yes    Partners: Male    Birth control/protection: None  Other Topics Concern   Not on file  Social History Narrative   Not on file   Social Determinants of Health   Financial Resource Strain: Low Risk  (08/07/2022)   Overall Financial Resource Strain (CARDIA)    Difficulty of Paying Living Expenses: Not very hard  Food Insecurity: Food Insecurity Present (10/30/2022)   Hunger Vital Sign    Worried About Running Out of Food in the Last  Year: Sometimes true    Ran Out of Food in the Last Year: Sometimes true  Transportation Needs: No Transportation Needs (10/30/2022)   PRAPARE - Administrator, Civil Service (Medical): No    Lack of Transportation (Non-Medical): No  Physical Activity: Inactive (08/07/2022)   Exercise Vital Sign    Days of Exercise per Week: 0 days    Minutes of Exercise per Session: 0 min  Stress: No Stress Concern Present (08/07/2022)   [employer] of Occupational Health - Occupational Stress Questionnaire    Feeling of Stress : Only a little  Social  Connections: Socially Isolated (08/07/2022)   Social Connection and Isolation Panel [NHANES]    Frequency of Communication with Friends and Family: More than three times a week    Frequency of Social Gatherings with Friends and Family: Once a week    Attends Religious Services: Never    Database administrator or Organizations: No    Attends Banker Meetings: Never    Marital Status: Divorced     Family History: The patient's family history includes ADD / ADHD in her brother and father; Anxiety disorder in her father; Bipolar disorder in her sister; Depression in her father, mother, and sister; Diabetes in her father and mother; Hypertension in her father and mother. There is no history of Breast cancer.  ROS:   Please see the history of present illness.     All other systems reviewed and are negative.  EKGs/Labs/Other Studies Reviewed:    The following studies were reviewed today:   EKG:  EKG is  ordered today.  The ekg ordered today demonstrates normal sinus rhythm, normal ECG  Recent Labs: 11/28/2021: TSH 1.410 01/26/2022: ALT 23; BUN 11; Creatinine, Ser 1.10; Hemoglobin 13.1; Platelets 261; Potassium 4.3; Sodium 143  Recent Lipid Panel    Component Value Date/Time   CHOL 213 (H) 01/26/2022 1400   TRIG 114 01/26/2022 1400   HDL 50 01/26/2022 1400   CHOLHDL 4.3 01/26/2022 1400   CHOLHDL 6.1 04/11/2017 0702   VLDL 30  04/11/2017 0702   LDLCALC 143 (H) 01/26/2022 1400     Risk Assessment/Calculations:             Physical Exam:    VS:  BP 128/82 (BP Location: Left Arm, Patient Position: Sitting, Cuff Size: Normal)   Pulse 80   Ht  (1.702 m)   Wt 220 lb (99.8 kg)   SpO2 98%   BMI 34.46 kg/m     Wt Readings from Last 3 Encounters:  11/09/22 220 lb (99.8 kg)  10/19/22 209 lb (94.8 kg)  08/07/22 261 lb (118.4 kg)     GEN:  Well nourished, well developed in no acute distress HEENT: Normal NECK: No JVD; No carotid bruits CARDIAC: RRR, no murmurs, rubs, gallops RESPIRATORY:  Clear to auscultation without rales, wheezing or rhonchi  ABDOMEN: Soft, non-tender, non-distended MUSCULOSKELETAL:  No edema; No deformity  SKIN: Warm and dry NEUROLOGIC:  Alert and oriented x 3 PSYCHIATRIC:  Normal affect   ASSESSMENT:    1. Dizziness   2. Primary hypertension   3. Pure hypercholesterolemia    PLAN:    In order of problems listed above:  Dizziness, associated with positional changes.  Ongoing since traumatic brain injury.  Denies palpitations.  Orthostatic vitals today with no evidence of orthostasis.  Heart rate stayed normal.  Per definition, patient symptoms not consistent with POTS.  Her symptoms better represent positional vertigo or an inner ear issue.  Recommend evaluation and management with PCP and or ENT if deemed appropriate. Hypertension, BP controlled, continue lisinopril. Hyperlipidemia, continue Lipitor.  Cholesterol likely to improve with patient losing over 50 pounds in the past 4 months.  Titration of Lipitor as per PCP.  Follow-up as needed.      Medication Adjustments/Labs and Tests Ordered: Current medicines are reviewed at length with the patient today.  Concerns regarding medicines are outlined above.  Orders Placed This Encounter  Procedures   EKG 12-Lead   No orders of the defined types were placed in this encounter.  Patient Instructions  Medication  Instructions:   Your physician recommends that you continue on your current medications as directed. Please refer to the Current Medication list given to you today.  *If you need a refill on your cardiac medications before your next appointment, please call your pharmacy*   Lab Work:  NONE ORDERED  If you have labs (blood work) drawn today and your tests are completely normal, you will receive your results only by: MyChart Message (if you have MyChart) OR A paper copy in the mail If you have any lab test that is abnormal or we need to change your treatment, we will call you to review the results.   Testing/Procedures:  NONE ORDERED   Follow-Up: At [facility], you and your health needs are our priority.  As part of our continuing mission to provide you with exceptional heart care, we have created designated Provider Care Teams.  These Care Teams include your primary Cardiologist (physician) and Advanced Practice Providers (APPs -  Physician Assistants and Nurse Practitioners) who all work together to provide you with the care you need, when you need it.  We recommend signing up for the patient portal called "MyChart".  Sign up information is provided on this After Visit Summary.  MyChart is used to connect with patients for Virtual Visits (Telemedicine).  Patients are able to view lab/test results, encounter notes, upcoming appointments, etc.  Non-urgent messages can be sent to your provider as well.   To learn more about what you can do with MyChart, go to [email or web address].    Your next appointment:  as needed    Signed, Debbe Odea, MD  11/09/2022 11:03 AM    [facility]

## 2022-11-12 ENCOUNTER — Other Ambulatory Visit: Payer: Self-pay | Admitting: Family Medicine

## 2022-11-12 DIAGNOSIS — R7309 Other abnormal glucose: Secondary | ICD-10-CM | POA: Diagnosis not present

## 2022-11-12 DIAGNOSIS — E1159 Type 2 diabetes mellitus with other circulatory complications: Secondary | ICD-10-CM

## 2022-11-13 NOTE — Telephone Encounter (Signed)
Requested Prescriptions  Pending Prescriptions Disp Refills   metoprolol succinate (TOPROL-XL) 25 MG 24 hr tablet [Pharmacy Med Name: metoprolol succinate ER 25 mg tablet,extended release 24 hr] 260 tablet 0    Sig: TAKE ONE TABLET BY MOUTH DAILY AT 9AM     Cardiovascular:  Beta Blockers Passed - 11/12/2022  4:32 PM      Passed - Last BP in normal range    BP Readings from Last 1 Encounters:  11/09/22 128/82         Passed - Last Heart Rate in normal range    Pulse Readings from Last 1 Encounters:  11/09/22 80         Passed - Valid encounter within last 6 months    Recent Outpatient Visits           2 weeks ago Traumatic brain injury with loss of consciousness, sequela   [facility] Merita Norton T, FNP   3 weeks ago Traumatic brain injury with loss of consciousness, sequela [facility])   [place] [facility] Jacky Kindle, FNP   3 months ago Melasma   [place] [facility] Erasmo Downer, MD   6 months ago Hypertension associated with diabetes [facility])   [place] [facility] Jacky Kindle, FNP   9 months ago Morbid obesity [facility])   [facility] Jacky Kindle, [place]

## 2022-11-19 NOTE — Progress Notes (Signed)
  Chronic Care Management   Note  11/19/2022 Name: Barbara Anderson MRN: [id number] DOB: 1971/03/19  Barbara Anderson is a 52 y.o. year old female who is a primary care patient of Jacky Kindle, FNP. I reached out to Louie Boston by phone today in response to a referral sent by [name]'s PCP.  [name] was given information about Chronic Care Management services today including:  CCM service includes personalized support from designated clinical staff supervised by the physician, including individualized plan of care and coordination with other care providers 24/7 contact phone numbers for assistance for urgent and routine care needs. Service will only be billed when office clinical staff spend 20 minutes or more in a month to coordinate care. Only one practitioner may furnish and bill the service in a calendar month. The patient may stop CCM services at amy time (effective at the end of the month) by phone call to the office staff. The patient will be responsible for cost sharing (co-pay) or up to 20% of the service fee (after annual deductible is met)  Ms. Louie Boston  declinedto scheduling an appointment with the CCM RN Case Manager   Follow up plan: Patient did not agree to scheduling an appointment with the RN Case Manager. The ordering provider has been notified.   Penne Lash, RMA Care Guide [facility]  [place], [place] [address] Direct Dial: [phone] [email or web address]

## 2022-11-25 ENCOUNTER — Other Ambulatory Visit: Payer: Self-pay | Admitting: Family Medicine

## 2022-11-25 DIAGNOSIS — E1159 Type 2 diabetes mellitus with other circulatory complications: Secondary | ICD-10-CM

## 2022-11-28 DIAGNOSIS — R7309 Other abnormal glucose: Secondary | ICD-10-CM | POA: Diagnosis not present

## 2022-12-03 ENCOUNTER — Other Ambulatory Visit: Payer: Self-pay | Admitting: Family Medicine

## 2022-12-03 DIAGNOSIS — E034 Atrophy of thyroid (acquired): Secondary | ICD-10-CM

## 2022-12-03 DIAGNOSIS — L700 Acne vulgaris: Secondary | ICD-10-CM

## 2022-12-03 DIAGNOSIS — E782 Mixed hyperlipidemia: Secondary | ICD-10-CM

## 2022-12-03 NOTE — Telephone Encounter (Signed)
Medication Refill - Medication: levothyroxine (SYNTHROID) 25 MCG tablet [[id number]]   atorvastatin (LIPITOR) 20 MG tablet [[id number]]   ramelteon (ROZEREM) 8 MG tablet [[id number]]   spironolactone (ALDACTONE) 25 MG tablet [[id number]]   Has the patient contacted their pharmacy? Yes.   (Agent: If no, request that the patient contact the pharmacy for the refill. If patient does not wish to contact the pharmacy document the reason why and proceed with request.) (Agent: If yes, when and what did the pharmacy advise?)  Preferred Pharmacy (with phone number or street name):  [facility] [place] [address] Phone: [phone]  Fax: [phone]     Has the patient been seen for an appointment in the last year OR does the patient have an upcoming appointment? Yes.    Agent: Please be advised that RX refills may take up to 3 business days. We ask that you follow-up with your pharmacy.

## 2022-12-04 NOTE — Telephone Encounter (Signed)
Unable to refill per protocol, Rx requests are too soon.  Requested Prescriptions  Pending Prescriptions Disp Refills   levothyroxine (SYNTHROID) 25 MCG tablet 100 tablet 2    Sig: Take 1 tablet (25 mcg total) by mouth daily before breakfast.     Endocrinology:  Hypothyroid Agents Failed - 12/03/2022 10:23 AM      Failed - TSH in normal range and within 360 days    TSH  Date Value Ref Range Status  11/28/2021 1.410 0.450 - 4.500 uIU/mL Final         Passed - Valid encounter within last 12 months    Recent Outpatient Visits           1 month ago Traumatic brain injury with loss of consciousness, sequela [facility])   [place] [facility] Mer[facility] Norton T, FNP   1 month ago Traumatic brain injury with loss of consciousness, sequela [facility])   [place] [facility] Jacky Kindle, FNP   3 months ago Melasma   [place] [facility] Erasmo Downer, MD   7 months ago Hypertension associated with diabetes [facility])   [place] [facility] Merita Norton T, FNP   10 months ago Morbid obesity [facility])   [place] [facility] Merita Norton T, FNP               atorvastatin (LIPITOR) 20 MG tablet 80 tablet 3    Sig: Take 1 tablet (20 mg total) by mouth daily.     Cardiovascular:  Antilipid - Statins Failed - 12/03/2022 10:23 AM      Failed - Lipid Panel in normal range within the last 12 months    Cholesterol, Total  Date Value Ref Range Status  01/26/2022 213 (H) 100 - 199 mg/dL Final   LDL Chol Calc (NIH)  Date Value Ref Range Status  01/26/2022 143 (H) 0 - 99 mg/dL Final   HDL  Date Value Ref Range Status  01/26/2022 50 >39 mg/dL Final   Triglycerides  Date Value Ref Range Status  01/26/2022 114 0 - 149 mg/dL Final         Passed - Patient is not pregnant      Passed - Valid encounter within last 12 months    Recent Outpatient Visits           1 month ago Traumatic brain injury with loss of  consciousness, sequela [facility])   [place] [facility] Merita Norton T, FNP   1 month ago Traumatic brain injury with loss of consciousness, sequela [facility])   [place] [facility] Jacky Kindle, FNP   3 months ago Melasma   [place] [facility] Erasmo Downer, MD   7 months ago Hypertension associated with diabetes [facility])    [facility] T, FNP   10 months ago Morbid obesity [facility]    [place] [facility] Merita Norton T, FNP               ramelteon (ROZEREM) 8 MG tablet 90 tablet 3    Sig: Take 1 tablet (8 mg total) by mouth at bedtime.     Psychiatry: Anxiolytics/Hypnotics - Non-controlled Passed - 12/03/2022 10:23 AM      Passed - Valid encounter within last 12 months    Recent Outpatient Visits           1 month ago Traumatic brain injury with loss  of consciousness, sequela [facility])   [place] [facility] Merita Norton T, FNP   1 month ago Traumatic brain injury with loss of consciousness, sequela [facility]   [place] [facility] Jacky Kindle, FNP   3 months ago Melasma   [place] [facility] Erasmo Downer, MD   7 months ago Hypertension associated with diabetes [facility])   [place] [facility] Merita Norton T, FNP   10 months ago Morbid obesity [facility])    [facility] Merita Norton T, FNP               spironolactone (ALDACTONE) 25 MG tablet 80 tablet 3    Sig: Take 1 tablet (25 mg total) by mouth daily.     Cardiovascular: Diuretics - Aldosterone Antagonist Failed - 12/03/2022 10:23 AM      Failed - Cr in normal range and within 180 days    Creatinine  Date Value Ref Range Status  05/12/2014 0.88 0.60 - 1.30 mg/dL Final   Creat  Date Value Ref Range Status  04/22/2014 0.79 0.50 - 1.10 mg/dL Final   Creatinine, Ser  Date Value Ref Range Status  01/26/2022 1.10 (H)  0.57 - 1.00 mg/dL Final         Failed - K in normal range and within 180 days    Potassium  Date Value Ref Range Status  01/26/2022 4.3 3.5 - 5.2 mmol/L Final  05/12/2014 3.0 (L) 3.5 - 5.1 mmol/L Final         Failed - Na in normal range and within 180 days    Sodium  Date Value Ref Range Status  01/26/2022 143 134 - 144 mmol/L Final  05/12/2014 144 136 - 145 mmol/L Final         Failed - eGFR is 30 or above and within 180 days    EGFR (African American)  Date Value Ref Range Status  10/21/2013 >60  Final   GFR calc Af Amer  Date Value Ref Range Status  02/04/2020 76 >59 mL/min/1.73 Final    Comment:    **[facility] currently reports eGFR in compliance with the current**   recommendations of the [employer]. [facility] will   update reporting as new guidelines are published from the NKF-ASN   Task force.    EGFR (Non-African Amer.)  Date Value Ref Range Status  10/21/2013 >60  Final    Comment:    eGFR values <20mL/min/1.73 m2 may be an indication of chronic kidney disease (CKD). Calculated eGFR is useful in patients with stable renal function. The eGFR calculation will not be reliable in acutely ill patients when serum creatinine is changing rapidly. It is not useful in  patients on dialysis. The eGFR calculation may not be applicable to patients at the low and high extremes of body sizes, pregnant women, and vegetarians.    GFR calc non Af Amer  Date Value Ref Range Status  02/04/2020 66 >59 mL/min/1.73 Final   eGFR  Date Value Ref Range Status  01/26/2022 60 >59 mL/min/1.73 Final         Passed - Last BP in normal range    BP Readings from Last 1 Encounters:  11/09/22 128/82         Passed - Valid encounter within last 6 months    Recent Outpatient Visits           1 month ago Traumatic brain injury with loss of consciousness,  sequela [facility])   [place] [facility] Mer[facility] Norton T, FNP   1 month ago Traumatic brain injury  with loss of consciousness, sequela [facility])   [place] [facility] Jacky Kindle, FNP   3 months ago Melasma   [place] [facility] Erasmo Downer, MD   7 months ago Hypertension associated with diabetes [facility])   [place] [facility] Jacky Kindle, FNP   10 months ago Morbid obesity [facility])   [facility] [facility] Jacky Kindle, [place]

## 2022-12-06 DIAGNOSIS — M47816 Spondylosis without myelopathy or radiculopathy, lumbar region: Secondary | ICD-10-CM | POA: Diagnosis not present

## 2022-12-06 DIAGNOSIS — M7918 Myalgia, other site: Secondary | ICD-10-CM | POA: Diagnosis not present

## 2022-12-20 DIAGNOSIS — R7309 Other abnormal glucose: Secondary | ICD-10-CM | POA: Diagnosis not present

## 2022-12-25 ENCOUNTER — Other Ambulatory Visit: Payer: Self-pay | Admitting: Family Medicine

## 2022-12-25 DIAGNOSIS — E782 Mixed hyperlipidemia: Secondary | ICD-10-CM

## 2022-12-25 DIAGNOSIS — L700 Acne vulgaris: Secondary | ICD-10-CM

## 2022-12-25 NOTE — Telephone Encounter (Signed)
Medication Refill - Medication: levothyroxine (SYNTHROID) 25 MCG tablet , atorvastatin (LIPITOR) 20 MG tablet , spironolactone (ALDACTONE) 25 MG tablet , ramelteon (ROZEREM) 8 MG tablet   Has the patient contacted their pharmacy? Yes.     Preferred Pharmacy (with phone number or street name):  [facility] [place] [address] Phone: [phone]  Fax: [phone]      Has the patient been seen for an appointment in the last year OR does the patient have an upcoming appointment? Yes.    Please assist patient further

## 2022-12-26 MED ORDER — ATORVASTATIN CALCIUM 20 MG PO TABS
20.0000 mg | ORAL_TABLET | Freq: Every day | ORAL | 1 refills | Status: DC
Start: 2022-12-26 — End: 2023-01-03

## 2022-12-26 MED ORDER — RAMELTEON 8 MG PO TABS: 8.0000 mg | ORAL_TABLET | Freq: Every day | ORAL | 1 refills | Status: DC

## 2022-12-26 MED ORDER — SPIRONOLACTONE 25 MG PO TABS
25.0000 mg | ORAL_TABLET | Freq: Every day | ORAL | 1 refills | Status: DC
Start: 2022-12-26 — End: 2023-01-03

## 2022-12-26 NOTE — Telephone Encounter (Signed)
Requested Prescriptions  Pending Prescriptions Disp Refills   atorvastatin (LIPITOR) 20 MG tablet 80 tablet 1    Sig: Take 1 tablet (20 mg total) by mouth daily.     Cardiovascular:  Antilipid - Statins Failed - 12/25/2022  5:33 PM      Failed - Lipid Panel in normal range within the last 12 months    Cholesterol, Total  Date Value Ref Range Status  01/26/2022 213 (H) 100 - 199 mg/dL Final   LDL Chol Calc (NIH)  Date Value Ref Range Status  01/26/2022 143 (H) 0 - 99 mg/dL Final   HDL  Date Value Ref Range Status  01/26/2022 50 >39 mg/dL Final   Triglycerides  Date Value Ref Range Status  01/26/2022 114 0 - 149 mg/dL Final         Passed - Patient is not pregnant      Passed - Valid encounter within last 12 months    Recent Outpatient Visits           1 month ago Traumatic brain injury with loss of consciousness, sequela [facility])   [place] [facility] Merita Norton T, FNP   2 months ago Traumatic brain injury with loss of consciousness, sequela [facility])   [place] [facility] Jacky Kindle, FNP   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     Cardiovascular: Diuretics - Aldosterone Antagonist Failed - 12/25/2022  5:33 PM      Failed - Cr in normal range and within 180 days    Creatinine  Date Value Ref Range Status  05/12/2014 0.88 0.60 - 1.30 mg/dL Final   Creat  Date Value Ref Range Status  04/22/2014 0.79 0.50 - 1.10 mg/dL Final   Creatinine, Ser  Date Value Ref Range Status  01/26/2022 1.10 (H) 0.57 - 1.00 mg/dL Final         Failed - K in normal range and within 180 days    Potassium  Date Value Ref Range Status  01/26/2022 4.3 3.5 - 5.2 mmol/L Final  05/12/2014 3.0 (L) 3.5 - 5.1 mmol/L Final         Failed - Na  in normal range and within 180 days    Sodium  Date Value Ref Range Status  01/26/2022 143 134 - 144 mmol/L Final  05/12/2014 144 136 - 145 mmol/L Final         Failed - eGFR is 30 or above and within 180 days    EGFR (African American)  Date Value Ref Range Status  10/21/2013 >60  Final   GFR calc Af Amer  Date Value Ref Range Status  02/04/2020 76 >59 mL/min/1.73 Final    Comment:    **[facility] currently reports eGFR in compliance with the current**   recommendations of the [employer]. [facility] will   update reporting as new guidelines are published from the NKF-ASN   Task force.    EGFR (Non-African Amer.)  Date Value Ref Range Status  10/21/2013 >60  Final    Comment:    eGFR values <83mL/min/1.73 m2 may be an indication of chronic kidney disease (CKD). Calculated eGFR is useful in patients with stable renal function. The eGFR calculation will not be reliable in acutely ill patients when serum creatinine is changing rapidly. It is not useful in  patients on dialysis. The eGFR calculation may not be applicable to patients at the low and high extremes of body sizes, pregnant women, and vegetarians.    GFR calc non Af Amer  Date Value Ref Range Status  02/04/2020 66 >59 mL/min/1.73 Final   eGFR  Date Value Ref Range Status  01/26/2022 60 >59 mL/min/1.73 Final         Passed - Last BP in normal range    BP Readings from Last 1 Encounters:  11/09/22 128/82         Passed - Valid encounter within last 6 months    Recent Outpatient Visits           1 month ago Traumatic brain injury with loss of consciousness, sequela [facility])   [place] [facility] Merita Norton T, FNP   2 months ago Traumatic brain injury with loss of consciousness, sequela [facility])   [facility] Mer[facility] Norton T, FNP   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

## 2022-12-26 NOTE — Telephone Encounter (Signed)
Requested medication (s) are due for refill today:   Requested medication (s) are on the active medication list: Yes  Last refill:  04/20/22  Future visit scheduled: Yes  Notes to clinic:  Historical provider.    Requested Prescriptions  Pending Prescriptions Disp Refills   levofloxacin (LEVAQUIN) 500 MG tablet       Off-Protocol Failed - 12/25/2022  5:33 PM      Failed - Medication not assigned to a protocol, review manually.      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     Cardiovascular:  Antilipid - Statins Failed - 12/25/2022  5:33 PM      Failed - Lipid Panel in normal range within the last 12 months    Cholesterol, Total  Date Value Ref Range Status  01/26/2022 213 (H) 100 - 199 mg/dL Final   LDL Chol Calc (NIH)  Date Value Ref Range Status  01/26/2022 143 (H) 0 - 99 mg/dL Final   HDL  Date Value Ref Range Status  01/26/2022 50 >39 mg/dL Final   Triglycerides  Date Value Ref Range Status  01/26/2022 114 0 - 149 mg/dL Final         Passed - Patient is not pregnant      Passed - Valid encounter within last 12 months    Recent Outpatient Visits           1 month ago Traumatic  brain injury with loss of consciousness, sequela [facility])   [place] [facility] Merita Norton T, FNP   2 months ago Traumatic brain injury with loss of consciousness, sequela [facility])   [place] [facility] Jacky Kindle, FNP   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     Cardiovascular: Diuretics - Aldosterone Antagonist Failed - 12/25/2022  5:33 PM      Failed - Cr in normal range and within 180 days    Creatinine  Date Value Ref Range Status  05/12/2014 0.88 0.60 - 1.30 mg/dL Final   Creat  Date Value Ref Range Status  04/22/2014 0.79 0.50 - 1.10 mg/dL Final   Creatinine, Ser  Date Value Ref  Range Status  01/26/2022 1.10 (H) 0.57 - 1.00 mg/dL Final         Failed - K in normal range and within 180 days    Potassium  Date Value Ref Range Status  01/26/2022 4.3 3.5 - 5.2 mmol/L Final  05/12/2014 3.0 (L) 3.5 - 5.1 mmol/L Final         Failed - Na in normal range and within 180 days    Sodium  Date Value Ref Range Status  01/26/2022 143 134 - 144 mmol/L Final  05/12/2014 144 136 - 145 mmol/L Final         Failed - eGFR is 30 or above and within 180 days    EGFR (African American)  Date Value Ref Range Status  10/21/2013 >60  Final   GFR calc Af Amer  Date Value Ref Range Status  02/04/2020 76 >59 mL/min/1.73 Final    Comment:    **[facility] currently reports eGFR in compliance with the current**   recommendations of the [employer]. [facility] will   update reporting as new guidelines are published from the NKF-ASN   Task force.    EGFR (Non-African Amer.)  Date Value Ref Range Status  10/21/2013 >60  Final    Comment:    eGFR values <65mL/min/1.73 m2 may be an indication of chronic kidney disease (CKD). Calculated eGFR is useful in patients with stable renal function. The eGFR calculation will not be reliable in acutely ill patients when serum creatinine is changing rapidly. It is not useful in  patients on dialysis. The eGFR calculation may not be applicable to patients at the low and high extremes of body sizes, pregnant women, and vegetarians.    GFR calc non Af Amer  Date Value Ref Range Status  02/04/2020 66 >59 mL/min/1.73 Final   eGFR  Date Value Ref Range Status  01/26/2022 60 >59 mL/min/1.73 Final         Passed - Last BP in normal range    BP Readings from Last 1 Encounters:  11/09/22 128/82         Passed - Valid encounter within last 6 months    Recent Outpatient Visits           1 month ago Traumatic brain injury with loss of consciousness, sequela (HCC)  [facility], FNP   2  months ago Traumatic brain injury with loss of consciousness, sequela [facility])   [place] [facility] Jacky Kindle, FNP   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

## 2022-12-28 DIAGNOSIS — M47816 Spondylosis without myelopathy or radiculopathy, lumbar region: Secondary | ICD-10-CM | POA: Diagnosis not present

## 2022-12-28 DIAGNOSIS — M47817 Spondylosis without myelopathy or radiculopathy, lumbosacral region: Secondary | ICD-10-CM | POA: Diagnosis not present

## 2022-12-28 DIAGNOSIS — R7309 Other abnormal glucose: Secondary | ICD-10-CM | POA: Diagnosis not present

## 2022-12-30 DIAGNOSIS — R7309 Other abnormal glucose: Secondary | ICD-10-CM | POA: Diagnosis not present

## 2023-01-02 ENCOUNTER — Other Ambulatory Visit: Payer: Self-pay | Admitting: Neurology

## 2023-01-02 ENCOUNTER — Telehealth: Payer: Self-pay

## 2023-01-02 ENCOUNTER — Other Ambulatory Visit: Payer: Self-pay | Admitting: Family Medicine

## 2023-01-02 DIAGNOSIS — E782 Mixed hyperlipidemia: Secondary | ICD-10-CM

## 2023-01-02 DIAGNOSIS — R42 Dizziness and giddiness: Secondary | ICD-10-CM

## 2023-01-02 DIAGNOSIS — L700 Acne vulgaris: Secondary | ICD-10-CM

## 2023-01-02 DIAGNOSIS — I152 Hypertension secondary to endocrine disorders: Secondary | ICD-10-CM

## 2023-01-02 DIAGNOSIS — E034 Atrophy of thyroid (acquired): Secondary | ICD-10-CM

## 2023-01-02 NOTE — Telephone Encounter (Signed)
Copied from CRM [id number]. Topic: General - Other >> Jan 02, 2023 11:06 AM Franchot Heidelberg wrote: Reason for CRM: [facility] called requesting to have all prescriptions [facility] to [facility]  Best contact: [phone]

## 2023-01-07 ENCOUNTER — Other Ambulatory Visit: Payer: Self-pay | Admitting: Family Medicine

## 2023-01-07 DIAGNOSIS — E559 Vitamin D deficiency, unspecified: Secondary | ICD-10-CM

## 2023-01-07 DIAGNOSIS — E034 Atrophy of thyroid (acquired): Secondary | ICD-10-CM

## 2023-01-07 DIAGNOSIS — E1159 Type 2 diabetes mellitus with other circulatory complications: Secondary | ICD-10-CM

## 2023-01-07 DIAGNOSIS — L700 Acne vulgaris: Secondary | ICD-10-CM

## 2023-01-07 DIAGNOSIS — E782 Mixed hyperlipidemia: Secondary | ICD-10-CM

## 2023-01-07 DIAGNOSIS — J0140 Acute pansinusitis, unspecified: Secondary | ICD-10-CM

## 2023-01-07 NOTE — Telephone Encounter (Signed)
Medication Refill - Medication:  fluticasone (FLONASE) 50 MCG/ACT nasal spray  Semaglutide, 1 MG/DOSE, (OZEMPIC, 1 MG/DOSE,) 4 MG/3ML SOPN  gabapentin (NEURONTIN) 300 MG capsule  Vitamin D, Ergocalciferol, (DRISDOL) 1.25 MG (50000 UNIT) CAPS capsule   Rxs below were sent to the wrong pharmacy on 6.6.24. Please resend to [facility].  atorvastatin (LIPITOR) 20 MG tablet  levothyroxine (SYNTHROID) 25 MCG tablet  lisinopril (ZESTRIL) 5 MG tablet  spironolactone (ALDACTONE) 25 MG tablet   Has the patient contacted their pharmacy? Yes.   (Agent: If no, request that the patient contact the pharmacy for the refill. If patient does not wish to contact the pharmacy document the reason why and proceed with request.) (Agent: If yes, when and what did the pharmacy advise?)  Preferred Pharmacy (with phone number or street name):  [facility] [place] [address] Phone: [phone]  Fax: [phone]     Has the patient been seen for an appointment in the last year OR does the patient have an upcoming appointment? Yes.    Agent: Please be advised that RX refills may take up to 3 business days. We ask that you follow-up with your pharmacy.

## 2023-01-08 MED ORDER — VITAMIN D (ERGOCALCIFEROL) 1.25 MG (50000 UNIT) PO CAPS
50000.0000 [IU] | ORAL_CAPSULE | ORAL | 0 refills | Status: DC
Start: 2023-01-08 — End: 2023-04-12

## 2023-01-08 MED ORDER — LISINOPRIL 5 MG PO TABS
5.0000 mg | ORAL_TABLET | Freq: Every day | ORAL | 0 refills | Status: DC
Start: 2023-01-08 — End: 2023-03-14

## 2023-01-08 MED ORDER — OZEMPIC (1 MG/DOSE) 4 MG/3ML ~~LOC~~ SOPN: PEN_INJECTOR | SUBCUTANEOUS | 0 refills | Status: DC

## 2023-01-08 MED ORDER — LEVOTHYROXINE SODIUM 25 MCG PO TABS
25.0000 ug | ORAL_TABLET | Freq: Every day | ORAL | 0 refills | Status: DC
Start: 2023-01-08 — End: 2023-04-12

## 2023-01-08 MED ORDER — ATORVASTATIN CALCIUM 20 MG PO TABS
20.0000 mg | ORAL_TABLET | Freq: Every day | ORAL | 0 refills | Status: DC
Start: 2023-01-08 — End: 2023-04-12

## 2023-01-08 MED ORDER — GABAPENTIN 300 MG PO CAPS: 300.0000 mg | ORAL_CAPSULE | Freq: Every day | ORAL | 0 refills | Status: DC

## 2023-01-08 MED ORDER — SPIRONOLACTONE 25 MG PO TABS
25.0000 mg | ORAL_TABLET | Freq: Every day | ORAL | 0 refills | Status: DC
Start: 2023-01-08 — End: 2023-04-12

## 2023-01-08 MED ORDER — FLUTICASONE PROPIONATE 50 MCG/ACT NA SUSP
NASAL | 0 refills | Status: DC
Start: 2023-01-08 — End: 2023-04-12

## 2023-01-08 NOTE — Telephone Encounter (Signed)
Requested Prescriptions  Pending Prescriptions Disp Refills   fluticasone (FLONASE) 50 MCG/ACT nasal spray 64 g 0    Sig: USE 2 SPRAYS IN BOTH NOSTRILS  DAILY     Ear, Nose, and Throat: Nasal Preparations - Corticosteroids Passed - 01/07/2023 11:42 AM      Passed - Valid encounter within last 12 months    Recent Outpatient Visits           2 months ago Traumatic brain injury with loss of consciousness, sequela (HCC)   [place] [facility] Norton T, FNP   2 months ago Traumatic brain [facility] with loss of consciousness, [facility])   [place] [facility] Merita Norton T, FNP   5 months ago Melasma   [place] [facility] [place], Marzella Schlein, MD   8 months ago Hypertension associated with diabetes [facility])   [place] [facility] Merita Norton T, FNP   11 months ago Morbid obesity [facility])   [facility] Norton T, FNP               Semaglutide, 1 MG/DOSE, (OZEMPIC, 1 MG/DOSE,) 4 MG/3ML SOPN 9 mL 0    Sig: INJECT 1MG  UNDER THE SKIN ONCE WEEKLY AS DIRECTED     Endocrinology:  Diabetes - GLP-1 Receptor Agonists - semaglutide Failed - 01/07/2023 11:42 AM      Failed - HBA1C in normal range and within 180 days    Hgb A1c MFr Bld  Date Value Ref Range Status  01/26/2022 6.5 (H) 4.8 - 5.6 % Final    Comment:             Prediabetes: 5.7 - 6.4          Diabetes: >6.4          Glycemic control for adults with diabetes: <7.0          Failed - Cr in normal range and within 360 days    Creatinine  Date Value Ref Range Status  05/12/2014 0.88 0.60 - 1.30 mg/dL Final   Creat  Date Value Ref Range Status  04/22/2014 0.79 0.50 - 1.10 mg/dL Final   Creatinine, Ser  Date Value Ref Range Status  01/26/2022 1.10 (H) 0.57 - 1.00 mg/dL Final         Passed - Valid encounter within last 6 months    Recent Outpatient Visits           2 months ago Traumatic brain injury with loss of  consciousness, sequela [facility])   [place] [facility] Merita Norton T, FNP   2 months ago Traumatic brain injury with loss of consciousness, sequela [facility])   [place] [facility] [place] Jacky Kindle, FNP   5 months ago Melasma   [facility] Erasmo Downer, MD   8 months ago Hypertension associated with diabetes [facility])    [facility] T, FNP   11 months ago Morbid obesity [facility])    [facility] Merita Norton T, FNP               gabapentin (NEURONTIN) 300 MG capsule 90 capsule 0    Sig: Take 1 capsule (300 mg total) by mouth at bedtime.     Neurology: Anticonvulsants - gabapentin Failed - 01/07/2023 11:42 AM      Failed - Cr in normal range and within 360 days  Creatinine  Date Value Ref Range Status  05/12/2014 0.88 0.60 - 1.30 mg/dL Final   Creat  Date Value Ref Range Status  04/22/2014 0.79 0.50 - 1.10 mg/dL Final   Creatinine, Ser  Date Value Ref Range Status  01/26/2022 1.10 (H) 0.57 - 1.00 mg/dL Final         Passed - Completed PHQ-2 or PHQ-9 in the last 360 days      Passed - Valid encounter within last 12 months    Recent Outpatient Visits           2 months ago Traumatic brain injury with loss of consciousness, sequela [facility])   [place] [facility] Mer[facility] Norton T, FNP   2 months ago Traumatic brain injury with loss of consciousness, sequela [facility])   [place] [facility] Jacky Kindle, FNP   5 months ago Melasma   [place] [facility] Erasmo Downer, MD   8 months ago Hypertension associated with diabetes [facility])   [place] [facility] Norton T, FNP   11 months ago Morbid obesity [facility])   [place] [facility] Mer[facility] Norton T, FNP               Vitamin D, Ergocalciferol, (DRISDOL) 1.25 MG (50000 UNIT) CAPS capsule 26 capsule 0    Sig:  Take 1 capsule (50,000 Units total) by mouth every 7 (seven) days. Take with meal.     Endocrinology:  Vitamins - Vitamin D Supplementation 2 Failed - 01/07/2023 11:42 AM      Failed - Manual Review: Route requests for 50,000 IU strength to the provider      Failed - Vitamin D in normal range and within 360 days    Vit D, 25-Hydroxy  Date Value Ref Range Status  01/26/2022 25.5 (L) 30.0 - 100.0 ng/mL Final    Comment:    Vitamin D deficiency has been defined by the Institute of Medicine and an Endocrine Society practice guideline as a level of serum 25-OH vitamin D less than 20 ng/mL (1,2). The Endocrine Society went on to further define vitamin D insufficiency as a level between 21 and 29 ng/mL (2). 1. IOM (Institute of Medicine). 2010. Dietary reference    intakes for calcium and D. [place]: The    [employer]. 2. Holick MF, Binkley Nunn, Bischoff-Ferrari HA, et al.    Evaluation, treatment, and prevention of vitamin D    deficiency: an Endocrine Society clinical practice    guideline. JCEM. 2011 Jul; 96(7):1911-30.          Passed - Ca in normal range and within 360 days    Calcium  Date Value Ref Range Status  01/26/2022 8.7 8.7 - 10.2 mg/dL Final   Calcium, Total  Date Value Ref Range Status  05/12/2014 8.1 (L) 8.5 - 10.1 mg/dL Final         Passed - Valid encounter within last 12 months    Recent Outpatient Visits           2 months ago Traumatic brain injury with loss of consciousness, sequela [facility])   [place] [facility] Mer[facility] Norton T, FNP   2 months ago Traumatic brain injury with loss of consciousness, sequela [facility])   [place] [facility] Jacky Kindle, FNP   5 months ago Melasma   [facility] Erasmo Downer, MD   8 months ago Hypertension  associated with diabetes [facility])   [place] [facility], FNP   11 months ago Morbid obesity [facility])   [facility] Mer[facility] Norton T, FNP               atorvastatin (LIPITOR) 20 MG tablet 100 tablet 0    Sig: Take 1 tablet (20 mg total) by mouth daily.     Cardiovascular:  Antilipid - Statins Failed - 01/07/2023 11:42 AM      Failed - Lipid Panel in normal range within the last 12 months    Cholesterol, Total  Date Value Ref Range Status  01/26/2022 213 (H) 100 - 199 mg/dL Final   LDL Chol Calc (NIH)  Date Value Ref Range Status  01/26/2022 143 (H) 0 - 99 mg/dL Final   HDL  Date Value Ref Range Status  01/26/2022 50 >39 mg/dL Final   Triglycerides  Date Value Ref Range Status  01/26/2022 114 0 - 149 mg/dL Final         Passed - Patient is not pregnant      Passed - Valid encounter within last 12 months    Recent Outpatient Visits           2 months ago Traumatic brain injury with loss of consciousness, sequela [facility])   [place] [facility] Mer[facility] Norton T, FNP   2 months ago Traumatic brain injury with loss of consciousness, sequela [facility])   [place] [facility] Jacky Kindle, FNP   5 months ago Melasma   [place] [facility] [place], Marzella Schlein, MD   8 months ago Hypertension associated with diabetes [facility])    [facility], FNP   11 months ago Morbid obesity [facility])   [place] [facility] Merita Norton T, FNP               levothyroxine (SYNTHROID) 25 MCG tablet 100 tablet 0    Sig: Take 1 tablet (25 mcg total) by mouth daily before breakfast.     Endocrinology:  Hypothyroid Agents Failed - 01/07/2023 11:42 AM      Failed - TSH in normal range and within 360 days    TSH  Date Value Ref Range Status  11/28/2021 1.410 0.450 - 4.500 uIU/mL Final         Passed - Valid encounter within last 12 months    Recent Outpatient Visits           2 months ago Traumatic brain injury with loss of consciousness, sequela [facility])   [place]  [facility], FNP   2 months ago Traumatic brain injury with loss of consciousness, sequela [facility])   [place] [facility] Jacky Kindle, FNP   5 months ago Melasma   [place] [facility] Erasmo Downer, MD   8 months ago Hypertension associated with diabetes [facility])   [place] [facility] Merita Norton T, FNP   11 months ago Morbid obesity [facility])   [place] [facility] Mer[facility] Norton T, FNP               lisinopril (ZESTRIL) 5 MG tablet 100 tablet 0    Sig: Take 1 tablet (5 mg total) by mouth daily.     Cardiovascular:  ACE Inhibitors Failed - 01/07/2023 11:42 AM      Failed - Cr in  normal range and within 180 days    Creatinine  Date Value Ref Range Status  05/12/2014 0.88 0.60 - 1.30 mg/dL Final   Creat  Date Value Ref Range Status  04/22/2014 0.79 0.50 - 1.10 mg/dL Final   Creatinine, Ser  Date Value Ref Range Status  01/26/2022 1.10 (H) 0.57 - 1.00 mg/dL Final         Failed - K in normal range and within 180 days    Potassium  Date Value Ref Range Status  01/26/2022 4.3 3.5 - 5.2 mmol/L Final  05/12/2014 3.0 (L) 3.5 - 5.1 mmol/L Final         Passed - Patient is not pregnant      Passed - Last BP in normal range    BP Readings from Last 1 Encounters:  11/09/22 128/82         Passed - Valid encounter within last 6 months    Recent Outpatient Visits           2 months ago Traumatic brain injury with loss of consciousness, sequela [facility])   [place] [facility] Norton T, FNP   2 months ago Traumatic brain injury with loss of consciousness, sequela [facility])   [place] [facility] Jacky Kindle, FNP   5 months ago Melasma   [place] [facility] Erasmo Downer, MD   8 months ago Hypertension associated with diabetes [facility])   [place] [facility] Mer[facility] Norton T, FNP   11 months ago  Morbid obesity [facility])   [facility] Merita Norton T, [place]               spironolactone (ALDACTONE) 25 MG tablet 100 tablet 0    Sig: Take 1 tablet (25 mg total) by mouth daily.     Cardiovascular: Diuretics - Aldosterone Antagonist Failed - 01/07/2023 11:42 AM      Failed - Cr in normal range and within 180 days    Creatinine  Date Value Ref Range Status  05/12/2014 0.88 0.60 - 1.30 mg/dL Final   Creat  Date Value Ref Range Status  04/22/2014 0.79 0.50 - 1.10 mg/dL Final   Creatinine, Ser  Date Value Ref Range Status  01/26/2022 1.10 (H) 0.57 - 1.00 mg/dL Final         Failed - K in normal range and within 180 days    Potassium  Date Value Ref Range Status  01/26/2022 4.3 3.5 - 5.2 mmol/L Final  05/12/2014 3.0 (L) 3.5 - 5.1 mmol/L Final         Failed - Na in normal range and within 180 days    Sodium  Date Value Ref Range Status  01/26/2022 143 134 - 144 mmol/L Final  05/12/2014 144 136 - 145 mmol/L Final         Failed - eGFR is 30 or above and within 180 days    EGFR (African American)  Date Value Ref Range Status  10/21/2013 >60  Final   GFR calc Af Amer  Date Value Ref Range Status  02/04/2020 76 >59 mL/min/1.73 Final    Comment:    **[facility] currently reports eGFR in compliance with the current**   recommendations of the [employer]. [facility] will   update reporting as new guidelines are published from the NKF-ASN   Task force.    EGFR (Non-African Amer.)  Date Value Ref Range Status  10/21/2013 >60  Final    Comment:    eGFR values <33mL/min/1.73 m2 may be an indication of chronic kidney disease (CKD). Calculated eGFR is useful in patients with stable renal function. The eGFR calculation will not be reliable in acutely ill patients when serum creatinine is changing rapidly. It is not useful in  patients on dialysis. The eGFR calculation may not be applicable to patients at the low and high extremes of body  sizes, pregnant women, and vegetarians.    GFR calc non Af Amer  Date Value Ref Range Status  02/04/2020 66 >59 mL/min/1.73 Final   eGFR  Date Value Ref Range Status  01/26/2022 60 >59 mL/min/1.73 Final         Passed - Last BP in normal range    BP Readings from Last 1 Encounters:  11/09/22 128/82         Passed - Valid encounter within last 6 months    Recent Outpatient Visits           2 months ago Traumatic brain injury with loss of consciousness, sequela [facility])   [place] [facility] Mer[facility] Norton T, FNP   2 months ago Traumatic brain injury with loss of consciousness, sequela [facility])   [place] [facility]y Kindle, FNP   5 months ago Melasma    [facility] Erasmo Downer, MD   8 months ago Hypertension associated with diabetes [facility])   [facility] Jack[facility] Kindle, FNP   11 months ago Morbid obesity [facility])   [facility] Jacky Kindle, [place]

## 2023-01-16 ENCOUNTER — Encounter: Payer: Self-pay | Admitting: Neurology

## 2023-01-18 ENCOUNTER — Ambulatory Visit
Admission: RE | Admit: 2023-01-18 | Discharge: 2023-01-18 | Disposition: A | Discharge status: Home / Self Care | Payer: Medicare PPO | Source: Ambulatory Visit | Attending: Neurology | Admitting: Neurology

## 2023-01-18 DIAGNOSIS — R519 Headache, unspecified: Secondary | ICD-10-CM | POA: Diagnosis not present

## 2023-01-18 DIAGNOSIS — R42 Dizziness and giddiness: Secondary | ICD-10-CM

## 2023-01-22 ENCOUNTER — Other Ambulatory Visit: Payer: Self-pay | Admitting: Family Medicine

## 2023-01-22 NOTE — Telephone Encounter (Signed)
Medication Refill - Medication: ramelteon (ROZEREM) 8 MG tablet [[id number]]   Has the patient contacted their pharmacy? Yes.     (Agent: If yes, when and what did the pharmacy advise?) Contact PCP   Preferred Pharmacy (with phone number or street name): [facility] [place] [address]   Has the patient been seen for an appointment in the last year OR does the patient have an upcoming appointment? Yes.    Agent: Please be advised that RX refills may take up to 3 business days. We ask that you follow-up with your pharmacy.

## 2023-01-23 NOTE — Telephone Encounter (Signed)
Unable to refill per protocol, Rx request is too soon. Last refill 12/26/22 for 90 and 1 refill.  Requested Prescriptions  Pending Prescriptions Disp Refills   ramelteon (ROZEREM) 8 MG tablet 90 tablet 1    Sig: Take 1 tablet (8 mg total) by mouth at bedtime.     Psychiatry: Anxiolytics/Hypnotics - Non-controlled Passed - 01/22/2023 11:04 AM      Passed - Valid encounter within last 12 months    Recent Outpatient Visits           2 months ago Traumatic brain injury with loss of consciousness, sequela [facility])   [place] [facility] T, FNP   3 months ago Traumatic brain injury with loss of consciousness, sequela [facility])   [place] [facility] Kindle, FNP   5 months ago Melasma   [place] [facility] Erasmo Downer, MD   9 months ago Hypertension associated with diabetes [facility])   [place] [facility] Jacky Kindle, FNP   12 months ago Morbid obesity [facility])   [facility] Jacky Kindle, [place]

## 2023-01-27 DIAGNOSIS — R7309 Other abnormal glucose: Secondary | ICD-10-CM | POA: Diagnosis not present

## 2023-01-30 ENCOUNTER — Other Ambulatory Visit: Payer: Self-pay | Admitting: Family Medicine

## 2023-01-30 DIAGNOSIS — R413 Other amnesia: Secondary | ICD-10-CM | POA: Diagnosis not present

## 2023-01-30 DIAGNOSIS — I10 Essential (primary) hypertension: Secondary | ICD-10-CM | POA: Diagnosis not present

## 2023-01-30 DIAGNOSIS — E7849 Other hyperlipidemia: Secondary | ICD-10-CM | POA: Diagnosis not present

## 2023-01-30 DIAGNOSIS — M4726 Other spondylosis with radiculopathy, lumbar region: Secondary | ICD-10-CM | POA: Diagnosis not present

## 2023-01-30 NOTE — Telephone Encounter (Signed)
Medication Refill - Medication: ramelteon (ROZEREM) 8 MG tablet   Has the patient contacted their pharmacy? yes (Agent: If no, request that the patient contact the pharmacy for the refill. If patient does not wish to contact the pharmacy document the reason why and proceed with request.) (Agent: If yes, when and what did the pharmacy advise?)pharmacy called directly in  Preferred Pharmacy (with phone number or street name):  [facility] [place] [address] Phone: [phone]  Fax: [phone]     Has the patient been seen for an appointment in the last year OR does the patient have an upcoming appointment? yes  Agent: Please be advised that RX refills may take up to 3 business days. We ask that you follow-up with your pharmacy.

## 2023-01-30 NOTE — Telephone Encounter (Signed)
Unable to refill per protocol, Rx request is too soon.  Requested Prescriptions  Pending Prescriptions Disp Refills   ramelteon (ROZEREM) 8 MG tablet 90 tablet 1    Sig: Take 1 tablet (8 mg total) by mouth at bedtime.     Psychiatry: Anxiolytics/Hypnotics - Non-controlled Passed - 01/30/2023 12:20 PM      Passed - Valid encounter within last 12 months    Recent Outpatient Visits           3 months ago Traumatic brain injury with loss of consciousness, sequela [facility])   [place] [facility] Merita Norton T, FNP   3 months ago Traumatic brain injury with loss of consciousness, sequela [facility])   [place] [facility] Jacky Kindle, FNP   5 months ago Melasma   [place] [facility] Erasmo Downer, MD   9 months ago Hypertension associated with diabetes [facility])   [place] [facility] Jacky Kindle, FNP   1 year ago Morbid [facility])   [place] [facility] Jacky Kindle, [place]

## 2023-02-01 DIAGNOSIS — E7849 Other hyperlipidemia: Secondary | ICD-10-CM | POA: Diagnosis not present

## 2023-02-01 DIAGNOSIS — M4726 Other spondylosis with radiculopathy, lumbar region: Secondary | ICD-10-CM | POA: Diagnosis not present

## 2023-02-01 DIAGNOSIS — R413 Other amnesia: Secondary | ICD-10-CM | POA: Diagnosis not present

## 2023-02-01 DIAGNOSIS — I10 Essential (primary) hypertension: Secondary | ICD-10-CM | POA: Diagnosis not present

## 2023-02-05 ENCOUNTER — Other Ambulatory Visit: Payer: Self-pay | Admitting: Family Medicine

## 2023-02-05 DIAGNOSIS — E034 Atrophy of thyroid (acquired): Secondary | ICD-10-CM

## 2023-02-05 NOTE — Telephone Encounter (Signed)
Unable to refill per protocol, Rx request is too soon. Last refill 01/08/23 for 100 days.  Requested Prescriptions  Pending Prescriptions Disp Refills   levothyroxine (SYNTHROID) 25 MCG tablet [Pharmacy Med Name: levothyroxine 25 mcg tablet] 100 tablet 1    Sig: TAKE ONE TABLET BY MOUTH DAILY AT 8AM BEFORE BREAKFAST     Endocrinology:  Hypothyroid Agents Failed - 02/05/2023  6:25 AM      Failed - TSH in normal range and within 360 days    TSH  Date Value Ref Range Status  11/28/2021 1.410 0.450 - 4.500 uIU/mL Final         Passed - Valid encounter within last 12 months    Recent Outpatient Visits           3 months ago Traumatic brain injury with loss of consciousness, sequela [facility])   [place] [facility] Merita Norton T, FNP   3 months ago Traumatic brain injury with loss of consciousness, sequela [facility])   [place] [facility] Jacky Kindle, FNP   6 months ago Melasma   [place] [facility] Erasmo Downer, MD   9 months ago Hypertension associated with diabetes [facility])   [place] [facility] Merita Norton T, FNP   1 year ago Morbid obesity [facility])   [facility] Jacky Kindle, [place]

## 2023-02-08 ENCOUNTER — Telehealth: Payer: Self-pay | Admitting: Family Medicine

## 2023-02-08 NOTE — Telephone Encounter (Signed)
[facility] pharmacy is requesting prior authorization Key: [id number] Name: Ventrice Ramelteon 8mg  tablets

## 2023-02-14 DIAGNOSIS — M7918 Myalgia, other site: Secondary | ICD-10-CM | POA: Diagnosis not present

## 2023-02-15 DIAGNOSIS — R7309 Other abnormal glucose: Secondary | ICD-10-CM | POA: Diagnosis not present

## 2023-02-26 DIAGNOSIS — G894 Chronic pain syndrome: Secondary | ICD-10-CM | POA: Diagnosis not present

## 2023-02-26 DIAGNOSIS — R7309 Other abnormal glucose: Secondary | ICD-10-CM | POA: Diagnosis not present

## 2023-02-27 ENCOUNTER — Telehealth: Payer: Self-pay | Admitting: Family Medicine

## 2023-02-27 NOTE — Telephone Encounter (Signed)
[facility] is requesting prior authorization Key: [id number] Name: Rennert Ramelteon 8 mg tablets

## 2023-02-27 NOTE — Telephone Encounter (Signed)
Debby pharmacy tech from [facility] stated she is sending PA for medication ramelteon (ROZEREM) 8 MG tablet as insurance would like for her to try alternatives. PA went to cover my meds key number: - [id number]   Debby mentioned someone from the office reached out and asked her to try a coupon, and the coupon information is not loading. Stated coupon may be a commercial coupon, but pt has [employer]. Stated just process PA or send a new Rx that insurance pays for.  Please advise.

## 2023-02-28 NOTE — Telephone Encounter (Signed)
Barbara Anderson (Key: [id number]) Your information has been sent to [facility].

## 2023-02-28 NOTE — Telephone Encounter (Signed)
Barbara Anderson (Key: [id number]) PA Case ID #: [id number] Rx #: [id number] Need Help? Call us at [phone] Outcome Denied today by [facility] NCPDP 2017 The drug you asked for is not listed in your preferred drug list (formulary). The preferred drug(s), you may not have tried, are: Belsomra tablet zolpidem tablet Your provider needs to give [place] medical reasons why the preferred drug(s) would not work for you and/or would have bad side effects. Sometimes a preferred drug needs more review for approval. Additionally, some preferred drugs listed may be the same drugs with different strengths or forms. [employer] may only require one strength or form of that drug to be tried. This decision was from [facility] Non-Formulary Exceptions Coverage Policy at [email or web address].

## 2023-03-03 DIAGNOSIS — I10 Essential (primary) hypertension: Secondary | ICD-10-CM | POA: Diagnosis not present

## 2023-03-03 DIAGNOSIS — M4726 Other spondylosis with radiculopathy, lumbar region: Secondary | ICD-10-CM | POA: Diagnosis not present

## 2023-03-03 DIAGNOSIS — R413 Other amnesia: Secondary | ICD-10-CM | POA: Diagnosis not present

## 2023-03-03 DIAGNOSIS — E7849 Other hyperlipidemia: Secondary | ICD-10-CM | POA: Diagnosis not present

## 2023-03-06 DIAGNOSIS — R7309 Other abnormal glucose: Secondary | ICD-10-CM | POA: Diagnosis not present

## 2023-03-11 ENCOUNTER — Telehealth: Payer: Self-pay | Admitting: Family Medicine

## 2023-03-11 NOTE — Telephone Encounter (Signed)
[facility] pharmacy is requesting prior authorization Key: [id number] Name: Barbara Anderson 8mg  tablets

## 2023-03-12 DIAGNOSIS — R7309 Other abnormal glucose: Secondary | ICD-10-CM | POA: Diagnosis not present

## 2023-03-12 NOTE — Telephone Encounter (Signed)
Information regarding your request There is an EOC that was clinically denied within the last 60 days. This request needs to be sent to the Grievance and [employer]. at [facility]. Appeal requests must come from the member or the provider.  Please see other encounter on the same medication.

## 2023-03-14 ENCOUNTER — Other Ambulatory Visit: Payer: Self-pay | Admitting: Family Medicine

## 2023-03-14 DIAGNOSIS — E1159 Type 2 diabetes mellitus with other circulatory complications: Secondary | ICD-10-CM

## 2023-03-14 NOTE — Telephone Encounter (Signed)
Medication Refill - Medication: metoprolol succinate (TOPROL-XL) 25 MG 24 hr tablet   Has the patient contacted their pharmacy? No.   Preferred Pharmacy (with phone number or street name):  [facility] [place] [address] Phone: [phone]  Fax: [phone]     Has the patient been seen for an appointment in the last year OR does the patient have an upcoming appointment? Yes.    Agent: Please be advised that RX refills may take up to 3 business days. We ask that you follow-up with your pharmacy.

## 2023-03-14 NOTE — Telephone Encounter (Signed)
Medication Refill - Medication:  lisinopril (ZESTRIL) 5 MG tablet [[id number]]   Has the patient contacted their pharmacy? Yes.   (Agent: If no, request that the patient contact the pharmacy for the refill. If patient does not wish to contact the pharmacy document the reason why and proceed with request.) (Agent: If yes, when and what did the pharmacy advise?)  Preferred Pharmacy (with phone number or street name[place] [facility] [place] [address] Phone: [phone]  Fax: [phone]   Has the patient been seen for an appointment in the last year OR does the patient have an upcoming appointment? Yes.    Agent: Please be advised that RX refills may take up to 3 business days. We ask that you follow-up with your pharmacy.

## 2023-03-15 MED ORDER — LISINOPRIL 5 MG PO TABS
5.0000 mg | ORAL_TABLET | Freq: Every day | ORAL | 0 refills | Status: DC
Start: 2023-03-15 — End: 2023-04-12

## 2023-03-15 MED ORDER — METOPROLOL SUCCINATE ER 25 MG PO TB24
25.0000 mg | ORAL_TABLET | Freq: Every day | ORAL | 0 refills | Status: DC
Start: 2023-03-15 — End: 2023-04-12

## 2023-03-15 NOTE — Telephone Encounter (Signed)
Requested Prescriptions  Pending Prescriptions Disp Refills   metoprolol succinate (TOPROL-XL) 25 MG 24 hr tablet 100 tablet 0    Sig: Take 1 tablet (25 mg total) by mouth daily.     Cardiovascular:  Beta Blockers Passed - 03/14/2023 11:32 AM      Passed - Last BP in normal range    BP Readings from Last 1 Encounters:  11/09/22 128/82         Passed - Last Heart Rate in normal range    Pulse Readings from Last 1 Encounters:  11/09/22 80         Passed - Valid encounter within last 6 months    Recent Outpatient Visits           4 months ago Traumatic brain injury with loss of consciousness, sequela [facility])   [place] [facility] Mer[facility] Norton T, FNP   4 months ago Traumatic brain injury with loss of consciousness, sequela [facility])   [facility] Jacky Kindle, FNP   7 months ago Melasma   [place] [facility] Erasmo Downer, MD   10 months ago Hypertension associated with diabetes [facility])    [facility] Jacky Kindle, FNP   1 year ago Morbid obesity [facility])   [facility] Jacky Kindle, [place]

## 2023-03-15 NOTE — Telephone Encounter (Signed)
Requested Prescriptions  Pending Prescriptions Disp Refills   lisinopril (ZESTRIL) 5 MG tablet 100 tablet 0    Sig: Take 1 tablet (5 mg total) by mouth daily.     Cardiovascular:  ACE Inhibitors Failed - 03/14/2023  2:41 PM      Failed - Cr in normal range and within 180 days    Creatinine  Date Value Ref Range Status  05/12/2014 0.88 0.60 - 1.30 mg/dL Final   Creat  Date Value Ref Range Status  04/22/2014 0.79 0.50 - 1.10 mg/dL Final   Creatinine, Ser  Date Value Ref Range Status  01/26/2022 1.10 (H) 0.57 - 1.00 mg/dL Final         Failed - K in normal range and within 180 days    Potassium  Date Value Ref Range Status  01/26/2022 4.3 3.5 - 5.2 mmol/L Final  05/12/2014 3.0 (L) 3.5 - 5.1 mmol/L Final         Passed - Patient is not pregnant      Passed - Last BP in normal range    BP Readings from Last 1 Encounters:  11/09/22 128/82         Passed - Valid encounter within last 6 months    Recent Outpatient Visits           4 months ago Traumatic brain injury with loss of consciousness, sequela [facility])   [place] [facility] Mer[facility] Norton T, FNP   4 months ago Traumatic brain injury with loss of consciousness, sequela [facility])   [place] [facility] Jacky Kindle, FNP   7 months ago Melasma   [facility] Erasmo Downer, MD   10 months ago Hypertension associated with diabetes [facility])    [facility] Merita Norton T, FNP   1 year ago Morbid obesity [facility])   [facility] Jacky Kindle, [place]

## 2023-03-18 ENCOUNTER — Other Ambulatory Visit: Payer: Self-pay | Admitting: Family Medicine

## 2023-03-28 DIAGNOSIS — R7309 Other abnormal glucose: Secondary | ICD-10-CM | POA: Diagnosis not present

## 2023-04-04 DIAGNOSIS — R413 Other amnesia: Secondary | ICD-10-CM | POA: Diagnosis not present

## 2023-04-10 ENCOUNTER — Telehealth: Payer: Self-pay | Admitting: Family Medicine

## 2023-04-10 DIAGNOSIS — R7309 Other abnormal glucose: Secondary | ICD-10-CM | POA: Diagnosis not present

## 2023-04-10 NOTE — Telephone Encounter (Signed)
Received a fax from covermymeds for Ozempic 4mg /78ml  Key: [id number]

## 2023-04-12 ENCOUNTER — Other Ambulatory Visit: Payer: Self-pay | Admitting: Family Medicine

## 2023-04-12 DIAGNOSIS — L700 Acne vulgaris: Secondary | ICD-10-CM

## 2023-04-12 DIAGNOSIS — E559 Vitamin D deficiency, unspecified: Secondary | ICD-10-CM

## 2023-04-12 DIAGNOSIS — E034 Atrophy of thyroid (acquired): Secondary | ICD-10-CM

## 2023-04-12 DIAGNOSIS — E782 Mixed hyperlipidemia: Secondary | ICD-10-CM

## 2023-04-12 DIAGNOSIS — E1159 Type 2 diabetes mellitus with other circulatory complications: Secondary | ICD-10-CM

## 2023-04-12 DIAGNOSIS — J0140 Acute pansinusitis, unspecified: Secondary | ICD-10-CM

## 2023-04-12 NOTE — Telephone Encounter (Signed)
Medication Refill - Medication:  gabapentin (NEURONTIN) 300 MG capsule metoprolol succinate (TOPROL-XL) 25 MG 24 hr tablet lisinopril (ZESTRIL) 5 MG tablet fluticasone (FLONASE) 50 MCG/ACT nasal spray spironolactone (ALDACTONE) 25 MG tablet atorvastatin (LIPITOR) 20 MG tablet Vitamin D, Ergocalciferol, (DRISDOL) 1.25 MG (50000 UNIT) CAPS capsule pregabalin (LYRICA) 200 MG capsule levothyroxine (SYNTHROID) 25 MCG tablet Semaglutide, 1 MG/DOSE, (OZEMPIC, 1 MG/DOSE,) 4 MG/3ML SOPN ramelteon (ROZEREM) 8 MG tablet  Pt is running low on all medications and is out of metoprolol.    Has the patient contacted their pharmacy? Yes.    Bella from Pharmacy is calling in for refill request.  Preferred Pharmacy (with phone number or street name):   [facility] - [place], [place] - [address]  Phone: [phone] Fax: [phone]  Has the patient been seen for an appointment in the last year OR does the patient have an upcoming appointment? Yes.    Agent: Please be advised that RX refills may take up to 3 business days. We ask that you follow-up with your pharmacy.

## 2023-04-12 NOTE — Telephone Encounter (Signed)
our request has been approved  PA Case: [id number], Status: Approved, Coverage Starts on: 07/30/2022 12:00:00 AM, Coverage Ends on: 07/30/2023 12:00:00 AM. Questions? Contact [phone]

## 2023-04-12 NOTE — Telephone Encounter (Signed)
PA initiated

## 2023-04-15 ENCOUNTER — Other Ambulatory Visit: Payer: Self-pay | Admitting: Family Medicine

## 2023-04-15 DIAGNOSIS — E034 Atrophy of thyroid (acquired): Secondary | ICD-10-CM

## 2023-04-15 MED ORDER — GABAPENTIN 300 MG PO CAPS: 300.0000 mg | ORAL_CAPSULE | Freq: Every day | ORAL | 3 refills | Status: DC

## 2023-04-15 MED ORDER — SPIRONOLACTONE 25 MG PO TABS
25.0000 mg | ORAL_TABLET | Freq: Every day | ORAL | 0 refills | Status: DC
Start: 2023-04-15 — End: 2023-07-12

## 2023-04-15 MED ORDER — VITAMIN D (ERGOCALCIFEROL) 1.25 MG (50000 UNIT) PO CAPS
50000.0000 [IU] | ORAL_CAPSULE | ORAL | 0 refills | Status: AC
Start: 2023-04-15 — End: ?

## 2023-04-15 MED ORDER — FLUTICASONE PROPIONATE 50 MCG/ACT NA SUSP: NASAL | 0 refills | Status: DC

## 2023-04-15 MED ORDER — LEVOTHYROXINE SODIUM 25 MCG PO TABS
25.0000 ug | ORAL_TABLET | Freq: Every day | ORAL | 0 refills | Status: DC
Start: 2023-04-15 — End: 2023-07-12

## 2023-04-15 MED ORDER — RAMELTEON 8 MG PO TABS: 8.0000 mg | ORAL_TABLET | Freq: Every day | ORAL | 0 refills | Status: DC

## 2023-04-15 MED ORDER — LISINOPRIL 5 MG PO TABS
5.0000 mg | ORAL_TABLET | Freq: Every day | ORAL | 0 refills | Status: DC
Start: 2023-04-15 — End: 2023-07-12

## 2023-04-15 MED ORDER — ATORVASTATIN CALCIUM 20 MG PO TABS
20.0000 mg | ORAL_TABLET | Freq: Every day | ORAL | 0 refills | Status: DC
Start: 2023-04-15 — End: 2023-07-12

## 2023-04-15 MED ORDER — PREGABALIN 200 MG PO CAPS: 200.0000 mg | ORAL_CAPSULE | Freq: Two times a day (BID) | ORAL | 3 refills | Status: DC

## 2023-04-15 MED ORDER — METOPROLOL SUCCINATE ER 25 MG PO TB24
25.0000 mg | ORAL_TABLET | Freq: Every day | ORAL | 0 refills | Status: DC
Start: 2023-04-15 — End: 2023-07-12

## 2023-04-15 MED ORDER — OZEMPIC (1 MG/DOSE) 4 MG/3ML ~~LOC~~ SOPN: PEN_INJECTOR | SUBCUTANEOUS | 0 refills | Status: DC

## 2023-04-15 NOTE — Telephone Encounter (Signed)
Requested Prescriptions  Pending Prescriptions Disp Refills   gabapentin (NEURONTIN) 300 MG capsule 90 capsule 0     Neurology: Anticonvulsants - gabapentin Failed - 04/12/2023 11:38 AM      Failed - Cr in normal range and within 360 days    Creatinine  Date Value Ref Range Status  05/12/2014 0.88 0.60 - 1.30 mg/dL Final   Creat  Date Value Ref Range Status  04/22/2014 0.79 0.50 - 1.10 mg/dL Final   Creatinine, Ser  Date Value Ref Range Status  01/26/2022 1.10 (H) 0.57 - 1.00 mg/dL Final         Passed - Completed PHQ-2 or PHQ-9 in the last 360 days      Passed - Valid encounter within last 12 months    Recent Outpatient Visits           5 months ago Traumatic brain injury with loss of consciousness, sequela [facility])   [place] [facility], FNP   5 months ago Traumatic brain injury with loss of consciousness, sequela [facility])   [place] [facility] Jacky Kindle, FNP   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     Cardiovascular:  Beta Blockers Passed - 04/12/2023 11:38 AM      Passed - Last BP in normal range    BP Readings from Last 1 Encounters:  11/09/22 128/82         Passed - Last Heart Rate in normal range    Pulse Readings from Last 1 Encounters:  11/09/22 80         Passed - Valid encounter within last 6 months    Recent Outpatient Visits           5 months ago Traumatic brain injury with loss of consciousness, sequela [facility])   [place] [facility] Mer[facility] Norton T, FNP   5 months ago Traumatic brain  injury with loss of consciousness, sequela [facility])   [place] [facility] Jacky Kindle, FNP   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 Mer[facility] Norton T, FNP   1 year ago [facility]bid [facility])    [facility] T, FNP               lisinopril (ZESTRIL) 5 MG tablet 100 tablet 0    Sig: Take 1 tablet (5 mg total) by mouth daily.     Cardiovascular:  ACE Inhibitors Failed - 04/12/2023 11:38 AM      Failed - Cr in normal range and within 180 days    Creatinine  Date Value Ref Range Status  05/12/2014 0.88 0.60 - 1.30 mg/dL Final   Creat  Date Value Ref Range Status  04/22/2014 0.79 0.50 - 1.10 mg/dL Final   Creatinine, Ser  Date Value Ref Range  Status  01/26/2022 1.10 (H) 0.57 - 1.00 mg/dL Final         Failed - K in normal range and within 180 days    Potassium  Date Value Ref Range Status  01/26/2022 4.3 3.5 - 5.2 mmol/L Final  05/12/2014 3.0 (L) 3.5 - 5.1 mmol/L Final         Passed - Patient is not pregnant      Passed - Last BP in normal range    BP Readings from Last 1 Encounters:  11/09/22 128/82         Passed - Valid encounter within last 6 months    Recent Outpatient Visits           5 months ago Traumatic brain injury with loss of consciousness, sequela [facility])   [place] [facility] Norton T, FNP   5 months ago Traumatic brain injury with loss of consciousness, sequela [facility])   [place] [facility] Jacky Kindle, FNP   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   [place] [facility] Erasmo Downer, MD   12 months ago Hypertension associated with diabetes [facility])   [place] [facility] Mer[facility] Norton T, FNP   1 year ago Morbid obesity [facility])   [place] [facility] Mer[facility] Norton T, FNP                fluticasone (FLONASE) 50 MCG/ACT nasal spray 64 g 0    Sig: USE 2 SPRAYS IN BOTH NOSTRILS  DAILY     Ear, Nose, and Throat: Nasal Preparations - Corticosteroids Passed - 04/12/2023 11:38 AM      Passed - Valid encounter within last 12 months    Recent Outpatient Visits           5 months ago Traumatic brain injury with loss of consciousness, sequela [facility])   [place] [facility] Merita Norton T, FNP   5 months ago Traumatic brain injury with loss of consciousness, sequela [facility])   [place] 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     Cardiovascular: Diuretics - Aldosterone Antagonist Failed - 04/12/2023 11:38 AM      Failed - Cr in normal range and within 180 days    Creatinine  Date Value Ref Range Status  05/12/2014 0.88 0.60 - 1.30 mg/dL Final   Creat  Date Value Ref Range Status  04/22/2014 0.79 0.50 - 1.10 mg/dL Final   Creatinine, Ser  Date Value Ref Range Status  01/26/2022 1.10 (H) 0.57 - 1.00 mg/dL Final         Failed - K in normal range and within 180 days    Potassium  Date Value Ref Range Status  01/26/2022 4.3 3.5 - 5.2 mmol/L Final  05/12/2014 3.0 (L) 3.5 - 5.1 mmol/L Final         Failed - Na in normal range and within 180 days  Sodium  Date Value Ref Range Status  01/26/2022 143 134 - 144 mmol/L Final  05/12/2014 144 136 - 145 mmol/L Final         Failed - eGFR is 30 or above and within 180 days    EGFR (African American)  Date Value Ref Range Status  10/21/2013 >60  Final    GFR calc Af Amer  Date Value Ref Range Status  02/04/2020 76 >59 mL/min/1.73 Final    Comment:    **[facility] currently reports eGFR in compliance with the current**   recommendations of the [employer]. [facility] will   update reporting as new guidelines are published from the NKF-ASN   Task force.    EGFR (Non-African Amer.)  Date Value Ref Range Status  10/21/2013 >60  Final    Comment:    eGFR values <39mL/min/1.73 m2 may be an indication of chronic kidney disease (CKD). Calculated eGFR is useful in patients with stable renal function. The eGFR calculation will not be reliable in acutely ill patients when serum creatinine is changing rapidly. It is not useful in  patients on dialysis. The eGFR calculation may not be applicable to patients at the low and high extremes of body sizes, pregnant women, and vegetarians.    GFR calc non Af Amer  Date Value Ref Range Status  02/04/2020 66 >59 mL/min/1.73 Final   eGFR  Date Value Ref Range Status  01/26/2022 60 >59 mL/min/1.73 Final         Passed - Last BP in normal range    BP Readings from Last 1 Encounters:  11/09/22 128/82         Passed - Valid encounter within last 6 months    Recent Outpatient Visits           5 months ago Traumatic brain injury with loss of consciousness, sequela [facility])   [place] [facility], FNP   5 months ago Traumatic brain injury with loss of consciousness, sequela [facility])   [place] [facility], FNP   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     Cardiovascular:  Antilipid - Statins Failed - 04/12/2023 11:38 AM      Failed - Lipid Panel in normal range within the last 12 months    Cholesterol, Total  Date Value Ref Range Status  01/26/2022 213 (H) 100 - 199 mg/dL Final   LDL Chol Calc (NIH)  Date Value Ref Range Status  01/26/2022 143 (H) 0 - 99 mg/dL Final   HDL  Date Value Ref Range Status  01/26/2022 50 >39 mg/dL Final   Triglycerides  Date Value Ref Range Status  01/26/2022 114 0 - 149 mg/dL Final         Passed - Patient is not pregnant      Passed - Valid encounter within last 12 months    Recent Outpatient Visits           5 months ago  Traumatic brain injury with loss of consciousness, sequela [facility])   [place] [facility] Merita Norton T, FNP   5 months ago Traumatic brain injury with loss of consciousness, sequela [facility])   [place] [facility] Jacky Kindle, FNP   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     Not Delegated - Neurology:  Anticonvulsants - Controlled - pregabalin Failed - 04/12/2023 11:38 AM      Failed - This refill cannot be delegated      Failed - Cr in normal range and within 360 days    Creatinine  Date Value Ref Range Status  05/12/2014 0.88 0.60 - 1.30 mg/dL Final   Creat  Date Value Ref Range Status  04/22/2014 0.79 0.50 - 1.10 mg/dL Final   Creatinine, Ser  Date Value Ref Range Status  01/26/2022 1.10 (H) 0.57 - 1.00 mg/dL Final         Passed - Completed PHQ-2 or PHQ-9 in the last 360 days      Passed - Valid encounter within last 12 months     Recent Outpatient Visits           5 months ago Traumatic brain injury with loss of consciousness, sequela [facility])   [place] [facility] T, FNP   5 months ago Traumatic brain injury with loss of consciousness, sequela [facility])   [place] [facility] Jacky Kindle, FNP   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     Endocrinology:  Hypothyroid Agents Failed - 04/12/2023 11:38 AM      Failed - TSH in normal range and within 360 days    TSH  Date Value Ref Range Status  11/28/2021 1.410 0.450 - 4.500 uIU/mL Final         Passed - Valid encounter within last 12 months    Recent Outpatient Visits           5 months ago Traumatic brain injury with loss of consciousness, sequela [facility])   [place] [facility] Merita Norton T, FNP   5 months ago Traumatic brain injury with loss of consciousness, sequela [facility])   [place] [facility] Jacky Kindle, FNP   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   [place] [facility] Erasmo Downer, MD   12 months ago Hypertension associated with diabetes [facility])   [place] [facility] Merita Norton T, FNP   1 year ago Morbid  obesity (HCC)   [place] [facility] Mer[facility] Norton T, FNP               Semaglutide, 1 MG/DOSE, (OZEMPIC, 1 MG/DOSE,) 4 MG/3ML SOPN 9 mL 0    Sig: INJECT 1MG  UNDER THE SKIN ONCE WEEKLY AS DIRECTED     Endocrinology:  Diabetes - GLP-1 Receptor Agonists - semaglutide Failed - 04/12/2023 11:38 AM      Failed -  HBA1C in normal range and within 180 days    Hgb A1c MFr Bld  Date Value Ref Range Status  01/26/2022 6.5 (H) 4.8 - 5.6 % Final    Comment:             Prediabetes: 5.7 - 6.4          Diabetes: >6.4          Glycemic control for adults with diabetes: <7.0          Failed - Cr in normal range and within 360 days    Creatinine  Date Value Ref Range Status  05/12/2014 0.88 0.60 - 1.30 mg/dL Final   Creat  Date Value Ref Range Status  04/22/2014 0.79 0.50 - 1.10 mg/dL Final   Creatinine, Ser  Date Value Ref Range Status  01/26/2022 1.10 (H) 0.57 - 1.00 mg/dL Final         Passed - Valid encounter within last 6 months    Recent Outpatient Visits           5 months ago Traumatic brain injury with loss of consciousness, sequela [facility])   [place] [facility] Merita Norton T, FNP   5 months ago Traumatic brain injury with loss of consciousness, sequela [facility])   [place] [facility] Jacky Kindle, FNP   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 Take with meal.     Endocrinology:  Vitamins - Vitamin D Supplementation 2 Failed - 04/12/2023 11:38 AM      Failed - Manual Review: Route requests for 50,000 IU strength to the provider      Failed - Ca in normal range and within 360 days    Calcium  Date Value Ref Range Status  01/26/2022 8.7 8.7 - 10.2 mg/dL Final   Calcium, Total  Date Value Ref Range Status  05/12/2014 8.1 (L) 8.5 - 10.1 mg/dL Final         Failed - Vitamin D in normal range and within 360 days     Vit D, 25-Hydroxy  Date Value Ref Range Status  01/26/2022 25.5 (L) 30.0 - 100.0 ng/mL Final    Comment:    Vitamin D deficiency has been defined by the Institute of Medicine and an Endocrine Society practice guideline as a level of serum 25-OH vitamin D less than 20 ng/mL (1,2). The Endocrine Society went on to further define vitamin D insufficiency as a level between 21 and 29 ng/mL (2). 1. IOM (Institute of  Medicine). 2010. Dietary reference    intakes for calcium and D. [place]: The    [employer]. 2. Holick MF, Binkley [place], Bischoff-Ferrari HA, et al.    Evaluation, treatment, and prevention of vitamin D    deficiency: an Endocrine Society clinical practice    guideline. JCEM. 2011 Jul; 96(7):1911-30.          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)   [place] [facility] Merita Norton T, FNP   1 year ago Morbid obesity [facility])   [place] [facility] Merita Norton T, FNP               ramelteon (ROZEREM) 8 MG tablet 90 tablet 1    Sig: Take 1 tablet (8 mg total) by mouth at bedtime.     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)   [place] [facility] Jacky Kindle, FNP   1 year ago Morbid obesity [facility])   [facility] Jacky Kindle, [place]

## 2023-04-15 NOTE — Telephone Encounter (Signed)
Requested medications are due for refill today.  Most are. Gabapentin, metoprolol, lisinopril requests are too soon.  Requested medications are on the active medications list.  Yes  Last refill. Varied  Future visit scheduled.   no  Notes to clinic.  Labs are expired. Lyrica is historical. Lyrica and Vit D 50000 are not delegated.    Requested Prescriptions  Pending Prescriptions Disp Refills   gabapentin (NEURONTIN) 300 MG capsule 90 capsule 0     Neurology: Anticonvulsants - gabapentin Failed - 04/12/2023 11:38 AM      Failed - Cr in normal range and within 360 days    Creatinine  Date Value Ref Range Status  05/12/2014 0.88 0.60 - 1.30 mg/dL Final   Creat  Date Value Ref Range Status  04/22/2014 0.79 0.50 - 1.10 mg/dL Final   Creatinine, Ser  Date Value Ref Range Status  01/26/2022 1.10 (H) 0.57 - 1.00 mg/dL Final         Passed - Completed PHQ-2 or PHQ-9 in the last 360 days      Passed - Valid encounter within last 12 months    Recent Outpatient Visits           5 months ago Traumatic brain injury with loss of consciousness, sequela [facility])   [place] [facility] Merita Norton T, FNP   5 months ago Traumatic brain injury with loss of consciousness, sequela [facility])   [place] [facility] Jacky Kindle, FNP   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     Cardiovascular:  Beta Blockers Passed - 04/12/2023 11:38 AM      Passed - Last BP in normal range    BP Readings from Last 1 Encounters:  11/09/22 128/82         Passed - Last Heart  Rate in normal range    Pulse Readings from Last 1 Encounters:  11/09/22 80         Passed - Valid encounter within last 6 months    Recent Outpatient Visits           5 months ago Traumatic brain injury with loss of consciousness, sequela [facility])   [place] [facility] Mer[facility] Norton T, FNP   5 months ago Traumatic brain injury with loss of consciousness, sequela [facility])   [place] [facility] Jacky Kindle, FNP   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     Cardiovascular:  ACE Inhibitors Failed - 04/12/2023  11:38 AM      Failed - Cr in normal range and within 180 days    Creatinine  Date Value Ref Range Status  05/12/2014 0.88 0.60 - 1.30 mg/dL Final   Creat  Date Value Ref Range Status  04/22/2014 0.79 0.50 - 1.10 mg/dL Final   Creatinine, Ser  Date Value Ref Range Status  01/26/2022 1.10 (H) 0.57 - 1.00 mg/dL Final         Failed - K in normal range and within 180 days    Potassium  Date Value Ref Range Status  01/26/2022 4.3 3.5 - 5.2 mmol/L Final  05/12/2014 3.0 (L) 3.5 - 5.1 mmol/L Final         Passed - Patient is not pregnant      Passed - Last BP in normal range    BP Readings from Last 1 Encounters:  11/09/22 128/82         Passed - Valid encounter within last 6 months    Recent Outpatient Visits           5 months ago Traumatic brain injury with loss of consciousness, sequela [facility])   [place] [facility] Merita Norton T, FNP   5 months ago Traumatic brain injury with loss of consciousness, sequela [facility])   [place]  [facility] Jacky Kindle, FNP   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     Cardiovascular: Diuretics - Aldosterone Antagonist Failed - 04/12/2023 11:38 AM      Failed - Cr in normal range and within 180 days    Creatinine  Date Value Ref Range Status  05/12/2014 0.88 0.60 - 1.30 mg/dL Final   Creat  Date Value Ref Range Status  04/22/2014 0.79 0.50 - 1.10 mg/dL Final   Creatinine, Ser  Date Value Ref Range Status  01/26/2022 1.10 (H) 0.57 - 1.00 mg/dL Final         Failed - K in normal range and within 180 days    Potassium  Date Value Ref Range Status  01/26/2022 4.3 3.5 - 5.2 mmol/L Final  05/12/2014 3.0 (L) 3.5 - 5.1 mmol/L Final         Failed - Na in normal range and within 180 days    Sodium  Date Value Ref Range Status  01/26/2022 143 134 - 144 mmol/L Final  05/12/2014 144 136 - 145 mmol/L Final         Failed - eGFR is 30 or above and within 180 days    EGFR (African American)  Date Value Ref Range Status  10/21/2013 >60  Final   GFR calc Af Amer  Date Value Ref Range Status  02/04/2020 76 >59 mL/min/1.73 Final    Comment:    **[facility] currently reports eGFR in compliance with the current**   recommendations of the [employer]. [facility] will   update reporting as new guidelines are published from the NKF-ASN   Task force.    EGFR (Non-African  Amer.)  Date Value Ref Range Status  10/21/2013 >60  Final    Comment:    eGFR values <80mL/min/1.73 m2 may be an indication of chronic kidney disease (CKD). Calculated eGFR is useful in patients  with stable renal function. The eGFR calculation will not be reliable in acutely ill patients when serum creatinine is changing rapidly. It is not useful in  patients on dialysis. The eGFR calculation may not be applicable to patients at the low and high extremes of body sizes, pregnant women, and vegetarians.    GFR calc non Af Amer  Date Value Ref Range Status  02/04/2020 66 >59 mL/min/1.73 Final   eGFR  Date Value Ref Range Status  01/26/2022 60 >59 mL/min/1.73 Final         Passed - Last BP in normal range    BP Readings from Last 1 Encounters:  11/09/22 128/82         Passed - Valid encounter within last 6 months    Recent Outpatient Visits           5 months ago Traumatic brain injury with loss of consciousness, sequela [facility])   [place] [facility] Merita Norton T, FNP   5 months ago Traumatic brain injury with loss of consciousness, sequela [facility])   [place] [facility] Jacky Kindle, FNP   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 Merita Norton T, FNP   1 year ago Morbid obesity [facility])   [place] [facility] Merita Norton T, FNP               atorvastatin (LIPITOR) 20 MG tablet 100 tablet 0    Sig: Take 1 tablet (20 mg total) by mouth daily.     Cardiovascular:  Antilipid - Statins Failed - 04/12/2023 11:38 AM      Failed - Lipid Panel in normal range within the last 12 months    Cholesterol, Total  Date Value Ref Range Status  01/26/2022 213 (H) 100 - 199 mg/dL Final   LDL Chol Calc (NIH)  Date Value Ref Range Status  01/26/2022 143 (H) 0 - 99 mg/dL Final   HDL  Date Value Ref Range Status  01/26/2022 50 >39 mg/dL Final   Triglycerides  Date Value Ref Range Status  01/26/2022 114 0 - 149 mg/dL Final         Passed - Patient is not pregnant       Passed - Valid encounter within last 12 months    Recent Outpatient Visits           5 months ago Traumatic brain injury with loss of consciousness, sequela [facility])   [place] [facility] Merita Norton T, FNP   5 months ago Traumatic brain injury with loss of consciousness, sequela [facility])   [place] [facility] Jacky Kindle, FNP   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     Not Delegated - Neurology:  Anticonvulsants - Controlled - pregabalin Failed - 04/12/2023 11:38 AM      Failed - This refill cannot be delegated      Failed - Cr in normal range and within 360 days    Creatinine  Date Value Ref Range Status  05/12/2014 0.88 0.60 - 1.30 mg/dL Final   Creat  Date Value Ref Range Status  04/22/2014 0.79 0.50 - 1.10 mg/dL Final   Creatinine, Ser  Date Value Ref Range Status  01/26/2022 1.10 (H) 0.57 - 1.00 mg/dL Final         Passed - Completed PHQ-2 or PHQ-9 in the last 360 days      Passed - Valid encounter within last 12 months    Recent Outpatient Visits           5 months ago Traumatic brain injury with loss of consciousness, sequela [facility])   [place] [facility] Merita Norton T, FNP   5 months ago Traumatic brain injury with loss of consciousness, sequela [facility])   [place] [facility] Jacky Kindle, FNP   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     Endocrinology:  Hypothyroid Agents Failed - 04/12/2023 11:38 AM      Failed - TSH in normal range and within 360 days    TSH  Date Value Ref Range Status  11/28/2021 1.410 0.450 - 4.500 uIU/mL Final         Passed - Valid encounter within last 12 months    Recent Outpatient Visits           5 months ago Traumatic brain injury with loss of consciousness, sequela [facility])   [place] [facility] Merita Norton T, FNP   5 months ago Traumatic brain injury with loss of consciousness, sequela [facility])   [place] [facility] Jacky Kindle, FNP   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   [place] [facility] Erasm[facility] Down[facility], MD   12 months ago Hypertension associated with diabetes [facility])   [place] [facility] Merita Norton T, FNP   1 year ago Morbid obesity [facility])   [place] [facility] Merita Norton T, FNP               Semaglutide, 1 MG/DOSE, (OZEMPIC, 1 MG/DOSE,) 4 MG/3ML SOPN 9 mL 0    Sig: INJECT 1MG  UNDER THE SKIN ONCE WEEKLY AS DIRECTED     Endocrinology:  Diabetes - GLP-1 Receptor Agonists - semaglutide Failed - 04/12/2023 11:38 AM      Failed - HBA1C in normal range and within 180 days    Hgb A1c MFr Bld  Date Value Ref Range Status  01/26/2022 6.5 (H) 4.8 - 5.6 % Final    Comment:             Prediabetes: 5.7 -  6.4          Diabetes: >6.4          Glycemic control for adults with diabetes: <7.0          Failed - Cr in normal range and within 360 days    Creatinine  Date Value Ref Range Status  05/12/2014 0.88 0.60 - 1.30 mg/dL Final   Creat  Date Value Ref Range Status  04/22/2014 0.79 0.50 - 1.10 mg/dL Final   Creatinine, Ser  Date Value Ref Range Status   01/26/2022 1.10 (H) 0.57 - 1.00 mg/dL Final         Passed - Valid encounter within last 6 months    Recent Outpatient Visits           5 months ago Traumatic brain injury with loss of consciousness, sequela [facility])   [place] [facility] Mer[facility] Norton T, FNP   5 months ago Traumatic brain injury with loss of consciousness, sequela [facility])   [place] [facility] Jacky Kindle, FNP   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 Take with meal.     Endocrinology:  Vitamins - Vitamin D Supplementation 2 Failed - 04/12/2023 11:38 AM      Failed - Manual Review: Route requests for 50,000 IU strength to the provider      Failed - Ca in normal range and within 360 days    Calcium  Date Value Ref Range Status  01/26/2022 8.7 8.7 - 10.2 mg/dL Final   Calcium, Total  Date Value Ref Range Status  05/12/2014 8.1 (L) 8.5 - 10.1 mg/dL Final         Failed - Vitamin D in normal range and within 360 days    Vit D, 25-Hydroxy  Date Value Ref Range Status  01/26/2022 25.5 (L) 30.0 - 100.0 ng/mL Final    Comment:    Vitamin D deficiency has been defined by the Institute of Medicine and an Endocrine Society practice guideline as a level of serum 25-OH vitamin D less than 20 ng/mL (1,2). The Endocrine Society went on to further define vitamin D insufficiency as a level between 21 and 29 ng/mL (2). 1. IOM (Institute of Medicine). 2010. Dietary reference    intakes for calcium and D. [place]: The    [employer]. 2. Holick MF, Binkley [place], Bischoff-Ferrari HA, et al.    Evaluation, treatment, and prevention of vitamin D    deficiency: an Endocrine Society clinical practice    guideline. JCEM. 2011 Jul; 96(7):1911-30.          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   [place] [facility] Erasmo Downer, MD   12 months ago Hypertension associated with diabetes [facility])   [place] [facility] Merita Norton T, FNP   1 year ago Morbid obesity [facility])   [place] [facility] Merita Norton T, FNP              Signed Prescriptions Disp Refills   fluticasone (FLONASE) 50 MCG/ACT nasal spray 64 g 0    Sig: USE 2 SPRAYS IN  BOTH NOSTRILS  DAILY     Ear, Nose, and Throat: Nasal Preparations - Corticosteroids Passed - 04/12/2023 11:38 AM      Passed - Valid encounter within last 12 months    Recent Outpatient Visits           5 months ago Traumatic brain injury with loss of consciousness, sequela [facility])   [place] [facility] Merita Norton T, FNP   5 months ago Traumatic brain injury with loss of consciousness, sequela [facility])   [place] 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

## 2023-04-27 DIAGNOSIS — R7309 Other abnormal glucose: Secondary | ICD-10-CM | POA: Diagnosis not present

## 2023-05-03 DIAGNOSIS — H2513 Age-related nuclear cataract, bilateral: Secondary | ICD-10-CM | POA: Diagnosis not present

## 2023-05-03 DIAGNOSIS — H52223 Regular astigmatism, bilateral: Secondary | ICD-10-CM | POA: Diagnosis not present

## 2023-05-03 DIAGNOSIS — H5213 Myopia, bilateral: Secondary | ICD-10-CM | POA: Diagnosis not present

## 2023-05-03 DIAGNOSIS — R7303 Prediabetes: Secondary | ICD-10-CM | POA: Diagnosis not present

## 2023-05-03 LAB — HM DIABETES EYE EXAM

## 2023-05-06 ENCOUNTER — Other Ambulatory Visit: Payer: Self-pay | Admitting: Family Medicine

## 2023-05-08 ENCOUNTER — Telehealth: Payer: Self-pay | Admitting: Family Medicine

## 2023-05-08 ENCOUNTER — Encounter: Payer: Self-pay | Admitting: Family Medicine

## 2023-05-08 NOTE — Telephone Encounter (Signed)
Recieved a fax from covermymeds for Ramelteon 8MG  Tablets /A prior authorization has been started   key: [facility]

## 2023-05-09 NOTE — Telephone Encounter (Signed)
Outcome Denied today by [facility] NCPDP 2017 The drug you asked for is not listed in your preferred drug list (formulary). The preferred drug(s), you may not have tried, are: Belsomra tablet zolpidem tablet Your provider needs to give [place] medical reasons why the preferred drug(s) would not work for you and/or would have bad side effects. Sometimes a preferred drug needs more review for approval. Additionally, some preferred drugs listed may be the same drugs with different strengths or forms. [employer] may only require one strength or form of that drug to be tried. This decision was from [facility] Non-Formulary Exceptions Coverage Policy at [email or web address] Ramelteon 8MG  tablets

## 2023-05-09 NOTE — Telephone Encounter (Signed)
Pa initiated

## 2023-05-13 DIAGNOSIS — Z9884 Bariatric surgery status: Secondary | ICD-10-CM | POA: Diagnosis not present

## 2023-05-13 DIAGNOSIS — K912 Postsurgical malabsorption, not elsewhere classified: Secondary | ICD-10-CM | POA: Diagnosis not present

## 2023-05-13 LAB — HEPATIC FUNCTION PANEL
ALT: 14 U/L (ref 7–35)
AST: 19 (ref 13–35)
Alkaline Phosphatase: 126 — AB (ref 25–125)
Bilirubin, Total: 0.4

## 2023-05-13 LAB — COMPREHENSIVE METABOLIC PANEL
Albumin: 3.8 (ref 3.5–5.0)
Calcium: 9 (ref 8.7–10.7)
Globulin: 2.1
eGFR: 72

## 2023-05-13 LAB — BASIC METABOLIC PANEL
BUN: 10 (ref 4–21)
CO2: 20 (ref 13–22)
Chloride: 108 (ref 99–108)
Creatinine: 1 (ref 0.5–1.1)
Glucose: 96
Sodium: 143 (ref 137–147)

## 2023-05-13 LAB — LIPID PANEL
Cholesterol: 193 (ref 0–200)
HDL: 44 (ref 35–70)
LDL Cholesterol: 131
Triglycerides: 98 (ref 40–160)

## 2023-05-13 LAB — HEMOGLOBIN A1C: Hemoglobin A1C: 5.5

## 2023-05-13 LAB — VITAMIN B12: Vitamin B-12: 299

## 2023-05-13 LAB — VITAMIN D 25 HYDROXY (VIT D DEFICIENCY, FRACTURES): Vit D, 25-Hydroxy: 64

## 2023-05-21 ENCOUNTER — Encounter: Payer: Self-pay | Admitting: Family Medicine

## 2023-05-21 ENCOUNTER — Ambulatory Visit (INDEPENDENT_AMBULATORY_CARE_PROVIDER_SITE_OTHER): Payer: Medicare PPO | Admitting: Family Medicine

## 2023-05-21 VITALS — BP 111/79 | HR 60 | Temp 98.1°F | Ht 67.0 in | Wt 170.6 lb

## 2023-05-21 DIAGNOSIS — Z9989 Dependence on other enabling machines and devices: Secondary | ICD-10-CM

## 2023-05-21 DIAGNOSIS — S069X9S Unspecified intracranial injury with loss of consciousness of unspecified duration, sequela: Secondary | ICD-10-CM | POA: Diagnosis not present

## 2023-05-21 DIAGNOSIS — I152 Hypertension secondary to endocrine disorders: Secondary | ICD-10-CM | POA: Diagnosis not present

## 2023-05-21 DIAGNOSIS — R399 Unspecified symptoms and signs involving the genitourinary system: Secondary | ICD-10-CM | POA: Insufficient documentation

## 2023-05-21 DIAGNOSIS — Z5320 Procedure and treatment not carried out because of patient's decision for unspecified reasons: Secondary | ICD-10-CM | POA: Diagnosis not present

## 2023-05-21 DIAGNOSIS — R809 Proteinuria, unspecified: Secondary | ICD-10-CM

## 2023-05-21 DIAGNOSIS — Z23 Encounter for immunization: Secondary | ICD-10-CM | POA: Diagnosis not present

## 2023-05-21 DIAGNOSIS — E1159 Type 2 diabetes mellitus with other circulatory complications: Secondary | ICD-10-CM

## 2023-05-21 DIAGNOSIS — E1129 Type 2 diabetes mellitus with other diabetic kidney complication: Secondary | ICD-10-CM

## 2023-05-21 MED ORDER — NITROFURANTOIN MONOHYD MACRO 100 MG PO CAPS: 100.0000 mg | ORAL_CAPSULE | Freq: Two times a day (BID) | ORAL | 0 refills | Status: DC

## 2023-05-21 NOTE — Patient Instructions (Signed)
Please call and schedule your mammogram:  [facility]  [address], [address] [facility] [place],  [place]  [address] Get Driving Directions Main: [phone]  Sunday:Closed Monday:7:20 AM - 5:00 PM Tuesday:7:20 AM - 5:00 PM Wednesday:7:20 AM - 5:00 PM Thursday:7:20 AM - 5:00 PM Friday:7:20 AM - 4:30 PM Saturday:Closed  

## 2023-05-21 NOTE — Progress Notes (Signed)
Established patient visit   Patient: Barbara Anderson   DOB: November 09, 1970   52 y.o. Female  MRN: [id number] Visit Date: 05/21/2023  Today's healthcare provider: Jacky Kindle, FNP  Introduced to nurse practitioner role and practice setting.  All questions answered.  Discussed provider/patient relationship and expectations.  Subjective    Urinary Tract Infection    HPI     Urinary Tract Infection    Additional comments: Smell and cloudy.  No pain yet      Last edited by Barrie Lyme on 05/21/2023 10:39 AM.      Ms Barbara Anderson, with a 7-day history of UTI symptoms, presents with reports of cloudy urine with a 'funky smell,' but denies dysuria, hematuria, urinary frequency, and systemic symptoms such as fever or chills. She has not tried any over-the-counter remedies such as cranberry juice or Azo.  In addition to the urinary symptoms, the patient is due for a mammogram and colon cancer screening. She has not yet scheduled the mammogram and is unsure about the method of colon cancer screening. She has a history of depression, which she believes is currently under control.  Medications: Outpatient Medications Prior to Visit  Medication Sig   Adapalene (DIFFERIN) 0.3 % gel Apply 1 application topically at bedtime.   Alcohol Swabs (B-D SINGLE USE SWABS REGULAR) PADS USE TO CLEANSE SKIN EVERY DAY BEFORE CHECKING BLOOD SUGAR   ALPRAZolam (XANAX) 1 MG tablet Take 1 mg by mouth 2 (two) times daily as needed.    amphetamine-dextroamphetamine (ADDERALL) 20 MG tablet Take 1 tablet (20 mg total) by mouth in the morning, at noon, and at bedtime. Has not started yet 11/10/19   ARIPiprazole (ABILIFY) 5 MG tablet Take 5 mg by mouth daily.   atorvastatin (LIPITOR) 20 MG tablet Take 1 tablet (20 mg total) by mouth daily.   Cholecalciferol (VITAMIN D3) 25 MCG (1000 UT) CAPS Take 1 capsule (1,000 Units total) by mouth daily.   DULoxetine (CYMBALTA) 60 MG capsule Take 2 capsules (120 mg total) by mouth  daily.   ferrous sulfate 325 (65 FE) MG tablet Take by mouth.   fluticasone (FLONASE) 50 MCG/ACT nasal spray USE 2 SPRAYS IN BOTH NOSTRILS  DAILY   folic acid (FOLVITE) 800 MCG tablet Take 1 tablet (800 mcg total) by mouth daily.   gabapentin (NEURONTIN) 300 MG capsule Take 1 capsule (300 mg total) by mouth at bedtime.   hydroquinone 2 % cream Apply topically at bedtime.   Lancets Misc. (ACCU-CHEK FASTCLIX LANCET) KIT To check blood sugar once daily   levofloxacin (LEVAQUIN) 500 MG tablet    levothyroxine (SYNTHROID) 25 MCG tablet Take 1 tablet (25 mcg total) by mouth daily before breakfast.   lisinopril (ZESTRIL) 5 MG tablet Take 1 tablet (5 mg total) by mouth daily.   metoCLOPramide (REGLAN) 10 MG tablet Take by mouth.   metoprolol succinate (TOPROL-XL) 25 MG 24 hr tablet Take 1 tablet (25 mg total) by mouth daily.   pregabalin (LYRICA) 200 MG capsule Take 1 capsule (200 mg total) by mouth 2 (two) times daily.   QUEtiapine Fumarate (SEROQUEL XR) 150 MG 24 hr tablet Take 150 mg by mouth at bedtime.    ramelteon (ROZEREM) 8 MG tablet TAKE ONE TABLET BY MOUTH EVERY DAY AT BEDTIME   spironolactone (ALDACTONE) 25 MG tablet Take 1 tablet (25 mg total) by mouth daily.   tiZANidine (ZANAFLEX) 2 MG tablet Take 1 tablet (2 mg total) by mouth 3 (three) times daily as needed  for muscle spasms.   topiramate (TOPAMAX) 200 MG tablet Take 1 tablet (200 mg total) by mouth at bedtime.   traZODone (DESYREL) 150 MG tablet Take 1 tablet (150 mg total) by mouth at bedtime.   Vitamin D, Ergocalciferol, (DRISDOL) 1.25 MG (50000 UNIT) CAPS capsule Take 1 capsule (50,000 Units total) by mouth every 7 (seven) days. Take with meal.   VRAYLAR 1.5 MG capsule Take 1.5 mg by mouth daily.   Semaglutide, 1 MG/DOSE, (OZEMPIC, 1 MG/DOSE,) 4 MG/3ML SOPN INJECT 1MG  UNDER THE SKIN ONCE WEEKLY AS DIRECTED (Patient not taking: Reported on 05/21/2023)   No facility-administered medications prior to visit.     Objective    BP  111/79 (BP Location: Left Arm, Patient Position: Sitting, Cuff Size: Normal)   Pulse 60   Temp 98.1 F (36.7 C) (Oral)   Ht 5\' 7"  (1.702 m)   Wt 170 lb 9.6 oz (77.4 kg)   SpO2 100%   BMI 26.72 kg/m   Physical Exam Vitals and nursing note reviewed.  Constitutional:      General: She is not in acute distress.    Appearance: Normal appearance. She is overweight. She is not ill-appearing, toxic-appearing or diaphoretic.  HENT:     Head: Normocephalic and atraumatic.  Cardiovascular:     Rate and Rhythm: Normal rate and regular rhythm.     Pulses: Normal pulses.     Heart sounds: Normal heart sounds. No murmur heard.    No friction rub. No gallop.  Pulmonary:     Effort: Pulmonary effort is normal. No respiratory distress.     Breath sounds: Normal breath sounds. No stridor. No wheezing, rhonchi or rales.  Chest:     Chest wall: No tenderness.  Abdominal:     General: Bowel sounds are normal. There is no distension.     Palpations: Abdomen is soft.     Tenderness: There is no abdominal tenderness. There is no right CVA tenderness, left CVA tenderness or guarding.  Musculoskeletal:        General: No swelling, tenderness, deformity or signs of injury. Normal range of motion.     Right lower leg: No edema.     Left lower leg: No edema.  Skin:    General: Skin is warm and dry.     Capillary Refill: Capillary refill takes less than 2 seconds.     Coloration: Skin is not jaundiced or pale.     Findings: No bruising, erythema, lesion or rash.  Neurological:     Mental Status: She is alert and oriented to person, place, and time.     Cranial Nerves: No cranial nerve deficit.     Sensory: No sensory deficit.     Motor: No weakness.     Coordination: Coordination normal.     Comments: Use of cane; +hx of falls, no injuries   Psychiatric:        Mood and Affect: Mood normal.        Behavior: Behavior normal.        Thought Content: Thought content normal.        Judgment: Judgment  normal.     No results found for any visits on 05/21/23.  Assessment & Plan    Urinary Tract Infection (UTI) Cloudy urine with foul smell for a week, worsening. No dysuria, frequency, hematuria, fever, or chills. No over-the-counter treatments tried. -Collect urine sample for microalbumin and culture. -Start Macrobid twice daily for three days.  Depression Screening due  today. Mood possibly affected by current infection. -Monitor mood and reassess after resolution of UTI.  General Health Maintenance -Administer influenza vaccine today. -Encourage patient to schedule mammogram (order sent in January). -Labs previously completed with [employer] [facility]; will abstract given recent completion. -Discuss colon cancer screening options. Patient due for follow-up colonoscopy with Dr. Wonda Amis based on previous findings. Consider at-home stool DNA test if patient is unable to undergo colonoscopy.  Leilani Merl, FNP, have reviewed all documentation for this visit. The documentation on 05/21/23 for the exam, diagnosis, procedures, and orders are all accurate and complete.  Jacky Kindle, FNP  [facility] [phone] (phone) [phone] (fax)  [facility]

## 2023-05-22 DIAGNOSIS — R7309 Other abnormal glucose: Secondary | ICD-10-CM | POA: Diagnosis not present

## 2023-05-22 LAB — MICROALBUMIN / CREATININE URINE RATIO
Creatinine, Urine: 51.4 mg/dL
Microalb/Creat Ratio: <6 mg/g{creat} (ref 0–29)
Microalbumin, Urine: <3 ug/mL

## 2023-05-22 LAB — URINALYSIS, ROUTINE W REFLEX MICROSCOPIC
Bilirubin, UA: NEGATIVE
Glucose, UA: NEGATIVE
Ketones, UA: NEGATIVE
Leukocytes,UA: NEGATIVE
Nitrite, UA: NEGATIVE
Protein,UA: NEGATIVE
RBC, UA: NEGATIVE
Specific Gravity, UA: 1.005 (ref 1.005–1.030)
Urobilinogen, Ur: 0.2 mg/dL (ref 0.2–1.0)
pH, UA: 5.5 (ref 5.0–7.5)

## 2023-05-22 NOTE — Progress Notes (Signed)
Advise patient; negative UA with NO reflex. Can stop ABX given no signs of infection. Recommend secondary consult if symptoms return.

## 2023-05-27 DIAGNOSIS — R7309 Other abnormal glucose: Secondary | ICD-10-CM | POA: Diagnosis not present

## 2023-05-27 DIAGNOSIS — E039 Hypothyroidism, unspecified: Secondary | ICD-10-CM | POA: Diagnosis not present

## 2023-05-27 DIAGNOSIS — Z9884 Bariatric surgery status: Secondary | ICD-10-CM | POA: Diagnosis not present

## 2023-06-05 DIAGNOSIS — E7849 Other hyperlipidemia: Secondary | ICD-10-CM | POA: Diagnosis not present

## 2023-06-05 DIAGNOSIS — M4726 Other spondylosis with radiculopathy, lumbar region: Secondary | ICD-10-CM | POA: Diagnosis not present

## 2023-06-05 DIAGNOSIS — F5104 Psychophysiologic insomnia: Secondary | ICD-10-CM | POA: Diagnosis not present

## 2023-06-05 DIAGNOSIS — R413 Other amnesia: Secondary | ICD-10-CM | POA: Diagnosis not present

## 2023-06-05 DIAGNOSIS — I10 Essential (primary) hypertension: Secondary | ICD-10-CM | POA: Diagnosis not present

## 2023-06-10 ENCOUNTER — Ambulatory Visit: Payer: Medicare PPO

## 2023-06-14 NOTE — Progress Notes (Unsigned)
GYNECOLOGY: ANNUAL EXAM   Subjective:    PCP: Jacky Kindle, FNP Barbara Anderson is a 52 y.o. female G3P0030 who presents for annual wellness visit. Periods are normally once a month, she reports she missed September period.  Has some pain with intercourse and spotting afterwards.   Well Woman Visit:  GYN HISTORY:  Patient's last menstrual period was 06/09/2023.     Menstrual History: OB History     Gravida  3   Para  0   Term      Preterm      AB  3   Living  0      SAB  3   IAB      Ectopic      Multiple      Live Births  0           Menarche age: 61 Patient's last menstrual period was 06/09/2023. Period Cycle (Days): 30 Period Duration (Days): 3 Period Pattern: Regular Menstrual Flow: Light, Moderate Menstrual Control: Tampon Dysmenorrhea: (!) Moderate Dysmenorrhea Symptoms: Cramping Cyclic symptoms include: anxiety, headache, and pelvic pain.  Intermenstrual bleeding, spotting, or discharge? After sex Urinary incontinence? A little bit  Sexually active: Yes  Number of sexual partners: 1  Gender of sexual Partners: male  Social History   Substance and Sexual Activity  Sexual Activity Yes   Partners: Male   Birth control/protection: None   Contraceptive methods: no method Dyspareunia? Yes  STI history: NO  STI/HIV testing or immunizations needed? Yes.     Health Maintenance: -Last pap: was normal 07/08/19 NILM HPV NEGATIVE --> Any abnormals: Yes pap / Never had a colpo -Last mammogram: 03/27/21-normal next scheduled 06/19/23 --> Any abnormals? NO  -Last colon cancer screen: 12/11/19 / Type: colonoscopy  -Last DEXA scan: no  -FMH of Breast / Colon / Cervical cancer: NO  -Vaccines:  Immunization History  Administered Date(s) Administered   Influenza, Seasonal, Injecte, Preservative Fre 05/21/2023   Influenza,inj,Quad PF,6+ Mos 05/09/2017, 05/21/2018, 05/12/2021, 04/20/2022   PNEUMOCOCCAL CONJUGATE-20 07/26/2021   Pneumococcal  Polysaccharide-23 07/21/2018   Tdap 06/17/2023   Zoster Recombinant(Shingrix) 07/26/2021, 10/26/2021   Last Tdap: Today / Flu: utd / COVID: declined / Gardasil: aged out / Shingles (50+): 09/29/21 / PCV20: none -Hep C screen: completed -Last lipid / glucose screening: 01/26/22  > Exercise: bicycling, moderately active > Dietary Supplements: Folate: Yes;  Calcium: No; Vitamin D: Yes > Body mass index is 26.47 kg/m.  > Recent dental visit Yes.   > Seat Belt Use: Yes.   > Texting and driving? No. > Guns in the house No. > Recreational or other drug use: denied.   Social History   Tobacco Use   Smoking status: Never   Smokeless tobacco: Never  Substance Use Topics   Alcohol use: No    Alcohol/week: 0.0 standard drinks of alcohol   Occupation: N/A    Lives with: self    PHQ-2 Score: In last two weeks, how often have you felt: Little interest or pleasure in doing things: Not at all (0) Feeling down, depressed or hopeless: Not at all (0) Score: 0  GAD-2 Over the last 2 weeks, how often have you been bothered by the following problems? Feeling nervous, anxious or on edge: Several days (+1) Not being able to stop or control worrying: Not at all (0)} Score: 1 _________________________________________________________  Current Outpatient Medications  Medication Sig Dispense Refill   Adapalene (DIFFERIN) 0.3 % gel Apply 1 application topically at bedtime.  45 g 3   Alcohol Swabs (B-D SINGLE USE SWABS REGULAR) PADS USE TO CLEANSE SKIN EVERY DAY BEFORE CHECKING BLOOD SUGAR 100 each 1   ALPRAZolam (XANAX) 1 MG tablet Take 1 mg by mouth 2 (two) times daily as needed.      amphetamine-dextroamphetamine (ADDERALL) 20 MG tablet Take 1 tablet (20 mg total) by mouth in the morning, at noon, and at bedtime. Has not started yet 11/10/19     ARIPiprazole (ABILIFY) 5 MG tablet Take 5 mg by mouth daily.     atorvastatin (LIPITOR) 20 MG tablet Take 1 tablet (20 mg total) by mouth daily. 100 tablet 0    Cholecalciferol (VITAMIN D3) 25 MCG (1000 UT) CAPS Take 1 capsule (1,000 Units total) by mouth daily. 90 capsule 0   DULoxetine (CYMBALTA) 60 MG capsule Take 2 capsules (120 mg total) by mouth daily. 60 capsule 0   ferrous sulfate 325 (65 FE) MG tablet Take by mouth.     fluticasone (FLONASE) 50 MCG/ACT nasal spray USE 2 SPRAYS IN BOTH NOSTRILS  DAILY 64 g 0   folic acid (FOLVITE) 800 MCG tablet Take 1 tablet (800 mcg total) by mouth daily. 90 tablet 3   gabapentin (NEURONTIN) 300 MG capsule Take 1 capsule (300 mg total) by mouth at bedtime. 100 capsule 3   hydroquinone 2 % cream Apply topically at bedtime. 70 g 1   Lancets Misc. (ACCU-CHEK FASTCLIX LANCET) KIT To check blood sugar once daily 1 kit 0   levofloxacin (LEVAQUIN) 500 MG tablet      levothyroxine (SYNTHROID) 25 MCG tablet Take 1 tablet (25 mcg total) by mouth daily before breakfast. 100 tablet 0   lisinopril (ZESTRIL) 5 MG tablet Take 1 tablet (5 mg total) by mouth daily. 100 tablet 0   metoCLOPramide (REGLAN) 10 MG tablet Take by mouth.     metoprolol succinate (TOPROL-XL) 25 MG 24 hr tablet Take 1 tablet (25 mg total) by mouth daily. 100 tablet 0   nitrofurantoin, macrocrystal-monohydrate, (MACROBID) 100 MG capsule Take 1 capsule (100 mg total) by mouth 2 (two) times daily. 6 capsule 0   pregabalin (LYRICA) 200 MG capsule Take 1 capsule (200 mg total) by mouth 2 (two) times daily. 200 capsule 3   QUEtiapine Fumarate (SEROQUEL XR) 150 MG 24 hr tablet Take 150 mg by mouth at bedtime.      ramelteon (ROZEREM) 8 MG tablet TAKE ONE TABLET BY MOUTH EVERY DAY AT BEDTIME 90 tablet 0   Semaglutide, 1 MG/DOSE, (OZEMPIC, 1 MG/DOSE,) 4 MG/3ML SOPN INJECT 1MG  UNDER THE SKIN ONCE WEEKLY AS DIRECTED 9 mL 0   spironolactone (ALDACTONE) 25 MG tablet Take 1 tablet (25 mg total) by mouth daily. 100 tablet 0   tiZANidine (ZANAFLEX) 2 MG tablet Take 1 tablet (2 mg total) by mouth 3 (three) times daily as needed for muscle spasms. 30 tablet 1    topiramate (TOPAMAX) 200 MG tablet Take 1 tablet (200 mg total) by mouth at bedtime. 30 tablet 1   traZODone (DESYREL) 150 MG tablet Take 1 tablet (150 mg total) by mouth at bedtime. 30 tablet 1   Vitamin D, Ergocalciferol, (DRISDOL) 1.25 MG (50000 UNIT) CAPS capsule Take 1 capsule (50,000 Units total) by mouth every 7 (seven) days. Take with meal. 26 capsule 0   VRAYLAR 1.5 MG capsule Take 1.5 mg by mouth daily.     No current facility-administered medications for this visit.   Allergies  Allergen Reactions   Metformin And Related Diarrhea  Past Medical History:  Diagnosis Date   ADD (attention deficit disorder)    Anxiety    Back pain    Cholecystitis, acute 01/06/2019   Concussion 2001   head trauma-work related. Caused headaches and short term memory loss.   Depression    Diabetes mellitus without complication (HCC)    Overactive bladder    Recurrent vaginitis    Past Surgical History:  Procedure Laterality Date   ankle pin and plates placement     right   AUGMENTATION MAMMAPLASTY     BARIATRIC SURGERY  02/27/2022   CHOLECYSTECTOMY N/A 01/06/2019   Procedure: LAPAROSCOPIC CHOLECYSTECTOMY;  Surgeon: Ancil Linsey, MD;  Location: [facility] ORS;  Service: General;  Laterality: N/A;   COLONOSCOPY WITH PROPOFOL N/A 12/11/2019   Procedure: COLONOSCOPY WITH PROPOFOL;  Surgeon: Toney Reil, MD;  Location: [facility] ENDOSCOPY;  Service: Gastroenterology;  Laterality: N/A;   COSMETIC SURGERY  2005   BREASTS, ARM, ABDOMEN AFTER WEIGHT LOSS    Review Of Systems  Constitutional: Denied constitutional symptoms, night sweats, recent illness, fatigue, fever, insomnia and weight loss.  Eyes: Denied eye symptoms, eye pain, photophobia, vision change and visual disturbance.  Ears/Nose/Throat/Neck: Denied ear, nose, throat or neck symptoms, hearing loss, nasal discharge, sinus congestion and sore throat.  Cardiovascular: Denied cardiovascular symptoms, arrhythmia, chest pain/pressure,  edema, exercise intolerance, orthopnea and palpitations.  Respiratory: Denied pulmonary symptoms, asthma, pleuritic pain, productive sputum, cough, dyspnea and wheezing.  Gastrointestinal: Denied, gastro-esophageal reflux, melena, nausea and vomiting.  Genitourinary: Denied genitourinary symptoms including symptomatic vaginal discharge, pelvic relaxation issues, and urinary complaints.  Musculoskeletal: Denied musculoskeletal symptoms, stiffness, swelling, muscle weakness and myalgia.  Dermatologic: Denied dermatology symptoms, rash and scar.  Neurologic: Denied neurology symptoms, dizziness, headache, neck pain and syncope.  Psychiatric: Denied psychiatric symptoms, anxiety and depression.  Endocrine: Denied endocrine symptoms including hot flashes and night sweats.      Objective:    BP 94/64   Pulse 67   Ht 5\' 7"  (1.702 m)   Wt 169 lb (76.7 kg)   LMP 06/09/2023   BMI 26.47 kg/m   Constitutional: Well-developed, well-nourished female in no acute distress Neurological: Alert and oriented to person, place, and time Psychiatric: Mood and affect appropriate Skin: No rashes or lesions Neck: Supple without masses. Trachea is midline.Thyroid is normal size without masses Lymphatics: No cervical, axillary, supraclavicular, or inguinal adenopathy noted Respiratory: Clear to auscultation bilaterally. Good air movement with normal work of breathing. Cardiovascular: Regular rate and rhythm. Extremities grossly normal, nontender with no edema; pulses regular Gastrointestinal: Soft, nontender, nondistended. No masses or hernias appreciated. No hepatosplenomegaly. No fluid wave. No rebound or guarding. Breast Exam: normal appearance, no masses or tenderness, Inspection negative, No nipple retraction or dimpling, No nipple discharge or bleeding, No axillary or supraclavicular adenopathy, Normal to palpation without dominant masses Genitourinary:         External Genitalia: Normal female  genitalia    Vagina: Normal mucosa, no lesions.    Cervix: 1.5cm polyp at os; os normal size and consistency; no cervical motion tenderness; non-friable; Pap obtained.    Uterus: Normal size and contour; smooth, mobile, NT, retroverted. Adnexae: Non-palpable and non-tender Perineum/Anus: No lesions Rectal: deferred    Assessment/Plan:    Barbara Anderson is a 52 y.o. female G63P0030 with normal well-woman gynecologic exam.  -Screenings:  Pap: done with cotesting today Mammogram: PCP manages, upcoming Colon: UTD, PCP managed Labs: HIV, Hep C, FSH, Estradiol (for missed period) GAD/PHQ-2 = 1, discussed coping techniques;  follow up with PCP if worsens or develops concern -Contraception: none -Vaccines: Tdap today, discussed new PCV recommendation -Healthy lifestyle modifications discussed: multivitamin, diet, exercise, sunscreen, tobacco and alcohol use. Emphasized importance of regular physical activity.  -Calcium and Vit D recommendation reviewed.  -All questions answered to patient's satisfaction.  -RTC 1 yr for annual, sooner prn.   Return in about 1 week (around 06/24/2023) for removal of cervical polyp . Return in 1 year for annual exam. Sooner prn.     Julieanne Manson, DO [facility]

## 2023-06-17 ENCOUNTER — Other Ambulatory Visit (HOSPITAL_COMMUNITY)
Admission: RE | Admit: 2023-06-17 | Discharge: 2023-06-17 | Disposition: A | Discharge status: Home / Self Care | Payer: Medicare PPO | Source: Ambulatory Visit | Attending: Obstetrics | Admitting: Obstetrics

## 2023-06-17 ENCOUNTER — Encounter: Payer: Self-pay | Admitting: Obstetrics

## 2023-06-17 ENCOUNTER — Ambulatory Visit (INDEPENDENT_AMBULATORY_CARE_PROVIDER_SITE_OTHER): Payer: Medicare PPO | Admitting: Obstetrics

## 2023-06-17 VITALS — BP 94/64 | HR 67 | Ht 67.0 in | Wt 169.0 lb

## 2023-06-17 DIAGNOSIS — Z01419 Encounter for gynecological examination (general) (routine) without abnormal findings: Secondary | ICD-10-CM | POA: Diagnosis not present

## 2023-06-17 DIAGNOSIS — Z1151 Encounter for screening for human papillomavirus (HPV): Secondary | ICD-10-CM | POA: Insufficient documentation

## 2023-06-17 DIAGNOSIS — Z23 Encounter for immunization: Secondary | ICD-10-CM | POA: Diagnosis not present

## 2023-06-17 DIAGNOSIS — Z113 Encounter for screening for infections with a predominantly sexual mode of transmission: Secondary | ICD-10-CM | POA: Diagnosis not present

## 2023-06-17 DIAGNOSIS — N841 Polyp of cervix uteri: Secondary | ICD-10-CM

## 2023-06-17 DIAGNOSIS — Z124 Encounter for screening for malignant neoplasm of cervix: Secondary | ICD-10-CM | POA: Insufficient documentation

## 2023-06-17 DIAGNOSIS — N926 Irregular menstruation, unspecified: Secondary | ICD-10-CM | POA: Diagnosis not present

## 2023-06-17 NOTE — Patient Instructions (Signed)
Cervical Polyps  Cervical polyps are growths that develop in the area between the vagina and the uterus. This area is called the cervix. Cervical polyps are common, especially in women who are older than 40 years and have had children. Cervical polyps are usually pink or purple and about one-half inch in size. What are the causes? The cause of cervical polyps is usually unknown. Possible causes include: Long-term (chronic) inflammation or infection. Poor blood flow through cervical veins. High estrogen levels. What are the signs or symptoms? Cervical polyps do not usually cause any symptoms. They are often found during a routine pelvic exam. If you do have symptoms, they may include: Bleeding from the vagina. Bleeding may happen between menstrual periods or after sex. It may also happen after menopause, when you no longer have a menstrual period. Heavy bleeding during your menstrual period. White or yellow discharge from the vagina (leukorrhea). How is this diagnosed? Your health care provider can diagnose cervical polyps by looking at your cervix during a pelvic exam. How is this treated? Cervical polyps do not need to be treated. However, polyps that cause symptoms may be treated. Treatment includes: Removing the polyps using an instrument called a forceps. This is a painless procedure that does not require anesthesia. Using an electric current (electrocautery) or a chemical called silver nitrate to stop bleeding. After removal, polyps are tested to make sure they are not cancer. They are usually not cancer. They do not require treatment and rarely come back. Follow these instructions at home: Take over-the-counter and prescription medicines only as told by your health care provider. Keep all follow-up visits. This is important. Contact a health care provider if: You have vaginal bleeding between menstrual periods, after sex, or after menopause. You have heavy vaginal bleeding during your  menstrual period. You have white or yellow discharge from the vagina (leukorrhea). You have vaginal bleeding after a cervical polyp removal. Summary Cervical polyps are fingerlike growths that grow in the passage between your vagina and your uterus (cervix). The cause of cervical polyps is usually unknown. Cervical polyps do not usually cause symptoms. They are often found during a routine pelvic exam. Polyps that cause symptoms can be removed during an office visit. Cervical polyps are almost always benign but are checked for cancer after being removed. This information is not intended to replace advice given to you by your health care provider. Make sure you discuss any questions you have with your health care provider. Document Revised: 01/26/2020 Document Reviewed: 01/26/2020 Elsevier Patient Education  2024 [employer] Inc[employer]  

## 2023-06-18 LAB — HEP, RPR, HIV PANEL
HIV Screen 4th Generation wRfx: NONREACTIVE
Hepatitis B Surface Ag: NEGATIVE
RPR Ser Ql: NONREACTIVE

## 2023-06-18 LAB — ESTRADIOL: Estradiol: 66.9 pg/mL

## 2023-06-18 LAB — FOLLICLE STIMULATING HORMONE: FSH: 18.9 m[IU]/mL

## 2023-06-19 ENCOUNTER — Ambulatory Visit: Payer: Self-pay

## 2023-06-19 ENCOUNTER — Ambulatory Visit
Admission: RE | Admit: 2023-06-19 | Discharge: 2023-06-19 | Disposition: A | Discharge status: Home / Self Care | Payer: Medicare PPO | Source: Ambulatory Visit | Attending: Family Medicine | Admitting: Family Medicine

## 2023-06-19 DIAGNOSIS — Z1231 Encounter for screening mammogram for malignant neoplasm of breast: Secondary | ICD-10-CM | POA: Diagnosis not present

## 2023-06-19 DIAGNOSIS — R7309 Other abnormal glucose: Secondary | ICD-10-CM | POA: Diagnosis not present

## 2023-06-19 NOTE — Telephone Encounter (Signed)
Scheduled with dr.fisher Friday

## 2023-06-19 NOTE — Telephone Encounter (Signed)
  Chief Complaint: urinary sx Symptoms: urine dark cloudy, has odor slight pressure all in morning, gets better throughout day, CBGs elevated to 90-100 in AM Frequency: 1 month Pertinent Negatives: Patient denies urinary pain or burning Disposition: [] ED /[] Urgent Care (no appt availability in office) / [] Appointment(In office/virtual)/ []  [place] [facility]/ [] Home Care/ [] Refused Recommended Disposition /[] [place] [facility]/ [x]  Follow-up with PCP Additional Notes: pt is wanting to be seen for above sx. Pt states that she would like to bring urine sample into office and see Robynn Pane. No appt until 07/02/23. Offered appt with Denny Peon, PA at [facility] but pt declined. Pt would like to see Robynn Pane, NP. Advised pt I would send message to provider and see if they can give suggestions.    Summary: cloudiness in urine - higher glucose levels   Pt complaining of smell and cloudiness in urine. Pt states its usually the first urine in the morning. Pt also states her glucose levels are higher than normal, this morning they were at 79 but in the past have gone up between 90 and 100.  Pt also saw OBGYN yesterday and was informed she needed a shingles vaccine & possible pneumovax.  Pt seeking clinical advise.     Reason for Disposition  Bad or foul-smelling urine  Answer Assessment - Initial Assessment Questions 1. SYMPTOM: "What's the main symptom you're concerned about?" (e.g., frequency, incontinence)     Dark and cloudy has odor  2. ONSET: "When did the  sx  start?"     1 month 3. PAIN: "Is there any pain?" If Yes, ask: "How bad is it?" (Scale: 1-10; mild, moderate, severe)     no 4. CAUSE: "What do you think is causing the symptoms?"     unsure 5. OTHER SYMPTOMS: "Do you have any other symptoms?" (e.g., blood in urine, fever, flank pain, pain with urination)     BS has been elevated  Protocols used: Urinary Symptoms-A-AH

## 2023-06-20 DIAGNOSIS — I10 Essential (primary) hypertension: Secondary | ICD-10-CM | POA: Diagnosis not present

## 2023-06-20 DIAGNOSIS — E7849 Other hyperlipidemia: Secondary | ICD-10-CM | POA: Diagnosis not present

## 2023-06-20 DIAGNOSIS — F5104 Psychophysiologic insomnia: Secondary | ICD-10-CM | POA: Diagnosis not present

## 2023-06-20 DIAGNOSIS — M4726 Other spondylosis with radiculopathy, lumbar region: Secondary | ICD-10-CM | POA: Diagnosis not present

## 2023-06-20 LAB — CYTOLOGY - PAP
Chlamydia: NEGATIVE
Comment: NEGATIVE
Comment: NEGATIVE
Comment: NORMAL
Diagnosis: NEGATIVE
High risk HPV: NEGATIVE
Neisseria Gonorrhea: NEGATIVE

## 2023-06-21 ENCOUNTER — Encounter: Payer: Self-pay | Admitting: Family Medicine

## 2023-06-21 ENCOUNTER — Ambulatory Visit (INDEPENDENT_AMBULATORY_CARE_PROVIDER_SITE_OTHER): Payer: Medicare PPO | Admitting: Family Medicine

## 2023-06-21 VITALS — BP 119/69 | HR 75 | Resp 16 | Ht 67.0 in | Wt 164.0 lb

## 2023-06-21 DIAGNOSIS — N3000 Acute cystitis without hematuria: Secondary | ICD-10-CM

## 2023-06-21 DIAGNOSIS — R399 Unspecified symptoms and signs involving the genitourinary system: Secondary | ICD-10-CM | POA: Diagnosis not present

## 2023-06-21 LAB — POCT URINALYSIS DIPSTICK
Bilirubin, UA: NEGATIVE
Blood, UA: NEGATIVE
Glucose, UA: NEGATIVE
Ketones, UA: 40
Leukocytes, UA: NEGATIVE
Nitrite, UA: POSITIVE
Protein, UA: NEGATIVE
Spec Grav, UA: 1.015 (ref 1.010–1.025)
Urobilinogen, UA: 1 U/dL
pH, UA: 6 (ref 5.0–8.0)

## 2023-06-21 MED ORDER — CEPHALEXIN 500 MG PO CAPS: 500.0000 mg | ORAL_CAPSULE | Freq: Four times a day (QID) | ORAL | 0 refills | Status: AC

## 2023-06-21 NOTE — Progress Notes (Signed)
Established patient visit   Patient: Barbara Anderson   DOB: 01/13/1971   52 y.o. Female  MRN: [id number] Visit Date: 06/21/2023  Today's healthcare provider: Mila Merry, MD   Chief Complaint  Patient presents with   cloudy urine   fasting glucose elevated   Subjective    Discussed the use of AI scribe software for clinical note transcription with the patient, who gave verbal consent to proceed.  History of Present Illness   The patient presents with a month-long history of urinary symptoms suggestive of a urinary tract infection. She reports that her urine is cloudy and has a strong odor, particularly in the mornings. Despite increased water intake during the day, which seems to temporarily alleviate the symptoms, the issue persists. The patient denies any associated fever, chills, or flank pain, but does note a feeling of pressure in the mornings.  The patient also mentions that her blood sugar levels have been slightly elevated over the past month, ranging from the nineties to hundreds, compared to her usual levels of seventy-nine to eighty-five. She has not made any recent changes to her medication regimen.  Approximately a week and a half prior to this visit, the patient was given a course of nitrofurantoin, which seemed to clear up the odor and other symptoms within a day. However, the symptoms returned after the completion of the antibiotic course. The patient has no known allergies and has not had a bladder infection for a significant period of time.       Medications: Outpatient Medications Prior to Visit  Medication Sig   Adapalene (DIFFERIN) 0.3 % gel Apply 1 application topically at bedtime.   Alcohol Swabs (B-D SINGLE USE SWABS REGULAR) PADS USE TO CLEANSE SKIN EVERY DAY BEFORE CHECKING BLOOD SUGAR   ALPRAZolam (XANAX) 1 MG tablet Take 1 mg by mouth 2 (two) times daily as needed.    amphetamine-dextroamphetamine (ADDERALL) 20 MG tablet Take 1 tablet (20 mg total)  by mouth in the morning, at noon, and at bedtime. Has not started yet 11/10/19   ARIPiprazole (ABILIFY) 5 MG tablet Take 5 mg by mouth daily.   atorvastatin (LIPITOR) 20 MG tablet Take 1 tablet (20 mg total) by mouth daily.   Cholecalciferol (VITAMIN D3) 25 MCG (1000 UT) CAPS Take 1 capsule (1,000 Units total) by mouth daily.   DULoxetine (CYMBALTA) 60 MG capsule Take 2 capsules (120 mg total) by mouth daily.   ferrous sulfate 325 (65 FE) MG tablet Take by mouth.   fluticasone (FLONASE) 50 MCG/ACT nasal spray USE 2 SPRAYS IN BOTH NOSTRILS  DAILY   folic acid (FOLVITE) 800 MCG tablet Take 1 tablet (800 mcg total) by mouth daily.   gabapentin (NEURONTIN) 300 MG capsule Take 1 capsule (300 mg total) by mouth at bedtime.   hydroquinone 2 % cream Apply topically at bedtime.   Lancets Misc. (ACCU-CHEK FASTCLIX LANCET) KIT To check blood sugar once daily   levothyroxine (SYNTHROID) 25 MCG tablet Take 1 tablet (25 mcg total) by mouth daily before breakfast.   lisinopril (ZESTRIL) 5 MG tablet Take 1 tablet (5 mg total) by mouth daily.   metoCLOPramide (REGLAN) 10 MG tablet Take by mouth.   metoprolol succinate (TOPROL-XL) 25 MG 24 hr tablet Take 1 tablet (25 mg total) by mouth daily.   pregabalin (LYRICA) 200 MG capsule Take 1 capsule (200 mg total) by mouth 2 (two) times daily.   QUEtiapine Fumarate (SEROQUEL XR) 150 MG 24 hr tablet Take 150  mg by mouth at bedtime.    ramelteon (ROZEREM) 8 MG tablet TAKE ONE TABLET BY MOUTH EVERY DAY AT BEDTIME   spironolactone (ALDACTONE) 25 MG tablet Take 1 tablet (25 mg total) by mouth daily.   tiZANidine (ZANAFLEX) 2 MG tablet Take 1 tablet (2 mg total) by mouth 3 (three) times daily as needed for muscle spasms.   topiramate (TOPAMAX) 200 MG tablet Take 1 tablet (200 mg total) by mouth at bedtime.   traZODone (DESYREL) 150 MG tablet Take 1 tablet (150 mg total) by mouth at bedtime.   Vitamin D, Ergocalciferol, (DRISDOL) 1.25 MG (50000 UNIT) CAPS capsule Take 1  capsule (50,000 Units total) by mouth every 7 (seven) days. Take with meal.   VRAYLAR 1.5 MG capsule Take 1.5 mg by mouth daily.   [DISCONTINUED] levofloxacin (LEVAQUIN) 500 MG tablet    [DISCONTINUED] nitrofurantoin, macrocrystal-monohydrate, (MACROBID) 100 MG capsule Take 1 capsule (100 mg total) by mouth 2 (two) times daily.   No facility-administered medications prior to visit.   Review of Systems     Objective    BP 119/69 (BP Location: Left Arm, Patient Position: Sitting, Cuff Size: Large)   Pulse 75   Resp 16   Ht 5\' 7"  (1.702 m)   Wt 164 lb (74.4 kg)   LMP 06/09/2023   BMI 25.69 kg/m   Physical Exam  Awake, alert, oriented x 3. In no apparent distress     Results for orders placed or performed in visit on 06/21/23  POCT urinalysis dipstick  Result Value Ref Range   Color, UA Yellow    Clarity, UA Clear    Glucose, UA Negative Negative   Bilirubin, UA Negative    Ketones, UA 40    Spec Grav, UA 1.015 1.010 - 1.025   Blood, UA Negative    pH, UA 6.0 5.0 - 8.0   Protein, UA Negative Negative   Urobilinogen, UA 1.0 0.2 or 1.0 E.U./dL   Nitrite, UA Positive    Leukocytes, UA Negative Negative   Appearance     Odor      Assessment & Plan        Urinary Tract Infection Cloudy, foul-smelling urine with morning pressure for a month. No fever, flank pain, nausea, or vomiting. Urinalysis positive for infection. No history of recent UTIs. -Prescribe Cephalexin (Keflex). -Send urine for culture and sensitivity to ensure appropriate antibiotic coverage.  Hyperglycemia Blood glucose levels in the 90s to 100s, higher than usual baseline of 79-85. No changes in medication. -Continue current management and monitor blood glucose levels.  General Health Maintenance -Discussed pneumonia vaccine, but deferred until insurance coverage is confirmed. -Discussed colonoscopy, but patient has difficulty with bowel prep. -Schedule follow-up appointment with Robynn Pane for A1c check  and general follow-up in a few months.    Return in about 8 weeks (around 08/16/2023) for diabetes with elise.      Mila Merry, MD  [facility] [phone] (phone) [phone] (fax)  [facility]

## 2023-06-21 NOTE — Patient Instructions (Signed)
.   Please review the attached list of medications and notify my office if there are any errors.   . Please bring all of your medications to every appointment so we can make sure that our medication list is the same as yours.   

## 2023-06-24 ENCOUNTER — Other Ambulatory Visit (HOSPITAL_COMMUNITY)
Admission: RE | Admit: 2023-06-24 | Discharge: 2023-06-24 | Disposition: A | Discharge status: Home / Self Care | Payer: Medicare PPO | Source: Ambulatory Visit

## 2023-06-24 ENCOUNTER — Ambulatory Visit: Payer: Medicare PPO | Admitting: Obstetrics

## 2023-06-24 ENCOUNTER — Encounter: Payer: Self-pay | Admitting: Family Medicine

## 2023-06-24 ENCOUNTER — Other Ambulatory Visit (HOSPITAL_COMMUNITY)
Admission: RE | Admit: 2023-06-24 | Discharge: 2023-06-24 | Disposition: A | Discharge status: Home / Self Care | Payer: Medicare PPO | Source: Ambulatory Visit | Attending: Obstetrics | Admitting: Obstetrics

## 2023-06-24 ENCOUNTER — Encounter: Payer: Self-pay | Admitting: Obstetrics

## 2023-06-24 VITALS — BP 100/60 | HR 66 | Ht 67.0 in | Wt 168.0 lb

## 2023-06-24 DIAGNOSIS — N841 Polyp of cervix uteri: Secondary | ICD-10-CM | POA: Insufficient documentation

## 2023-06-24 DIAGNOSIS — Z113 Encounter for screening for infections with a predominantly sexual mode of transmission: Secondary | ICD-10-CM | POA: Insufficient documentation

## 2023-06-24 DIAGNOSIS — Z3009 Encounter for other general counseling and advice on contraception: Secondary | ICD-10-CM

## 2023-06-24 NOTE — Progress Notes (Signed)
    GYNECOLOGY PROGRESS NOTE  Subjective:  PCP: Jacky Kindle, FNP  Patient ID: Barbara Anderson, female    DOB: 06/18/1971, 52 y.o.   MRN: [id number]  HPI  Patient is a 52 y.o. G14P0030 female who presents for cervical polyp removal and requested full STI testing, had some "unplanned sex" but no symptoms. Hormones recently tested and pt is still perimenopausal, she wants contraception. Has questions about Nexplanon. Had Mirena before and didn't like the pain from insertion, also had painful intercourse, could "feel" the IUD. Does not mind having her period, is still regular monthly and lasts 3 days, is light.   The following portions of the patient's history were reviewed and updated as appropriate: allergies, current medications, past family history, past medical history, past social history, past surgical history, and problem list.  Review of Systems Pertinent items are noted in HPI.   Objective:   Blood pressure 100/60, pulse 66, height 5\' 7"  (1.702 m), weight 168 lb (76.2 kg), last menstrual period 06/09/2023. Body mass index is 26.31 kg/m.  General appearance: alert, cooperative, and appears stated age Abdomen: soft, non-tender; bowel sounds normal; no masses,  no organomegaly Pelvic: cervix normal in appearance, external genitalia normal, no adnexal masses or tenderness, no cervical motion tenderness, rectovaginal septum normal, uterus normal size, shape, and consistency, vagina normal without discharge, and 1.5cm polyp at os.  Extremities: extremities normal, atraumatic, no cyanosis or edema Neurologic: Grossly normal  CERVICAL POLYPECTOMY Informed consent obtained. Cervix visualized and polyp noted.  Ring forcep applied to cervix and twisting motion removed polyp intact.  Hemostasis obtained. Specimen sent to pathology. EBL minimal.   Assessment/Plan:   1. Cervical polyp   2. Routine screening for STI (sexually transmitted infection)   3. Encounter for counseling regarding  contraception     -Polyp removed without complication, sent to pathology for confirmation.  -STI panel ordered -Different birth control methods including LARCs discussed/offered. Pt desires Nexplanon. Last unprotected intercourse 06/21/23.  -RTC in 2wks for insertion, no unprotected intercourse until that time or will need to be rescheduled, pt understands.    Julieanne Manson, DO [facility]

## 2023-06-25 LAB — HEP, RPR, HIV PANEL
HIV Screen 4th Generation wRfx: NONREACTIVE
Hepatitis B Surface Ag: NEGATIVE
RPR Ser Ql: NONREACTIVE

## 2023-06-26 ENCOUNTER — Other Ambulatory Visit: Payer: Self-pay

## 2023-06-26 DIAGNOSIS — R7309 Other abnormal glucose: Secondary | ICD-10-CM | POA: Diagnosis not present

## 2023-06-26 DIAGNOSIS — N760 Acute vaginitis: Secondary | ICD-10-CM

## 2023-06-26 LAB — CERVICOVAGINAL ANCILLARY ONLY
Bacterial Vaginitis (gardnerella): POSITIVE — AB
Candida Glabrata: NEGATIVE
Candida Vaginitis: NEGATIVE
Chlamydia: NEGATIVE
Comment: NEGATIVE
Comment: NEGATIVE
Comment: NEGATIVE
Comment: NEGATIVE
Comment: NEGATIVE
Comment: NORMAL
Neisseria Gonorrhea: NEGATIVE
Trichomonas: NEGATIVE

## 2023-06-26 LAB — URINE CULTURE

## 2023-06-26 LAB — SURGICAL PATHOLOGY

## 2023-06-26 MED ORDER — METRONIDAZOLE 500 MG PO TABS: 500.0000 mg | ORAL_TABLET | Freq: Two times a day (BID) | ORAL | 0 refills | Status: DC

## 2023-07-05 DIAGNOSIS — R7309 Other abnormal glucose: Secondary | ICD-10-CM | POA: Diagnosis not present

## 2023-07-06 DIAGNOSIS — I10 Essential (primary) hypertension: Secondary | ICD-10-CM | POA: Diagnosis not present

## 2023-07-06 DIAGNOSIS — F5104 Psychophysiologic insomnia: Secondary | ICD-10-CM | POA: Diagnosis not present

## 2023-07-06 DIAGNOSIS — M4726 Other spondylosis with radiculopathy, lumbar region: Secondary | ICD-10-CM | POA: Diagnosis not present

## 2023-07-06 DIAGNOSIS — E7849 Other hyperlipidemia: Secondary | ICD-10-CM | POA: Diagnosis not present

## 2023-07-08 ENCOUNTER — Encounter: Payer: Self-pay | Admitting: Obstetrics

## 2023-07-08 ENCOUNTER — Ambulatory Visit (INDEPENDENT_AMBULATORY_CARE_PROVIDER_SITE_OTHER): Payer: Medicare PPO | Admitting: Obstetrics

## 2023-07-08 VITALS — BP 108/68 | HR 68 | Ht 67.0 in | Wt 171.0 lb

## 2023-07-08 DIAGNOSIS — Z3202 Encounter for pregnancy test, result negative: Secondary | ICD-10-CM | POA: Diagnosis not present

## 2023-07-08 DIAGNOSIS — Z30017 Encounter for initial prescription of implantable subdermal contraceptive: Secondary | ICD-10-CM | POA: Diagnosis not present

## 2023-07-08 LAB — POCT URINE PREGNANCY: Preg Test, Ur: NEGATIVE

## 2023-07-08 MED ORDER — ETONOGESTREL 68 MG ~~LOC~~ IMPL
68.0000 mg | DRUG_IMPLANT | Freq: Once | SUBCUTANEOUS | Status: AC
  Administered 2023-07-08: 68 mg via SUBCUTANEOUS

## 2023-07-08 NOTE — Progress Notes (Signed)
     GYNECOLOGY OFFICE PROCEDURE NOTE  Barbara Anderson is a 52 y.o. G20P0030 perimenopausal female here for Nexplanon insertion for contraception, is still having regular monthly periods.  Last pap smear was on 06/17/23 and was normal.  No other gynecologic concerns.  Nexplanon Insertion Procedure Patient identified, informed consent performed, consent signed.   Patient does understand that irregular bleeding is a very common side effect of this medication. She was advised to have backup contraception for one week after placement. Pregnancy test in clinic today was negative.  Appropriate time out taken.  Patient's left arm was prepped and draped in the usual sterile fashion. The ruler used to measure and mark insertion area.  Patient was prepped with alcohol swab and then injected with 3 ml of 1% lidocaine.  She was prepped with betadine, Nexplanon removed from packaging,  Device confirmed in needle, then inserted full length of needle and withdrawn per handbook instructions. Nexplanon was able to palpated in the patient's arm; patient palpated the insert herself. There was minimal blood loss.  Patient insertion site covered with guaze and a pressure bandage to reduce any bruising.  The patient tolerated the procedure well and was given post procedure instructions.    Julieanne Manson DO [facility]

## 2023-07-10 ENCOUNTER — Encounter: Payer: Self-pay | Admitting: Obstetrics

## 2023-07-10 ENCOUNTER — Other Ambulatory Visit: Payer: Self-pay | Admitting: Medical Genetics

## 2023-07-12 ENCOUNTER — Other Ambulatory Visit: Payer: Self-pay | Admitting: Family Medicine

## 2023-07-12 DIAGNOSIS — L700 Acne vulgaris: Secondary | ICD-10-CM

## 2023-07-12 DIAGNOSIS — E1159 Type 2 diabetes mellitus with other circulatory complications: Secondary | ICD-10-CM

## 2023-07-12 DIAGNOSIS — E034 Atrophy of thyroid (acquired): Secondary | ICD-10-CM

## 2023-07-12 DIAGNOSIS — I152 Hypertension secondary to endocrine disorders: Secondary | ICD-10-CM

## 2023-07-12 DIAGNOSIS — E782 Mixed hyperlipidemia: Secondary | ICD-10-CM

## 2023-07-12 NOTE — Telephone Encounter (Signed)
Requested Prescriptions  Pending Prescriptions Disp Refills   atorvastatin (LIPITOR) 20 MG tablet [Pharmacy Med Name: atorvastatin 20 mg tablet] 100 tablet 0    Sig: TAKE ONE TABLET (20 MG TOTAL) BY MOUTH DAILY     Cardiovascular:  Antilipid - Statins Failed - 07/12/2023 11:24 AM      Failed - Lipid Panel in normal range within the last 12 months    Cholesterol, Total  Date Value Ref Range Status  01/26/2022 213 (H) 100 - 199 mg/dL Final   Cholesterol  Date Value Ref Range Status  05/13/2023 193 0 - 200 Final   LDL Chol Calc (NIH)  Date Value Ref Range Status  01/26/2022 143 (H) 0 - 99 mg/dL Final   LDL Cholesterol  Date Value Ref Range Status  05/13/2023 131  Final   HDL  Date Value Ref Range Status  05/13/2023 44 35 - 70 Final  01/26/2022 50 >39 mg/dL Final   Triglycerides  Date Value Ref Range Status  05/13/2023 98 40 - 160 Final         Passed - Patient is not pregnant      Passed - Valid encounter within last 12 months    Recent Outpatient Visits           3 weeks ago UTI symptoms   [place] [facility] Malva Limes, MD   1 month ago UTI symptoms   [place] [facility] Merita Norton T, FNP   8 months ago Traumatic brain injury with loss of consciousness, sequela [facility])   [place] [facility] Merita Norton T, FNP   8 months ago Traumatic brain injury with loss of consciousness, sequela [facility])   [place] [facility] Merita Norton T, FNP   11 months ago Melasma   [place] [facility] [place], Marzella Schlein, MD       Future Appointments             In 1 month [place], Edmon Crape, PA-C [facility], [facility]   In 1 month Ostwalt, [place], PA-C  [facility], [facility]             lisinopril (ZESTRIL) 5 MG tablet [place] Med Name: lisinopril 5 mg tablet] 100 tablet 0    Sig: TAKE ONE TABLET (5 MG TOTAL) BY MOUTH DAILY      Cardiovascular:  ACE Inhibitors Failed - 07/12/2023 11:24 AM      Failed - K in normal range and within 180 days    Potassium  Date Value Ref Range Status  01/26/2022 4.3 3.5 - 5.2 mmol/L Final  05/12/2014 3.0 (L) 3.5 - 5.1 mmol/L Final         Passed - Cr in normal range and within 180 days    Creatinine  Date Value Ref Range Status  05/13/2023 1.0 0.5 - 1.1 Final  05/12/2014 0.88 0.60 - 1.30 mg/dL Final   Creat  Date Value Ref Range Status  04/22/2014 0.79 0.50 - 1.10 mg/dL Final   Creatinine, Ser  Date Value Ref Range Status  01/26/2022 1.10 (H) 0.57 - 1.00 mg/dL Final         Passed - Patient is not pregnant      Passed - Last BP in normal range    BP Readings from Last 1 Encounters:  07/08/23 108/68         Passed - Valid encounter within last 6 months  Recent Outpatient Visits           3 weeks ago UTI symptoms   [place] [facility] Malva Limes, MD   1 month ago UTI symptoms   [facility] Merita Norton T, FNP   8 months ago Traumatic brain injury with loss of consciousness, sequela [facility])   [place] [facility] Merita Norton T, FNP   8 months ago Traumatic brain injury with loss of consciousness, sequela [facility])   [place] [facility] Jacky Kindle, FNP   11 months ago Melasma   [place] [facility] [place], Marzella Schlein, MD       Future Appointments             In 1 month Ostwalt, [place], PA-C [place] [facility], [facility]   In 1 month [place], [place], PA-C [place] [facility], [facility]             metoprolol succinate (TOPROL-XL) 25 MG 24 hr tablet [Pharmacy Med Name: metoprolol succinate ER 25 mg tablet,extended release 24 hr] 100 tablet 0    Sig: TAKE ONE TABLET (25 MG TOTAL) BY MOUTH DAILY     Cardiovascular:  Beta Blockers Passed - 07/12/2023 11:24 AM      Passed - Last BP in normal range    BP Readings from Last 1  Encounters:  07/08/23 108/68         Passed - Last Heart Rate in normal range    Pulse Readings from Last 1 Encounters:  07/08/23 68         Passed - Valid encounter within last 6 months    Recent Outpatient Visits           3 weeks ago UTI symptoms   [facility] Mal[facility] Limes, MD   1 month ago UTI symptoms   [facility] Merita Norton T, FNP   8 months ago Traumatic brain injury with loss of consciousness, sequela [facility])   [place] [facility] Merita Norton T, FNP   8 months ago Traumatic brain injury with loss of consciousness, sequela [facility])   [place] [facility] Merita Norton T, FNP   11 months ago Melasma   [place] [facility] [place], Marzella Schlein, MD       Future Appointments             In 1 month Ostwalt, Edmon Crape, PA-C  [facility], [facility]   In 1 month [place], [place], PA-C  [facility], [facility]             spironolactone (ALDACTONE) 25 MG tablet [Pharmacy Med Name: spironolactone 25 mg tablet] 100 tablet 0    Sig: TAKE ONE TABLET (25 MG TOTAL) BY MOUTH DAILY     Cardiovascular: Diuretics - Aldosterone Antagonist Failed - 07/12/2023 11:24 AM      Failed - K in normal range and within 180 days    Potassium  Date Value Ref Range Status  01/26/2022 4.3 3.5 - 5.2 mmol/L Final  05/12/2014 3.0 (L) 3.5 - 5.1 mmol/L Final         Passed - Cr in normal range and within 180 days    Creatinine  Date Value Ref Range Status  05/13/2023 1.0 0.5 - 1.1 Final  05/12/2014 0.88 0.60 - 1.30 mg/dL Final   Creat  Date Value Ref Range  Status  04/22/2014 0.79 0.50 - 1.10 mg/dL Final   Creatinine, Ser  Date Value Ref Range Status  01/26/2022 1.10 (H) 0.57 - 1.00 mg/dL Final         Passed - Na in normal range and within 180 days    Sodium  Date Value Ref Range Status  05/13/2023 143 137 - 147 Final  05/12/2014 144 136 -  145 mmol/L Final         Passed - eGFR is 30 or above and within 180 days    EGFR (African American)  Date Value Ref Range Status  10/21/2013 >60  Final   GFR calc Af Amer  Date Value Ref Range Status  02/04/2020 76 >59 mL/min/1.73 Final    Comment:    **[facility] currently reports eGFR in compliance with the current**   recommendations of the [employer]. [facility] will   update reporting as new guidelines are published from the NKF-ASN   Task force.    EGFR (Non-African Amer.)  Date Value Ref Range Status  10/21/2013 >60  Final    Comment:    eGFR values <55mL/min/1.73 m2 may be an indication of chronic kidney disease (CKD). Calculated eGFR is useful in patients with stable renal function. The eGFR calculation will not be reliable in acutely ill patients when serum creatinine is changing rapidly. It is not useful in  patients on dialysis. The eGFR calculation may not be applicable to patients at the low and high extremes of body sizes, pregnant women, and vegetarians.    GFR calc non Af Amer  Date Value Ref Range Status  02/04/2020 66 >59 mL/min/1.73 Final   eGFR  Date Value Ref Range Status  05/13/2023 72  Final  01/26/2022 60 >59 mL/min/1.73 Final         Passed - Last BP in normal range    BP Readings from Last 1 Encounters:  07/08/23 108/68         Passed - Valid encounter within last 6 months    Recent Outpatient Visits           3 weeks ago UTI symptoms   [facility] Limes, MD   1 month ago UTI symptoms   [facility] T, FNP   8 months ago Traumatic brain injury with loss of consciousness, sequela [facility])   [place] [facility] Mer[facility] Norton T, FNP   8 months ago Traumatic brain injury with loss of consciousness, sequela [facility])   [facility] Jacky Kindle, FNP   11 months ago Melasma   [place], Marzella Schlein, MD       Future Appointments             In 1 month [place], Edmon Crape, PA-C [place] [facility], [facility]   In 1 month [place]walt, [place], PA-C [place] [facility], [facility]             levothyroxine (SYNTHROID) 25 MCG tablet [place] Med Name: levothyroxine 25 mcg tablet] 100 tablet 0    Sig: TAKE ONE TABLET (25 MCG TOTAL) BY MOUTH DAILY BEFORE BREAKFAST     Endocrinology:  Hypothyroid Agents Failed - 07/12/2023 11:24 AM      Failed - TSH in normal range and within 360 days    TSH  Date Value Ref Range Status  11/28/2021 1.410 0.450 - 4.500 uIU/mL Final  Passed - Valid encounter within last 12 months    Recent Outpatient Visits           3 weeks ago UTI symptoms   [place] [facility] Malva Limes, MD   1 month ago UTI symptoms   [facility] Merita Norton T, FNP   8 months ago Traumatic brain injury with loss of consciousness, sequela [facility])   [place] [facility] Merita Norton T, FNP   8 months ago Traumatic brain injury with loss of consciousness, sequela [facility])   [facility] Jacky Kindle, FNP   11 months ago Melasma   [place] [facility] [place], Marzella Schlein, MD       Future Appointments             In 1 month Ostwalt, Edmon Crape, PA-C [place] [facility], [facility]   In 1 month [place]alt, [place], PA-C [place] [facility], [place]

## 2023-07-16 DIAGNOSIS — R7309 Other abnormal glucose: Secondary | ICD-10-CM | POA: Diagnosis not present

## 2023-07-19 ENCOUNTER — Other Ambulatory Visit (HOSPITAL_COMMUNITY)
Admission: RE | Admit: 2023-07-19 | Discharge: 2023-07-19 | Disposition: A | Discharge status: Home / Self Care | Payer: Medicare PPO | Source: Ambulatory Visit | Attending: Obstetrics | Admitting: Obstetrics

## 2023-07-19 ENCOUNTER — Ambulatory Visit (INDEPENDENT_AMBULATORY_CARE_PROVIDER_SITE_OTHER): Payer: Medicare PPO

## 2023-07-19 VITALS — BP 128/81 | HR 59 | Wt 159.7 lb

## 2023-07-19 DIAGNOSIS — Z9189 Other specified personal risk factors, not elsewhere classified: Secondary | ICD-10-CM | POA: Diagnosis not present

## 2023-07-19 DIAGNOSIS — N898 Other specified noninflammatory disorders of vagina: Secondary | ICD-10-CM

## 2023-07-19 DIAGNOSIS — Z113 Encounter for screening for infections with a predominantly sexual mode of transmission: Secondary | ICD-10-CM | POA: Insufficient documentation

## 2023-07-19 NOTE — Patient Instructions (Signed)
Safe Sex Practicing safe sex means taking steps before and during sex to reduce your risk of: Getting an STI (sexually transmitted infection). Giving your partner an STI. Unwanted or unplanned pregnancy. How to practice safe sex Ways you can practice safe sex  Limit your sexual partners to only one partner who is having sex with only you. Avoid using alcohol and drugs before having sex. Alcohol and drugs can affect your judgment. Before having sex with a new partner: Talk to your partner about past partners, past STIs, and drug use. Get screened for STIs and discuss the results with your partner. Ask your partner to get screened too. Check your body regularly for sores, blisters, rashes, or unusual discharge. If you notice any of these problems, visit your health care provider. Avoid sexual contact if you have symptoms of an infection or you are being treated for an STI. While having sex, use a condom. Make sure to: Use a condom every time you have vaginal, oral, or anal sex. Both females and males should wear condoms during oral sex. Keep condoms in place from the beginning to the end of sexual activity. Use a latex condom, if possible. Latex condoms offer the best protection. Use only water-based lubricants with a condom. Using petroleum-based lubricants or oils will weaken the condom and increase the chance that it will break. Ways your health care provider can help you practice safe sex  See your health care provider for regular screenings, exams, and tests for STIs. Talk with your health care provider about what kind of birth control (contraception) is best for you. Get vaccinated against hepatitis B and human papillomavirus (HPV). If you are at risk of being infected with HIV (human immunodeficiency virus), talk with your health care provider about taking a prescription medicine to prevent HIV infection. You are at risk for HIV if you: Are a man who has sex with other men. Are  sexually active with more than one partner. Take drugs by injection. Have a sex partner who has HIV. Have unprotected sex. Have sex with someone who has sex with both men and women. Have had an STI. Follow these instructions at home: Take over-the-counter and prescription medicines only as told by your health care provider. Keep all follow-up visits. This is important. Where to find more information Centers for Disease Control and Prevention: FootballExhibition.com.br Planned Parenthood: www.plannedparenthood.org Office on [employer] Health: http://hoffman.com/ Summary Practicing safe sex means taking steps before and during sex to reduce your risk getting an STI, giving your partner an STI, and having an unwanted or unplanned pregnancy. Before having sex with a new partner, talk to your partner about past partners, past STIs, and drug use. Use a condom every time you have vaginal, oral, or anal sex. Both females and males should wear condoms during oral sex. Check your body regularly for sores, blisters, rashes, or unusual discharge. If you notice any of these problems, visit your health care provider. See your health care provider for regular screenings, exams, and tests for STIs. This information is not intended to replace advice given to you by your health care provider. Make sure you discuss any questions you have with your health care provider. Document Revised: 12/21/2019 Document Reviewed: 12/21/2019 Elsevier Patient Education  2024 Elsevier Inc.    Vaginitis   Vaginitis is irritation and swelling of the vagina. Treatment will depend on the cause. What are the causes? It can be caused by: Bacteria. Yeast. A parasite. A virus. Low hormone levels. Bubble  baths, scented tampons, and feminine sprays. Other things can change the balance of the yeast and bacteria that live in the vagina. These include: Antibiotic medicines. Not being clean enough. Some birth control  methods. Sex. Infection. Diabetes. A weakened body defense system (immune system). What increases the risk? Smoking or being around someone who smokes. Using washes (douches), scented tampons, or scented pads. Wearing tight pants or thong underwear. Using birth control pills or an IUD. Having sex without a condom or having a lot of partners. Having an STI. Using a certain product to kill sperm (nonoxynol-9). Eating foods that are high in sugar. Having diabetes. Having low levels of a female hormone. Having a weakened body defense system. Being pregnant or breastfeeding. What are the signs or symptoms? Fluid coming from the vagina that is not normal. A bad smell. Itching, pain, or swelling. Pain with sex. Pain or burning when you pee (urinate). Sometimes there are no symptoms. How is this treated? Treatment may include: Antibiotic creams or pills. Antifungal medicines. Medicines to ease symptoms if you have a virus. Your sex partner should also be treated. Estrogen medicines. Avoiding scented soaps, sprays, or douches. Stopping use of products that caused irritation and then using a cream to treat symptoms. Follow these instructions at home: Lifestyle Keep the area around your vagina clean and dry. Avoid using soap. Rinse the area with water. Until your doctor says it is okay: Do not use washes for the vagina. Do not use tampons. Do not have sex. Wipe from front to back after going to the bathroom. When your doctor says it is okay, practice safe sex and use condoms. General instructions Take over-the-counter and prescription medicines only as told by your doctor. If you were prescribed an antibiotic medicine, take or use it as told by your doctor. Do not stop taking or using it even if you start to feel better. Keep all follow-up visits. How is this prevented? Do not use things that can irritate the vagina, such as fabric softeners. Avoid these products if they are  scented: Sprays. Detergents. Tampons. Products for cleaning the vagina. Soaps or bubble baths. Let air reach your vagina. To do this: Wear cotton underwear. Do not wear: Underwear while you sleep. Tight pants. Thong underwear. Underwear or nylons without a cotton panel. Take off any wet clothing, such as bathing suits, as soon as you can. Practice safe sex and use condoms. Contact a doctor if: You have pain in your belly or in the area between your hips. You have a fever or chills. Your symptoms last for more than 2-3 days. Get help right away if: You have a fever and your symptoms get worse all of a sudden. Summary Vaginitis is irritation and swelling of the vagina. Treatment will depend on the cause of the condition. Do not use washes or tampons or have sex until your doctor says it is okay. This information is not intended to replace advice given to you by your health care provider. Make sure you discuss any questions you have with your health care provider. Document Revised: 01/14/2020 Document Reviewed: 01/14/2020 Elsevier Patient Education  2024 [employer].

## 2023-07-19 NOTE — Progress Notes (Signed)
    NURSE VISIT NOTE  Subjective:    Patient ID: Barbara Anderson, female    DOB: Jul 01, 1971, 52 y.o.   MRN: [id number]  HPI  Patient is a 52 y.o. [id number] female who presents for STD screening, patient reports that she recently had unprotected sex and condom did break, patient also reports concerns of vaginal itching for 1 day(s). Denies abnormal vaginal bleeding or significant pelvic pain or fever. denies dysuria, hematuria, urinary frequency, urinary urgency, flank pain, abdominal pain, pelvic pain, cloudy malordorous urine, and vaginal discharge. Patient denies history of known exposure to STD.   Objective:    BP 128/81   Pulse (!) 59   Wt 159 lb 11.2 oz (72.4 kg)   LMP 07/03/2023   BMI 25.01 kg/m      Assessment:   1. At risk for sexually transmitted disease due to unprotected sex     rule out GC or chlamydia and nonspecific vaginitis  Plan:   GC and chlamydia DNA  probe sent to lab. Treatment: will await results of cervicovaginal swab, abstain from coitus during course of treatment ROV prn if symptoms persist or worsen.   Fonda Kinder, CMA

## 2023-07-23 LAB — CERVICOVAGINAL ANCILLARY ONLY
Bacterial Vaginitis (gardnerella): NEGATIVE
Candida Glabrata: NEGATIVE
Candida Vaginitis: NEGATIVE
Chlamydia: NEGATIVE
Comment: NEGATIVE
Comment: NEGATIVE
Comment: NEGATIVE
Comment: NEGATIVE
Comment: NEGATIVE
Comment: NORMAL
Neisseria Gonorrhea: NEGATIVE
Trichomonas: NEGATIVE

## 2023-07-26 DIAGNOSIS — R7309 Other abnormal glucose: Secondary | ICD-10-CM | POA: Diagnosis not present

## 2023-08-06 DIAGNOSIS — R7309 Other abnormal glucose: Secondary | ICD-10-CM | POA: Diagnosis not present

## 2023-08-07 DIAGNOSIS — I10 Essential (primary) hypertension: Secondary | ICD-10-CM | POA: Diagnosis not present

## 2023-08-07 DIAGNOSIS — M4726 Other spondylosis with radiculopathy, lumbar region: Secondary | ICD-10-CM | POA: Diagnosis not present

## 2023-08-07 DIAGNOSIS — E7849 Other hyperlipidemia: Secondary | ICD-10-CM | POA: Diagnosis not present

## 2023-08-07 DIAGNOSIS — F5104 Psychophysiologic insomnia: Secondary | ICD-10-CM | POA: Diagnosis not present

## 2023-08-13 ENCOUNTER — Ambulatory Visit: Payer: Medicare PPO | Admitting: Family Medicine

## 2023-08-13 ENCOUNTER — Telehealth: Payer: Self-pay | Admitting: Physician Assistant

## 2023-08-13 ENCOUNTER — Ambulatory Visit: Payer: Medicare PPO | Admitting: Physician Assistant

## 2023-08-13 VITALS — BP 127/84 | HR 63 | Ht 67.0 in | Wt 162.0 lb

## 2023-08-13 DIAGNOSIS — E1159 Type 2 diabetes mellitus with other circulatory complications: Secondary | ICD-10-CM

## 2023-08-13 DIAGNOSIS — E034 Atrophy of thyroid (acquired): Secondary | ICD-10-CM

## 2023-08-13 DIAGNOSIS — J0140 Acute pansinusitis, unspecified: Secondary | ICD-10-CM

## 2023-08-13 DIAGNOSIS — I152 Hypertension secondary to endocrine disorders: Secondary | ICD-10-CM | POA: Diagnosis not present

## 2023-08-13 DIAGNOSIS — R197 Diarrhea, unspecified: Secondary | ICD-10-CM

## 2023-08-13 DIAGNOSIS — K648 Other hemorrhoids: Secondary | ICD-10-CM | POA: Diagnosis not present

## 2023-08-13 DIAGNOSIS — F419 Anxiety disorder, unspecified: Secondary | ICD-10-CM | POA: Diagnosis not present

## 2023-08-13 NOTE — Telephone Encounter (Signed)
 [facility] faxed refill request for the following medications:   fluticasone (FLONASE) 50 MCG/ACT nasal spray    Please advise.

## 2023-08-13 NOTE — Progress Notes (Addendum)
 Established patient visit  Patient: Barbara Anderson   DOB: 08/18/1970   53 y.o. Female  MRN: [id number] Visit Date: 08/13/2023  Today's healthcare provider: Jolynn Spencer, PA-C   No chief complaint on file.  Subjective     HPI    Pt stated--butt pain.   N-knot is painful, itches, burning,  L-butt area D-1 month O-intermittent C-same not worse A-stretching with bowel movement T-OTC hemorrhoid ointment  Last edited by Deitra Therisa HERO, CMA on 08/13/2023  1:31 PM.       Discussed the use of AI scribe software for clinical note transcription with the patient, who gave verbal consent to proceed.  History of Present Illness   The patient, with a history of UTI, presents with a one month history of an anal mass, suspected to be a hemorrhoid. The mass is hard, becomes larger with bowel movements, and is painful if she has more than two bowel movements a day. She denies constipation and reports diarrhea a couple times a week, particularly with certain carbohydrates. She denies significant rectal bleeding, but notes minor bleeding with wiping. She has been using hemorrhoid cream for symptom management.           08/13/2023    1:10 PM 06/21/2023    8:20 AM 05/21/2023   11:04 AM  Depression screen PHQ 2/9  Decreased Interest 0 0 1  Down, Depressed, Hopeless 0 0 0  PHQ - 2 Score 0 0 1  Altered sleeping 0 1 1  Tired, decreased energy 0 0 0  Change in appetite 0 0 0  Feeling bad or failure about yourself  0 0 0  Trouble concentrating 0 1 1  Moving slowly or fidgety/restless 0 0 0  Suicidal thoughts 0 0 0  PHQ-9 Score 0 2 3  Difficult doing work/chores Not difficult at all Not difficult at all Somewhat difficult      06/21/2023    8:20 AM 05/21/2023   11:04 AM 01/11/2021   11:44 AM 07/08/2019    2:00 PM  GAD 7 : Generalized Anxiety Score  Nervous, Anxious, on Edge 0 0 3 0  Control/stop worrying 1 1 3  0  Worry too much - different things 1 1 3 1   Trouble relaxing 0 1 3 1    Restless  0 2 0  Easily annoyed or irritable 0 0 3 1  Afraid - awful might happen 1 1 1  0  Total GAD 7 Score  4 18 3   Anxiety Difficulty Not difficult at all Somewhat difficult Extremely difficult Somewhat difficult    Medications: Outpatient Medications Prior to Visit  Medication Sig   Adapalene  (DIFFERIN ) 0.3 % gel Apply 1 application topically at bedtime.   Alcohol Swabs (B-D SINGLE USE SWABS REGULAR) PADS USE TO CLEANSE SKIN EVERY DAY BEFORE CHECKING BLOOD SUGAR   ALPRAZolam (XANAX) 1 MG tablet Take 1 mg by mouth 2 (two) times daily as needed.    amphetamine-dextroamphetamine (ADDERALL) 20 MG tablet Take 1 tablet (20 mg total) by mouth in the morning, at noon, and at bedtime. Has not started yet 11/10/19   ARIPiprazole  (ABILIFY ) 5 MG tablet Take 5 mg by mouth daily.   atorvastatin  (LIPITOR) 20 MG tablet TAKE ONE TABLET (20 MG TOTAL) BY MOUTH DAILY   Cholecalciferol (VITAMIN D3) 25 MCG (1000 UT) CAPS Take 1 capsule (1,000 Units total) by mouth daily.   DULoxetine  (CYMBALTA ) 60 MG capsule Take 2 capsules (120 mg total) by mouth daily.   etonogestrel  (NEXPLANON ) 68 MG IMPL  implant 1 each by Subdermal route once.   ferrous sulfate 325 (65 FE) MG tablet Take by mouth.   fluticasone  (FLONASE ) 50 MCG/ACT nasal spray USE 2 SPRAYS IN BOTH NOSTRILS  DAILY   folic acid  (FOLVITE ) 800 MCG tablet Take 1 tablet (800 mcg total) by mouth daily.   gabapentin  (NEURONTIN ) 300 MG capsule Take 1 capsule (300 mg total) by mouth at bedtime.   hydroquinone  2 % cream Apply topically at bedtime.   Lancets Misc. (ACCU-CHEK FASTCLIX LANCET) KIT To check blood sugar once daily   levothyroxine  (SYNTHROID ) 25 MCG tablet TAKE ONE TABLET (25 MCG TOTAL) BY MOUTH DAILY BEFORE BREAKFAST   lisinopril  (ZESTRIL ) 5 MG tablet TAKE ONE TABLET (5 MG TOTAL) BY MOUTH DAILY   metoCLOPramide (REGLAN) 10 MG tablet Take by mouth.   metoprolol  succinate (TOPROL -XL) 25 MG 24 hr tablet TAKE ONE TABLET (25 MG TOTAL) BY MOUTH DAILY    metroNIDAZOLE  (FLAGYL ) 500 MG tablet Take 1 tablet (500 mg total) by mouth 2 (two) times daily.   pregabalin  (LYRICA ) 200 MG capsule Take 1 capsule (200 mg total) by mouth 2 (two) times daily.   QUEtiapine  Fumarate (SEROQUEL  XR) 150 MG 24 hr tablet Take 150 mg by mouth at bedtime.    ramelteon  (ROZEREM ) 8 MG tablet TAKE ONE TABLET BY MOUTH EVERY DAY AT BEDTIME   spironolactone  (ALDACTONE ) 25 MG tablet TAKE ONE TABLET (25 MG TOTAL) BY MOUTH DAILY   tiZANidine  (ZANAFLEX ) 2 MG tablet Take 1 tablet (2 mg total) by mouth 3 (three) times daily as needed for muscle spasms.   topiramate  (TOPAMAX ) 200 MG tablet Take 1 tablet (200 mg total) by mouth at bedtime.   traZODone  (DESYREL ) 150 MG tablet Take 1 tablet (150 mg total) by mouth at bedtime.   Vitamin D , Ergocalciferol , (DRISDOL ) 1.25 MG (50000 UNIT) CAPS capsule Take 1 capsule (50,000 Units total) by mouth every 7 (seven) days. Take with meal.   VRAYLAR 1.5 MG capsule Take 1.5 mg by mouth daily.   No facility-administered medications prior to visit.    Review of Systems All negative Except see HPI       Objective    BP 127/84 (BP Location: Left Arm, Patient Position: Sitting, Cuff Size: Normal)   Pulse 63   Ht 5' 7 (1.702 m)   Wt 162 lb (73.5 kg)   SpO2 97%   BMI 25.37 kg/m     Physical Exam Vitals reviewed.  Constitutional:      General: She is not in acute distress.    Appearance: Normal appearance. She is well-developed. She is not diaphoretic.  HENT:     Head: Normocephalic and atraumatic.  Eyes:     General: No scleral icterus.    Conjunctiva/sclera: Conjunctivae normal.  Neck:     Thyroid : No thyromegaly.  Cardiovascular:     Rate and Rhythm: Normal rate and regular rhythm.     Pulses: Normal pulses.     Heart sounds: Normal heart sounds. No murmur heard. Pulmonary:     Effort: Pulmonary effort is normal. No respiratory distress.     Breath sounds: Normal breath sounds. No wheezing, rhonchi or rales.   Musculoskeletal:     Cervical back: Neck supple.     Right lower leg: No edema.     Left lower leg: No edema.  Lymphadenopathy:     Cervical: No cervical adenopathy.  Skin:    General: Skin is warm and dry.     Findings: No rash.  Neurological:  Mental Status: She is alert and oriented to person, place, and time. Mental status is at baseline.  Psychiatric:        Mood and Affect: Mood normal.        Behavior: Behavior normal.      No results found for any visits on 08/13/23.      Assessment and Plan    Hemorrhoids New onset of external hemorrhoids for the past month, exacerbated by diarrhea. Painful with multiple bowel movements and minor bleeding noted. No constipation reported. -Refer to Gastroenterology for further evaluation and possible removal. -Advise to maintain a diet that avoids diarrhea, increase water intake, and consume appropriate fiber. -Recommend use of hemorrhoid cream for symptom relief.  Diarrhea Occurs a couple of times a week, possibly related to carbohydrate intake. Diarrhea exacerbates hemorrhoid symptoms. -Advise to monitor diet closely, particularly carbohydrate intake, to avoid diarrhea.  In the setting of  Hypertension chronic Patient has lost significant weight (155 lbs) and is currently on multiple antihypertensive medications.: lisinopril  5, metoprolol  25, spironolactone  25. Last blood work was done three months ago. -Schedule appointment in one month for full chronic condition review and possible medication adjustment. -Advise patient to monitor blood pressure at home, alternating between morning and evening readings, and bring the data to the next appointment. -Order comprehensive blood work to be done at the next appointment.  Anxiety/Behavioral Health chronic Patient is on psychiatric medications. -Advise patient to schedule an appointment with Behavioral Health for possible medication adjustment.  Hypothyroidism chronic Patient  is on thyroid  medication/levothyroxine  25mcg. -Include thyroid  function tests in the ordered blood work.      Orders Placed This Encounter  Procedures   Ambulatory referral to Gastroenterology    Referral Priority:   Routine    Referral Type:   Consultation    Referral Reason:   Specialty Services Required    Number of Visits Requested:   1    Return in about 4 weeks (around 09/10/2023) for chronic disease f/u.   The patient was advised to call back or seek an in-person evaluation if the symptoms worsen or if the condition fails to improve as anticipated.  I discussed the assessment and treatment plan with the patient. The patient was provided an opportunity to ask questions and all were answered. The patient agreed with the plan and demonstrated an understanding of the instructions.  I, Abbigale Mcelhaney, PA-C have reviewed all documentation for this visit. The documentation on 08/13/2023  for the exam, diagnosis, procedures, and orders are all accurate and complete.  Jolynn Spencer, [facility], MMS [facility] [phone] (phone) [phone] (fax)  [facility]

## 2023-08-14 ENCOUNTER — Encounter: Payer: Self-pay | Admitting: Physician Assistant

## 2023-08-14 ENCOUNTER — Other Ambulatory Visit
Admission: RE | Admit: 2023-08-14 | Discharge: 2023-08-14 | Disposition: A | Discharge status: Home / Self Care | Payer: Self-pay | Source: Ambulatory Visit | Attending: Medical Genetics | Admitting: Medical Genetics

## 2023-08-14 DIAGNOSIS — K648 Other hemorrhoids: Secondary | ICD-10-CM | POA: Insufficient documentation

## 2023-08-14 MED ORDER — FLUTICASONE PROPIONATE 50 MCG/ACT NA SUSP: NASAL | 0 refills | Status: DC

## 2023-08-16 ENCOUNTER — Ambulatory Visit: Payer: Medicare PPO | Admitting: Family Medicine

## 2023-08-21 ENCOUNTER — Encounter: Payer: Self-pay | Admitting: Emergency Medicine

## 2023-08-25 DIAGNOSIS — R7309 Other abnormal glucose: Secondary | ICD-10-CM | POA: Diagnosis not present

## 2023-08-26 LAB — GENECONNECT MOLECULAR SCREEN: Genetic Analysis Overall Interpretation: NEGATIVE

## 2023-08-28 ENCOUNTER — Ambulatory Visit: Payer: Medicare PPO | Admitting: Emergency Medicine

## 2023-08-28 ENCOUNTER — Encounter: Payer: Medicare PPO | Admitting: Family Medicine

## 2023-08-28 ENCOUNTER — Encounter: Payer: Medicare PPO | Admitting: Physician Assistant

## 2023-08-28 VITALS — Ht 67.0 in | Wt 157.0 lb

## 2023-08-28 DIAGNOSIS — Z0001 Encounter for general adult medical examination with abnormal findings: Secondary | ICD-10-CM

## 2023-08-28 DIAGNOSIS — Z0000 Encounter for general adult medical examination without abnormal findings: Secondary | ICD-10-CM

## 2023-08-28 DIAGNOSIS — Z5912 Inadequate housing utilities: Secondary | ICD-10-CM

## 2023-08-28 DIAGNOSIS — Z5941 Food insecurity: Secondary | ICD-10-CM | POA: Diagnosis not present

## 2023-08-28 DIAGNOSIS — Z1211 Encounter for screening for malignant neoplasm of colon: Secondary | ICD-10-CM

## 2023-08-28 NOTE — Patient Instructions (Addendum)
Barbara Anderson , Thank you for taking time to come for your Medicare Wellness Visit. I appreciate your ongoing commitment to your health goals. Please review the following plan we discussed and let me know if I can assist you in the future.   Referrals/Orders/Follow-Ups/Clinician Recommendations: I have placed a referral to GI to schedule a colonoscopy. Someone from their office should be calling you to schedule. I placed a referral to community resources to see if they can help you with utilities and food. Someone will call you within 30 days. You need to have a foot exam at your next OV on 09/03/23. I have requested records from your last eye exam from [facility].  This is a list of the screening recommended for you and due dates:  Health Maintenance  Topic Date Due   Complete foot exam   07/22/2019   Colon Cancer Screening  12/11/2022   COVID-19 Vaccine (1 - 2024-25 season) Never done   Hemoglobin A1C  11/11/2023   Eye exam for diabetics  05/02/2024   Yearly kidney function blood test for diabetes  05/12/2024   Yearly kidney health urinalysis for diabetes  05/20/2024   Mammogram  06/18/2024   Medicare Annual Wellness Visit  08/27/2024   Pap with HPV screening  06/16/2028   DTaP/Tdap/Td vaccine (2 - Td or Tdap) 06/16/2033   Pneumococcal Vaccination  Completed   Flu Shot  Completed   Hepatitis C Screening  Completed   HIV Screening  Completed   Zoster (Shingles) Vaccine  Completed   HPV Vaccine  Aged Out    Advanced directives: (ACP Link)Information on Advanced Care Planning can be found at [facility] of [place] Advance Health Care Directives Advance Health Care Directives ([email or web address]   Once you have completed the forms, please bring a copy of your health care power of attorney and living will to the office to be added to your chart at your convenience.   Next Medicare Annual Wellness Visit scheduled for next year: Yes, 09/02/24 @ 1:10pm (video visit)

## 2023-08-28 NOTE — Progress Notes (Cosign Needed)
Subjective:   Barbara Anderson is a 53 y.o. female who presents for Medicare Annual (Subsequent) preventive examination.  Visit Complete: Virtual I connected with  Barbara Anderson on 08/28/23 by a video and audio enabled telemedicine application and verified that I am speaking with the correct person using two identifiers.  Patient Location: Home  Provider Location: Home Office  I discussed the limitations of evaluation and management by telemedicine. The patient expressed understanding and agreed to proceed.  Vital Signs: Because this visit was a virtual/telehealth visit, some criteria may be missing or patient reported. Any vitals not documented were not able to be obtained and vitals that have been documented are patient reported.   Cardiac Risk Factors include: hypertension;diabetes mellitus;dyslipidemia     Objective:    Today's Vitals   08/28/23 1309  Weight: 157 lb (71.2 kg)  Height: 5\' 7"  (1.702 m)   Body mass index is 24.59 kg/m.     08/07/2022   10:35 AM 12/11/2019    7:44 AM 11/10/2019    9:56 AM 01/06/2019   11:21 AM 01/05/2019   11:31 PM 08/15/2018    8:46 PM 04/10/2017    6:35 PM  Advanced Directives  Does Patient Have a Medical Advance Directive? No No No No No No   Would patient like information on creating a medical advance directive? No - Patient declined  No - Patient declined No - Patient declined No - Patient declined No - Patient declined      Information is confidential and restricted. Go to Review Flowsheets to unlock data.     Current Medications (verified) Outpatient Encounter Medications as of 08/28/2023  Medication Sig   Adapalene (DIFFERIN) 0.3 % gel Apply 1 application topically at bedtime.   Alcohol Swabs (B-D SINGLE USE SWABS REGULAR) PADS USE TO CLEANSE SKIN EVERY DAY BEFORE CHECKING BLOOD SUGAR   atorvastatin (LIPITOR) 20 MG tablet TAKE ONE TABLET (20 MG TOTAL) BY MOUTH DAILY   Cholecalciferol (VITAMIN D3) 25 MCG (1000 UT) CAPS Take 1 capsule  (1,000 Units total) by mouth daily.   etonogestrel (NEXPLANON) 68 MG IMPL implant 1 each by Subdermal route once.   ferrous sulfate 325 (65 FE) MG tablet Take by mouth.   fluticasone (FLONASE) 50 MCG/ACT nasal spray USE 2 SPRAYS IN BOTH NOSTRILS  DAILY   folic acid (FOLVITE) 800 MCG tablet Take 1 tablet (800 mcg total) by mouth daily.   gabapentin (NEURONTIN) 300 MG capsule Take 1 capsule (300 mg total) by mouth at bedtime.   hydroquinone 2 % cream Apply topically at bedtime.   Lancets Misc. (ACCU-CHEK FASTCLIX LANCET) KIT To check blood sugar once daily   levothyroxine (SYNTHROID) 25 MCG tablet TAKE ONE TABLET (25 MCG TOTAL) BY MOUTH DAILY BEFORE BREAKFAST   lisinopril (ZESTRIL) 5 MG tablet TAKE ONE TABLET (5 MG TOTAL) BY MOUTH DAILY   metoprolol succinate (TOPROL-XL) 25 MG 24 hr tablet TAKE ONE TABLET (25 MG TOTAL) BY MOUTH DAILY   pregabalin (LYRICA) 200 MG capsule Take 1 capsule (200 mg total) by mouth 2 (two) times daily.   ramelteon (ROZEREM) 8 MG tablet TAKE ONE TABLET BY MOUTH EVERY DAY AT BEDTIME   spironolactone (ALDACTONE) 25 MG tablet TAKE ONE TABLET (25 MG TOTAL) BY MOUTH DAILY   tiZANidine (ZANAFLEX) 2 MG tablet Take 1 tablet (2 mg total) by mouth 3 (three) times daily as needed for muscle spasms.   Vitamin D, Ergocalciferol, (DRISDOL) 1.25 MG (50000 UNIT) CAPS capsule Take 1 capsule (50,000 Units total)  by mouth every 7 (seven) days. Take with meal.   ALPRAZolam (XANAX) 1 MG tablet Take 1 mg by mouth 2 (two) times daily as needed.  (Patient not taking: Reported on 08/28/2023)   amphetamine-dextroamphetamine (ADDERALL) 20 MG tablet Take 1 tablet (20 mg total) by mouth in the morning, at noon, and at bedtime. Has not started yet 11/10/19 (Patient not taking: Reported on 08/28/2023)   ARIPiprazole (ABILIFY) 5 MG tablet Take 5 mg by mouth daily. (Patient not taking: Reported on 08/28/2023)   DULoxetine (CYMBALTA) 60 MG capsule Take 2 capsules (120 mg total) by mouth daily. (Patient not  taking: Reported on 08/28/2023)   metoCLOPramide (REGLAN) 10 MG tablet Take by mouth. (Patient not taking: Reported on 08/28/2023)   metroNIDAZOLE (FLAGYL) 500 MG tablet Take 1 tablet (500 mg total) by mouth 2 (two) times daily. (Patient not taking: Reported on 08/28/2023)   Multiple Vitamin tablet Take 1 tablet by mouth daily.   QUEtiapine Fumarate (SEROQUEL XR) 150 MG 24 hr tablet Take 150 mg by mouth at bedtime.  (Patient not taking: Reported on 08/28/2023)   topiramate (TOPAMAX) 200 MG tablet Take 1 tablet (200 mg total) by mouth at bedtime. (Patient not taking: Reported on 08/28/2023)   traZODone (DESYREL) 150 MG tablet Take 1 tablet (150 mg total) by mouth at bedtime. (Patient not taking: Reported on 08/28/2023)   VRAYLAR 1.5 MG capsule Take 1.5 mg by mouth daily. (Patient not taking: Reported on 08/28/2023)   No facility-administered encounter medications on file as of 08/28/2023.    Allergies (verified) Metformin and related   History: Past Medical History:  Diagnosis Date   ADD (attention deficit disorder)    Anxiety    Back pain    Cholecystitis, acute 01/06/2019   Concussion 2001   head trauma-work related. Caused headaches and short term memory loss.   Depression    Diabetes mellitus without complication (HCC)    Overactive bladder    Recurrent vaginitis    Past Surgical History:  Procedure Laterality Date   ankle pin and plates placement     right   AUGMENTATION MAMMAPLASTY     BARIATRIC SURGERY  02/27/2022   CHOLECYSTECTOMY N/A 01/06/2019   Procedure: LAPAROSCOPIC CHOLECYSTECTOMY;  Surgeon: Ancil Linsey, MD;  Location: [facility] ORS;  Service: General;  Laterality: N/A;   COLONOSCOPY WITH PROPOFOL N/A 12/11/2019   Procedure: COLONOSCOPY WITH PROPOFOL;  Surgeon: Toney Reil, MD;  Location: [facility] ENDOSCOPY;  Service: Gastroenterology;  Laterality: N/A;   COSMETIC SURGERY  2005   BREASTS, ARM, ABDOMEN AFTER WEIGHT LOSS   Family History  Problem Relation Age of Onset    Diabetes Mother    Hypertension Mother    Depression Mother    Diabetes Father    Hypertension Father    Anxiety disorder Father    Depression Father    ADD / ADHD Father    Depression Sister    Bipolar disorder Sister    ADD / ADHD Brother    Breast cancer Neg Hx    Social History   Socioeconomic History   Marital status: Divorced    Spouse name: Not on file   Number of children: 0   Years of education: Not on file   Highest education level: Bachelor's degree (e.g., BA, AB, BS)  Occupational History   Occupation: disability  Tobacco Use   Smoking status: Never   Smokeless tobacco: Never  Vaping Use   Vaping status: Never Used  Substance and Sexual Activity  Alcohol use: No    Alcohol/week: 0.0 standard drinks of alcohol   Drug use: No   Sexual activity: Yes    Partners: Male    Birth control/protection: Implant  Other Topics Concern   Not on file  Social History Narrative   Not on file   Social Drivers of Health   Financial Resource Strain: High Risk (08/28/2023)   Overall Financial Resource Strain (CARDIA)    Difficulty of Paying Living Expenses: Very hard  Food Insecurity: Food Insecurity Present (08/28/2023)   Hunger Vital Sign    Worried About Running Out of Food in the Last Year: Sometimes true    Ran Out of Food in the Last Year: Sometimes true  Transportation Needs: No Transportation Needs (08/28/2023)   PRAPARE - Administrator, Civil Service (Medical): No    Lack of Transportation (Non-Medical): No  Physical Activity: Inactive (08/28/2023)   Exercise Vital Sign    Days of Exercise per Week: 0 days    Minutes of Exercise per Session: 0 min  Stress: No Stress Concern Present (08/28/2023)   [employer] of Occupational Health - Occupational Stress Questionnaire    Feeling of Stress : Not at all  Social Connections: Socially Isolated (08/28/2023)   Social Connection and Isolation Panel [NHANES]    Frequency of Communication with  Friends and Family: More than three times a week    Frequency of Social Gatherings with Friends and Family: Once a week    Attends Religious Services: Never    Database administrator or Organizations: No    Attends Engineer, structural: Never    Marital Status: Divorced    Tobacco Counseling Counseling given: Not Answered   Clinical Intake:  Pre-visit preparation completed: Yes  Pain : No/denies pain     BMI - recorded: 24.59 Nutritional Status: BMI of 19-24  Normal Nutritional Risks: None Diabetes: Yes CBG done?: No (FBS 85 per patient) Did pt. bring in CBG monitor from home?: No  How often do you need to have someone help you when you read instructions, pamphlets, or other written materials from your doctor or pharmacy?: 1 - Never  Interpreter Needed?: No  Information entered by :: Tora Kindred, CMA   Activities of Daily Living    08/28/2023    1:12 PM  In your present state of health, do you have any difficulty performing the following activities:  Hearing? 0  Vision? 0  Difficulty concentrating or making decisions? 1  Comment "concentration problems at times"  Walking or climbing stairs? 1  Comment uses a cane  Dressing or bathing? 0  Doing errands, shopping? 0  Preparing Food and eating ? N  Using the Toilet? N  In the past six months, have you accidently leaked urine? N  Do you have problems with loss of bowel control? N  Managing your Medications? N  Managing your Finances? N  Housekeeping or managing your Housekeeping? N    Patient Care Team: Debera Lat, PA-C as PCP - General (Physician Assistant) Zena Amos, MD as Consulting Physician (Psychiatry) Lamont Dowdy, MD as Consulting Physician (Nephrology) Fabienne Bruns, MD as Referring Physician (Anesthesiology) Gaspar Cola, [facility] (Inactive) as Pharmacist (Pharmacist) Pa, [facility] (Optometry)  Indicate any recent Medical Services you may have received from other  than [facility] providers in the past year (date may be approximate).     Assessment:   This is a routine wellness examination for Barbara Anderson.  Hearing/Vision screen Hearing  Screening - Comments:: Denies hearing loss Vision Screening - Comments:: Gets eye exams, [facility], requested exam from 2024   Goals Addressed             This Visit's Progress    Patient Stated       Exercise more      Depression Screen    08/28/2023    1:25 PM 08/13/2023    1:10 PM 06/21/2023    8:20 AM 05/21/2023   11:04 AM 10/19/2022    1:20 PM 08/07/2022   10:33 AM 04/20/2022    1:07 PM  PHQ 2/9 Scores  PHQ - 2 Score 0 0 0 1 1 0 5  PHQ- 9 Score 0 0 2 3 12  0 15    Fall Risk    08/28/2023    1:23 PM 08/13/2023    1:09 PM 06/21/2023    8:20 AM 05/21/2023   11:04 AM 10/30/2022    2:23 PM  Fall Risk   Falls in the past year? 0 0 1 1 1   Number falls in past yr: 0 0 1 1 1   Comment     7-8 falls in the past year  Injury with Fall? 0 0 0 0 0  Risk for fall due to : No Fall Risks  Impaired balance/gait Impaired balance/gait;Impaired vision   Follow up Falls prevention discussed        MEDICARE RISK AT HOME: Medicare Risk at Home Any stairs in or around the home?: Yes If so, are there any without handrails?: No Home free of loose throw rugs in walkways, pet beds, electrical cords, etc?: Yes Adequate lighting in your home to reduce risk of falls?: Yes Life alert?: No Use of a cane, walker or w/c?: No Grab bars in the bathroom?: Yes Shower chair or bench in shower?: Yes Elevated toilet seat or a handicapped toilet?: Yes  TIMED UP AND GO:  Was the test performed?  No    Cognitive Function:        08/28/2023    1:30 PM 08/07/2022   10:37 AM 04/28/2021   12:56 PM  6CIT Screen  What Year? 0 points 0 points 0 points  What month? 0 points 0 points 0 points  What time? 0 points 0 points 0 points  Count back from 20 2 points 0 points 2 points  Months in reverse 4 points 0 points 4 points  Repeat  phrase 2 points 0 points 8 points  Total Score 8 points 0 points 14 points    Immunizations Immunization History  Administered Date(s) Administered   Influenza, Seasonal, Injecte, Preservative Fre 05/21/2023   Influenza,inj,Quad PF,6+ Mos 05/09/2017, 05/21/2018, 05/12/2021, 04/20/2022   PNEUMOCOCCAL CONJUGATE-20 07/26/2021   Pneumococcal Polysaccharide-23 07/21/2018   Tdap 06/17/2023   Zoster Recombinant(Shingrix) 07/26/2021, 10/26/2021    TDAP status: Up to date  Flu Vaccine status: Up to date  Pneumococcal vaccine status: Up to date  Covid-19 vaccine status: Declined, Education has been provided regarding the importance of this vaccine but patient still declined. Advised may receive this vaccine at local pharmacy or Health Dept.or vaccine clinic. Aware to provide a copy of the vaccination record if obtained from local pharmacy or Health Dept. Verbalized acceptance and understanding.  Qualifies for Shingles Vaccine? Yes   Zostavax completed No   Shingrix Completed?: Yes  Screening Tests Health Maintenance  Topic Date Due   FOOT EXAM  07/22/2019   Colonoscopy  12/10/2020   COVID-19 Vaccine (1 - 2024-25 season) Never  done   HEMOGLOBIN A1C  11/11/2023   OPHTHALMOLOGY EXAM  05/02/2024   Diabetic kidney evaluation - eGFR measurement  05/12/2024   Diabetic kidney evaluation - Urine ACR  05/20/2024   MAMMOGRAM  06/18/2024   Medicare Annual Wellness (AWV)  08/27/2024   Cervical Cancer Screening (HPV/Pap Cotest)  06/16/2028   DTaP/Tdap/Td (2 - Td or Tdap) 06/16/2033   Pneumococcal Vaccine 58-30 Years old  Completed   INFLUENZA VACCINE  Completed   Hepatitis C Screening  Completed   HIV Screening  Completed   Zoster Vaccines- Shingrix  Completed   HPV VACCINES  Aged Out    Health Maintenance  Health Maintenance Due  Topic Date Due   FOOT EXAM  07/22/2019   Colonoscopy  12/10/2020   COVID-19 Vaccine (1 - 2024-25 season) Never done    Colorectal cancer screening:  Referral to GI placed 08/28/23. Pt aware the office will call re: appt.  Mammogram status: Completed 05/19/23. Repeat every year   Lung Cancer Screening: (Low Dose CT Chest recommended if Age 34-80 years, 20 pack-year currently smoking OR have quit w/in 15years.) does not qualify.   Lung Cancer Screening Referral: n/a  Additional Screening:  Hepatitis C Screening: does not qualify; Completed 07/08/19  Vision Screening: Recommended annual ophthalmology exams for early detection of glaucoma and other disorders of the eye. Is the patient up to date with their annual eye exam?  Yes , last exam 2024 per patient. Requested most recent exam Who is the provider or what is the name of the office in which the patient attends annual eye exams? [facility] If pt is not established with a provider, would they like to be referred to a provider to establish care? No .   Dental Screening: Recommended annual dental exams for proper oral hygiene  Diabetic Foot Exam: Diabetic Foot Exam: Overdue, Pt has been advised about the importance in completing this exam. Pt is scheduled for diabetic foot exam on 09/03/23.  Community Resource Referral / Chronic Care Management: CRR required this visit?  Yes   CCM required this visit?  No     Plan:     I have personally reviewed and noted the following in the patient's chart:   Medical and social history Use of alcohol, tobacco or illicit drugs  Current medications and supplements including opioid prescriptions. Patient is not currently taking opioid prescriptions. Functional ability and status Nutritional status Physical activity Advanced directives List of other physicians Hospitalizations, surgeries, and ER visits in previous 12 months Vitals Screenings to include cognitive, depression, and falls Referrals and appointments  In addition, I have reviewed and discussed with patient certain preventive protocols, quality metrics, and best practice  recommendations. A written personalized care plan for preventive services as well as general preventive health recommendations were provided to patient.     Tora Kindred, CMA   08/28/2023   After Visit Summary: (MyChart) Due to this being a telephonic visit, the after visit summary with patients personalized plan was offered to patient via MyChart   Nurse Notes: 6 CIT Score - 8   Placed VBCI referral for food and utilities insecurity. Placed referral to GI for colonoscopy Requested last DM eye exam from [facility] (done 2024 per patient) Needs DM foot exam at next OV on 09/03/23 Declined DM & Nutrition education (pt states she is not diabetic, but preDM) Declined covid vaccine

## 2023-08-29 ENCOUNTER — Other Ambulatory Visit: Payer: Self-pay | Admitting: Physician Assistant

## 2023-08-29 ENCOUNTER — Telehealth: Payer: Self-pay

## 2023-08-29 ENCOUNTER — Other Ambulatory Visit: Payer: Self-pay

## 2023-08-29 ENCOUNTER — Telehealth: Payer: Self-pay | Admitting: *Deleted

## 2023-08-29 DIAGNOSIS — Z860100 Personal history of colon polyps, unspecified: Secondary | ICD-10-CM

## 2023-08-29 MED ORDER — NA SULFATE-K SULFATE-MG SULF 17.5-3.13-1.6 GM/177ML PO SOLN: 1.0000 | Freq: Once | ORAL | 0 refills | Status: AC

## 2023-08-29 NOTE — Telephone Encounter (Signed)
Gastroenterology Pre-Procedure Review  Request Date: 09/18/23 Requesting Physician: Dr. Allegra Lai  PATIENT REVIEW QUESTIONS: The patient responded to the following health history questions as indicated:    1. Are you having any GI issues? no 2. Do you have a personal history of Polyps? yes (last colonoscopy performed by Dr. Allegra Lai 12/11/19) 3. Do you have a family history of Colon Cancer or Polyps? no 4. Diabetes Mellitus? no 5. Joint replacements in the past 12 months?no 6. Major health problems in the past 3 months?no 7. Any artificial heart valves, MVP, or defibrillator?no    MEDICATIONS & ALLERGIES:    Patient reports the following regarding taking any anticoagulation/antiplatelet therapy:   Plavix, Coumadin, Eliquis, Xarelto, Lovenox, Pradaxa, Brilinta, or Effient? no Aspirin? no  Patient confirms/reports the following medications:  Current Outpatient Medications  Medication Sig Dispense Refill   Na Sulfate-K Sulfate-Mg Sulfate concentrate 17.5-3.13-1.6 GM/177ML SOLN Take 1 kit by mouth once for 1 dose. 354 mL 0   Adapalene (DIFFERIN) 0.3 % gel Apply 1 application topically at bedtime. 45 g 3   Alcohol Swabs (B-D SINGLE USE SWABS REGULAR) PADS USE TO CLEANSE SKIN EVERY DAY BEFORE CHECKING BLOOD SUGAR 100 each 1   ALPRAZolam (XANAX) 1 MG tablet Take 1 mg by mouth 2 (two) times daily as needed.  (Patient not taking: Reported on 08/28/2023)     amphetamine-dextroamphetamine (ADDERALL) 20 MG tablet Take 1 tablet (20 mg total) by mouth in the morning, at noon, and at bedtime. Has not started yet 11/10/19 (Patient not taking: Reported on 08/28/2023)     ARIPiprazole (ABILIFY) 5 MG tablet Take 5 mg by mouth daily. (Patient not taking: Reported on 08/28/2023)     atorvastatin (LIPITOR) 20 MG tablet TAKE ONE TABLET (20 MG TOTAL) BY MOUTH DAILY 100 tablet 0   Cholecalciferol (VITAMIN D3) 25 MCG (1000 UT) CAPS Take 1 capsule (1,000 Units total) by mouth daily. 90 capsule 0   DULoxetine (CYMBALTA) 60  MG capsule Take 2 capsules (120 mg total) by mouth daily. (Patient not taking: Reported on 08/28/2023) 60 capsule 0   etonogestrel (NEXPLANON) 68 MG IMPL implant 1 each by Subdermal route once.     ferrous sulfate 325 (65 FE) MG tablet Take by mouth.     fluticasone (FLONASE) 50 MCG/ACT nasal spray USE 2 SPRAYS IN BOTH NOSTRILS  DAILY 64 g 0   folic acid (FOLVITE) 800 MCG tablet Take 1 tablet (800 mcg total) by mouth daily. 90 tablet 3   gabapentin (NEURONTIN) 300 MG capsule Take 1 capsule (300 mg total) by mouth at bedtime. 100 capsule 3   hydroquinone 2 % cream Apply topically at bedtime. 70 g 1   Lancets Misc. (ACCU-CHEK FASTCLIX LANCET) KIT To check blood sugar once daily 1 kit 0   levothyroxine (SYNTHROID) 25 MCG tablet TAKE ONE TABLET (25 MCG TOTAL) BY MOUTH DAILY BEFORE BREAKFAST 100 tablet 0   lisinopril (ZESTRIL) 5 MG tablet TAKE ONE TABLET (5 MG TOTAL) BY MOUTH DAILY 100 tablet 0   metoCLOPramide (REGLAN) 10 MG tablet Take by mouth. (Patient not taking: Reported on 08/28/2023)     metoprolol succinate (TOPROL-XL) 25 MG 24 hr tablet TAKE ONE TABLET (25 MG TOTAL) BY MOUTH DAILY 100 tablet 0   metroNIDAZOLE (FLAGYL) 500 MG tablet Take 1 tablet (500 mg total) by mouth 2 (two) times daily. (Patient not taking: Reported on 08/28/2023) 14 tablet 0   Multiple Vitamin tablet Take 1 tablet by mouth daily.     pregabalin (LYRICA) 200  MG capsule Take 1 capsule (200 mg total) by mouth 2 (two) times daily. 200 capsule 3   QUEtiapine Fumarate (SEROQUEL XR) 150 MG 24 hr tablet Take 150 mg by mouth at bedtime.  (Patient not taking: Reported on 08/28/2023)     ramelteon (ROZEREM) 8 MG tablet TAKE ONE TABLET BY MOUTH EVERY DAY AT BEDTIME 90 tablet 0   spironolactone (ALDACTONE) 25 MG tablet TAKE ONE TABLET (25 MG TOTAL) BY MOUTH DAILY 100 tablet 0   tiZANidine (ZANAFLEX) 2 MG tablet Take 1 tablet (2 mg total) by mouth 3 (three) times daily as needed for muscle spasms. 30 tablet 1   topiramate (TOPAMAX) 200 MG  tablet Take 1 tablet (200 mg total) by mouth at bedtime. (Patient not taking: Reported on 08/28/2023) 30 tablet 1   traZODone (DESYREL) 150 MG tablet Take 1 tablet (150 mg total) by mouth at bedtime. (Patient not taking: Reported on 08/28/2023) 30 tablet 1   Vitamin D, Ergocalciferol, (DRISDOL) 1.25 MG (50000 UNIT) CAPS capsule Take 1 capsule (50,000 Units total) by mouth every 7 (seven) days. Take with meal. 26 capsule 0   VRAYLAR 1.5 MG capsule Take 1.5 mg by mouth daily. (Patient not taking: Reported on 08/28/2023)     No current facility-administered medications for this visit.    Patient confirms/reports the following allergies:  Allergies  Allergen Reactions   Metformin And Related Diarrhea    No orders of the defined types were placed in this encounter.   AUTHORIZATION INFORMATION Primary Insurance: 1D#: Group #:  Secondary Insurance: 1D#: Group #:  SCHEDULE INFORMATION: Date: 09/18/23 Time: Location: [facility]

## 2023-08-29 NOTE — Telephone Encounter (Signed)
Medication Refill -  Most Recent Primary Care Visit:  Provider: Tora Kindred  Department: [facility]  Visit Type: MEDICARE AWV, SEQUENTIAL  Date: 08/28/2023  Medication: ALPRAZolam (XANAX) 1 MG tablet [[id number]] amphetamine-dextroamphetamine (ADDERALL) 20 MG tablet [[id number]] DULoxetine (CYMBALTA) 60 MG capsule [[id number]]   Has the patient contacted their pharmacy? Yes  (Agent: If yes, when and what did the pharmacy advise?) contact office  Is this the correct pharmacy for this prescription? Yes  This is the patient's preferred pharmacy:  [facility] [address] [place] - [address] [place] [address] Phone: [phone] Fax: [phone]   Has the prescription been filled recently? Yes  Is the patient out of the medication? Yes  Has the patient been seen for an appointment in the last year OR does the patient have an upcoming appointment? Yes  Can we respond through MyChart? Yes  Agent: Please be advised that Rx refills may take up to 3 business days. We ask that you follow-up with your pharmacy.

## 2023-08-29 NOTE — Progress Notes (Signed)
Complex Care Management Note Care Guide Note  08/29/2023 Name: Barbara Anderson MRN: [id number] DOB: 04-Sep-1970   Complex Care Management Outreach Attempts: An unsuccessful telephone outreach was attempted today to offer the patient information about available complex care management services.  Follow Up Plan:  Additional outreach attempts will be made to offer the patient complex care management information and services.   Encounter Outcome:  No Answer  Burman Nieves, CMA, Care Guide [facility]  [facility], [facility] Guide Direct Dial: [phone]  Fax: [phone] Website: [email or web address]

## 2023-08-30 NOTE — Telephone Encounter (Signed)
Requested medication (s) are due for refill today - unknown  Requested medication (s) are on the active medication list -yes  Future visit scheduled -yes  Last refill: all medications are historical, outside provider, expired  Notes to clinic: non delegated Rx or expired Rx  Requested Prescriptions  Pending Prescriptions Disp Refills   ALPRAZolam (XANAX) 1 MG tablet 30 tablet     Sig: Take 1 tablet (1 mg total) by mouth 2 (two) times daily as needed.     Not Delegated - Psychiatry: Anxiolytics/Hypnotics 2 Failed - 08/30/2023 10:25 AM      Failed - This refill cannot be delegated      Failed - Urine Drug Screen completed in last 360 days      Passed - Patient is not pregnant      Passed - Valid encounter within last 6 months    Recent Outpatient Visits           2 weeks ago Other hemorrhoids   [place] [place] [place], [place], PA-C   2 months ago UTI symptoms   [name], [facility]   3 months ago UTI symptoms   [facility] Merita Norton T, FNP   10 months ago Traumatic brain injury with loss of consciousness, sequela [facility])   [place] [facility] Merita Norton T, FNP   10 months ago Traumatic brain injury with loss of consciousness, sequela [facility])   [place] [facility] Merita Norton T, FNP       Future Appointments             In 4 days Debera Lat, PA-C [place] [facility], [facility]   In 1 week [place], [place], PA-C [place] [facility], [facility]             amphetamine-dextroamphetamine (ADDERALL) 20 MG tablet 90 tablet 0    Sig: Take 1 tablet (20 mg total) by mouth in the morning, at noon, and at bedtime. Has not started yet 11/10/19     Not Delegated - Psychiatry:  Stimulants/ADHD Failed - 08/30/2023 10:25 AM      Failed - This refill cannot be delegated      Failed - Urine Drug Screen completed in last 360 days       Passed - Last BP in normal range    BP Readings from Last 1 Encounters:  08/13/23 127/84         Passed - Last Heart Rate in normal range    Pulse Readings from Last 1 Encounters:  08/13/23 63         Passed - Valid encounter within last 6 months    Recent Outpatient Visits           2 weeks ago Other hemorrhoids   [place] [facility] [place], [place], PA-C   2 months ago UTI symptoms   [facility] Limes, [facility]   3 months ago UTI symptoms   [facility] Merita Norton T, FNP   10 months ago Traumatic brain injury with loss of consciousness, sequela [facility])   [place] [facility] Merita Norton T, FNP   10 months ago Traumatic brain injury with loss of consciousness, sequela [facility])   [facility] Jacky Kindle, FNP       Future Appointments  In 4 days [place], [place], PA-C [place] [facility], [facility]   In 1 week Roberta, [place], PA-C [place] [facility], [facility]             DULoxetine (CYMBALTA) 60 MG capsule 60 capsule 0    Sig: Take 2 capsules (120 mg total) by mouth daily.     Psychiatry: Antidepressants - SNRI - duloxetine Passed - 08/30/2023 10:25 AM      Passed - Cr in normal range and within 360 days    Creatinine  Date Value Ref Range Status  05/13/2023 1.0 0.5 - 1.1 Final  05/12/2014 0.88 0.60 - 1.30 mg/dL Final   Creat  Date Value Ref Range Status  04/22/2014 0.79 0.50 - 1.10 mg/dL Final   Creatinine, Ser  Date Value Ref Range Status  01/26/2022 1.10 (H) 0.57 - 1.00 mg/dL Final         Passed - eGFR is 30 or above and within 360 days    EGFR (African American)  Date Value Ref Range Status  10/21/2013 >60  Final   GFR calc Af Amer  Date Value Ref Range Status  02/04/2020 76 >59 mL/min/1.73 Final    Comment:    **[facility] currently reports eGFR in compliance with the current**    recommendations of the [employer]. [facility] will   update reporting as new guidelines are published from the NKF-ASN   Task force.    EGFR (Non-African Amer.)  Date Value Ref Range Status  10/21/2013 >60  Final    Comment:    eGFR values <26mL/min/1.73 m2 may be an indication of chronic kidney disease (CKD). Calculated eGFR is useful in patients with stable renal function. The eGFR calculation will not be reliable in acutely ill patients when serum creatinine is changing rapidly. It is not useful in  patients on dialysis. The eGFR calculation may not be applicable to patients at the low and high extremes of body sizes, pregnant women, and vegetarians.    GFR calc non Af Amer  Date Value Ref Range Status  02/04/2020 66 >59 mL/min/1.73 Final   eGFR  Date Value Ref Range Status  05/13/2023 72  Final  01/26/2022 60 >59 mL/min/1.73 Final         Passed - Completed PHQ-2 or PHQ-9 in the last 360 days      Passed - Last BP in normal range    BP Readings from Last 1 Encounters:  08/13/23 127/84         Passed - Valid encounter within last 6 months    Recent Outpatient Visits           2 weeks ago Other hemorrhoids   [place] [facility] [place], [place], PA-C   2 months ago UTI symptoms   [facility], [facility]   3 months ago UTI symptoms   [facility], FNP   10 months ago Traumatic brain injury with loss of consciousness, sequela [facility])   [place] [facility] Merita Norton T, FNP   10 months ago Traumatic brain injury with loss of consciousness, sequela [facility])   [place] [facility] Jacky Kindle, FNP       Future Appointments             In 4 days Debera Lat, PA-C [facility], [facility]   In 1 week Debera Lat, PA-C [facility], [facility]               Requested  Prescriptions  Pending Prescriptions Disp Refills   ALPRAZolam (XANAX) 1 MG tablet 30 tablet     Sig: Take 1 tablet (1 mg total) by mouth 2 (two) times daily as needed.     Not Delegated - Psychiatry: Anxiolytics/Hypnotics 2 Failed - 08/30/2023 10:25 AM      Failed - This refill cannot be delegated      Failed - Urine Drug Screen completed in last 360 days      Passed - Patient is not pregnant      Passed - Valid encounter within last 6 months    Recent Outpatient Visits           2 weeks ago Other hemorrhoids   [place] [facility] [place], [place], PA-C   2 months ago UTI symptoms   [facility], MD   3 months ago UTI symptoms   [facility], FNP   10 months ago Traumatic brain injury with loss of consciousness, sequela [facility])   [place] [facility] Merita Norton T, FNP   10 months ago Traumatic brain injury with loss of consciousness, sequela [facility])   [place] [facility] Merita Norton T, FNP       Future Appointments             In 4 days Debera Lat, PA-C [facility], [facility]   In 1 week [place], [place], PA-C [place] [facility], [facility]             amphetamine-dextroamphetamine (ADDERALL) 20 MG tablet 90 tablet 0    Sig: Take 1 tablet (20 mg total) by mouth in the morning, at noon, and at bedtime. Has not started yet 11/10/19     Not Delegated - Psychiatry:  Stimulants/ADHD Failed - 08/30/2023 10:25 AM      Failed - This refill cannot be delegated      Failed - Urine Drug Screen completed in last 360 days      Passed - Last BP in normal range    BP Readings from Last 1 Encounters:  08/13/23 127/84         Passed - Last Heart Rate in normal range    Pulse Readings from Last 1 Encounters:  08/13/23 63         Passed - Valid encounter within last 6 months    Recent Outpatient Visits            2 weeks ago Other hemorrhoids   [place] [facility] [place], PA-C   2 months ago UTI symptoms   [facility], MD   3 months ago UTI symptoms   [facility], FNP   10 months ago Traumatic brain injury with loss of consciousness, sequela [facility])   [place] Merita Norton T, FNP   10 months ago Traumatic brain injury with loss of consciousness, sequela [facility])   [place] [facility] Jacky Kindle, FNP       Future Appointments             In 4 days Debera Lat, PA-C [facility], [facility]   In 1 week Debera Lat, PA-C [facility], [facility]             DULoxetine (CYMBALTA) 60 MG capsule 60 capsule 0    Sig: Take 2 capsules (120 mg total) by mouth daily.     Psychiatry: Antidepressants - SNRI - duloxetine Passed - 08/30/2023 10:25 AM      Passed - Cr in normal range and within 360 days    Creatinine  Date Value Ref Range Status  05/13/2023 1.0 0.5 - 1.1 Final  05/12/2014 0.88 0.60 - 1.30 mg/dL Final   Creat  Date Value Ref Range Status  04/22/2014 0.79 0.50 - 1.10 mg/dL Final   Creatinine, Ser  Date Value Ref Range Status  01/26/2022 1.10 (H) 0.57 - 1.00 mg/dL Final         Passed - eGFR is 30 or above and within 360 days    EGFR (African American)  Date Value Ref Range Status  10/21/2013 >60  Final   GFR calc Af Amer  Date Value Ref Range Status  02/04/2020 76 >59 mL/min/1.73 Final    Comment:    **[facility] currently reports eGFR in compliance with the current**   recommendations of the [employer]. [facility] will   update reporting as new guidelines are published from the NKF-ASN   Task force.    EGFR (Non-African Amer.)  Date Value Ref Range Status  10/21/2013 >60  Final    Comment:    eGFR values <58mL/min/1.73 m2 may be an  indication of chronic kidney disease (CKD). Calculated eGFR is useful in patients with stable renal function. The eGFR calculation will not be reliable in acutely ill patients when serum creatinine is changing rapidly. It is not useful in  patients on dialysis. The eGFR calculation may not be applicable to patients at the low and high extremes of body sizes, pregnant women, and vegetarians.    GFR calc non Af Amer  Date Value Ref Range Status  02/04/2020 66 >59 mL/min/1.73 Final   eGFR  Date Value Ref Range Status  05/13/2023 72  Final  01/26/2022 60 >59 mL/min/1.73 Final         Passed - Completed PHQ-2 or PHQ-9 in the last 360 days      Passed - Last BP in normal range    BP Readings from Last 1 Encounters:  08/13/23 127/84         Passed - Valid encounter within last 6 months    Recent Outpatient Visits           2 weeks ago Other hemorrhoids   [place] [facility] [place], [place], PA-C   2 months ago UTI symptoms   [facility]   3 months ago UTI symptoms   [facility] Merita Norton T, FNP   10 months ago Traumatic brain injury with loss of consciousness, sequela [facility])   [place] [facility] Merita Norton T, FNP   10 months ago Traumatic brain injury with loss of consciousness, sequela [facility])   [place] Jacky Kindle, FNP       Future Appointments             In 4 days Debera Lat, PA-C [facility] [place] [place], [facility]   In 1 week Debera Lat, PA-C [facility], [facility]

## 2023-08-30 NOTE — Progress Notes (Signed)
Complex Care Management Note Care Guide Note  08/30/2023 Name: Barbara Anderson MRN: [id number] DOB: 20-May-1971   Complex Care Management Outreach Attempts: A second unsuccessful outreach was attempted today to offer the patient with information about available complex care management services.  Follow Up Plan:  Additional outreach attempts will be made to offer the patient complex care management information and services.   Encounter Outcome:  No Answer  Burman Nieves, CMA, Care Guide [facility]  [facility] Guide Direct Dial: [phone]  Fax: [phone] Website: [email or web address]

## 2023-09-02 NOTE — Progress Notes (Signed)
Complex Care Management Note  Care Guide Note 09/02/2023 Name: Barbara Anderson MRN: [id number] DOB: [birth year]  Barbara Anderson is a 53 y.o. year old female who sees [place], [place] for primary care. I reached out to Louie Boston by phone today to offer complex care management services.  [name] was given information about Complex Care Management services today including:   The Complex Care Management services include support from the care team which includes your Nurse Coordinator, Clinical Social Worker, or Pharmacist.  The Complex Care Management team is here to help remove barriers to the health concerns and goals most important to you. Complex Care Management services are voluntary, and the patient may decline or stop services at any time by request to their care team member.   Complex Care Management Consent Status: Patient agreed to services and verbal consent obtained.   Follow up plan:  Telephone appointment with complex care management team member scheduled for:  09/11/2023  Encounter Outcome:  Patient Scheduled  Burman Nieves, CMA, Care Guide [facility]  [facility], [facility] Guide Direct Dial: [phone]  Fax: [phone] Website: [email or web address]

## 2023-09-03 ENCOUNTER — Encounter: Payer: Self-pay | Admitting: Physician Assistant

## 2023-09-03 ENCOUNTER — Ambulatory Visit (INDEPENDENT_AMBULATORY_CARE_PROVIDER_SITE_OTHER): Payer: Medicare PPO | Admitting: Physician Assistant

## 2023-09-03 VITALS — BP 103/68 | HR 54 | Resp 16 | Ht 67.0 in | Wt 158.9 lb

## 2023-09-03 DIAGNOSIS — R809 Proteinuria, unspecified: Secondary | ICD-10-CM

## 2023-09-03 DIAGNOSIS — R41840 Attention and concentration deficit: Secondary | ICD-10-CM | POA: Diagnosis not present

## 2023-09-03 DIAGNOSIS — E782 Mixed hyperlipidemia: Secondary | ICD-10-CM | POA: Insufficient documentation

## 2023-09-03 DIAGNOSIS — G894 Chronic pain syndrome: Secondary | ICD-10-CM

## 2023-09-03 DIAGNOSIS — F422 Mixed obsessional thoughts and acts: Secondary | ICD-10-CM

## 2023-09-03 DIAGNOSIS — I959 Hypotension, unspecified: Secondary | ICD-10-CM | POA: Insufficient documentation

## 2023-09-03 DIAGNOSIS — F419 Anxiety disorder, unspecified: Secondary | ICD-10-CM

## 2023-09-03 DIAGNOSIS — F4310 Post-traumatic stress disorder, unspecified: Secondary | ICD-10-CM | POA: Diagnosis not present

## 2023-09-03 DIAGNOSIS — I9589 Other hypotension: Secondary | ICD-10-CM | POA: Diagnosis not present

## 2023-09-03 DIAGNOSIS — F4001 Agoraphobia with panic disorder: Secondary | ICD-10-CM | POA: Diagnosis not present

## 2023-09-03 DIAGNOSIS — Z5912 Inadequate housing utilities: Secondary | ICD-10-CM | POA: Insufficient documentation

## 2023-09-03 DIAGNOSIS — Z5941 Food insecurity: Secondary | ICD-10-CM | POA: Diagnosis not present

## 2023-09-03 DIAGNOSIS — R5383 Other fatigue: Secondary | ICD-10-CM | POA: Insufficient documentation

## 2023-09-03 DIAGNOSIS — E1129 Type 2 diabetes mellitus with other diabetic kidney complication: Secondary | ICD-10-CM

## 2023-09-03 NOTE — Progress Notes (Signed)
 Established patient visit  Patient: Barbara Anderson   DOB: [birth year]   53 y.o. Female  MRN: [id number] Visit Date: 09/03/2023  Today's healthcare provider: Jolynn Spencer, PA-C   Chief Complaint  Patient presents with   Annual Exam    Patient had AWV 08/28/2023   Subjective     Discussed the use of AI scribe software for clinical note transcription with the patient, who gave verbal consent to proceed.  History of Present Illness        Hypertension, follow-up  BP Readings from Last 3 Encounters:  09/03/23 103/68  08/13/23 127/84  07/19/23 128/81   Wt Readings from Last 3 Encounters:  09/03/23 158 lb 14.4 oz (72.1 kg)  08/28/23 157 lb (71.2 kg)  08/13/23 162 lb (73.5 kg)     She was last seen for hypertension 2 weeks ago.  BP at that visit was 127/84. Management since that visit includes lisinopril  5, metoprolol  25, spirinolactone 25.  She reports fair compliance with treatment. She is having side effects. Today her first reading was 95/52 and feels fatigued. Shehas a food insecurity.   Outside blood pressures are varies Symptoms: No chest pain No chest pressure  No palpitations No syncope  No dyspnea No orthopnea  No paroxysmal nocturnal dyspnea No lower extremity edema   Pertinent labs Lab Results  Component Value Date   CHOL 193 05/13/2023   HDL 44 05/13/2023   LDLCALC 131 05/13/2023   TRIG 98 05/13/2023   CHOLHDL 4.3 01/26/2022   Lab Results  Component Value Date   NA 143 05/13/2023   K 4.3 01/26/2022   CREATININE 1.0 05/13/2023   EGFR 72 05/13/2023   GLUCOSE 120 (H) 01/26/2022   TSH 1.410 11/28/2021     The 10-year ASCVD risk score (Arnett DK, et al., 2019) is: 3.6%* (Cholesterol units were assumed)  ---------------------------------------------------------------------------------------------------        08/28/2023    1:25 PM 08/13/2023    1:10 PM 06/21/2023    8:20 AM  Depression screen PHQ 2/9  Decreased Interest 0 0 0  Down, Depressed,  Hopeless 0 0 0  PHQ - 2 Score 0 0 0  Altered sleeping 0 0 1  Tired, decreased energy 0 0 0  Change in appetite 0 0 0  Feeling bad or failure about yourself  0 0 0  Trouble concentrating 0 0 1  Moving slowly or fidgety/restless 0 0 0  Suicidal thoughts 0 0 0  PHQ-9 Score 0 0 2  Difficult doing work/chores Not difficult at all Not difficult at all Not difficult at all      06/21/2023    8:20 AM 05/21/2023   11:04 AM 01/11/2021   11:44 AM 07/08/2019    2:00 PM  GAD 7 : Generalized Anxiety Score  Nervous, Anxious, on Edge 0 0 3 0  Control/stop worrying 1 1 3  0  Worry too much - different things 1 1 3 1   Trouble relaxing 0 1 3 1   Restless  0 2 0  Easily annoyed or irritable 0 0 3 1  Afraid - awful might happen 1 1 1  0  Total GAD 7 Score  4 18 3   Anxiety Difficulty Not difficult at all Somewhat difficult Extremely difficult Somewhat difficult    Medications: Outpatient Medications Prior to Visit  Medication Sig   Adapalene  (DIFFERIN ) 0.3 % gel Apply 1 application topically at bedtime.   Alcohol Swabs (B-D SINGLE USE SWABS REGULAR) PADS USE TO CLEANSE SKIN EVERY DAY BEFORE  CHECKING BLOOD SUGAR   atorvastatin  (LIPITOR) 20 MG tablet TAKE ONE TABLET (20 MG TOTAL) BY MOUTH DAILY   etonogestrel  (NEXPLANON ) 68 MG IMPL implant 1 each by Subdermal route once.   ferrous sulfate 325 (65 FE) MG tablet Take by mouth.   fluticasone  (FLONASE ) 50 MCG/ACT nasal spray USE 2 SPRAYS IN BOTH NOSTRILS  DAILY   folic acid  (FOLVITE ) 800 MCG tablet Take 1 tablet (800 mcg total) by mouth daily.   gabapentin  (NEURONTIN ) 300 MG capsule Take 1 capsule (300 mg total) by mouth at bedtime.   hydroquinone  2 % cream Apply topically at bedtime.   Lancets Misc. (ACCU-CHEK FASTCLIX LANCET) KIT To check blood sugar once daily   levothyroxine  (SYNTHROID ) 25 MCG tablet TAKE ONE TABLET (25 MCG TOTAL) BY MOUTH DAILY BEFORE BREAKFAST   lisinopril  (ZESTRIL ) 5 MG tablet TAKE ONE TABLET (5 MG TOTAL) BY MOUTH DAILY    metoprolol  succinate (TOPROL -XL) 25 MG 24 hr tablet TAKE ONE TABLET (25 MG TOTAL) BY MOUTH DAILY   Multiple Vitamin tablet Take 1 tablet by mouth daily.   omeprazole (PRILOSEC) 40 MG capsule Take 40 mg by mouth daily.   OZEMPIC , 1 MG/DOSE, 4 MG/3ML SOPN Inject 1 mg into the skin once a week.   pregabalin  (LYRICA ) 200 MG capsule Take 1 capsule (200 mg total) by mouth 2 (two) times daily.   ramelteon  (ROZEREM ) 8 MG tablet TAKE ONE TABLET BY MOUTH EVERY DAY AT BEDTIME   spironolactone  (ALDACTONE ) 25 MG tablet TAKE ONE TABLET (25 MG TOTAL) BY MOUTH DAILY   tiZANidine  (ZANAFLEX ) 2 MG tablet Take 1 tablet (2 mg total) by mouth 3 (three) times daily as needed for muscle spasms.   Vitamin D , Ergocalciferol , (DRISDOL ) 1.25 MG (50000 UNIT) CAPS capsule Take 1 capsule (50,000 Units total) by mouth every 7 (seven) days. Take with meal.   ALPRAZolam (XANAX) 1 MG tablet Take 1 mg by mouth 2 (two) times daily as needed.  (Patient not taking: Reported on 09/03/2023)   amphetamine-dextroamphetamine (ADDERALL) 20 MG tablet Take 1 tablet (20 mg total) by mouth in the morning, at noon, and at bedtime. Has not started yet 11/10/19 (Patient not taking: Reported on 09/03/2023)   Cholecalciferol (VITAMIN D3) 25 MCG (1000 UT) CAPS Take 1 capsule (1,000 Units total) by mouth daily.   DULoxetine  (CYMBALTA ) 60 MG capsule Take 2 capsules (120 mg total) by mouth daily. (Patient not taking: Reported on 08/28/2023)   metoCLOPramide (REGLAN) 10 MG tablet Take by mouth. (Patient not taking: Reported on 09/03/2023)   QUEtiapine  Fumarate (SEROQUEL  XR) 150 MG 24 hr tablet Take 150 mg by mouth at bedtime.  (Patient not taking: Reported on 09/03/2023)   topiramate  (TOPAMAX ) 200 MG tablet Take 1 tablet (200 mg total) by mouth at bedtime. (Patient not taking: Reported on 08/28/2023)   traZODone  (DESYREL ) 150 MG tablet Take 1 tablet (150 mg total) by mouth at bedtime. (Patient not taking: Reported on 08/28/2023)   VRAYLAR 1.5 MG capsule Take 1.5 mg by  mouth daily. (Patient not taking: Reported on 09/03/2023)   [DISCONTINUED] ARIPiprazole  (ABILIFY ) 5 MG tablet Take 5 mg by mouth daily. (Patient not taking: Reported on 08/28/2023)   [DISCONTINUED] metroNIDAZOLE  (FLAGYL ) 500 MG tablet Take 1 tablet (500 mg total) by mouth 2 (two) times daily. (Patient not taking: Reported on 08/28/2023)   No facility-administered medications prior to visit.    Review of Systems All negative Except see HPI       Objective    BP (!) 95/52 (  BP Location: Left Arm, Patient Position: Sitting, Cuff Size: Large)   Pulse 61   Resp 16   Ht 5' 7 (1.702 m)   Wt 158 lb 14.4 oz (72.1 kg)   LMP 07/04/2023 (Exact Date)   BMI 24.89 kg/m     Physical Exam Vitals reviewed.  Constitutional:      General: She is not in acute distress.    Appearance: Normal appearance. She is well-developed. She is not diaphoretic.  HENT:     Head: Normocephalic and atraumatic.  Eyes:     General: No scleral icterus.    Conjunctiva/sclera: Conjunctivae normal.  Neck:     Thyroid : No thyromegaly.  Cardiovascular:     Rate and Rhythm: Normal rate and regular rhythm.     Pulses: Normal pulses.     Heart sounds: Normal heart sounds. No murmur heard. Pulmonary:     Effort: Pulmonary effort is normal. No respiratory distress.     Breath sounds: Normal breath sounds. No wheezing, rhonchi or rales.  Musculoskeletal:     Cervical back: Neck supple.     Right lower leg: No edema.     Left lower leg: No edema.  Lymphadenopathy:     Cervical: No cervical adenopathy.  Skin:    General: Skin is warm and dry.     Findings: No rash.  Neurological:     Mental Status: She is alert and oriented to person, place, and time. Mental status is at baseline.  Psychiatric:        Mood and Affect: Mood normal.        Behavior: Behavior normal.      No results found for any visits on 09/03/23.      Assessment and Plan Anxiety Panic disorder with agoraphobia Mixed obsessional thoughts  and acts PTSD (post-traumatic stress disorder) Difficulty concentrating - Ambulatory referral to Psychiatry  Chronic pain syndrome Managed by [facility] pain clinic  Inadequate utilities in residence Food insecurity  Type 2 diabetes mellitus with microalbuminuria, without long-term current use of insulin  (HCC) Chronic Advised low carb and exercise - Ambulatory referral to Psychiatry - Hemoglobin A1c - Comprehensive metabolic panel Will follow-up  Mixed hyperlipidemia Chronic Stable Continue taking - Ambulatory referral to Psychiatry - Lipid panel - Comprehensive metabolic panel Advised low cholesterol and daily exercise Will follow-up  Other specified hypotension First reading was in 90s/50s, second in 103/68 Unclear if pt ate or drunk enough water. Continue taking metoprolol  25 and change a dose of lisinopril  to 2.5 Continue taking spironolactone  for acne and BP, might decrease if her bp stay low Advised healthy diet, exercise, measure her bp at home Will follow-up in 2 weeks.  Other fatigue Chronic Workup - Lipid panel - Hemoglobin A1c - TSH - Comprehensive metabolic panel - CBC with Differential/Platelet - B12 and Folate Panel Healthy diet and exercise advised Will follow-up  No orders of the defined types were placed in this encounter.   No follow-ups on file.   The patient was advised to call back or seek an in-person evaluation if the symptoms worsen or if the condition fails to improve as anticipated.  I discussed the assessment and treatment plan with the patient. The patient was provided an opportunity to ask questions and all were answered. The patient agreed with the plan and demonstrated an understanding of the instructions.  I, Lonell Stamos, PA-C have reviewed all documentation for this visit. The documentation on 09/03/2023  for the exam, diagnosis, procedures, and orders are all accurate  and complete.  Jolynn Spencer, [facility], MMS [facility] [phone] (phone) [phone] (fax)  [facility]

## 2023-09-04 DIAGNOSIS — E782 Mixed hyperlipidemia: Secondary | ICD-10-CM | POA: Diagnosis not present

## 2023-09-04 DIAGNOSIS — E1129 Type 2 diabetes mellitus with other diabetic kidney complication: Secondary | ICD-10-CM | POA: Diagnosis not present

## 2023-09-04 DIAGNOSIS — R5383 Other fatigue: Secondary | ICD-10-CM | POA: Diagnosis not present

## 2023-09-04 DIAGNOSIS — R748 Abnormal levels of other serum enzymes: Secondary | ICD-10-CM | POA: Diagnosis not present

## 2023-09-04 DIAGNOSIS — R809 Proteinuria, unspecified: Secondary | ICD-10-CM | POA: Diagnosis not present

## 2023-09-04 NOTE — Telephone Encounter (Signed)
 We discussed the rx during last appointment. Referral to [facility] was placed

## 2023-09-05 ENCOUNTER — Encounter: Payer: Self-pay | Admitting: Family Medicine

## 2023-09-05 ENCOUNTER — Encounter: Payer: Self-pay | Admitting: Physician Assistant

## 2023-09-05 ENCOUNTER — Telehealth: Payer: Medicare PPO | Admitting: Family Medicine

## 2023-09-05 DIAGNOSIS — H10022 Other mucopurulent conjunctivitis, left eye: Secondary | ICD-10-CM | POA: Diagnosis not present

## 2023-09-05 DIAGNOSIS — B309 Viral conjunctivitis, unspecified: Secondary | ICD-10-CM | POA: Insufficient documentation

## 2023-09-05 LAB — CBC WITH DIFFERENTIAL/PLATELET
Basophils Absolute: 0 10*3/uL (ref 0.0–0.2)
Basos: 1 %
EOS (ABSOLUTE): 0.1 10*3/uL (ref 0.0–0.4)
Eos: 2 %
Hematocrit: 41.4 % (ref 34.0–46.6)
Hemoglobin: 13.5 g/dL (ref 11.1–15.9)
Immature Grans (Abs): 0 10*3/uL (ref 0.0–0.1)
Immature Granulocytes: 0 %
Lymphocytes Absolute: 2.1 10*3/uL (ref 0.7–3.1)
Lymphs: 39 %
MCH: 30.1 pg (ref 26.6–33.0)
MCHC: 32.6 g/dL (ref 31.5–35.7)
MCV: 92 fL (ref 79–97)
Monocytes Absolute: 0.4 10*3/uL (ref 0.1–0.9)
Monocytes: 7 %
Neutrophils Absolute: 2.8 10*3/uL (ref 1.4–7.0)
Neutrophils: 51 %
Platelets: 213 10*3/uL (ref 150–450)
RBC: 4.48 x10E6/uL (ref 3.77–5.28)
RDW: 12.9 % (ref 11.7–15.4)
WBC: 5.4 10*3/uL (ref 3.4–10.8)

## 2023-09-05 LAB — B12 AND FOLATE PANEL
Folate: >20 ng/mL (ref >3.0)
Vitamin B-12: 260 pg/mL (ref 232–1245)

## 2023-09-05 LAB — COMPREHENSIVE METABOLIC PANEL WITH GFR
ALT: 25 IU/L (ref 0–32)
AST: 20 IU/L (ref 0–40)
Albumin: 3.9 g/dL (ref 3.8–4.9)
Alkaline Phosphatase: 126 IU/L — ABNORMAL HIGH (ref 44–121)
BUN/Creatinine Ratio: 12 (ref 9–23)
BUN: 12 mg/dL (ref 6–24)
Bilirubin Total: 0.8 mg/dL (ref 0.0–1.2)
CO2: 19 mmol/L — ABNORMAL LOW (ref 20–29)
Calcium: 8.7 mg/dL (ref 8.7–10.2)
Chloride: 109 mmol/L — ABNORMAL HIGH (ref 96–106)
Creatinine, Ser: 0.97 mg/dL (ref 0.57–1.00)
Globulin, Total: 2 g/dL (ref 1.5–4.5)
Glucose: 89 mg/dL (ref 70–99)
Potassium: 3.9 mmol/L (ref 3.5–5.2)
Sodium: 142 mmol/L (ref 134–144)
Total Protein: 5.9 g/dL — ABNORMAL LOW (ref 6.0–8.5)
eGFR: 69 mL/min/1.73

## 2023-09-05 LAB — LIPID PANEL
Chol/HDL Ratio: 2.6 {ratio} (ref 0.0–4.4)
Cholesterol, Total: 103 mg/dL (ref 100–199)
HDL: 39 mg/dL — ABNORMAL LOW (ref >39)
LDL Chol Calc (NIH): 49 mg/dL (ref 0–99)
Triglycerides: 72 mg/dL (ref 0–149)
VLDL Cholesterol Cal: 15 mg/dL (ref 5–40)

## 2023-09-05 LAB — HEMOGLOBIN A1C
Est. average glucose Bld gHb Est-mCnc: 123 mg/dL
Hgb A1c MFr Bld: 5.9 % — ABNORMAL HIGH (ref 4.8–5.6)

## 2023-09-05 LAB — TSH: TSH: 1.57 u[IU]/mL (ref 0.450–4.500)

## 2023-09-05 MED ORDER — CARBOXYMETHYLCELLULOSE SODIUM 0.5 % OP SOLN: 1.0000 [drp] | Freq: Two times a day (BID) | OPHTHALMIC | 0 refills | Status: AC

## 2023-09-05 MED ORDER — OLOPATADINE HCL 0.2 % OP SOLN: 1.0000 [drp] | Freq: Every day | OPHTHALMIC | 0 refills | Status: AC

## 2023-09-05 NOTE — Assessment & Plan Note (Addendum)
 One day of unilateral,  L eye irritation, redness, and green crust when pt woke up Does not wear contacts and no known eye trauma Denies eye pain, vision changes, swelling, headaches, fevers, shortness of breath, sore throat, ear pain, or palpitations. Appears viral in nature given symptoms  -Order antihistamine eye drops for symptomatic relief. -Eye lubrication drops for symptom relief -Advise warm and cool compresses. -If symptoms worsen (increased swelling, vision changes, thick white/yellow discharge), patient to follow up sooner with in patient visit via urgent or ED for further evaluation possible addition of medications and treatment.

## 2023-09-05 NOTE — Assessment & Plan Note (Signed)
 Eye erythema appears to be viral in nature given symptoms and course Unilateral erythema to L eye, no discharge or swelling resent seen in virtual visit Pt had concerns for green crust when she woke up Appears viral in nature at this time Antihistamine and lubricating drops ordered Continue warm or cool compresses to eye If you develop eye pain, vision changes, swelling, headaches, change in amount or type of discharge. Seek in-person care via urgent or ED.

## 2023-09-05 NOTE — Progress Notes (Signed)
 Virtual Visit via Video Note  I connected with Barbara Anderson on 09/05/23 at 11:00 AM EST by a video enabled telemedicine application and verified that I am speaking with the correct person using two identifiers.  Patient Location: Home Provider Location: Office/Clinic  I discussed the limitations, risks, security, and privacy concerns of performing an evaluation and management service by video and the availability of in person appointments. I also discussed with the patient that there may be a patient responsible charge related to this service. The patient expressed understanding and agreed to proceed.  Subjective: PCP: Ostwalt, Janna, PA-C  No chief complaint on file.  Barbara Anderson is a 53 year old female who presents with left eye irritation and discharge.  She has been experiencing irritation in her left eye since yesterday, describing it as 'crusty' upon waking. The discharge is greenish, and the eye feels 'scratchy' and irritated, but not swollen. No eye pain is present.  Due to eye irritation, takes a second for eye to focus, but vision is clear and does not hurt when moved.  No exposure to others with similar symptoms. No one else in household has symptoms and does not wear contacts or glasses. No eye trauma.  She has used Visine eye drops and applied warm compresses yesterday, but the eye remains pinkish.  No fever, headaches, eye pain, or swelling. Presence of greenish discharge and irritation in the left eye.  ROS: Per HPI  Current Outpatient Medications:    carboxymethylcellulose (LUBRICANT EYE DROPS) 0.5 % SOLN, Place 1 drop into the left eye in the morning and at bedtime. For two weeks., Disp: 15 mL, Rfl: 0   Olopatadine  HCl 0.2 % SOLN, Apply 1 drop to eye daily. One drop to L eye twice daily for two weeks., Disp: 2.5 mL, Rfl: 0   Adapalene  (DIFFERIN ) 0.3 % gel, Apply 1 application topically at bedtime., Disp: 45 g, Rfl: 3   Alcohol Swabs (B-D SINGLE USE SWABS REGULAR)  PADS, USE TO CLEANSE SKIN EVERY DAY BEFORE CHECKING BLOOD SUGAR, Disp: 100 each, Rfl: 1   ALPRAZolam (XANAX) 1 MG tablet, Take 1 mg by mouth 2 (two) times daily as needed.  (Patient not taking: Reported on 09/03/2023), Disp: , Rfl:    amphetamine-dextroamphetamine (ADDERALL) 20 MG tablet, Take 1 tablet (20 mg total) by mouth in the morning, at noon, and at bedtime. Has not started yet 11/10/19 (Patient not taking: Reported on 09/03/2023), Disp: , Rfl:    atorvastatin  (LIPITOR) 20 MG tablet, TAKE ONE TABLET (20 MG TOTAL) BY MOUTH DAILY, Disp: 100 tablet, Rfl: 0   Cholecalciferol (VITAMIN D3) 25 MCG (1000 UT) CAPS, Take 1 capsule (1,000 Units total) by mouth daily., Disp: 90 capsule, Rfl: 0   DULoxetine  (CYMBALTA ) 60 MG capsule, Take 2 capsules (120 mg total) by mouth daily. (Patient not taking: Reported on 08/28/2023), Disp: 60 capsule, Rfl: 0   etonogestrel  (NEXPLANON ) 68 MG IMPL implant, 1 each by Subdermal route once., Disp: , Rfl:    ferrous sulfate 325 (65 FE) MG tablet, Take by mouth., Disp: , Rfl:    fluticasone  (FLONASE ) 50 MCG/ACT nasal spray, USE 2 SPRAYS IN BOTH NOSTRILS  DAILY, Disp: 64 g, Rfl: 0   folic acid  (FOLVITE ) 800 MCG tablet, Take 1 tablet (800 mcg total) by mouth daily., Disp: 90 tablet, Rfl: 3   gabapentin  (NEURONTIN ) 300 MG capsule, Take 1 capsule (300 mg total) by mouth at bedtime., Disp: 100 capsule, Rfl: 3   hydroquinone  2 % cream, Apply  topically at bedtime., Disp: 70 g, Rfl: 1   Lancets Misc. (ACCU-CHEK FASTCLIX LANCET) KIT, To check blood sugar once daily, Disp: 1 kit, Rfl: 0   levothyroxine  (SYNTHROID ) 25 MCG tablet, TAKE ONE TABLET (25 MCG TOTAL) BY MOUTH DAILY BEFORE BREAKFAST, Disp: 100 tablet, Rfl: 0   lisinopril  (ZESTRIL ) 5 MG tablet, TAKE ONE TABLET (5 MG TOTAL) BY MOUTH DAILY, Disp: 100 tablet, Rfl: 0   metoCLOPramide (REGLAN) 10 MG tablet, Take by mouth. (Patient not taking: Reported on 09/03/2023), Disp: , Rfl:    metoprolol  succinate (TOPROL -XL) 25 MG 24 hr tablet,  TAKE ONE TABLET (25 MG TOTAL) BY MOUTH DAILY, Disp: 100 tablet, Rfl: 0   Multiple Vitamin tablet, Take 1 tablet by mouth daily., Disp: , Rfl:    omeprazole (PRILOSEC) 40 MG capsule, Take 40 mg by mouth daily., Disp: , Rfl:    OZEMPIC , 1 MG/DOSE, 4 MG/3ML SOPN, Inject 1 mg into the skin once a week., Disp: , Rfl:    pregabalin  (LYRICA ) 200 MG capsule, Take 1 capsule (200 mg total) by mouth 2 (two) times daily., Disp: 200 capsule, Rfl: 3   QUEtiapine  Fumarate (SEROQUEL  XR) 150 MG 24 hr tablet, Take 150 mg by mouth at bedtime.  (Patient not taking: Reported on 09/03/2023), Disp: , Rfl:    ramelteon  (ROZEREM ) 8 MG tablet, TAKE ONE TABLET BY MOUTH EVERY DAY AT BEDTIME, Disp: 90 tablet, Rfl: 0   spironolactone  (ALDACTONE ) 25 MG tablet, TAKE ONE TABLET (25 MG TOTAL) BY MOUTH DAILY, Disp: 100 tablet, Rfl: 0   tiZANidine  (ZANAFLEX ) 2 MG tablet, Take 1 tablet (2 mg total) by mouth 3 (three) times daily as needed for muscle spasms., Disp: 30 tablet, Rfl: 1   topiramate  (TOPAMAX ) 200 MG tablet, Take 1 tablet (200 mg total) by mouth at bedtime. (Patient not taking: Reported on 08/28/2023), Disp: 30 tablet, Rfl: 1   traZODone  (DESYREL ) 150 MG tablet, Take 1 tablet (150 mg total) by mouth at bedtime. (Patient not taking: Reported on 08/28/2023), Disp: 30 tablet, Rfl: 1   Vitamin D , Ergocalciferol , (DRISDOL ) 1.25 MG (50000 UNIT) CAPS capsule, Take 1 capsule (50,000 Units total) by mouth every 7 (seven) days. Take with meal., Disp: 26 capsule, Rfl: 0   VRAYLAR 1.5 MG capsule, Take 1.5 mg by mouth daily. (Patient not taking: Reported on 09/03/2023), Disp: , Rfl:   Observations/Objective: There were no vitals filed for this visit. Physical Exam Vitals reviewed.  Constitutional:      General: She is not in acute distress.    Appearance: Normal appearance. She is normal weight. She is not ill-appearing, toxic-appearing or diaphoretic.  Eyes:     General: Lids are normal. Gaze aligned appropriately. No allergic shiner or  scleral icterus.       Right eye: No foreign body, discharge or hordeolum.        Left eye: No foreign body, discharge or hordeolum.     Extraocular Movements: Extraocular movements intact.     Left eye: No nystagmus.     Conjunctiva/sclera:     Right eye: Right conjunctiva is not injected. No chemosis, exudate or hemorrhage.    Left eye: Left conjunctiva is injected. No chemosis, exudate or hemorrhage.    Pupils: Pupils are equal, round, and reactive to light.     Comments: L eye conjunctiva erythema, watery. No present discharge or swelling appreciated during virtual visit. EOM intact.  R eye intact.  Pulmonary:     Effort: Pulmonary effort is normal. No respiratory distress.  Skin:    General: Skin is dry.     Capillary Refill: Capillary refill takes less than 2 seconds.  Neurological:     General: No focal deficit present.     Mental Status: She is alert and oriented to person, place, and time. Mental status is at baseline.  Psychiatric:        Mood and Affect: Mood normal.        Behavior: Behavior normal.        Thought Content: Thought content normal.        Judgment: Judgment normal.   Assessment and Plan: Pink eye, left Assessment & Plan: One day of unilateral,  L eye irritation, redness, and green crust when pt woke up Does not wear contacts and no known eye trauma Denies eye pain, vision changes, swelling, headaches, fevers, shortness of breath, sore throat, ear pain, or palpitations. Appears viral in nature given symptoms  -Order antihistamine eye drops for symptomatic relief. -Eye lubrication drops for symptom relief -Advise warm and cool compresses. -If symptoms worsen (increased swelling, vision changes, thick white/yellow discharge), patient to follow up sooner with in patient visit via urgent or ED for further evaluation possible addition of medications and treatment.   Viral conjunctivitis, left eye Assessment & Plan: Eye erythema appears to be viral in  nature given symptoms and course Unilateral erythema to L eye, no discharge or swelling resent seen in virtual visit Pt had concerns for green crust when she woke up Appears viral in nature at this time Antihistamine and lubricating drops ordered Continue warm or cool compresses to eye If you develop eye pain, vision changes, swelling, headaches, change in amount or type of discharge. Seek in-person care via urgent or ED.   Orders: -     Olopatadine  HCl; Apply 1 drop to eye daily. One drop to L eye twice daily for two weeks.  Dispense: 2.5 mL; Refill: 0 -     Carboxymethylcellulose Sodium; Place 1 drop into the left eye in the morning and at bedtime. For two weeks.  Dispense: 15 mL; Refill: 0   Follow Up Instructions: Return if symptoms worsen or fail to improve.   I discussed the assessment and treatment plan with the patient. The patient was provided an opportunity to ask questions, and all were answered. The patient agreed with the plan and demonstrated an understanding of the instructions.   The patient was advised to call back or seek an in-person evaluation if the symptoms worsen or if the condition fails to improve as anticipated.  The above assessment and management plan was discussed with the patient. The patient verbalized understanding of and has agreed to the management plan.   I, Curtis DELENA Boom, FNP, have reviewed all documentation for this visit. The documentation on 09/05/23 for the exam, diagnosis, procedures, and orders are all accurate and complete.   Curtis DELENA Boom, FNP

## 2023-09-05 NOTE — Progress Notes (Signed)
 Could you check with [facility] if GGT could be added?

## 2023-09-08 DIAGNOSIS — R413 Other amnesia: Secondary | ICD-10-CM | POA: Diagnosis not present

## 2023-09-08 DIAGNOSIS — F5104 Psychophysiologic insomnia: Secondary | ICD-10-CM | POA: Diagnosis not present

## 2023-09-08 DIAGNOSIS — I10 Essential (primary) hypertension: Secondary | ICD-10-CM | POA: Diagnosis not present

## 2023-09-08 DIAGNOSIS — F3181 Bipolar II disorder: Secondary | ICD-10-CM | POA: Diagnosis not present

## 2023-09-09 ENCOUNTER — Telehealth: Payer: Self-pay | Admitting: Physician Assistant

## 2023-09-09 DIAGNOSIS — R809 Proteinuria, unspecified: Secondary | ICD-10-CM

## 2023-09-09 NOTE — Telephone Encounter (Signed)
 [facility] faxed refill request for the following medications:   OZEMPIC , 1 MG/DOSE, 4 MG/3ML SOPN    Please advise.

## 2023-09-10 ENCOUNTER — Ambulatory Visit: Payer: Self-pay | Admitting: Physician Assistant

## 2023-09-10 MED ORDER — OZEMPIC (1 MG/DOSE) 4 MG/3ML ~~LOC~~ SOPN: 1.0000 mg | PEN_INJECTOR | SUBCUTANEOUS | 1 refills | Status: DC

## 2023-09-11 ENCOUNTER — Ambulatory Visit: Payer: Self-pay

## 2023-09-11 ENCOUNTER — Encounter: Payer: Self-pay | Admitting: Gastroenterology

## 2023-09-11 LAB — GAMMA GT: GGT: 6 [IU]/L (ref 0–60)

## 2023-09-11 LAB — SPECIMEN STATUS REPORT

## 2023-09-11 NOTE — Patient Outreach (Signed)
  Care Coordination   09/11/2023 Name: Barbara Anderson MRN: [id number] DOB: [birth year]   Care Coordination Outreach Attempts:  An unsuccessful outreach was attempted for an appointment today.  Follow Up Plan:  Additional outreach attempts will be made to offer the patient complex care management information and services.   Encounter Outcome:  No Answer   Care Coordination Interventions:  No, not indicated    Lysle Morales, BSW [place]  [facility], [facility] Social Worker Direct Dial: [phone]  Fax: [phone] Website: [email or web address]

## 2023-09-12 ENCOUNTER — Other Ambulatory Visit: Payer: Self-pay | Admitting: Physician Assistant

## 2023-09-12 DIAGNOSIS — E559 Vitamin D deficiency, unspecified: Secondary | ICD-10-CM

## 2023-09-12 NOTE — Telephone Encounter (Signed)
[facility] faxed refill request for the following medications:   Vitamin D, Ergocalciferol, (DRISDOL) 1.25 MG (50000 UNIT) CAPS capsule     Please advise.

## 2023-09-12 NOTE — Telephone Encounter (Signed)
Pt needs to take over the counter vit D3 1000-2000 units daily.

## 2023-09-17 ENCOUNTER — Telehealth: Payer: Self-pay | Admitting: *Deleted

## 2023-09-17 ENCOUNTER — Encounter: Payer: Self-pay | Admitting: Physician Assistant

## 2023-09-17 ENCOUNTER — Telehealth: Payer: Self-pay

## 2023-09-17 ENCOUNTER — Other Ambulatory Visit: Payer: Self-pay

## 2023-09-17 ENCOUNTER — Ambulatory Visit: Payer: Medicare PPO | Admitting: Physician Assistant

## 2023-09-17 VITALS — BP 109/71 | HR 58 | Ht 67.0 in | Wt 156.0 lb

## 2023-09-17 DIAGNOSIS — F4001 Agoraphobia with panic disorder: Secondary | ICD-10-CM | POA: Diagnosis not present

## 2023-09-17 DIAGNOSIS — F319 Bipolar disorder, unspecified: Secondary | ICD-10-CM | POA: Diagnosis not present

## 2023-09-17 DIAGNOSIS — Z860100 Personal history of colon polyps, unspecified: Secondary | ICD-10-CM

## 2023-09-17 DIAGNOSIS — F4310 Post-traumatic stress disorder, unspecified: Secondary | ICD-10-CM

## 2023-09-17 DIAGNOSIS — E782 Mixed hyperlipidemia: Secondary | ICD-10-CM | POA: Diagnosis not present

## 2023-09-17 DIAGNOSIS — E1159 Type 2 diabetes mellitus with other circulatory complications: Secondary | ICD-10-CM | POA: Diagnosis not present

## 2023-09-17 DIAGNOSIS — E034 Atrophy of thyroid (acquired): Secondary | ICD-10-CM | POA: Diagnosis not present

## 2023-09-17 DIAGNOSIS — I152 Hypertension secondary to endocrine disorders: Secondary | ICD-10-CM

## 2023-09-17 DIAGNOSIS — F422 Mixed obsessional thoughts and acts: Secondary | ICD-10-CM

## 2023-09-17 NOTE — Telephone Encounter (Signed)
The patient called in and left a voicemail requesting her time for her colonoscopy. She said they told her she was not on the schedule, and she want [place] to add her to the schedule. I call her to let them know that I receive the patient message, and the patient is added back to the schedule. Marcelino Duster called to get the time and add the patient back.

## 2023-09-17 NOTE — Progress Notes (Unsigned)
Complex Care Management Care Guide Note  09/17/2023 Name: Barbara Anderson MRN: [id number] DOB: 1971/06/09  Barbara Anderson is a 53 y.o. year old female who is a primary care patient of Debera Lat, PA-C and is actively engaged with the care management team. I reached out to Barbara Anderson by phone today to assist with re-scheduling  with the BSW.  Follow up plan: Unsuccessful telephone outreach attempt made. A HIPAA compliant phone message was left for the patient providing contact information and requesting a return call.  Burman Nieves, CMA, Care Guide [facility]  [facility], [facility] Guide Direct Dial: [phone]  Fax: [phone] Website: [email or web address]

## 2023-09-17 NOTE — Progress Notes (Signed)
Established patient visit  Patient: Barbara Anderson   DOB: 12/13/1970   53 y.o. Female  MRN: [id number] Visit Date: 09/17/2023  Today's healthcare provider: Debera Lat, PA-C   Chief Complaint  Patient presents with   Follow-up    Follow up, discuss medication , hasn't follow up with a therapist she was called can't get an appt until the end of march    Subjective      Discussed the use of AI scribe software for clinical note transcription with the patient, who gave verbal consent to proceed.  History of Present Illness   The patient, with a history of multiple psychiatric conditions including anxiety, ADHD, and bipolar disorder, presents after being off her psychiatric medications for a couple of months due to cost and access issues. She has been on multiple medications including Xanax, Adderall, Vraylar, Cymbalta, Seroquel, and Trazodone. She reports that she has not been able to see a psychiatrist yet and is waiting for an appointment. She expresses frustration with the cost of seeing her previous doctor and the delay in getting an appointment with a new provider.  In addition to her psychiatric conditions, the patient also has a history of hypertension. She is currently on Lisinopril and Metoprolol for blood pressure control. However, she has been experiencing low blood pressure recently and has been monitoring her blood pressure at home. She reports feeling dizzy when standing up for too long.           09/17/2023   11:22 AM 08/28/2023    1:25 PM 08/13/2023    1:10 PM  Depression screen PHQ 2/9  Decreased Interest 2 0 0  Down, Depressed, Hopeless 1 0 0  PHQ - 2 Score 3 0 0  Altered sleeping 2 0 0  Tired, decreased energy 2 0 0  Change in appetite 2 0 0  Feeling bad or failure about yourself  0 0 0  Trouble concentrating 2 0 0  Moving slowly or fidgety/restless 0 0 0  Suicidal thoughts 0 0 0  PHQ-9 Score 11 0 0  Difficult doing work/chores Extremely dIfficult Not difficult  at all Not difficult at all      09/17/2023   11:22 AM 06/21/2023    8:20 AM 05/21/2023   11:04 AM 01/11/2021   11:44 AM  GAD 7 : Generalized Anxiety Score  Nervous, Anxious, on Edge 2 0 0 3  Control/stop worrying 2 1 1 3   Worry too much - different things 2 1 1 3   Trouble relaxing 2 0 1 3  Restless 2  0 2  Easily annoyed or irritable 0 0 0 3  Afraid - awful might happen 2 1 1 1   Total GAD 7 Score 12  4 18   Anxiety Difficulty Extremely difficult Not difficult at all Somewhat difficult Extremely difficult    Medications: Outpatient Medications Prior to Visit  Medication Sig   Adapalene (DIFFERIN) 0.3 % gel Apply 1 application topically at bedtime.   Alcohol Swabs (B-D SINGLE USE SWABS REGULAR) PADS USE TO CLEANSE SKIN EVERY DAY BEFORE CHECKING BLOOD SUGAR   ALPRAZolam (XANAX) 1 MG tablet Take 1 mg by mouth 2 (two) times daily as needed.   amphetamine-dextroamphetamine (ADDERALL) 20 MG tablet Take 20 mg by mouth in the morning, at noon, and at bedtime. Has not started yet 11/10/19   atorvastatin (LIPITOR) 20 MG tablet TAKE ONE TABLET (20 MG TOTAL) BY MOUTH DAILY   carboxymethylcellulose (LUBRICANT EYE DROPS) 0.5 % SOLN Place 1 drop into  the left eye in the morning and at bedtime. For two weeks.   Cholecalciferol (VITAMIN D3) 25 MCG (1000 UT) CAPS Take 1 capsule (1,000 Units total) by mouth daily.   DULoxetine (CYMBALTA) 60 MG capsule Take 2 capsules (120 mg total) by mouth daily.   etonogestrel (NEXPLANON) 68 MG IMPL implant 1 each by Subdermal route once.   ferrous sulfate 325 (65 FE) MG tablet Take by mouth.   fluticasone (FLONASE) 50 MCG/ACT nasal spray USE 2 SPRAYS IN BOTH NOSTRILS  DAILY   folic acid (FOLVITE) 800 MCG tablet Take 1 tablet (800 mcg total) by mouth daily.   gabapentin (NEURONTIN) 300 MG capsule Take 1 capsule (300 mg total) by mouth at bedtime.   hydroquinone 2 % cream Apply topically at bedtime.   Lancets Misc. (ACCU-CHEK FASTCLIX LANCET) KIT To check blood sugar  once daily   levothyroxine (SYNTHROID) 25 MCG tablet TAKE ONE TABLET (25 MCG TOTAL) BY MOUTH DAILY BEFORE BREAKFAST   lisinopril (ZESTRIL) 5 MG tablet TAKE ONE TABLET (5 MG TOTAL) BY MOUTH DAILY   metoCLOPramide (REGLAN) 10 MG tablet Take by mouth.   metoprolol succinate (TOPROL-XL) 25 MG 24 hr tablet TAKE ONE TABLET (25 MG TOTAL) BY MOUTH DAILY   Multiple Vitamin tablet Take 1 tablet by mouth daily.   Olopatadine HCl 0.2 % SOLN Apply 1 drop to eye daily. One drop to L eye twice daily for two weeks.   pregabalin (LYRICA) 200 MG capsule Take 1 capsule (200 mg total) by mouth 2 (two) times daily.   QUEtiapine Fumarate (SEROQUEL XR) 150 MG 24 hr tablet Take 150 mg by mouth at bedtime.   ramelteon (ROZEREM) 8 MG tablet TAKE ONE TABLET BY MOUTH EVERY DAY AT BEDTIME   spironolactone (ALDACTONE) 25 MG tablet TAKE ONE TABLET (25 MG TOTAL) BY MOUTH DAILY   tiZANidine (ZANAFLEX) 2 MG tablet Take 1 tablet (2 mg total) by mouth 3 (three) times daily as needed for muscle spasms.   topiramate (TOPAMAX) 200 MG tablet Take 1 tablet (200 mg total) by mouth at bedtime.   traZODone (DESYREL) 150 MG tablet Take 1 tablet (150 mg total) by mouth at bedtime.   Vitamin D, Ergocalciferol, (DRISDOL) 1.25 MG (50000 UNIT) CAPS capsule Take 1 capsule (50,000 Units total) by mouth every 7 (seven) days. Take with meal.   VRAYLAR 1.5 MG capsule Take 1.5 mg by mouth daily.   omeprazole (PRILOSEC) 40 MG capsule Take 40 mg by mouth daily.   OZEMPIC, 1 MG/DOSE, 4 MG/3ML SOPN Inject 1 mg into the skin once a week. (Patient not taking: Reported on 09/11/2023)   No facility-administered medications prior to visit.    Review of Systems All negative Except see HPI       Objective    BP 109/71   Pulse (!) 58   Ht 5\' 7"  (1.702 m)   Wt 156 lb (70.8 kg)   LMP 07/04/2023 (Exact Date)   SpO2 100%   BMI 24.43 kg/m     Physical Exam Vitals reviewed.  Constitutional:      General: She is not in acute distress.     Appearance: Normal appearance. She is well-developed. She is not diaphoretic.  HENT:     Head: Normocephalic and atraumatic.  Eyes:     General: No scleral icterus.    Conjunctiva/sclera: Conjunctivae normal.  Neck:     Thyroid: No thyromegaly.  Cardiovascular:     Rate and Rhythm: Normal rate and regular rhythm.  Pulses: Normal pulses.     Heart sounds: Normal heart sounds. No murmur heard. Pulmonary:     Effort: Pulmonary effort is normal. No respiratory distress.     Breath sounds: Normal breath sounds. No wheezing, rhonchi or rales.  Musculoskeletal:     Cervical back: Neck supple.     Right lower leg: No edema.     Left lower leg: No edema.  Lymphadenopathy:     Cervical: No cervical adenopathy.  Skin:    General: Skin is warm and dry.     Findings: No rash.  Neurological:     Mental Status: She is alert and oriented to person, place, and time. Mental status is at baseline.  Psychiatric:        Mood and Affect: Mood normal.        Behavior: Behavior normal.      No results found for any visits on 09/17/23.      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 Patient reports a diagnosis of bipolar disorder. Referral to [facility] has been made but patient has not yet been seen. -Recommended patient to visit [facility] for urgent behavioral health care to adjust psychiatric medications. -Plan to follow up on bipolar disorder diagnosis.  Hypertension Patient reports low blood pressure readings and occasional dizziness. Currently on Lisinopril 2.5mg  and Metoprolol 25mg . -Reduce Metoprolol to half tablet daily. -Continue monitoring blood pressure and report  if readings consistently below 90/60 or if dizziness persists.  Hyperlipidemia Chronic Ldl 59 The ASCVD Risk score (Arnett DK, et al., 2019) failed to calculate for the following reasons:   The valid total cholesterol range is 130 to 320 mg/dL Patient is currently on Lipitor. -Continue Lipitor as prescribed.  Hypothyroidism Chronic Tsh wnl from 09/04/23 Patient is currently on Levothyroxine. -Continue Levothyroxine as prescribed.   Elevated Alkaline phosphatase, will check ggt at her next visit. Orders are still active Liver enzymes were wnl Follow-up in approximately six weeks.      Foot exam, colonoscopy would be addressed during her next visit  No orders of the defined types were placed in this encounter.   Return in about 6 weeks (around 10/29/2023) for chronic disease f/u.   The patient was advised to call back or seek an in-person evaluation if the symptoms worsen or if the condition fails to improve as anticipated.  I discussed the assessment and treatment plan with the patient. The patient was provided an opportunity to ask questions and all were answered. The patient agreed with the plan and demonstrated an understanding of the instructions.  I, Debera Lat, PA-C have reviewed all documentation for this visit. The documentation on 09/17/2023  for the exam, diagnosis, procedures, and orders are all accurate and complete.  Debera Lat, [facility], MMS [facility] [phone] (phone) [phone] (fax)  [facility]

## 2023-09-18 ENCOUNTER — Ambulatory Visit: Payer: Medicare PPO | Admitting: Anesthesiology

## 2023-09-18 ENCOUNTER — Ambulatory Visit
Admission: RE | Admit: 2023-09-18 | Discharge: 2023-09-18 | Disposition: A | Discharge status: Home / Self Care | Payer: Medicare PPO | Attending: Gastroenterology | Admitting: Gastroenterology

## 2023-09-18 ENCOUNTER — Encounter
Admission: RE | Disposition: A | Discharge status: Home / Self Care | Payer: Self-pay | Source: Home / Self Care | Attending: Gastroenterology

## 2023-09-18 DIAGNOSIS — Z1211 Encounter for screening for malignant neoplasm of colon: Secondary | ICD-10-CM | POA: Diagnosis not present

## 2023-09-18 DIAGNOSIS — K644 Residual hemorrhoidal skin tags: Secondary | ICD-10-CM | POA: Insufficient documentation

## 2023-09-18 DIAGNOSIS — Z5941 Food insecurity: Secondary | ICD-10-CM | POA: Diagnosis not present

## 2023-09-18 DIAGNOSIS — F419 Anxiety disorder, unspecified: Secondary | ICD-10-CM | POA: Diagnosis not present

## 2023-09-18 DIAGNOSIS — Z860100 Personal history of colon polyps, unspecified: Secondary | ICD-10-CM

## 2023-09-18 DIAGNOSIS — Z5986 Financial insecurity: Secondary | ICD-10-CM | POA: Diagnosis not present

## 2023-09-18 DIAGNOSIS — Z860101 Personal history of adenomatous and serrated colon polyps: Secondary | ICD-10-CM | POA: Diagnosis not present

## 2023-09-18 DIAGNOSIS — I10 Essential (primary) hypertension: Secondary | ICD-10-CM | POA: Diagnosis not present

## 2023-09-18 DIAGNOSIS — Z79899 Other long term (current) drug therapy: Secondary | ICD-10-CM | POA: Diagnosis not present

## 2023-09-18 DIAGNOSIS — F319 Bipolar disorder, unspecified: Secondary | ICD-10-CM | POA: Diagnosis not present

## 2023-09-18 HISTORY — DX: Prediabetes: R73.03

## 2023-09-18 HISTORY — PX: COLONOSCOPY WITH PROPOFOL: SHX5780

## 2023-09-18 SURGERY — COLONOSCOPY WITH PROPOFOL
Anesthesia: General

## 2023-09-18 MED ORDER — SODIUM CHLORIDE 0.9% FLUSH
3.0000 mL | Freq: Two times a day (BID) | INTRAVENOUS | Status: DC
Start: 2023-09-18 — End: 2023-09-18

## 2023-09-18 MED ORDER — SODIUM CHLORIDE 0.9% FLUSH
3.0000 mL | INTRAVENOUS | Status: DC | PRN
Start: 2023-09-18 — End: 2023-09-18

## 2023-09-18 MED ORDER — PROPOFOL 10 MG/ML IV BOLUS
INTRAVENOUS | Status: DC | PRN
  Administered 2023-09-18 (×2): 20 mg via INTRAVENOUS
  Administered 2023-09-18: 60 mg via INTRAVENOUS

## 2023-09-18 MED ORDER — LIDOCAINE HCL (CARDIAC) PF 100 MG/5ML IV SOSY
PREFILLED_SYRINGE | INTRAVENOUS | Status: DC | PRN
  Administered 2023-09-18: 100 mg via INTRAVENOUS

## 2023-09-18 MED ORDER — EPHEDRINE SULFATE-NACL 50-0.9 MG/10ML-% IV SOSY
PREFILLED_SYRINGE | INTRAVENOUS | Status: DC | PRN
  Administered 2023-09-18: 10 mg via INTRAVENOUS

## 2023-09-18 MED ORDER — SODIUM CHLORIDE 0.9 % IV SOLN
INTRAVENOUS | Status: DC
  Administered 2023-09-18: 20 mL/h via INTRAVENOUS

## 2023-09-18 MED ORDER — PROPOFOL 500 MG/50ML IV EMUL
INTRAVENOUS | Status: DC | PRN
  Administered 2023-09-18: 165 ug/kg/min via INTRAVENOUS

## 2023-09-18 NOTE — Op Note (Signed)
[facility] Patient Name: Barbara Anderson Procedure Date: 09/18/2023 10:49 AM MRN: [id number] Account #: [id number] Date of Birth: 1971/07/21 Admit Type: Outpatient Age: 53 Room: [facility] ENDO ROOM 4 Gender: Female Note Status: Finalized Instrument Name: Prentice Docker [id number] Procedure:             Colonoscopy Indications:           Surveillance: Personal history of adenomatous polyps                         on last colonoscopy 5 years ago, Last colonoscopy: May                         2021 Providers:             Toney Reil MD, MD Referring MD:          Debera Lat (Referring MD) Medicines:             General Anesthesia Complications:         No immediate complications. Estimated blood loss: None. Procedure:             Pre-Anesthesia Assessment:                        - Prior to the procedure, a History and Physical was                         performed, and patient medications and allergies were                         reviewed. The patient is competent. The risks and                         benefits of the procedure and the sedation options and                         risks were discussed with the patient. All questions                         were answered and informed consent was obtained.                         Patient identification and proposed procedure were                         verified by the physician, the nurse, the                         anesthesiologist, the anesthetist and the technician                         in the pre-procedure area in the procedure room in the                         endoscopy suite. Mental Status Examination: alert and                         oriented. Airway Examination: normal oropharyngeal  airway and neck mobility. Respiratory Examination:                         clear to auscultation. CV Examination: normal.                         Prophylactic Antibiotics: The patient does  not require                         prophylactic antibiotics. Prior Anticoagulants: The                         patient has taken no anticoagulant or antiplatelet                         agents. ASA Grade Assessment: II - A patient with mild                         systemic disease. After reviewing the risks and                         benefits, the patient was deemed in satisfactory                         condition to undergo the procedure. The anesthesia                         plan was to use general anesthesia. Immediately prior                         to administration of medications, the patient was                         re-assessed for adequacy to receive sedatives. The                         heart rate, respiratory rate, oxygen saturations,                         blood pressure, adequacy of pulmonary ventilation, and                         response to care were monitored throughout the                         procedure. The physical status of the patient was                         re-assessed after the procedure.                        After obtaining informed consent, the colonoscope was                         passed under direct vision. Throughout the procedure,                         the patient's blood pressure, pulse, and oxygen  saturations were monitored continuously. The                         Colonoscope was introduced through the anus and                         advanced to the the cecum, identified by appendiceal                         orifice and ileocecal valve. The colonoscopy was                         performed without difficulty. The patient tolerated                         the procedure well. The quality of the bowel                         preparation was evaluated using the BBPS [facility] Bowel                         Preparation Scale) with scores of: Right Colon = 3,                         Transverse Colon = 3 and Left Colon =  3 (entire mucosa                         seen well with no residual staining, small fragments                         of stool or opaque liquid). The total BBPS score                         equals 9. The ileocecal valve, appendiceal orifice,                         and rectum were photographed. Findings:      The perianal and digital rectal examinations were normal. Pertinent       negatives include normal sphincter tone and no palpable rectal lesions.      Non-bleeding external hemorrhoids were found during retroflexion. The       hemorrhoids were medium-sized.      The exam was otherwise without abnormality. Impression:            - Non-bleeding external hemorrhoids.                        - The examination was otherwise normal.                        - No specimens collected. Recommendation:        - Discharge patient to home (with escort).                        - Resume previous diet today.                        - Continue present medications.                        -  Repeat colonoscopy in 5 years for surveillance. Procedure Code(s):     --- Professional ---                        Z6109, Colorectal cancer screening; colonoscopy on                         individual at high risk Diagnosis Code(s):     --- Professional ---                        Z86.010, Personal history of colonic polyps                        K64.4, Residual hemorrhoidal skin tags CPT copyright 2022 American Medical Association. All rights reserved. The codes documented in this report are preliminary and upon coder review may  be revised to meet current compliance requirements. Dr. Libby Maw Toney Reil MD, MD 09/18/2023 11:17:35 AM This report has been signed electronically. Number of Addenda: 0 Note Initiated On: 09/18/2023 10:49 AM Scope Withdrawal Time: 0 hours 10 minutes 34 seconds  Total Procedure Duration: 0 hours 15 minutes 6 seconds  Estimated Blood Loss:  Estimated blood loss: none.       [facility]

## 2023-09-18 NOTE — Anesthesia Preprocedure Evaluation (Signed)
Anesthesia Evaluation  Patient identified by MRN, date of birth, ID band Patient awake    Reviewed: Allergy & Precautions, NPO status , Patient's Chart, lab work & pertinent test results  Airway Mallampati: II  TM Distance: >3 FB Neck ROM: Full    Dental  (+) Teeth Intact   Pulmonary neg pulmonary ROS   Pulmonary exam normal breath sounds clear to auscultation       Cardiovascular Exercise Tolerance: Good hypertension, Pt. on medications negative cardio ROS Normal cardiovascular exam Rhythm:Regular     Neuro/Psych  Headaches  Anxiety  Bipolar Disorder   negative neurological ROS  negative psych ROS   GI/Hepatic negative GI ROS, Neg liver ROS,,,  Endo/Other  negative endocrine ROSdiabetes, Type 2    Renal/GU negative Renal ROS  negative genitourinary   Musculoskeletal   Abdominal Normal abdominal exam  (+)   Peds negative pediatric ROS (+)  Hematology negative hematology ROS (+) Blood dyscrasia, anemia   Anesthesia Other Findings Past Medical History: No date: ADD (attention deficit disorder) No date: Allergy     Comment:  Nasal allergies No date: Anemia     Comment:  Usually iron deficiency No date: Anxiety No date: Back pain 01/06/2019: Cholecystitis, acute 2001: Concussion     Comment:  head trauma-work related. Caused headaches and short               term memory loss. No date: Depression No date: Hyperlipidemia No date: Overactive bladder No date: Pre-diabetes No date: Recurrent vaginitis No date: Thyroid disease     Comment:  Hypothyroid  Past Surgical History: No date: ankle pin and plates placement     Comment:  right No date: AUGMENTATION MAMMAPLASTY 02/27/2022: BARIATRIC SURGERY 01/06/2019: CHOLECYSTECTOMY; N/A     Comment:  Procedure: LAPAROSCOPIC CHOLECYSTECTOMY;  Surgeon:               Ancil Linsey, MD;  Location: [facility] ORS;  Service:               General;  Laterality:  N/A; 12/11/2019: COLONOSCOPY WITH PROPOFOL; N/A     Comment:  Procedure: COLONOSCOPY WITH PROPOFOL;  Surgeon: Toney Reil, MD;  Location: [facility] ENDOSCOPY;  Service:               Gastroenterology;  Laterality: N/A; 2005: COSMETIC SURGERY     Comment:  BREASTS, ARM, ABDOMEN AFTER WEIGHT LOSS No date: EYE SURGERY     Comment:  LASIK surgery No date: FRACTURE SURGERY     Comment:  A screw and a plate in my right ankle 03/07/22: SMALL INTESTINE SURGERY     Comment:  Gastric bypass  BMI    Body Mass Index: 26.56 kg/m      Reproductive/Obstetrics negative OB ROS                             Anesthesia Physical Anesthesia Plan  ASA: 2  Anesthesia Plan: General   Post-op Pain Management:    Induction: Intravenous  PONV Risk Score and Plan: Propofol infusion and TIVA  Airway Management Planned: Natural Airway and Nasal Cannula  Additional Equipment:   Intra-op Plan:   Post-operative Plan:   Informed Consent: I have reviewed the patients History and Physical, chart, labs and discussed the procedure including the risks, benefits and alternatives for the proposed anesthesia with the patient or  authorized representative who has indicated his/her understanding and acceptance.     Dental Advisory Given  Plan Discussed with: CRNA  Anesthesia Plan Comments:        Anesthesia Quick Evaluation

## 2023-09-18 NOTE — H&P (Signed)
Arlyss Repress, MD [address]  [address]  [place], [place] [address]  Main: [phone]  Fax: [phone] Pager: [phone]  Primary Care Physician:  Debera Lat, PA-C Primary Gastroenterologist:  Dr. Arlyss Repress  Pre-Procedure History & Physical: HPI:  Barbara Anderson is a 53 y.o. female is here for an colonoscopy.   Past Medical History:  Diagnosis Date   ADD (attention deficit disorder)    Allergy    Nasal allergies   Anemia    Usually iron deficiency   Anxiety    Back pain    Cholecystitis, acute 01/06/2019   Concussion 2001   head trauma-work related. Caused headaches and short term memory loss.   Depression    Hyperlipidemia    Overactive bladder    Pre-diabetes    Recurrent vaginitis    Thyroid disease    Hypothyroid    Past Surgical History:  Procedure Laterality Date   ankle pin and plates placement     right   AUGMENTATION MAMMAPLASTY     BARIATRIC SURGERY  02/27/2022   CHOLECYSTECTOMY N/A 01/06/2019   Procedure: LAPAROSCOPIC CHOLECYSTECTOMY;  Surgeon: Ancil Linsey, MD;  Location: [facility] ORS;  Service: General;  Laterality: N/A;   COLONOSCOPY WITH PROPOFOL N/A 12/11/2019   Procedure: COLONOSCOPY WITH PROPOFOL;  Surgeon: Toney Reil, MD;  Location: [facility] ENDOSCOPY;  Service: Gastroenterology;  Laterality: N/A;   COSMETIC SURGERY  2005   BREASTS, ARM, ABDOMEN AFTER WEIGHT LOSS   EYE SURGERY     LASIK surgery   FRACTURE SURGERY     A screw and a plate in my right ankle   SMALL INTESTINE SURGERY  03/07/22   Gastric bypass    Prior to Admission medications   Medication Sig Start Date End Date Taking? Authorizing Provider  Adapalene (DIFFERIN) 0.3 % gel Apply 1 application topically at bedtime. 10/05/20   Deirdre Evener, MD  Alcohol Swabs (B-D SINGLE USE SWABS REGULAR) PADS USE TO CLEANSE SKIN EVERY DAY BEFORE CHECKING BLOOD SUGAR 01/23/20   Margaretann Loveless, PA-C  ALPRAZolam Prudy Feeler) 1 MG tablet Take 1 mg by mouth 2 (two) times  daily as needed. 06/24/19   [provider]  amphetamine-dextroamphetamine (ADDERALL) 20 MG tablet Take 20 mg by mouth in the morning, at noon, and at bedtime. Has not started yet 11/10/19 12/23/19   Margaretann Loveless, PA-C  atorvastatin (LIPITOR) 20 MG tablet TAKE ONE TABLET (20 MG TOTAL) BY MOUTH DAILY 07/12/23   Ostwalt, Edmon Crape, PA-C  carboxymethylcellulose (LUBRICANT EYE DROPS) 0.5 % SOLN Place 1 drop into the left eye in the morning and at bedtime. For two weeks. 09/05/23   Sallee Provencal, FNP  Cholecalciferol (VITAMIN D3) 25 MCG (1000 UT) CAPS Take 1 capsule (1,000 Units total) by mouth daily. 10/19/21   Jacky Kindle, FNP  DULoxetine (CYMBALTA) 60 MG capsule Take 2 capsules (120 mg total) by mouth daily. 04/15/17   Pucilowska, Ellin Goodie, MD  etonogestrel (NEXPLANON) 68 MG IMPL implant 1 each by Subdermal route once.    [provider]  ferrous sulfate 325 (65 FE) MG tablet Take by mouth.    [provider]  fluticasone (FLONASE) 50 MCG/ACT nasal spray USE 2 SPRAYS IN BOTH NOSTRILS  DAILY 08/14/23   Ostwalt, Edmon Crape, PA-C  folic acid (FOLVITE) 800 MCG tablet Take 1 tablet (800 mcg total) by mouth daily. 08/08/22   Jacky Kindle, FNP  gabapentin (NEURONTIN) 300 MG capsule Take 1 capsule (300 mg total) by  mouth at bedtime. 04/15/23   Jacky Kindle, FNP  hydroquinone 2 % cream Apply topically at bedtime. 08/09/22   Erasmo Downer, MD  Lancets Misc. (ACCU-CHEK FASTCLIX LANCET) KIT To check blood sugar once daily 07/21/18   Joycelyn Man M, PA-C  levothyroxine (SYNTHROID) 25 MCG tablet TAKE ONE TABLET (25 MCG TOTAL) BY MOUTH DAILY BEFORE BREAKFAST 07/12/23   Ostwalt, Edmon Crape, PA-C  lisinopril (ZESTRIL) 5 MG tablet TAKE ONE TABLET (5 MG TOTAL) BY MOUTH DAILY 07/12/23   Ostwalt, Edmon Crape, PA-C  metoCLOPramide (REGLAN) 10 MG tablet Take by mouth.    [provider]  metoprolol succinate (TOPROL-XL) 25 MG 24 hr tablet TAKE ONE TABLET (25 MG TOTAL) BY MOUTH DAILY  07/12/23   Debera Lat, PA-C  Multiple Vitamin tablet Take 1 tablet by mouth daily.    [provider]  Olopatadine HCl 0.2 % SOLN Apply 1 drop to eye daily. One drop to L eye twice daily for two weeks. 09/05/23   Sallee Provencal, FNP  pregabalin (LYRICA) 200 MG capsule Take 1 capsule (200 mg total) by mouth 2 (two) times daily. 04/15/23   Jacky Kindle, FNP  QUEtiapine Fumarate (SEROQUEL XR) 150 MG 24 hr tablet Take 150 mg by mouth at bedtime. 06/21/19   [provider]  ramelteon (ROZEREM) 8 MG tablet TAKE ONE TABLET BY MOUTH EVERY DAY AT BEDTIME 05/07/23   Merita Norton T, FNP  spironolactone (ALDACTONE) 25 MG tablet TAKE ONE TABLET (25 MG TOTAL) BY MOUTH DAILY 07/12/23   Ostwalt, Edmon Crape, PA-C  tiZANidine (ZANAFLEX) 2 MG tablet Take 1 tablet (2 mg total) by mouth 3 (three) times daily as needed for muscle spasms. 07/26/21   Jacky Kindle, FNP  topiramate (TOPAMAX) 200 MG tablet Take 1 tablet (200 mg total) by mouth at bedtime. 04/15/17   Pucilowska, Braulio Conte B, MD  traZODone (DESYREL) 150 MG tablet Take 1 tablet (150 mg total) by mouth at bedtime. 04/15/17   Pucilowska, Ellin Goodie, MD  Vitamin D, Ergocalciferol, (DRISDOL) 1.25 MG (50000 UNIT) CAPS capsule Take 1 capsule (50,000 Units total) by mouth every 7 (seven) days. Take with meal. 04/15/23   Jacky Kindle, FNP  VRAYLAR 1.5 MG capsule Take 1.5 mg by mouth daily. 12/22/20   [provider]    Allergies as of 08/29/2023 - Review Complete 08/28/2023  Allergen Reaction Noted   Metformin and related Diarrhea 10/05/2019    Family History  Problem Relation Age of Onset   Diabetes Mother    Hypertension Mother    Depression Mother    Vision loss Mother    Varicose Veins Mother    Diabetes Father    Hypertension Father    Anxiety disorder Father    Depression Father    ADD / ADHD Father    COPD Father    Hyperlipidemia Father    Kidney disease Father    Depression Sister    Bipolar disorder Sister    ADD /  ADHD Brother    Anxiety disorder Brother    Depression Brother    Diabetes Brother    Hypertension Brother    ADD / ADHD Sister    Depression Sister    Diabetes Sister    Hypertension Sister    Hypertension Sister    Breast cancer Neg Hx     Social History   Socioeconomic History   Marital status: Divorced    Spouse name: Not on file   Number of children: 0  Years of education: Not on file   Highest education level: Bachelor's degree (e.g., BA, AB, BS)  Occupational History   Occupation: disability  Tobacco Use   Smoking status: Never   Smokeless tobacco: Never  Vaping Use   Vaping status: Never Used  Substance and Sexual Activity   Alcohol use: No   Drug use: No   Sexual activity: Yes    Partners: Male    Birth control/protection: Condom, None  Other Topics Concern   Not on file  Social History Narrative   Not on file   Social Drivers of Health   Financial Resource Strain: High Risk (08/28/2023)   Overall Financial Resource Strain (CARDIA)    Difficulty of Paying Living Expenses: Very hard  Food Insecurity: Food Insecurity Present (08/28/2023)   Hunger Vital Sign    Worried About Running Out of Food in the Last Year: Sometimes true    Ran Out of Food in the Last Year: Sometimes true  Transportation Needs: No Transportation Needs (08/28/2023)   PRAPARE - Administrator, Civil Service (Medical): No    Lack of Transportation (Non-Medical): No  Physical Activity: Inactive (08/28/2023)   Exercise Vital Sign    Days of Exercise per Week: 0 days    Minutes of Exercise per Session: 0 min  Stress: No Stress Concern Present (08/28/2023)   [employer] of Occupational Health - Occupational Stress Questionnaire    Feeling of Stress : Not at all  Social Connections: Socially Isolated (08/28/2023)   Social Connection and Isolation Panel [NHANES]    Frequency of Communication with Friends and Family: More than three times a week    Frequency of Social  Gatherings with Friends and Family: Once a week    Attends Religious Services: Never    Database administrator or Organizations: No    Attends Banker Meetings: Never    Marital Status: Divorced  Catering manager Violence: Not At Risk (08/28/2023)   Humiliation, Afraid, Rape, and Kick questionnaire    Fear of Current or Ex-Partner: No    Emotionally Abused: No    Physically Abused: No    Sexually Abused: No    Review of Systems: See HPI, otherwise negative ROS  Physical Exam: BP 121/80   Pulse 65   Temp (!) 96.9 F (36.1 C) (Temporal)   Resp 16   Ht 5\' 7"  (1.702 m)   Wt 76.9 kg   LMP 07/04/2023 (Exact Date)   SpO2 100%   BMI 26.56 kg/m  General:   Alert,  pleasant and cooperative in NAD Head:  Normocephalic and atraumatic. Neck:  Supple; no masses or thyromegaly. Lungs:  Clear throughout to auscultation.    Heart:  Regular rate and rhythm. Abdomen:  Soft, nontender and nondistended. Normal bowel sounds, without guarding, and without rebound.   Neurologic:  Alert and  oriented x4;  grossly normal neurologically.  Impression/Plan: Barbara Anderson is here for an colonoscopy to be performed for h/o colon adenomas  Risks, benefits, limitations, and alternatives regarding  colonoscopy have been reviewed with the patient.  Questions have been answered.  All parties agreeable.   Lannette Donath, MD  09/18/2023, 10:10 AM

## 2023-09-18 NOTE — Transfer of Care (Signed)
Immediate Anesthesia Transfer of Care Note  Patient: Barbara Anderson  Procedure(s) Performed: COLONOSCOPY WITH PROPOFOL  Patient Location: Endoscopy Unit  Anesthesia Type:General  Level of Consciousness: awake, drowsy, and patient cooperative  Airway & Oxygen Therapy: Patient Spontanous Breathing and Patient connected to face mask oxygen  Post-op Assessment: Report given to RN and Post -op Vital signs reviewed and stable  Post vital signs: Reviewed and stable  Last Vitals:  Vitals Value Taken Time  BP 144/124 09/18/23 1122  Temp 36.7 C 09/18/23 1122  Pulse 76 09/18/23 1122  Resp 16 09/18/23 1122  SpO2 100 % 09/18/23 1122    Last Pain:  Vitals:   09/18/23 1122  TempSrc: Temporal  PainSc:          Complications: No notable events documented.

## 2023-09-18 NOTE — Anesthesia Postprocedure Evaluation (Signed)
Anesthesia Post Note  Patient: Barbara Anderson  Procedure(s) Performed: COLONOSCOPY WITH PROPOFOL  Patient location during evaluation: PACU Anesthesia Type: General Level of consciousness: awake Pain management: pain level controlled Respiratory status: spontaneous breathing and nonlabored ventilation Cardiovascular status: stable Anesthetic complications: no   No notable events documented.   Last Vitals:  Vitals:   09/18/23 1132 09/18/23 1147  BP: 121/81 (!) 112/96  Pulse: 83 (!) 59  Resp: 17 16  Temp:    SpO2: 100% 100%    Last Pain:  Vitals:   09/18/23 1147  TempSrc:   PainSc: 0-No pain                 VAN STAVEREN,Wells Mabe

## 2023-09-19 ENCOUNTER — Encounter: Payer: Self-pay | Admitting: Gastroenterology

## 2023-09-19 ENCOUNTER — Ambulatory Visit: Payer: Medicare PPO | Admitting: Physician Assistant

## 2023-09-19 NOTE — Progress Notes (Signed)
Complex Care Management Care Guide Note  09/19/2023 Name: [place] MRN: [id number] DOB: 1971/05/22  Barbara Anderson is a 53 y.o. year old female who is a primary care patient of Debera Lat, PA-C and is actively engaged with the care management team. I reached out to Louie Boston by phone today to assist with re-scheduling  with the BSW.  Follow up plan: Telephone appointment with complex care management team member scheduled for:  09/26/2023  Burman Nieves, CMA, Care Guide [facility], [facility] Guide Direct Dial: [phone]  Fax: [phone] Website: [email or web address]

## 2023-09-20 DIAGNOSIS — R7309 Other abnormal glucose: Secondary | ICD-10-CM | POA: Diagnosis not present

## 2023-09-22 NOTE — Progress Notes (Unsigned)
 Celso Amy, PA-C [address]  [address]  [place], [place] [address]  Main: [phone]  Fax: [phone]   Gastroenterology Consultation  Referring Provider:     Debera Lat, PA-C Primary Care Physician:  Debera Lat, PA-C Primary Gastroenterologist:  Celso Amy, PA-C / Dr. Lannette Donath   Reason for Consultation:    External hemorrhoids        HPI:   Barbara Anderson is a 53 y.o. y/o female referred for consultation & management  by Debera Lat, PA-C.    Patient is here to evaluate external hemorrhoids.  She has had intermittent external hemorrhoids for many years.  She has episodes of loose stools.  Denies constipation or rectal bleeding.  Her external hemorrhoids were very swollen, burning, and painful 2 months ago.  She used OTC treatments and her hemorrhoids have currently improved.  Currently having bowel movement every other day.  She has not tried any prescription treatment for hemorrhoids.  She is here to discuss treatment options.  09/18/2023 colonoscopy by Dr. Allegra Lai: Medium external hemorrhoids, good prep, otherwise normal with no polyps.  5-year repeat due to a previous history of colon polyps.  11/2019 colonoscopy: Medium external hemorrhoids.  Poor prep.  Two 6 mm adenomatous polyps removed from the transverse and descending colon.  Past Medical History:  Diagnosis Date   ADD (attention deficit disorder)    Allergy    Nasal allergies   Anemia    Usually iron deficiency   Anxiety    Back pain    Cholecystitis, acute 01/06/2019   Concussion 2001   head trauma-work related. Caused headaches and short term memory loss.   Depression    Hyperlipidemia    Overactive bladder    Pre-diabetes    Recurrent vaginitis    Thyroid disease    Hypothyroid    Past Surgical History:  Procedure Laterality Date   ankle pin and plates placement     right   AUGMENTATION MAMMAPLASTY     BARIATRIC SURGERY  02/27/2022   CHOLECYSTECTOMY N/A 01/06/2019    Procedure: LAPAROSCOPIC CHOLECYSTECTOMY;  Surgeon: Ancil Linsey, MD;  Location: [facility] ORS;  Service: General;  Laterality: N/A;   COLONOSCOPY WITH PROPOFOL N/A 12/11/2019   Procedure: COLONOSCOPY WITH PROPOFOL;  Surgeon: Toney Reil, MD;  Location: [facility] ENDOSCOPY;  Service: Gastroenterology;  Laterality: N/A;   COLONOSCOPY WITH PROPOFOL N/A 09/18/2023   Procedure: COLONOSCOPY WITH PROPOFOL;  Surgeon: Toney Reil, MD;  Location: [facility] ENDOSCOPY;  Service: Gastroenterology;  Laterality: N/A;   COSMETIC SURGERY  2005   BREASTS, ARM, ABDOMEN AFTER WEIGHT LOSS   EYE SURGERY     LASIK surgery   FRACTURE SURGERY     A screw and a plate in my right ankle   SMALL INTESTINE SURGERY  03/07/22   Gastric bypass    Prior to Admission medications   Medication Sig Start Date End Date Taking? Authorizing Provider  Adapalene (DIFFERIN) 0.3 % gel Apply 1 application topically at bedtime. 10/05/20   Deirdre Evener, MD  Alcohol Swabs (B-D SINGLE USE SWABS REGULAR) PADS USE TO CLEANSE SKIN EVERY DAY BEFORE CHECKING BLOOD SUGAR 01/23/20   Margaretann Loveless, PA-C  ALPRAZolam Prudy Feeler) 1 MG tablet Take 1 mg by mouth 2 (two) times daily as needed. 06/24/19   [provider]  amphetamine-dextroamphetamine (ADDERALL) 20 MG tablet Take 20 mg by mouth in the morning, at noon, and at bedtime. Has not started yet 11/10/19 12/23/19   Joycelyn Man  M, PA-C  atorvastatin (LIPITOR) 20 MG tablet TAKE ONE TABLET (20 MG TOTAL) BY MOUTH DAILY 07/12/23   Ostwalt, Edmon Crape, PA-C  carboxymethylcellulose (LUBRICANT EYE DROPS) 0.5 % SOLN Place 1 drop into the left eye in the morning and at bedtime. For two weeks. 09/05/23   Sallee Provencal, FNP  Cholecalciferol (VITAMIN D3) 25 MCG (1000 UT) CAPS Take 1 capsule (1,000 Units total) by mouth daily. 10/19/21   Jacky Kindle, FNP  DULoxetine (CYMBALTA) 60 MG capsule Take 2 capsules (120 mg total) by mouth daily. 04/15/17   Pucilowska, Ellin Goodie, MD  etonogestrel  (NEXPLANON) 68 MG IMPL implant 1 each by Subdermal route once.    [provider]  ferrous sulfate 325 (65 FE) MG tablet Take by mouth.    [provider]  fluticasone (FLONASE) 50 MCG/ACT nasal spray USE 2 SPRAYS IN BOTH NOSTRILS  DAILY 08/14/23   Ostwalt, Edmon Crape, PA-C  folic acid (FOLVITE) 800 MCG tablet Take 1 tablet (800 mcg total) by mouth daily. 08/08/22   Jacky Kindle, FNP  gabapentin (NEURONTIN) 300 MG capsule Take 1 capsule (300 mg total) by mouth at bedtime. 04/15/23   Jacky Kindle, FNP  hydroquinone 2 % cream Apply topically at bedtime. 08/09/22   Erasmo Downer, MD  Lancets Misc. (ACCU-CHEK FASTCLIX LANCET) KIT To check blood sugar once daily 07/21/18   Joycelyn Man M, PA-C  levothyroxine (SYNTHROID) 25 MCG tablet TAKE ONE TABLET (25 MCG TOTAL) BY MOUTH DAILY BEFORE BREAKFAST 07/12/23   Ostwalt, Edmon Crape, PA-C  lisinopril (ZESTRIL) 5 MG tablet TAKE ONE TABLET (5 MG TOTAL) BY MOUTH DAILY 07/12/23   Ostwalt, Edmon Crape, PA-C  metoCLOPramide (REGLAN) 10 MG tablet Take by mouth.    [provider]  metoprolol succinate (TOPROL-XL) 25 MG 24 hr tablet TAKE ONE TABLET (25 MG TOTAL) BY MOUTH DAILY 07/12/23   Debera Lat, PA-C  Multiple Vitamin tablet Take 1 tablet by mouth daily.    [provider]  Olopatadine HCl 0.2 % SOLN Apply 1 drop to eye daily. One drop to L eye twice daily for two weeks. 09/05/23   Sallee Provencal, FNP  pregabalin (LYRICA) 200 MG capsule Take 1 capsule (200 mg total) by mouth 2 (two) times daily. 04/15/23   Jacky Kindle, FNP  QUEtiapine Fumarate (SEROQUEL XR) 150 MG 24 hr tablet Take 150 mg by mouth at bedtime. 06/21/19   [provider]  ramelteon (ROZEREM) 8 MG tablet TAKE ONE TABLET BY MOUTH EVERY DAY AT BEDTIME 05/07/23   Merita Norton T, FNP  spironolactone (ALDACTONE) 25 MG tablet TAKE ONE TABLET (25 MG TOTAL) BY MOUTH DAILY 07/12/23   Ostwalt, Edmon Crape, PA-C  tiZANidine (ZANAFLEX) 2 MG tablet Take 1 tablet (2 mg  total) by mouth 3 (three) times daily as needed for muscle spasms. 07/26/21   Jacky Kindle, FNP  topiramate (TOPAMAX) 200 MG tablet Take 1 tablet (200 mg total) by mouth at bedtime. 04/15/17   Pucilowska, Braulio Conte B, MD  traZODone (DESYREL) 150 MG tablet Take 1 tablet (150 mg total) by mouth at bedtime. 04/15/17   Pucilowska, Ellin Goodie, MD  Vitamin D, Ergocalciferol, (DRISDOL) 1.25 MG (50000 UNIT) CAPS capsule Take 1 capsule (50,000 Units total) by mouth every 7 (seven) days. Take with meal. 04/15/23   Jacky Kindle, FNP  VRAYLAR 1.5 MG capsule Take 1.5 mg by mouth daily. 12/22/20   [provider]    Family History  Problem Relation Age of Onset  Diabetes Mother    Hypertension Mother    Depression Mother    Vision loss Mother    Varicose Veins Mother    Diabetes Father    Hypertension Father    Anxiety disorder Father    Depression Father    ADD / ADHD Father    COPD Father    Hyperlipidemia Father    Kidney disease Father    Depression Sister    Bipolar disorder Sister    ADD / ADHD Brother    Anxiety disorder Brother    Depression Brother    Diabetes Brother    Hypertension Brother    ADD / ADHD Sister    Depression Sister    Diabetes Sister    Hypertension Sister    Hypertension Sister    Breast cancer Neg Hx      Social History   Tobacco Use   Smoking status: Never   Smokeless tobacco: Never  Vaping Use   Vaping status: Never Used  Substance Use Topics   Alcohol use: No   Drug use: No    Allergies as of 09/24/2023 - Review Complete 09/24/2023  Allergen Reaction Noted   Metformin and related Diarrhea 10/05/2019    Review of Systems:    All systems reviewed and negative except where noted in HPI.   Physical Exam:  BP 108/73 (BP Location: Left Arm, Patient Position: Sitting, Cuff Size: Large)   Pulse 63   Temp 98.2 F (36.8 C) (Oral)   Ht 5\' 7"  (1.702 m)   Wt 177 lb (80.3 kg)   LMP 07/04/2023 (Exact Date)   BMI 27.72 kg/m  Patient's last  menstrual period was 07/04/2023 (exact date).  General:   Alert,  Well-developed, well-nourished, pleasant and cooperative in NAD Rectal Exam: Pt. Declined.  Pt. Just had Colonoscopy 09/18/23. Neurologic:  Alert and oriented x3;  grossly normal neurologically.  Walks with a cane. Psych:  Alert and cooperative. Normal mood and affect.  Imaging Studies: No results found.  Assessment and Plan:   Kanitra Purifoy is a 53 y.o. y/o female has been referred for:  1.  External hemorrhoids -Discussed treatment for hemorrhoids at length. -Rx hydrocortisone 2.5% cream, apply 2-3 times daily as needed. -Warm water sitz bath with epsom salt for flare up of external hemorrhoids. -Use OTC Preparation H, Tucks Pads, and Witch Hazel wipes as needed. -Avoid sitting on the toilet for long period of time. -Patient education handout given from up-to-date. -Discussed about outpatient hemorrhoid ligation with patient if above therapies fail  2.  Loose Stools  Start OTC FiberCon, Take 2 tablets twice daily to help control diarrhea.  3.  History of colon polyps  5-year repeat colonoscopy will be due 08/2028.  Follow up as needed if symptoms worsen or persist.  Celso Amy, PA-C

## 2023-09-23 ENCOUNTER — Telehealth: Payer: Self-pay

## 2023-09-23 ENCOUNTER — Other Ambulatory Visit: Payer: Self-pay | Admitting: Physician Assistant

## 2023-09-23 DIAGNOSIS — Z79899 Other long term (current) drug therapy: Secondary | ICD-10-CM | POA: Diagnosis not present

## 2023-09-23 DIAGNOSIS — E559 Vitamin D deficiency, unspecified: Secondary | ICD-10-CM

## 2023-09-23 NOTE — Telephone Encounter (Signed)
 Copied from CRM [id number]. Topic: Clinical - Medication Refill >> Sep 23, 2023 10:47 AM Gery Pray wrote: Most Recent Primary Care Visit:  Provider: Tora Kindred  Department: [facility]  Visit Type: MEDICARE AWV, SEQUENTIAL  Date: 08/28/2023  Medication: Vitamin D, Ergocalciferol, (DRISDOL) 1.25 MG (50000 UNIT) CAPS capsule  Has the patient contacted their pharmacy? Yes (Agent: If no, request that the patient contact the pharmacy for the refill. If patient does not wish to contact the pharmacy document the reason why and proceed with request.) (Agent: If yes, when and what did the pharmacy advise?)  Is this the correct pharmacy for this prescription? Yes If no, delete pharmacy and type the correct one.  This is the patient's preferred pharmacy:   [facility] [place] [address] [place] [address] Phone: [phone] Fax: [phone]   Has the prescription been filled recently? No  Is the patient out of the medication? Yes  Has the patient been seen for an appointment in the last year OR does the patient have an upcoming appointment? Yes  Can we respond through MyChart? Yes  Agent: Please be advised that Rx refills may take up to 3 business days. We ask that you follow-up with your pharmacy.

## 2023-09-23 NOTE — Telephone Encounter (Signed)
 notified

## 2023-09-23 NOTE — Telephone Encounter (Signed)
 Does she need refill or use OTC?

## 2023-09-23 NOTE — Telephone Encounter (Unsigned)
 Copied from CRM [id number]. Topic: Clinical - Medication Refill >> Sep 23, 2023 10:47 AM Gery Pray wrote: Most Recent Primary Care Visit:  Provider: Tora Kindred  Department: [facility]  Visit Type: MEDICARE AWV, SEQUENTIAL  Date: 08/28/2023  Medication: Vitamin D, Ergocalciferol, (DRISDOL) 1.25 MG (50000 UNIT) CAPS capsule  Has the patient contacted their pharmacy? Yes (Agent: If no, request that the patient contact the pharmacy for the refill. If patient does not wish to contact the pharmacy document the reason why and proceed with request.) (Agent: If yes, when and what did the pharmacy advise?)  Is this the correct pharmacy for this prescription? Yes If no, delete pharmacy and type the correct one.  This is the patient's preferred pharmacy:   [facility] [place] - [address] [place] [address] Phone: [phone] Fax: [phone]   Has the prescription been filled recently? No  Is the patient out of the medication? Yes  Has the patient been seen for an appointment in the last year OR does the patient have an upcoming appointment? Yes  Can we respond through MyChart? Yes  Agent: Please be advised that Rx refills may take up to 3 business days. We ask that you follow-up with your pharmacy.

## 2023-09-24 ENCOUNTER — Ambulatory Visit (INDEPENDENT_AMBULATORY_CARE_PROVIDER_SITE_OTHER): Payer: Medicare PPO | Admitting: Physician Assistant

## 2023-09-24 ENCOUNTER — Encounter: Payer: Self-pay | Admitting: Physician Assistant

## 2023-09-24 VITALS — BP 108/73 | HR 63 | Temp 98.2°F | Ht 67.0 in | Wt 177.0 lb

## 2023-09-24 DIAGNOSIS — R195 Other fecal abnormalities: Secondary | ICD-10-CM

## 2023-09-24 DIAGNOSIS — F1320 Sedative, hypnotic or anxiolytic dependence, uncomplicated: Secondary | ICD-10-CM | POA: Diagnosis not present

## 2023-09-24 DIAGNOSIS — K644 Residual hemorrhoidal skin tags: Secondary | ICD-10-CM

## 2023-09-24 DIAGNOSIS — E039 Hypothyroidism, unspecified: Secondary | ICD-10-CM | POA: Diagnosis not present

## 2023-09-24 DIAGNOSIS — M4726 Other spondylosis with radiculopathy, lumbar region: Secondary | ICD-10-CM | POA: Diagnosis not present

## 2023-09-24 DIAGNOSIS — R7309 Other abnormal glucose: Secondary | ICD-10-CM | POA: Diagnosis not present

## 2023-09-24 DIAGNOSIS — Z860100 Personal history of colon polyps, unspecified: Secondary | ICD-10-CM

## 2023-09-24 DIAGNOSIS — Z5181 Encounter for therapeutic drug level monitoring: Secondary | ICD-10-CM | POA: Diagnosis not present

## 2023-09-24 DIAGNOSIS — R413 Other amnesia: Secondary | ICD-10-CM | POA: Diagnosis not present

## 2023-09-24 DIAGNOSIS — I10 Essential (primary) hypertension: Secondary | ICD-10-CM | POA: Diagnosis not present

## 2023-09-24 MED ORDER — HYDROCORTISONE (PERIANAL) 2.5 % EX CREA: 1.0000 | TOPICAL_CREAM | Freq: Two times a day (BID) | CUTANEOUS | 1 refills | Status: AC

## 2023-09-24 NOTE — Patient Instructions (Signed)
   Take 2 Caplets with a full glass of water or other liquid (8 ounces/240 milliliters)  Once Daily to help with Diarrhea.  Increase to 2 Caplets Twice daily if needed.     Treatment For External Hemorrhoids: Rx Hydrocortisone 2.5% Cream, Apply 2-3 times daily as needed for flare-up. Warm water sitz bath with epsom salt for flare up of external hemorrhoids. Use OTC Preparation H, Tucks Pads, and Witch Hazel wipes as needed. Avoid Sitting on the toilet for prolonged amount of time. Discussed referral for Surgery as a last resort if Conservative treatment fails.

## 2023-09-26 ENCOUNTER — Ambulatory Visit: Payer: Self-pay

## 2023-09-26 DIAGNOSIS — F902 Attention-deficit hyperactivity disorder, combined type: Secondary | ICD-10-CM | POA: Diagnosis not present

## 2023-09-26 DIAGNOSIS — F3132 Bipolar disorder, current episode depressed, moderate: Secondary | ICD-10-CM | POA: Diagnosis not present

## 2023-09-26 DIAGNOSIS — F4312 Post-traumatic stress disorder, chronic: Secondary | ICD-10-CM | POA: Diagnosis not present

## 2023-09-26 DIAGNOSIS — F411 Generalized anxiety disorder: Secondary | ICD-10-CM | POA: Diagnosis not present

## 2023-09-26 NOTE — Patient Outreach (Signed)
 Care Coordination   09/26/2023 Name: Barbara Anderson MRN: [id number] DOB: Jul 22, 1971   Care Coordination Outreach Attempts:  An unsuccessful outreach was attempted for an appointment today.  Follow Up Plan:  Additional outreach attempts will be made to offer the patient complex care management information and services.   Encounter Outcome:  No Answer   Care Coordination Interventions:  No, not indicated    Lysle Morales, BSW [place]  [facility] Social Worker Direct Dial: [phone]  Fax: [phone] Website: [email or web address]

## 2023-10-01 ENCOUNTER — Telehealth: Payer: Self-pay

## 2023-10-01 NOTE — Telephone Encounter (Signed)
 Patient states she has not had a cycle since her nexplanon was placed 06/2023. Inquiring if this is normal. Advised yes, this can be normal. Especially due to her age. She denies pregnancy symptoms, she has not done a home pregnancy test. Advised can do one to confirm if she wishes. Otherwise, normal. She also advised her insurance is denying the claim for nexplanon insertion. She is inquiring if Dr. Lonny Prude can submit an appeal. Advised to contact billing regarding this issue.

## 2023-10-07 ENCOUNTER — Ambulatory Visit: Payer: Self-pay

## 2023-10-07 NOTE — Patient Instructions (Signed)
 Visit Information  Thank you for taking time to visit with me today. Please don't hesitate to contact me if I can be of assistance to you.   Following are the goals we discussed today:  Patient will continue to make payment on equal payment plan for gas and power. Patient will contact community food banks for food.     If you are experiencing a Mental Health or Behavioral Health Crisis or need someone to talk to, please call 911  Patient verbalizes understanding of instructions and care plan provided today and agrees to view in MyChart. Active MyChart status and patient understanding of how to access instructions and care plan via MyChart confirmed with patient.     No further follow up required: Patient does not request a follow up appointment.  Lysle Morales, BSW [place]  [facility], [facility] Social Worker Direct Dial: [phone]  Fax: [phone] Website: [email or web address]

## 2023-10-07 NOTE — Patient Outreach (Signed)
 Care Coordination   Initial Visit Note   10/07/2023 Name: Ailie Gage MRN: [id number] DOB: September 06, 1970  Nykiah Ma is a 53 y.o. year old female who sees [place], [place], [place] for primary care. I spoke with  Louie Boston by phone today.  What matters to the patients health and wellness today?  Patient needs assistance with resources due to being over budget.  Patients home is paid, but other bills make it difficult to have enough for food.    Goals Addressed             This Visit's Progress    Care Coordination Activities       Interventions Today    Flowsheet Row Most Recent Value  Chronic Disease   Chronic disease during today's visit Diabetes, Chronic Kidney Disease/End Stage Renal Disease (ESRD), Hypertension (HTN)  General Interventions   General Interventions Discussed/Reviewed General Interventions Discussed, General Interventions Reviewed, [employer] bills exceed income.Pt owns home.Gas/Power are on equal payment plan & not at risk of disconnection.$40 [employer] starts in April.Car payment ends about 5 months.SW provides food bank list & information on [employer]IE[employer]/DSS to apply in December.]  Education Interventions   Education Provided Provided Education  [SW provided budget counseling. Pt will be able to better manage bills once car is paid off in 5 months.]              SDOH assessments and interventions completed:  Yes  SDOH Interventions Today    Flowsheet Row Most Recent Value  SDOH Interventions   Food Insecurity Interventions Other (Comment)  [Food bank list and receive Foodstamps]  Housing Interventions Intervention Not Indicated  Transportation Interventions Intervention Not Indicated  [has a car]  Utilities Interventions Intervention Not Indicated  [On the equal payment plan with gas and power]        Care Coordination Interventions:  Yes, provided   Follow up plan: No further intervention required.   Encounter Outcome:  Patient Visit  Completed

## 2023-10-09 ENCOUNTER — Other Ambulatory Visit: Payer: Self-pay | Admitting: Physician Assistant

## 2023-10-09 DIAGNOSIS — E1159 Type 2 diabetes mellitus with other circulatory complications: Secondary | ICD-10-CM

## 2023-10-09 DIAGNOSIS — E034 Atrophy of thyroid (acquired): Secondary | ICD-10-CM

## 2023-10-09 DIAGNOSIS — L700 Acne vulgaris: Secondary | ICD-10-CM

## 2023-10-09 DIAGNOSIS — E782 Mixed hyperlipidemia: Secondary | ICD-10-CM

## 2023-10-09 NOTE — Telephone Encounter (Signed)
 Requested Prescriptions  Pending Prescriptions Disp Refills   spironolactone (ALDACTONE) 25 MG tablet [Pharmacy Med Name: spironolactone 25 mg tablet] 100 tablet 0    Sig: TAKE ONE TABLET (25 MG TOTAL) BY MOUTH DAILY     Cardiovascular: Diuretics - Aldosterone Antagonist Passed - 10/09/2023  3:41 PM      Passed - Cr in normal range and within 180 days    Creatinine  Date Value Ref Range Status  05/12/2014 0.88 0.60 - 1.30 mg/dL Final   Creat  Date Value Ref Range Status  04/22/2014 0.79 0.50 - 1.10 mg/dL Final   Creatinine, Ser  Date Value Ref Range Status  09/04/2023 0.97 0.57 - 1.00 mg/dL Final         Passed - K in normal range and within 180 days    Potassium  Date Value Ref Range Status  09/04/2023 3.9 3.5 - 5.2 mmol/L Final  05/12/2014 3.0 (L) 3.5 - 5.1 mmol/L Final         Passed - Na in normal range and within 180 days    Sodium  Date Value Ref Range Status  09/04/2023 142 134 - 144 mmol/L Final  05/12/2014 144 136 - 145 mmol/L Final         Passed - eGFR is 30 or above and within 180 days    EGFR (African American)  Date Value Ref Range Status  10/21/2013 >60  Final   GFR calc Af Amer  Date Value Ref Range Status  02/04/2020 76 >59 mL/min/1.73 Final    Comment:    **[facility] currently reports eGFR in compliance with the current**   recommendations of the [employer]. [facility] will   update reporting as new guidelines are published from the NKF-ASN   Task force.    EGFR (Non-African Amer.)  Date Value Ref Range Status  10/21/2013 >60  Final    Comment:    eGFR values <77mL/min/1.73 m2 may be an indication of chronic kidney disease (CKD). Calculated eGFR is useful in patients with stable renal function. The eGFR calculation will not be reliable in acutely ill patients when serum creatinine is changing rapidly. It is not useful in  patients on dialysis. The eGFR calculation may not be applicable to patients at the low and high extremes of  body sizes, pregnant women, and vegetarians.    GFR calc non Af Amer  Date Value Ref Range Status  02/04/2020 66 >59 mL/min/1.73 Final   eGFR  Date Value Ref Range Status  09/04/2023 69 >59 mL/min/1.73 Final         Passed - Last BP in normal range    BP Readings from Last 1 Encounters:  09/24/23 108/73         Passed - Valid encounter within last 6 months    Recent Outpatient Visits           1 month ago Other hemorrhoids   [place] [facility] [place], [place], PA-C   3 months ago UTI symptoms   [facility]   4 months ago UTI symptoms   [facility] Merita Norton T, FNP   11 months ago Traumatic brain injury with loss of consciousness, sequela [facility])   [place] [facility] T, FNP   11 months ago Traumatic brain injury with loss of consciousness, sequela [facility])   [facility] Jacky Kindle, [place]  metoprolol succinate (TOPROL-XL) 25 MG 24 hr tablet [Pharmacy Med Name: metoprolol succinate ER 25 mg tablet,extended release 24 hr] 100 tablet 0    Sig: TAKE ONE TABLET (25 MG TOTAL) BY MOUTH DAILY     Cardiovascular:  Beta Blockers Passed - 10/09/2023  3:41 PM      Passed - Last BP in normal range    BP Readings from Last 1 Encounters:  09/24/23 108/73         Passed - Last Heart Rate in normal range    Pulse Readings from Last 1 Encounters:  09/24/23 63         Passed - Valid encounter within last 6 months    Recent Outpatient Visits           1 month ago Other hemorrhoids   [place] [facility] [place], [place], PA-C   3 months ago UTI symptoms   [facility] Malva Limes, MD   4 months ago UTI symptoms   [facility] Merita Norton T, FNP   11 months ago Traumatic brain injury with loss of consciousness, sequela [facility])   [place]  [facility] Merita Norton T, FNP   11 months ago Traumatic brain injury with loss of consciousness, sequela [facility])   [place] [facility] Merita Norton T, FNP               lisinopril (ZESTRIL) 5 MG tablet [Pharmacy Med Name: lisinopril 5 mg tablet] 100 tablet 0    Sig: TAKE ONE TABLET (5 MG TOTAL) BY MOUTH DAILY     Cardiovascular:  ACE Inhibitors Passed - 10/09/2023  3:41 PM      Passed - Cr in normal range and within 180 days    Creatinine  Date Value Ref Range Status  05/12/2014 0.88 0.60 - 1.30 mg/dL Final   Creat  Date Value Ref Range Status  04/22/2014 0.79 0.50 - 1.10 mg/dL Final   Creatinine, Ser  Date Value Ref Range Status  09/04/2023 0.97 0.57 - 1.00 mg/dL Final         Passed - K in normal range and within 180 days    Potassium  Date Value Ref Range Status  09/04/2023 3.9 3.5 - 5.2 mmol/L Final  05/12/2014 3.0 (L) 3.5 - 5.1 mmol/L Final         Passed - Patient is not pregnant      Passed - Last BP in normal range    BP Readings from Last 1 Encounters:  09/24/23 108/73         Passed - Valid encounter within last 6 months    Recent Outpatient Visits           1 month ago Other hemorrhoids   [place] [facility] [place], [place], PA-C   3 months ago UTI symptoms   [facility]   4 months ago UTI symptoms   [facility] T, FNP   11 months ago Traumatic brain injury with loss of consciousness, sequela [facility])   [facility] Merita Norton T, FNP   11 months ago Traumatic brain injury with loss of consciousness, sequela [facility])    [facility] Merita Norton T, FNP               atorvastatin (LIPITOR) 20 MG tablet [Pharmacy Med Name: atorvastatin 20  mg tablet] 100 tablet 0    Sig: TAKE ONE TABLET (20 MG TOTAL) BY MOUTH DAILY     Cardiovascular:  Antilipid - Statins  Failed - 10/09/2023  3:41 PM      Failed - Lipid Panel in normal range within the last 12 months    Cholesterol, Total  Date Value Ref Range Status  09/04/2023 103 100 - 199 mg/dL Final   LDL Chol Calc (NIH)  Date Value Ref Range Status  09/04/2023 49 0 - 99 mg/dL Final   HDL  Date Value Ref Range Status  09/04/2023 39 (L) >39 mg/dL Final   Triglycerides  Date Value Ref Range Status  09/04/2023 72 0 - 149 mg/dL Final         Passed - Patient is not pregnant      Passed - Valid encounter within last 12 months    Recent Outpatient Visits           1 month ago Other hemorrhoids   [place] [facility] [place], [place], PA-C   3 months ago UTI symptoms   [facility] Mal[facility]imes, MD   4 months ago UTI symptoms   [facility] Merita Norton T, FNP   11 months ago Traumatic brain injury with loss of consciousness, sequela [facility])   [place] [facility] Merita Norton T, FNP   11 months ago Traumatic brain injury with loss of consciousness, sequela [facility])   [place] [facility] Merita Norton T, FNP               levothyroxine (SYNTHROID) 25 MCG tablet [Pharmacy Med Name: levothyroxine 25 mcg tablet] 100 tablet 0    Sig: TAKE ONE TABLET (25 MCG TOTAL) BY MOUTH DAILY BEFORE BREAKFAST     Endocrinology:  Hypothyroid Agents Passed - 10/09/2023  3:41 PM      Passed - TSH in normal range and within 360 days    TSH  Date Value Ref Range Status  09/04/2023 1.570 0.450 - 4.500 uIU/mL Final         Passed - Valid encounter within last 12 months    Recent Outpatient Visits           1 month ago Other hemorrhoids   [place] [facility] [place], [place], PA-C   3 months ago UTI symptoms   [facility] Malva Limes, MD   4 months ago UTI symptoms   [facility] Merita Norton T, FNP   11 months ago Traumatic  brain injury with loss of consciousness, sequela [facility])   [place] [facility] Merita Norton T, FNP   11 months ago Traumatic brain injury with loss of consciousness, sequela [facility])   [facility] Jacky Kindle, [place]

## 2023-10-10 ENCOUNTER — Other Ambulatory Visit: Payer: Self-pay | Admitting: Physician Assistant

## 2023-10-10 DIAGNOSIS — E1159 Type 2 diabetes mellitus with other circulatory complications: Secondary | ICD-10-CM

## 2023-10-10 DIAGNOSIS — E034 Atrophy of thyroid (acquired): Secondary | ICD-10-CM

## 2023-10-10 DIAGNOSIS — L700 Acne vulgaris: Secondary | ICD-10-CM

## 2023-10-10 DIAGNOSIS — E782 Mixed hyperlipidemia: Secondary | ICD-10-CM

## 2023-10-10 NOTE — Telephone Encounter (Signed)
 Copied from CRM [id number]. Topic: Clinical - Medication Refill >> Oct 10, 2023  3:59 PM Truddie Crumble wrote: Most Recent Primary Care Visit:  Provider: Debera Lat  [facility]  Visit Type: OFFICE VISIT  Date: 09/17/2023  Medication: levothyroxine (SYNTHROID) 25 MCG tablet, spironolactone (ALDACTONE) 25 MG tablet, atorvastatin (LIPITOR) 20 MG tablet, lisinopril (ZESTRIL) 5 MG tablet,  metoprolol succinate (TOPROL-XL) 25 MG 24 hr tablet  Has the patient contacted their pharmacy? No (Agent: If no, request that the patient contact the pharmacy for the refill. If patient does not wish to contact the pharmacy document the reason why and proceed with request.) (Agent: If yes, when and what did the pharmacy advise?)  Is this the correct pharmacy for this prescription? Yes If no, delete pharmacy and type the correct one.  This is the patient's preferred pharmacy:   [facility] [place] [address] [place] [address] Phone: [phone] Fax: [phone]   Has the prescription been filled recently? No  Is the patient out of the medication? Yes  Has the patient been seen for an appointment in the last year OR does the patient have an upcoming appointment? Yes  Can we respond through MyChart? Yes  Agent: Please be advised that Rx refills may take up to 3 business days. We ask that you follow-up with your pharmacy.

## 2023-10-11 ENCOUNTER — Telehealth: Payer: Self-pay

## 2023-10-11 ENCOUNTER — Telehealth: Payer: Self-pay | Admitting: Physician Assistant

## 2023-10-11 DIAGNOSIS — R0982 Postnasal drip: Secondary | ICD-10-CM

## 2023-10-11 DIAGNOSIS — J3489 Other specified disorders of nose and nasal sinuses: Secondary | ICD-10-CM

## 2023-10-11 NOTE — Telephone Encounter (Signed)
 Requested Prescriptions  Refused Prescriptions Disp Refills   atorvastatin (LIPITOR) 20 MG tablet 100 tablet 0    Sig: Take 1 tablet (20 mg total) by mouth daily.     Cardiovascular:  Antilipid - Statins Failed - 10/11/2023 12:16 PM      Failed - Lipid Panel in normal range within the last 12 months    Cholesterol, Total  Date Value Ref Range Status  09/04/2023 103 100 - 199 mg/dL Final   LDL Chol Calc (NIH)  Date Value Ref Range Status  09/04/2023 49 0 - 99 mg/dL Final   HDL  Date Value Ref Range Status  09/04/2023 39 (L) >39 mg/dL Final   Triglycerides  Date Value Ref Range Status  09/04/2023 72 0 - 149 mg/dL Final         Passed - Patient is not pregnant      Passed - Valid encounter within last 12 months    Recent Outpatient Visits           1 month ago Other hemorrhoids   [place] [facility] [place], [place], PA-C   3 months ago UTI symptoms   [facility] Malva Limes, MD   4 months ago UTI symptoms   [facility] Mer[facility] Norton T, FNP   11 months ago Traumatic brain injury with loss of consciousness, sequela [facility])   [place] [facility] Merita Norton T, FNP   11 months ago Traumatic brain injury with loss of consciousness, sequela [facility])   [facility] Merita Norton T, FNP               levothyroxine (SYNTHROID) 25 MCG tablet 100 tablet 0    Sig: Take 1 tablet (25 mcg total) by mouth.     Endocrinology:  Hypothyroid Agents Passed - 10/11/2023 12:16 PM      Passed - TSH in normal range and within 360 days    TSH  Date Value Ref Range Status  09/04/2023 1.570 0.450 - 4.500 uIU/mL Final         Passed - Valid encounter within last 12 months    Recent Outpatient Visits           1 month ago Other hemorrhoids   [place] [facility] [employer], [place], PA-C   3 months ago UTI symptoms   [facility]  Malva Limes, MD   4 months ago UTI symptoms   [facility] Merita Norton T, FNP   11 months ago Traumatic brain injury with loss of consciousness, sequela [facility])   [facility] Merita Norton T, FNP   11 months ago Traumatic brain injury with loss of consciousness, sequela [facility])   [facility] Merita Norton T, FNP               spironolactone (ALDACTONE) 25 MG tablet 100 tablet 0    Sig: Take 1 tablet (25 mg total) by mouth daily.     Cardiovascular: Diuretics - Aldosterone Antagonist Passed - 10/11/2023 12:16 PM      Passed - Cr in normal range and within 180 days    Creatinine  Date Value Ref Range Status  05/12/2014 0.88 0.60 - 1.30 mg/dL Final   Creat  Date Value Ref Range Status  04/22/2014 0.79 0.50 - 1.10 mg/dL Final   Creatinine, Ser  Date Value Ref Range Status  09/04/2023 0.97 0.57 - 1.00 mg/dL Final         Passed - K in normal range and within 180 days    Potassium  Date Value Ref Range Status  09/04/2023 3.9 3.5 - 5.2 mmol/L Final  05/12/2014 3.0 (L) 3.5 - 5.1 mmol/L Final         Passed - Na in normal range and within 180 days    Sodium  Date Value Ref Range Status  09/04/2023 142 134 - 144 mmol/L Final  05/12/2014 144 136 - 145 mmol/L Final         Passed - eGFR is 30 or above and within 180 days    EGFR (African American)  Date Value Ref Range Status  10/21/2013 >60  Final   GFR calc Af Amer  Date Value Ref Range Status  02/04/2020 76 >59 mL/min/1.73 Final    Comment:    **[facility] currently reports eGFR in compliance with the current**   recommendations of the [employer]. [facility] will   update reporting as new guidelines are published from the NKF-ASN   Task force.    EGFR (Non-African Amer.)  Date Value Ref Range Status  10/21/2013 >60  Final    Comment:    eGFR values <78mL/min/1.73 m2 may be an indication of chronic kidney disease  (CKD). Calculated eGFR is useful in patients with stable renal function. The eGFR calculation will not be reliable in acutely ill patients when serum creatinine is changing rapidly. It is not useful in  patients on dialysis. The eGFR calculation may not be applicable to patients at the low and high extremes of body sizes, pregnant women, and vegetarians.    GFR calc non Af Amer  Date Value Ref Range Status  02/04/2020 66 >59 mL/min/1.73 Final   eGFR  Date Value Ref Range Status  09/04/2023 69 >59 mL/min/1.73 Final         Passed - Last BP in normal range    BP Readings from Last 1 Encounters:  09/24/23 108/73         Passed - Valid encounter within last 6 months    Recent Outpatient Visits           1 month ago Other hemorrhoids   [place] [facility] [place], [place], PA-C   3 months ago UTI symptoms   [place] [facility] Malva Limes, MD   4 months ago UTI symptoms   [facility] T, FNP   11 months ago Traumatic brain injury with loss of consciousness, sequela [facility])   [facility] Merita Norton T, FNP   11 months ago Traumatic brain injury with loss of consciousness, sequela [facility])   [place] [facility] Merita Norton T, FNP               lisinopril (ZESTRIL) 5 MG tablet 100 tablet 0    Sig: Take 1 tablet (5 mg total) by mouth daily.     Cardiovascular:  ACE Inhibitors Passed - 10/11/2023 12:16 PM      Passed - Cr in normal range and within 180 days    Creatinine  Date Value Ref Range Status  05/12/2014 0.88 0.60 - 1.30 mg/dL Final   Creat  Date Value Ref Range Status  04/22/2014 0.79 0.50 - 1.10 mg/dL Final   Creatinine, Ser  Date Value Ref Range Status  09/04/2023 0.97 0.57 - 1.00 mg/dL Final  Passed - K in normal range and within 180 days    Potassium  Date Value Ref Range Status  09/04/2023 3.9 3.5 - 5.2 mmol/L Final   05/12/2014 3.0 (L) 3.5 - 5.1 mmol/L Final         Passed - Patient is not pregnant      Passed - Last BP in normal range    BP Readings from Last 1 Encounters:  09/24/23 108/73         Passed - Valid encounter within last 6 months    Recent Outpatient Visits           1 month ago Other hemorrhoids   [place] [facility] [place], [place], PA-C   3 months ago UTI symptoms   [facility]   4 months ago UTI symptoms   [facility] Merita Norton T, FNP   11 months ago Traumatic brain injury with loss of consciousness, sequela [facility])   [facility] Merita Norton T, FNP   11 months ago Traumatic brain injury with loss of consciousness, sequela [facility])   [facility] Merita Norton T, FNP               metoprolol succinate (TOPROL-XL) 25 MG 24 hr tablet 100 tablet 0    Sig: Take 1 tablet (25 mg total) by mouth daily.     Cardiovascular:  Beta Blockers Passed - 10/11/2023 12:16 PM      Passed - Last BP in normal range    BP Readings from Last 1 Encounters:  09/24/23 108/73         Passed - Last Heart Rate in normal range    Pulse Readings from Last 1 Encounters:  09/24/23 63         Passed - Valid encounter within last 6 months    Recent Outpatient Visits           1 month ago Other hemorrhoids   [place] [facility] [place], [place], PA-C   3 months ago UTI symptoms   [facility] Mal[facility], [facility]   4 months ago UTI symptoms   [facility] Mer[facility] Norton T, FNP   11 months ago Traumatic brain injury with loss of consciousness, sequela [facility])   [place] [facility] Merita Norton T, FNP   11 months ago Traumatic brain injury with loss of consciousness, sequela [facility])   [facility] Jacky Kindle, [place]

## 2023-10-11 NOTE — Telephone Encounter (Signed)
 Copied from CRM [id number]. Topic: Clinical - Medication Question >> Oct 11, 2023  3:52 PM Nyra Capes wrote: Reason for CRM: Barbara Anderson with [employer] patient called in asking for refill of Ozempic 1ml  injection signed by  Merita Norton  at [facility]. This is not listed on her list.

## 2023-10-11 NOTE — Telephone Encounter (Signed)
 Copied from CRM [id number]. Topic: Clinical - Medication Question >> Oct 10, 2023  4:46 PM Martha Clan wrote: Reason for CRM: fluticasone (FLONASE) 50 MCG/ACT nasal spray [[id number]] Patient states the medication is not working and has been getting sores under her nose.

## 2023-10-14 ENCOUNTER — Telehealth: Payer: Self-pay | Admitting: Physician Assistant

## 2023-10-14 NOTE — Telephone Encounter (Signed)
 [facility] faxed refill request for the following medications:   pregabalin (LYRICA) 200 MG capsule     Please advise.

## 2023-10-15 ENCOUNTER — Encounter: Payer: Self-pay | Admitting: Physician Assistant

## 2023-10-15 ENCOUNTER — Telehealth: Payer: Self-pay | Admitting: Physician Assistant

## 2023-10-15 MED ORDER — LEVOCETIRIZINE DIHYDROCHLORIDE 5 MG PO TABS: 5.0000 mg | ORAL_TABLET | Freq: Every evening | ORAL | 2 refills | Status: DC

## 2023-10-15 NOTE — Telephone Encounter (Signed)
 Antihistamine was sent

## 2023-10-15 NOTE — Telephone Encounter (Signed)
 Pt would like a nose spray that will dry up the mucus in her head.  Her nose is constantly running all the time [facility] [address] [place] - [address]    The fluticasone (FLONASE) 50 MCG/ACT nasal spray does not seem to be working at all.   Patient is reporting her current nasal spray is not working- she has constant nasal dripping. Patient used OTC nasal spray but does not know name- did not work. Patient is not taking any oral medication at this time. Patient did take Allegra/Claritin in the past without help. Constant tissue in hand due to constant nasal drainage.  Patient advised appointment for seasonal control- she has ben scheduled

## 2023-10-15 NOTE — Telephone Encounter (Signed)
 Pt would like a nose spray that will dry up the mucus in her head.  Her nose is constantly running all the time [facility] [address] [place] - [address]   The fluticasone (FLONASE) 50 MCG/ACT nasal spray does not seem to be working at all.

## 2023-10-17 ENCOUNTER — Ambulatory Visit: Admitting: Physician Assistant

## 2023-10-23 ENCOUNTER — Telehealth: Payer: Self-pay

## 2023-10-23 ENCOUNTER — Other Ambulatory Visit: Payer: Self-pay | Admitting: Physician Assistant

## 2023-10-23 DIAGNOSIS — M51379 Other intervertebral disc degeneration, lumbosacral region without mention of lumbar back pain or lower extremity pain: Secondary | ICD-10-CM

## 2023-10-23 DIAGNOSIS — M797 Fibromyalgia: Secondary | ICD-10-CM

## 2023-10-23 DIAGNOSIS — M542 Cervicalgia: Secondary | ICD-10-CM

## 2023-10-23 DIAGNOSIS — G8929 Other chronic pain: Secondary | ICD-10-CM

## 2023-10-23 NOTE — Telephone Encounter (Signed)
 Copied from CRM [id number]. Topic: Clinical - Medication Refill >> Oct 23, 2023 11:38 AM Gery Pray wrote: Most Recent Primary Care Visit:  Provider: Debera Lat  Department[facility]  Visit Type: OFFICE VISIT  Date: 09/17/2023  Medication: pregabalin (LYRICA) 200 MG capsule  Has the patient contacted their pharmacy? Yes (Agent: If no, request that the patient contact the pharmacy for the refill. If patient does not wish to contact the pharmacy document the reason why and proceed with request.) (Agent: If yes, when and what did the pharmacy advise?) Pharmacy called to have prescription sent in  Is this the correct pharmacy for this prescription? Yes If no, delete pharmacy and type the correct one.  This is the patient's preferred pharmacy:  [facility] [place] [address] [place] [address] Phone: [phone] Fax: [phone]   Has the prescription been filled recently? No  Is the patient out of the medication? No  Has the patient been seen for an appointment in the last year OR does the patient have an upcoming appointment? Yes  Can we respond through MyChart? Yes  Agent: Please be advised that Rx refills may take up to 3 business days. We ask that you follow-up with your pharmacy.

## 2023-10-23 NOTE — Telephone Encounter (Signed)
 Copied from CRM [id number]. Topic: Clinical - Medication Refill >> Oct 23, 2023 11:51 AM Benetta Spar B wrote: Most Recent Primary Care Visit:  Provider: Debera Lat  Department[facility]  Visit Type: OFFICE VISIT  Date: 09/17/2023  Medication: pregabalin (LYRICA) 200 MG capsule  Has the patient contacted their pharmacy? yes (Agent: If yes, when and what did the pharmacy advise?)pharmacy called in directly  Is this the correct pharmacy for this prescription? yes   This is the patient's preferred pharmacy:  [facility] [place] [address] [place] [address] Phone: [phone] Fax: [phone]   Has the prescription been filled recently? no  Is the patient out of the medication? no  Has the patient been seen for an appointment in the last year OR does the patient have an upcoming appointment? yes  Can we respond through MyChart? yes  Agent: Please be advised that Rx refills may take up to 3 business days. We ask that you follow-up with your pharmacy.

## 2023-10-24 ENCOUNTER — Other Ambulatory Visit: Payer: Self-pay | Admitting: Family Medicine

## 2023-10-24 MED ORDER — PREGABALIN 200 MG PO CAPS: 200.0000 mg | ORAL_CAPSULE | Freq: Two times a day (BID) | ORAL | 0 refills | Status: DC

## 2023-10-24 NOTE — Telephone Encounter (Addendum)
 Accidentally refused refill request for a non delegated med. Routing to office.

## 2023-10-29 ENCOUNTER — Ambulatory Visit (INDEPENDENT_AMBULATORY_CARE_PROVIDER_SITE_OTHER): Payer: Medicare PPO | Admitting: Physician Assistant

## 2023-10-29 ENCOUNTER — Encounter: Payer: Self-pay | Admitting: Physician Assistant

## 2023-10-29 VITALS — BP 130/80 | HR 61 | Resp 16 | Ht 67.0 in | Wt 154.3 lb

## 2023-10-29 DIAGNOSIS — J3089 Other allergic rhinitis: Secondary | ICD-10-CM

## 2023-10-29 DIAGNOSIS — R748 Abnormal levels of other serum enzymes: Secondary | ICD-10-CM

## 2023-10-29 DIAGNOSIS — F4001 Agoraphobia with panic disorder: Secondary | ICD-10-CM

## 2023-10-29 DIAGNOSIS — E782 Mixed hyperlipidemia: Secondary | ICD-10-CM

## 2023-10-29 DIAGNOSIS — I152 Hypertension secondary to endocrine disorders: Secondary | ICD-10-CM

## 2023-10-29 DIAGNOSIS — G894 Chronic pain syndrome: Secondary | ICD-10-CM

## 2023-10-29 DIAGNOSIS — F422 Mixed obsessional thoughts and acts: Secondary | ICD-10-CM

## 2023-10-29 DIAGNOSIS — E034 Atrophy of thyroid (acquired): Secondary | ICD-10-CM

## 2023-10-29 DIAGNOSIS — F319 Bipolar disorder, unspecified: Secondary | ICD-10-CM

## 2023-10-29 DIAGNOSIS — E1159 Type 2 diabetes mellitus with other circulatory complications: Secondary | ICD-10-CM

## 2023-10-29 MED ORDER — LORATADINE 10 MG PO TABS: 10.0000 mg | ORAL_TABLET | Freq: Every day | ORAL | 11 refills | Status: AC

## 2023-10-29 MED ORDER — AZELASTINE HCL 0.1 % NA SOLN: 1.0000 | Freq: Two times a day (BID) | NASAL | 5 refills | Status: AC

## 2023-10-29 MED ORDER — IPRATROPIUM BROMIDE 0.03 % NA SOLN: NASAL | 0 refills | Status: DC

## 2023-10-29 NOTE — Progress Notes (Signed)
 Established patient visit  Patient: Barbara Anderson   DOB: Feb 19, 1971   53 y.o. Female  MRN: [id number] Visit Date: 10/29/2023  Today's healthcare provider: Debera Lat, PA-C   Chief Complaint  Patient presents with   Follow-up    6 wk f/u, Allergies wants a different medication for iit   Subjective     HPI     Follow-up    Additional comments: 6 wk f/u, Allergies wants a different medication for iit      Last edited by Liz Beach, CMA on 10/29/2023  1:15 PM.       Discussed the use of AI scribe software for clinical note transcription with the patient, who gave verbal consent to proceed.  History of Present Illness        10/29/2023    1:24 PM 09/17/2023   11:22 AM 08/28/2023    1:25 PM  Depression screen PHQ 2/9  Decreased Interest 0 2 0  Down, Depressed, Hopeless 0 1 0  PHQ - 2 Score 0 3 0  Altered sleeping 2 2 0  Tired, decreased energy 0 2 0  Change in appetite 0 2 0  Feeling bad or failure about yourself  0 0 0  Trouble concentrating 0 2 0  Moving slowly or fidgety/restless 0 0 0  Suicidal thoughts 0 0 0  PHQ-9 Score 2 11 0  Difficult doing work/chores Not difficult at all Extremely dIfficult Not difficult at all      10/29/2023    1:24 PM 09/17/2023   11:22 AM 06/21/2023    8:20 AM 05/21/2023   11:04 AM  GAD 7 : Generalized Anxiety Score  Nervous, Anxious, on Edge 0 2 0 0  Control/stop worrying 0 2 1 1   Worry too much - different things 0 2 1 1   Trouble relaxing 0 2 0 1  Restless 0 2  0  Easily annoyed or irritable 0 0 0 0  Afraid - awful might happen 0 2 1 1   Total GAD 7 Score 0 12  4  Anxiety Difficulty Not difficult at all Extremely difficult Not difficult at all Somewhat difficult    Medications: Outpatient Medications Prior to Visit  Medication Sig   Adapalene (DIFFERIN) 0.3 % gel Apply 1 application topically at bedtime.   Alcohol Swabs (B-D SINGLE USE SWABS REGULAR) PADS USE TO CLEANSE SKIN EVERY DAY BEFORE CHECKING BLOOD SUGAR    amphetamine-dextroamphetamine (ADDERALL) 20 MG tablet Take 20 mg by mouth in the morning, at noon, and at bedtime. Has not started yet 11/10/19   atorvastatin (LIPITOR) 20 MG tablet TAKE ONE TABLET (20 MG TOTAL) BY MOUTH DAILY   carboxymethylcellulose (LUBRICANT EYE DROPS) 0.5 % SOLN Place 1 drop into the left eye in the morning and at bedtime. For two weeks.   Cholecalciferol (VITAMIN D3) 25 MCG (1000 UT) CAPS Take 1 capsule (1,000 Units total) by mouth daily.   DULoxetine (CYMBALTA) 60 MG capsule Take 2 capsules (120 mg total) by mouth daily.   etonogestrel (NEXPLANON) 68 MG IMPL implant 1 each by Subdermal route once.   ferrous sulfate 325 (65 FE) MG tablet Take by mouth.   folic acid (FOLVITE) 800 MCG tablet Take 1 tablet (800 mcg total) by mouth daily.   gabapentin (NEURONTIN) 300 MG capsule Take 1 capsule (300 mg total) by mouth at bedtime.   hydrocortisone (ANUSOL-HC) 2.5 % rectal cream Place 1 Application rectally 2 (two) times daily.   hydroquinone 2 % cream Apply topically at bedtime.  Lancets Misc. (ACCU-CHEK FASTCLIX LANCET) KIT To check blood sugar once daily   levocetirizine (XYZAL) 5 MG tablet Take 1 tablet (5 mg total) by mouth every evening.   levothyroxine (SYNTHROID) 25 MCG tablet TAKE ONE TABLET (25 MCG TOTAL) BY MOUTH DAILY BEFORE BREAKFAST   lisinopril (ZESTRIL) 5 MG tablet TAKE ONE TABLET (5 MG TOTAL) BY MOUTH DAILY   metoCLOPramide (REGLAN) 10 MG tablet Take by mouth.   metoprolol succinate (TOPROL-XL) 25 MG 24 hr tablet TAKE ONE TABLET (25 MG TOTAL) BY MOUTH DAILY   Multiple Vitamin tablet Take 1 tablet by mouth daily.   Olopatadine HCl 0.2 % SOLN Apply 1 drop to eye daily. One drop to L eye twice daily for two weeks.   pregabalin (LYRICA) 200 MG capsule Take 1 capsule (200 mg total) by mouth 2 (two) times daily.   ramelteon (ROZEREM) 8 MG tablet TAKE ONE TABLET BY MOUTH EVERY DAY AT BEDTIME   spironolactone (ALDACTONE) 25 MG tablet TAKE ONE TABLET (25 MG TOTAL) BY MOUTH  DAILY   tiZANidine (ZANAFLEX) 2 MG tablet Take 1 tablet (2 mg total) by mouth 3 (three) times daily as needed for muscle spasms.   topiramate (TOPAMAX) 200 MG tablet Take 1 tablet (200 mg total) by mouth at bedtime.   traZODone (DESYREL) 150 MG tablet Take 1 tablet (150 mg total) by mouth at bedtime.   Vitamin D, Ergocalciferol, (DRISDOL) 1.25 MG (50000 UNIT) CAPS capsule Take 1 capsule (50,000 Units total) by mouth every 7 (seven) days. Take with meal.   VRAYLAR 1.5 MG capsule Take 1.5 mg by mouth daily.   ALPRAZolam (XANAX) 1 MG tablet Take 1 mg by mouth 2 (two) times daily as needed.   fluticasone (FLONASE) 50 MCG/ACT nasal spray USE 2 SPRAYS IN BOTH NOSTRILS  DAILY   QUEtiapine Fumarate (SEROQUEL XR) 150 MG 24 hr tablet Take 150 mg by mouth at bedtime. (Patient not taking: Reported on 10/29/2023)   No facility-administered medications prior to visit.    Review of Systems  All other systems reviewed and are negative.  All negative Except see HPI   {Insert previous labs (optional):23779} {See past labs  Heme  Chem  Endocrine  Serology  Results Review (optional):1}   Objective    BP 130/80 (BP Location: Right Arm, Patient Position: Sitting)   Pulse 61   Resp 16   Ht 5\' 7"  (1.702 m)   Wt 154 lb 4.8 oz (70 kg)   SpO2 99%   BMI 24.17 kg/m  {Insert last BP/Wt (optional):23777}{See vitals history (optional):1}   Physical Exam Vitals reviewed.  Constitutional:      General: She is not in acute distress.    Appearance: Normal appearance. She is well-developed. She is not diaphoretic.  HENT:     Head: Normocephalic and atraumatic.  Eyes:     General: No scleral icterus.    Conjunctiva/sclera: Conjunctivae normal.  Neck:     Thyroid: No thyromegaly.  Cardiovascular:     Rate and Rhythm: Normal rate and regular rhythm.     Pulses: Normal pulses.     Heart sounds: Normal heart sounds. No murmur heard. Pulmonary:     Effort: Pulmonary effort is normal. No respiratory  distress.     Breath sounds: Normal breath sounds. No wheezing, rhonchi or rales.  Musculoskeletal:     Cervical back: Neck supple.     Right lower leg: No edema.     Left lower leg: No edema.  Lymphadenopathy:  Cervical: No cervical adenopathy.  Skin:    General: Skin is warm and dry.     Findings: No rash.  Neurological:     Mental Status: She is alert and oriented to person, place, and time. Mental status is at baseline.  Psychiatric:        Mood and Affect: Mood normal.        Behavior: Behavior normal.      No results found for any visits on 10/29/23.      Assessment and Plan Assessment & Plan     No orders of the defined types were placed in this encounter.   No follow-ups on file.   The patient was advised to call back or seek an in-person evaluation if the symptoms worsen or if the condition fails to improve as anticipated.  I discussed the assessment and treatment plan with the patient. The patient was provided an opportunity to ask questions and all were answered. The patient agreed with the plan and demonstrated an understanding of the instructions.  I, Debera Lat, PA-C have reviewed all documentation for this visit. The documentation on 10/29/2023  for the exam, diagnosis, procedures, and orders are all accurate and complete.  Debera Lat, [facility], MMS [facility] [phone] (phone) [phone] (fax)  [facility]

## 2023-11-01 NOTE — Telephone Encounter (Signed)
 [facility] [place] - [address] Phone: [phone]  Fax: [phone]     pharmacy faxed refill request for the following medications   pregabalin (LYRICA) 200 MG capsule   Please advise

## 2023-11-04 ENCOUNTER — Encounter: Payer: Self-pay | Admitting: Physician Assistant

## 2023-11-04 DIAGNOSIS — F3132 Bipolar disorder, current episode depressed, moderate: Secondary | ICD-10-CM | POA: Diagnosis not present

## 2023-11-04 DIAGNOSIS — F411 Generalized anxiety disorder: Secondary | ICD-10-CM | POA: Diagnosis not present

## 2023-11-04 DIAGNOSIS — F4312 Post-traumatic stress disorder, chronic: Secondary | ICD-10-CM | POA: Diagnosis not present

## 2023-11-04 DIAGNOSIS — F902 Attention-deficit hyperactivity disorder, combined type: Secondary | ICD-10-CM | POA: Diagnosis not present

## 2023-11-04 MED ORDER — PREGABALIN 200 MG PO CAPS: 200.0000 mg | ORAL_CAPSULE | Freq: Every day | ORAL | 0 refills | Status: DC

## 2023-11-04 NOTE — Telephone Encounter (Signed)
 A courtesy refill of pregabalin. I will decrease a dose of pregabalin. Please, remember to discuss the pain control with [facility]

## 2023-11-06 DIAGNOSIS — R7309 Other abnormal glucose: Secondary | ICD-10-CM | POA: Diagnosis not present

## 2023-11-10 DIAGNOSIS — I10 Essential (primary) hypertension: Secondary | ICD-10-CM | POA: Diagnosis not present

## 2023-11-10 DIAGNOSIS — R413 Other amnesia: Secondary | ICD-10-CM | POA: Diagnosis not present

## 2023-11-10 DIAGNOSIS — F3181 Bipolar II disorder: Secondary | ICD-10-CM | POA: Diagnosis not present

## 2023-11-20 ENCOUNTER — Other Ambulatory Visit: Payer: Self-pay | Admitting: Physician Assistant

## 2023-11-20 DIAGNOSIS — E782 Mixed hyperlipidemia: Secondary | ICD-10-CM

## 2023-11-20 DIAGNOSIS — L700 Acne vulgaris: Secondary | ICD-10-CM

## 2023-11-20 DIAGNOSIS — E559 Vitamin D deficiency, unspecified: Secondary | ICD-10-CM

## 2023-11-20 DIAGNOSIS — E034 Atrophy of thyroid (acquired): Secondary | ICD-10-CM

## 2023-11-20 DIAGNOSIS — E1159 Type 2 diabetes mellitus with other circulatory complications: Secondary | ICD-10-CM

## 2023-11-21 NOTE — Telephone Encounter (Signed)
 Requested medication (s) are due for refill today: yes  Requested medication (s) are on the active medication list: yes  Last refill:  04/15/23 #26  Future visit scheduled: yes  Notes to clinic:  med not delegated to NT to RF   Requested Prescriptions  Pending Prescriptions Disp Refills   Vitamin D , Ergocalciferol , (DRISDOL ) 1.25 MG (50000 UNIT) CAPS capsule 26 capsule     Sig: Take 1 capsule (50,000 Units total) by mouth every 7 (seven) days. Take with meal.     Endocrinology:  Vitamins - Vitamin D  Supplementation 2 Failed - 11/21/2023  3:03 PM      Failed - Manual Review: Route requests for 50,000 IU strength to the provider      Passed - Ca in normal range and within 360 days    Calcium   Date Value Ref Range Status  09/04/2023 8.7 8.7 - 10.2 mg/dL Final   Calcium , Total  Date Value Ref Range Status  05/12/2014 8.1 (L) 8.5 - 10.1 mg/dL Final         Passed - Vitamin D  in normal range and within 360 days    Vit D, 25-Hydroxy  Date Value Ref Range Status  05/13/2023 64.0  Final         Passed - Valid encounter within last 12 months    Recent Outpatient Visits           3 weeks ago Non-seasonal allergic rhinitis due to other allergic trigger   [place] [facility] [place], [place], PA-C   2 months ago Hypertension associated with diabetes [facility])   [place] [facility] [place], [place], PA-C   2 months ago Pink eye, left   [place] [facility] [place], [place] A, FNP   2 months ago Other specified hypotension   [place] [facility] [place], [employer], PA-C              Refused Prescriptions Disp Refills   atorvastatin  (LIPITOR) 20 MG tablet 100 tablet 0     Cardiovascular:  Antilipid - Statins Failed - 11/21/2023  3:03 PM      Failed - Lipid Panel in normal range within the last 12 months    Cholesterol, Total  Date Value Ref Range Status  09/04/2023 103 100 - 199 mg/dL Final   LDL Chol Calc  (NIH)  Date Value Ref Range Status  09/04/2023 49 0 - 99 mg/dL Final   HDL  Date Value Ref Range Status  09/04/2023 39 (L) >39 mg/dL Final   Triglycerides  Date Value Ref Range Status  09/04/2023 72 0 - 149 mg/dL Final         Passed - Patient is not pregnant      Passed - Valid encounter within last 12 months    Recent Outpatient Visits           3 weeks ago Non-seasonal allergic rhinitis due to other allergic trigger   [place] [facility] [place], [place], PA-C   2 months ago Hypertension associated with diabetes [facility])   [place] [facility] [place], [place], PA-C   2 months ago Pink eye, left   [place] [facility] [place], [place] A, FNP   2 months ago Other specified hypotension   [facility] [place], Janna, PA-C               spironolactone  (ALDACTONE ) 25 MG tablet 100 tablet 0     Cardiovascular:  Diuretics - Aldosterone Antagonist Passed - 11/21/2023  3:03 PM      Passed - Cr in normal range and within 180 days    Creatinine  Date Value Ref Range Status  05/12/2014 0.88 0.60 - 1.30 mg/dL Final   Creat  Date Value Ref Range Status  04/22/2014 0.79 0.50 - 1.10 mg/dL Final   Creatinine, Ser  Date Value Ref Range Status  09/04/2023 0.97 0.57 - 1.00 mg/dL Final         Passed - K in normal range and within 180 days    Potassium  Date Value Ref Range Status  09/04/2023 3.9 3.5 - 5.2 mmol/L Final  05/12/2014 3.0 (L) 3.5 - 5.1 mmol/L Final         Passed - Na in normal range and within 180 days    Sodium  Date Value Ref Range Status  09/04/2023 142 134 - 144 mmol/L Final  05/12/2014 144 136 - 145 mmol/L Final         Passed - eGFR is 30 or above and within 180 days    EGFR (African American)  Date Value Ref Range Status  10/21/2013 >60  Final   GFR calc Af Amer  Date Value Ref Range Status  02/04/2020 76 >59 mL/min/1.73 Final    Comment:    **[facility] currently reports  eGFR in compliance with the current**   recommendations of the [employer]. [facility] will   update reporting as new guidelines are published from the NKF-ASN   Task force.    EGFR (Non-African Amer.)  Date Value Ref Range Status  10/21/2013 >60  Final    Comment:    eGFR values <14mL/min/1.73 m2 may be an indication of chronic kidney disease (CKD). Calculated eGFR is useful in patients with stable renal function. The eGFR calculation will not be reliable in acutely ill patients when serum creatinine is changing rapidly. It is not useful in  patients on dialysis. The eGFR calculation may not be applicable to patients at the low and high extremes of body sizes, pregnant women, and vegetarians.    GFR calc non Af Amer  Date Value Ref Range Status  02/04/2020 66 >59 mL/min/1.73 Final   eGFR  Date Value Ref Range Status  09/04/2023 69 >59 mL/min/1.73 Final         Passed - Last BP in normal range    BP Readings from Last 1 Encounters:  10/29/23 130/80         Passed - Valid encounter within last 6 months    Recent Outpatient Visits           3 weeks ago Non-seasonal allergic rhinitis due to other allergic trigger   [place] [facility] [place], [place], PA-C   2 months ago Hypertension associated with diabetes [facility])   [place] [facility] [place], [place], PA-C   2 months ago Pink eye, left   [facility] [place], [place] A, FNP   2 months ago Other specified hypotension   [place] [facility] [place], J[place], PA-C               lisinopril  (ZESTRIL ) 5 MG tablet 100 tablet 0     Cardiovascular:  ACE Inhibitors Passed - 11/21/2023  3:03 PM      Passed - Cr in normal range and within 180 days    Creatinine  Date Value Ref Range Status  05/12/2014 0.88 0.60 - 1.30 mg/dL Final  Creat  Date Value Ref Range Status  04/22/2014 0.79 0.50 - 1.10 mg/dL Final   Creatinine, Ser   Date Value Ref Range Status  09/04/2023 0.97 0.57 - 1.00 mg/dL Final         Passed - K in normal range and within 180 days    Potassium  Date Value Ref Range Status  09/04/2023 3.9 3.5 - 5.2 mmol/L Final  05/12/2014 3.0 (L) 3.5 - 5.1 mmol/L Final         Passed - Patient is not pregnant      Passed - Last BP in normal range    BP Readings from Last 1 Encounters:  10/29/23 130/80         Passed - Valid encounter within last 6 months    Recent Outpatient Visits           3 weeks ago Non-seasonal allergic rhinitis due to other allergic trigger   [place] [facility] [place], [place], PA-C   2 months ago Hypertension associated with diabetes [facility])   [facility] [place], [place], PA-C   2 months ago Pink eye, left   [place] [facility] [place], [place] A, FNP   2 months ago Other specified hypotension   [place] [facility] [place], Janna, PA-C               levothyroxine  (SYNTHROID ) 25 MCG tablet 100 tablet 0     Endocrinology:  Hypothyroid Agents Passed - 11/21/2023  3:03 PM      Passed - TSH in normal range and within 360 days    TSH  Date Value Ref Range Status  09/04/2023 1.570 0.450 - 4.500 uIU/mL Final         Passed - Valid encounter within last 12 months    Recent Outpatient Visits           3 weeks ago Non-seasonal allergic rhinitis due to other allergic trigger   [place] [facility] [place], [place], PA-C   2 months ago Hypertension associated with diabetes [facility])   [place] [facility] [place], [place], PA-C   2 months ago Pink eye, left   [place] [facility] [place], [place] A, FNP   2 months ago Other specified hypotension   [facility] [place], Janna, PA-C               metoprolol  succinate (TOPROL -XL) 25 MG 24 hr tablet 100 tablet 0     Cardiovascular:  Beta Blockers Passed - 11/21/2023   3:03 PM      Passed - Last BP in normal range    BP Readings from Last 1 Encounters:  10/29/23 130/80         Passed - Last Heart Rate in normal range    Pulse Readings from Last 1 Encounters:  10/29/23 61         Passed - Valid encounter within last 6 months    Recent Outpatient Visits           3 weeks ago Non-seasonal allergic rhinitis due to other allergic trigger   [place] [facility] [place], [place], PA-C   2 months ago Hypertension associated with diabetes [facility])   [place] [facility] [place], [place], PA-C   2 months ago Pink eye, left   [facility], [place] A, [place]   2 months ago Other specified hypotension  [facility] [place], [place], PA-C

## 2023-11-21 NOTE — Telephone Encounter (Signed)
 Requested Prescriptions  Pending Prescriptions Disp Refills   atorvastatin  (LIPITOR) 20 MG tablet 100 tablet 0     Cardiovascular:  Antilipid - Statins Failed - 11/21/2023  3:00 PM      Failed - Lipid Panel in normal range within the last 12 months    Cholesterol, Total  Date Value Ref Range Status  09/04/2023 103 100 - 199 mg/dL Final   LDL Chol Calc (NIH)  Date Value Ref Range Status  09/04/2023 49 0 - 99 mg/dL Final   HDL  Date Value Ref Range Status  09/04/2023 39 (L) >39 mg/dL Final   Triglycerides  Date Value Ref Range Status  09/04/2023 72 0 - 149 mg/dL Final         Passed - Patient is not pregnant      Passed - Valid encounter within last 12 months    Recent Outpatient Visits           3 weeks ago Non-seasonal allergic rhinitis due to other allergic trigger   [facility] [place], [place], PA-C   2 months ago Hypertension associated with diabetes [facility])   [facility] [place], [place], PA-C   2 months ago Pink eye, left   [facility] [place], [place] A, FNP   2 months ago Other specified hypotension   [place] [facility] [place], Janna, PA-C               Vitamin D , Ergocalciferol , (DRISDOL ) 1.25 MG (50000 UNIT) CAPS capsule 26 capsule     Sig: Take 1 capsule (50,000 Units total) by mouth every 7 (seven) days. Take with meal.     Endocrinology:  Vitamins - Vitamin D  Supplementation 2 Failed - 11/21/2023  3:00 PM      Failed - Manual Review: Route requests for 50,000 IU strength to the provider      Passed - Ca in normal range and within 360 days    Calcium   Date Value Ref Range Status  09/04/2023 8.7 8.7 - 10.2 mg/dL Final   Calcium , Total  Date Value Ref Range Status  05/12/2014 8.1 (L) 8.5 - 10.1 mg/dL Final         Passed - Vitamin D  in normal range and within 360 days    Vit D, 25-Hydroxy  Date Value Ref Range Status  05/13/2023 64.0  Final          Passed - Valid encounter within last 12 months    Recent Outpatient Visits           3 weeks ago Non-seasonal allergic rhinitis due to other allergic trigger   [facility] [place], [place], PA-C   2 months ago Hypertension associated with diabetes [facility])   [facility] [place], [place], PA-C   2 months ago Pink eye, left   [facility] [place], [place] A, FNP   2 months ago Other specified hypotension    [facility] [place], Janna, PA-C               spironolactone  (ALDACTONE ) 25 MG tablet 100 tablet 0     Cardiovascular: Diuretics - Aldosterone Antagonist Passed - 11/21/2023  3:00 PM      Passed - Cr in normal range and within 180 days    Creatinine  Date Value Ref Range Status  05/12/2014 0.88 0.60 - 1.30 mg/dL Final   Creat  Date Value Ref  Range Status  04/22/2014 0.79 0.50 - 1.10 mg/dL Final   Creatinine, Ser  Date Value Ref Range Status  09/04/2023 0.97 0.57 - 1.00 mg/dL Final         Passed - K in normal range and within 180 days    Potassium  Date Value Ref Range Status  09/04/2023 3.9 3.5 - 5.2 mmol/L Final  05/12/2014 3.0 (L) 3.5 - 5.1 mmol/L Final         Passed - Na in normal range and within 180 days    Sodium  Date Value Ref Range Status  09/04/2023 142 134 - 144 mmol/L Final  05/12/2014 144 136 - 145 mmol/L Final         Passed - eGFR is 30 or above and within 180 days    EGFR (African American)  Date Value Ref Range Status  10/21/2013 >60  Final   GFR calc Af Amer  Date Value Ref Range Status  02/04/2020 76 >59 mL/min/1.73 Final    Comment:    **[facility] currently reports eGFR in compliance with the current**   recommendations of the [employer]. [facility] will   update reporting as new guidelines are published from the NKF-ASN   Task force.    EGFR (Non-African Amer.)  Date Value Ref Range Status  10/21/2013 >60  Final     Comment:    eGFR values <17mL/min/1.73 m2 may be an indication of chronic kidney disease (CKD). Calculated eGFR is useful in patients with stable renal function. The eGFR calculation will not be reliable in acutely ill patients when serum creatinine is changing rapidly. It is not useful in  patients on dialysis. The eGFR calculation may not be applicable to patients at the low and high extremes of body sizes, pregnant women, and vegetarians.    GFR calc non Af Amer  Date Value Ref Range Status  02/04/2020 66 >59 mL/min/1.73 Final   eGFR  Date Value Ref Range Status  09/04/2023 69 >59 mL/min/1.73 Final         Passed - Last BP in normal range    BP Readings from Last 1 Encounters:  10/29/23 130/80         Passed - Valid encounter within last 6 months    Recent Outpatient Visits           3 weeks ago Non-seasonal allergic rhinitis due to other allergic trigger   [place] [place], [place] [address], PA-C   2 months ago Hypertension associated with diabetes [facility])   [facility] [place], [place], PA-C   2 months ago Pink eye, left   [facility] [place], [place] A, FNP   2 months ago Other specified hypotension   [facility] [place], Janna, PA-C               lisinopril  (ZESTRIL ) 5 MG tablet 100 tablet 0     Cardiovascular:  ACE Inhibitors Passed - 11/21/2023  3:00 PM      Passed - Cr in normal range and within 180 days    Creatinine  Date Value Ref Range Status  05/12/2014 0.88 0.60 - 1.30 mg/dL Final   Creat  Date Value Ref Range Status  04/22/2014 0.79 0.50 - 1.10 mg/dL Final   Creatinine, Ser  Date Value Ref Range Status  09/04/2023 0.97 0.57 - 1.00 mg/dL Final         Passed - K in normal range and  within 180 days    Potassium  Date Value Ref Range Status  09/04/2023 3.9 3.5 - 5.2 mmol/L Final  05/12/2014 3.0 (L) 3.5 - 5.1 mmol/L Final         Passed -  Patient is not pregnant      Passed - Last BP in normal range    BP Readings from Last 1 Encounters:  10/29/23 130/80         Passed - Valid encounter within last 6 months    Recent Outpatient Visits           3 weeks ago Non-seasonal allergic rhinitis due to other allergic trigger   [facility] [place], [place], PA-C   2 months ago Hypertension associated with diabetes [facility])   [facility] [place], [place], PA-C   2 months ago Pink eye, left   [facility] [place], [place] A, FNP   2 months ago Other specified hypotension   [facility] [place], Janna, PA-C               levothyroxine  (SYNTHROID ) 25 MCG tablet 100 tablet 0     Endocrinology:  Hypothyroid Agents Passed - 11/21/2023  3:00 PM      Passed - TSH in normal range and within 360 days    TSH  Date Value Ref Range Status  09/04/2023 1.570 0.450 - 4.500 uIU/mL Final         Passed - Valid encounter within last 12 months    Recent Outpatient Visits           3 weeks ago Non-seasonal allergic rhinitis due to other allergic trigger   [place] [facility] [place], [place], PA-C   2 months ago Hypertension associated with diabetes [facility])   [facility] [place], [place], PA-C   2 months ago Pink eye, left   [facility] [facility] [place], [place] A, FNP   2 months ago Other specified hypotension   [facility] [place], Janna, PA-C               metoprolol  succinate (TOPROL -XL) 25 MG 24 hr tablet 100 tablet 0     Cardiovascular:  Beta Blockers Passed - 11/21/2023  3:00 PM      Passed - Last BP in normal range    BP Readings from Last 1 Encounters:  10/29/23 130/80         Passed - Last Heart Rate in normal range    Pulse Readings from Last 1 Encounters:  10/29/23 61         Passed - Valid encounter within last 6 months     Recent Outpatient Visits           3 weeks ago Non-seasonal allergic rhinitis due to other allergic trigger   [place] [place], [place], PA-C   2 months ago Hypertension associated with diabetes [facility])   [facility] [place], [place], PA-C   2 months ago Pink eye, left   [facility] [place], Ronny [place]as, FNP   2 months ago Other specified hypotension   [facility] [place], Janna, PA-C

## 2023-11-22 ENCOUNTER — Other Ambulatory Visit: Payer: Self-pay | Admitting: Physician Assistant

## 2023-11-22 DIAGNOSIS — M797 Fibromyalgia: Secondary | ICD-10-CM

## 2023-11-22 DIAGNOSIS — M51379 Other intervertebral disc degeneration, lumbosacral region without mention of lumbar back pain or lower extremity pain: Secondary | ICD-10-CM

## 2023-11-22 DIAGNOSIS — G8929 Other chronic pain: Secondary | ICD-10-CM

## 2023-11-22 DIAGNOSIS — M542 Cervicalgia: Secondary | ICD-10-CM

## 2023-11-22 NOTE — Telephone Encounter (Signed)
 Requested medication (s) are due for refill today: Yes  Requested medication (s) are on the active medication list: Yes  Last refill:  11/04/23  Future visit scheduled: Yes  Notes to clinic:  Unable to refill per protocol, cannot delegate.      Requested Prescriptions  Pending Prescriptions Disp Refills   pregabalin  (LYRICA ) 200 MG capsule 60 capsule 0    Sig: Take 1 capsule (200 mg total) by mouth daily.     Not Delegated - Neurology:  Anticonvulsants - Controlled - pregabalin  Failed - 11/22/2023  1:23 PM      Failed - This refill cannot be delegated      Passed - Cr in normal range and within 360 days    Creatinine  Date Value Ref Range Status  05/12/2014 0.88 0.60 - 1.30 mg/dL Final   Creat  Date Value Ref Range Status  04/22/2014 0.79 0.50 - 1.10 mg/dL Final   Creatinine, Ser  Date Value Ref Range Status  09/04/2023 0.97 0.57 - 1.00 mg/dL Final         Passed - Completed PHQ-2 or PHQ-9 in the last 360 days      Passed - Valid encounter within last 12 months    Recent Outpatient Visits           3 weeks ago Non-seasonal allergic rhinitis due to other allergic trigger   [place] [facility] [place], [place], PA-C   2 months ago Hypertension associated with diabetes [facility])   [facility] [place], [place], PA-C   2 months ago Pink eye, left   [facility] [place], Ronny Colas, FNP   2 months ago Other specified hypotension   [facility] [place], Janna, PA-C

## 2023-11-22 NOTE — Telephone Encounter (Signed)
 Copied from CRM [id number]. Topic: Clinical - Medication Refill >> Nov 22, 2023  9:07 AM Alpha Arts wrote: Most Recent Primary Care Visit:  Provider: OSTWALT, JANNA  Department: [facility]  Visit Type: OFFICE VISIT  Date: 10/29/2023  Medication: pregabalin  (LYRICA ) 200 MG capsule  Has the patient contacted their pharmacy? Yes (Agent: If no, request that the patient contact the pharmacy for the refill. If patient does not wish to contact the pharmacy document the reason why and proceed with request.) (Agent: If yes, when and what did the pharmacy advise?) Pharmacy called  Is this the correct pharmacy for this prescription? Yes If no, delete pharmacy and type the correct one.  This is the patient's preferred pharmacy:   [facility] [place] [address] [place] [address] Phone: [phone] Fax: [phone]  Has the prescription been filled recently? Yes  Is the patient out of the medication? Yes  Has the patient been seen for an appointment in the last year OR does the patient have an upcoming appointment? Yes  Can we respond through MyChart? Yes  Agent: Please be advised that Rx refills may take up to 3 business days. We ask that you follow-up with your pharmacy.

## 2023-12-11 DIAGNOSIS — R413 Other amnesia: Secondary | ICD-10-CM | POA: Diagnosis not present

## 2023-12-11 DIAGNOSIS — I10 Essential (primary) hypertension: Secondary | ICD-10-CM | POA: Diagnosis not present

## 2023-12-11 DIAGNOSIS — F3181 Bipolar II disorder: Secondary | ICD-10-CM | POA: Diagnosis not present

## 2023-12-12 ENCOUNTER — Other Ambulatory Visit: Payer: Self-pay | Admitting: Physician Assistant

## 2023-12-12 DIAGNOSIS — J0140 Acute pansinusitis, unspecified: Secondary | ICD-10-CM

## 2023-12-28 ENCOUNTER — Other Ambulatory Visit: Payer: Self-pay | Admitting: Physician Assistant

## 2023-12-28 DIAGNOSIS — M797 Fibromyalgia: Secondary | ICD-10-CM

## 2023-12-28 DIAGNOSIS — M542 Cervicalgia: Secondary | ICD-10-CM

## 2023-12-28 DIAGNOSIS — G8929 Other chronic pain: Secondary | ICD-10-CM

## 2023-12-28 DIAGNOSIS — M51379 Other intervertebral disc degeneration, lumbosacral region without mention of lumbar back pain or lower extremity pain: Secondary | ICD-10-CM

## 2024-01-01 ENCOUNTER — Telehealth: Payer: Self-pay | Admitting: Physician Assistant

## 2024-01-01 DIAGNOSIS — F4312 Post-traumatic stress disorder, chronic: Secondary | ICD-10-CM | POA: Diagnosis not present

## 2024-01-01 DIAGNOSIS — F411 Generalized anxiety disorder: Secondary | ICD-10-CM | POA: Diagnosis not present

## 2024-01-01 DIAGNOSIS — F3132 Bipolar disorder, current episode depressed, moderate: Secondary | ICD-10-CM | POA: Diagnosis not present

## 2024-01-01 DIAGNOSIS — F902 Attention-deficit hyperactivity disorder, combined type: Secondary | ICD-10-CM | POA: Diagnosis not present

## 2024-01-01 NOTE — Telephone Encounter (Signed)
 [facility] pharmacy faxed refill request for the following medications:   pregabalin  (LYRICA ) 200 MG capsule    Please advise  Prescription was sent to [facility]

## 2024-01-08 ENCOUNTER — Other Ambulatory Visit: Payer: Self-pay | Admitting: Physician Assistant

## 2024-01-08 DIAGNOSIS — I152 Hypertension secondary to endocrine disorders: Secondary | ICD-10-CM

## 2024-01-08 DIAGNOSIS — E034 Atrophy of thyroid (acquired): Secondary | ICD-10-CM

## 2024-01-08 DIAGNOSIS — L700 Acne vulgaris: Secondary | ICD-10-CM

## 2024-01-08 DIAGNOSIS — E782 Mixed hyperlipidemia: Secondary | ICD-10-CM

## 2024-01-12 DIAGNOSIS — F3181 Bipolar II disorder: Secondary | ICD-10-CM | POA: Diagnosis not present

## 2024-01-12 DIAGNOSIS — R413 Other amnesia: Secondary | ICD-10-CM | POA: Diagnosis not present

## 2024-01-12 DIAGNOSIS — I10 Essential (primary) hypertension: Secondary | ICD-10-CM | POA: Diagnosis not present

## 2024-01-13 DIAGNOSIS — R413 Other amnesia: Secondary | ICD-10-CM | POA: Diagnosis not present

## 2024-01-13 DIAGNOSIS — F3181 Bipolar II disorder: Secondary | ICD-10-CM | POA: Diagnosis not present

## 2024-01-13 DIAGNOSIS — I10 Essential (primary) hypertension: Secondary | ICD-10-CM | POA: Diagnosis not present

## 2024-01-15 ENCOUNTER — Telehealth: Payer: Self-pay | Admitting: Physician Assistant

## 2024-01-15 ENCOUNTER — Other Ambulatory Visit: Payer: Self-pay

## 2024-01-15 DIAGNOSIS — G8929 Other chronic pain: Secondary | ICD-10-CM

## 2024-01-15 DIAGNOSIS — M542 Cervicalgia: Secondary | ICD-10-CM

## 2024-01-15 DIAGNOSIS — M797 Fibromyalgia: Secondary | ICD-10-CM

## 2024-01-15 DIAGNOSIS — M51379 Other intervertebral disc degeneration, lumbosacral region without mention of lumbar back pain or lower extremity pain: Secondary | ICD-10-CM

## 2024-01-15 MED ORDER — PREGABALIN 200 MG PO CAPS: 200.0000 mg | ORAL_CAPSULE | Freq: Every day | ORAL | 0 refills | Status: DC

## 2024-01-15 NOTE — Telephone Encounter (Signed)
 [facility] faxed refill request for the following medications:  pregabalin  (LYRICA ) 200 MG capsule     Please advise

## 2024-01-15 NOTE — Telephone Encounter (Signed)
Converted to refill request 

## 2024-01-28 ENCOUNTER — Ambulatory Visit: Admitting: Physician Assistant

## 2024-02-06 ENCOUNTER — Other Ambulatory Visit: Payer: Self-pay | Admitting: Family Medicine

## 2024-02-06 DIAGNOSIS — M51379 Other intervertebral disc degeneration, lumbosacral region without mention of lumbar back pain or lower extremity pain: Secondary | ICD-10-CM

## 2024-02-06 DIAGNOSIS — G8929 Other chronic pain: Secondary | ICD-10-CM

## 2024-02-06 DIAGNOSIS — M797 Fibromyalgia: Secondary | ICD-10-CM

## 2024-02-06 DIAGNOSIS — M542 Cervicalgia: Secondary | ICD-10-CM

## 2024-02-07 NOTE — Telephone Encounter (Signed)
 Med was sent to your pharmacy However, you have been taking gabapentin  as well ; concomitant use of gabapentin  and pregabalin  can be prescribed but may cause excessive sedation, somnolence, and respiratory depression;  Advised to reassess/adjust doses of both medications at the follow-up. Watch for symptoms of drowsiness, dizziness, problems with breathing

## 2024-02-10 ENCOUNTER — Encounter: Payer: Self-pay | Admitting: Family Medicine

## 2024-02-10 ENCOUNTER — Ambulatory Visit (INDEPENDENT_AMBULATORY_CARE_PROVIDER_SITE_OTHER): Admitting: Family Medicine

## 2024-02-10 VITALS — BP 110/78 | HR 81 | Ht 67.0 in | Wt 190.0 lb

## 2024-02-10 DIAGNOSIS — L309 Dermatitis, unspecified: Secondary | ICD-10-CM | POA: Diagnosis not present

## 2024-02-10 MED ORDER — HYDROCORTISONE 2.5 % EX OINT: TOPICAL_OINTMENT | Freq: Two times a day (BID) | CUTANEOUS | 1 refills | Status: AC

## 2024-02-10 NOTE — Progress Notes (Signed)
 ACUTE VISIT   Patient: Barbara Anderson   DOB: 1971-01-02   53 y.o. Female  MRN: [id number]   PCP: Ostwalt, Janna, PA-C  Chief Complaint  Patient presents with   Rash    On her face and chest its been going on last Thursday, its painful when she touch it    Subjective    HPI HPI     Rash    Additional comments: On her face and chest its been going on last Thursday, its painful when she touch it       Last edited by Thelbert Eulalio HERO, CMA on 02/10/2024 10:55 AM.       Discussed the use of AI scribe software for clinical note transcription with the patient, who gave verbal consent to proceed.  History of Present Illness Barbara Anderson is a 53 year old female who presents with a painful rash on her face and chest.  The rash began five days ago, initially appearing on her chest and then spreading to her face, neck, and hands. It is painful to touch but not itchy, and there is no heat sensation in the affected areas. She noticed the rash after taking a shower.  There have been no new medications, lotions, soaps, or beauty creams prior to the onset of the rash. The only recent change in her routine was mowing the yard without sunscreen, during which she was sweating a lot around her neck.  She has not applied any treatments to the rash due to concerns about further irritation. She uses hydroquinone  cream every morning on dark spots on her face but not on her chest or neck.  Her current medications include Adderall, atorvastatin , duloxetine , Nexplanon , Flonase , gabapentin , and Synthroid . No new medications have been started recently.  No itching, heat sensation, or new medications prior to the rash. No mouth itching.     Medications: Outpatient Medications Prior to Visit  Medication Sig   Adapalene  (DIFFERIN ) 0.3 % gel Apply 1 application topically at bedtime.   Alcohol Swabs (B-D SINGLE USE SWABS REGULAR) PADS USE TO CLEANSE SKIN EVERY DAY BEFORE CHECKING BLOOD SUGAR    amphetamine-dextroamphetamine (ADDERALL) 20 MG tablet Take 20 mg by mouth in the morning, at noon, and at bedtime. Has not started yet 11/10/19   atorvastatin  (LIPITOR) 20 MG tablet TAKE ONE TABLET (20 MG TOTAL) BY MOUTH DAILY   azelastine  (ASTELIN ) 0.1 % nasal spray Place 1 spray into both nostrils 2 (two) times daily. Use in each nostril as directed   carboxymethylcellulose (LUBRICANT EYE DROPS) 0.5 % SOLN Place 1 drop into the left eye in the morning and at bedtime. For two weeks.   Cholecalciferol (VITAMIN D3) 25 MCG (1000 UT) CAPS Take 1 capsule (1,000 Units total) by mouth daily.   DULoxetine  (CYMBALTA ) 60 MG capsule Take 2 capsules (120 mg total) by mouth daily.   etonogestrel  (NEXPLANON ) 68 MG IMPL implant 1 each by Subdermal route once.   ferrous sulfate 325 (65 FE) MG tablet Take by mouth.   fluticasone  (FLONASE ) 50 MCG/ACT nasal spray USE 2 SPRAYS IN EACH NOSTRIL DAILY   folic acid  (FOLVITE ) 800 MCG tablet Take 1 tablet (800 mcg total) by mouth daily.   gabapentin  (NEURONTIN ) 300 MG capsule Take 1 capsule (300 mg total) by mouth at bedtime.   hydrocortisone  (ANUSOL -HC) 2.5 % rectal cream Place 1 Application rectally 2 (two) times daily.   hydroquinone  4 % cream APPLY TO THE AFFECTED AREA ON SKIN EVERY NIGHT AT  BEDTIME   ipratropium (ATROVENT ) 0.03 % nasal spray 1-2 puffs twice daily for 3 days   Lancets Misc. (ACCU-CHEK FASTCLIX LANCET) KIT To check blood sugar once daily   levocetirizine (XYZAL ) 5 MG tablet Take 1 tablet (5 mg total) by mouth every evening.   levothyroxine  (SYNTHROID ) 25 MCG tablet TAKE ONE TABLET (25 MCG TOTAL) BY MOUTH DAILY BEFORE BREAKFAST   lisinopril  (ZESTRIL ) 5 MG tablet TAKE ONE TABLET (5 MG TOTAL) BY MOUTH DAILY   loratadine  (CLARITIN ) 10 MG tablet Take 1 tablet (10 mg total) by mouth daily.   metoCLOPramide (REGLAN) 10 MG tablet Take by mouth.   metoprolol  succinate (TOPROL -XL) 25 MG 24 hr tablet TAKE ONE TABLET (25 MG TOTAL) BY MOUTH DAILY   Multiple  Vitamin tablet Take 1 tablet by mouth daily.   Olopatadine  HCl 0.2 % SOLN Apply 1 drop to eye daily. One drop to L eye twice daily for two weeks.   pregabalin  (LYRICA ) 200 MG capsule Take 1 capsule by mouth once daily   ramelteon  (ROZEREM ) 8 MG tablet TAKE ONE TABLET BY MOUTH EVERY DAY AT BEDTIME   spironolactone  (ALDACTONE ) 25 MG tablet TAKE ONE TABLET (25 MG TOTAL) BY MOUTH DAILY   tiZANidine  (ZANAFLEX ) 2 MG tablet Take 1 tablet (2 mg total) by mouth 3 (three) times daily as needed for muscle spasms.   traZODone  (DESYREL ) 150 MG tablet Take 1 tablet (150 mg total) by mouth at bedtime.   Vitamin D , Ergocalciferol , (DRISDOL ) 1.25 MG (50000 UNIT) CAPS capsule Take 1 capsule (50,000 Units total) by mouth every 7 (seven) days. Take with meal.   VRAYLAR 1.5 MG capsule Take 1.5 mg by mouth daily.   No facility-administered medications prior to visit.    Last CBC Lab Results  Component Value Date   WBC 5.4 09/04/2023   HGB 13.5 09/04/2023   HCT 41.4 09/04/2023   MCV 92 09/04/2023   MCH 30.1 09/04/2023   RDW 12.9 09/04/2023   PLT 213 09/04/2023   Last metabolic panel Lab Results  Component Value Date   GLUCOSE 89 09/04/2023   NA 142 09/04/2023   K 3.9 09/04/2023   CL 109 (H) 09/04/2023   CO2 19 (L) 09/04/2023   BUN 12 09/04/2023   CREATININE 0.97 09/04/2023   EGFR 69 09/04/2023   CALCIUM  8.7 09/04/2023   PROT 5.9 (L) 09/04/2023   ALBUMIN 3.9 09/04/2023   LABGLOB 2.0 09/04/2023   AGRATIO 1.9 01/26/2022   BILITOT 0.8 09/04/2023   ALKPHOS 126 (H) 09/04/2023   AST 20 09/04/2023   ALT 25 09/04/2023   ANIONGAP 7 01/07/2019   Last lipids Lab Results  Component Value Date   CHOL 103 09/04/2023   HDL 39 (L) 09/04/2023   LDLCALC 49 09/04/2023   TRIG 72 09/04/2023   CHOLHDL 2.6 09/04/2023   Last hemoglobin A1c Lab Results  Component Value Date   HGBA1C 5.9 (H) 09/04/2023   Last thyroid  functions Lab Results  Component Value Date   TSH 1.570 09/04/2023   T4TOTAL 4.6  01/24/2021   Last vitamin D  Lab Results  Component Value Date   VD25OH 64.0 05/13/2023   Last vitamin B12 and Folate Lab Results  Component Value Date   VITAMINB12 260 09/04/2023   FOLATE >20.0 09/04/2023        Objective    BP 110/78   Pulse 81   Ht 5' 7 (1.702 m)   Wt 190 lb (86.2 kg)   SpO2 99%   BMI 29.76 kg/m  BP Readings from Last 3 Encounters:  02/10/24 110/78  10/29/23 130/80  09/24/23 108/73   Wt Readings from Last 3 Encounters:  02/10/24 190 lb (86.2 kg)  10/29/23 154 lb 4.8 oz (70 kg)  09/24/23 177 lb (80.3 kg)      Physical Exam   Physical Exam Physical Exam    SKIN: Erythematous macules and papules scattered on chest with mild erythema extending to neck. Area not warm to touch. Signs of excoriation present. Diffuse, nonlinear pattern.   No results found for any visits on 02/10/24.  Assessment & Plan     Assessment & Plan Dermatitis Acute erythematous macules and papules on the face and chest, tender, non-pruritic, and afebrile. Rash onset five days ago post sun exposure without sunscreen. Differential includes sun exposure reaction or contact dermatitis, excluding poison ivy. No new medications or topical products prior to rash onset. Hydroquinone  cream used on the face only. - Prescribe hydrocortisone  2.5% cream for nocturnal application to affected neck and chest areas for up to two weeks. - Discontinue cream if rash resolves within four to five days. - Discuss potential hyperpigmentation and plan for post-rash management if necessary. - Advise against scratching or rupturing lesions to prevent scarring.      No follow-ups on file.        Rockie Agent, MD  [facility] [phone] (phone) [phone] (fax)  [facility]

## 2024-02-12 DIAGNOSIS — I10 Essential (primary) hypertension: Secondary | ICD-10-CM | POA: Diagnosis not present

## 2024-02-12 DIAGNOSIS — F3181 Bipolar II disorder: Secondary | ICD-10-CM | POA: Diagnosis not present

## 2024-02-12 DIAGNOSIS — R413 Other amnesia: Secondary | ICD-10-CM | POA: Diagnosis not present

## 2024-02-13 ENCOUNTER — Telehealth: Payer: Self-pay

## 2024-02-13 NOTE — Telephone Encounter (Signed)
 Copied from CRM [id number]. Topic: Clinical - Medication Question >> Feb 12, 2024  4:00 PM Santiya F wrote: Reason for CRM: Patient is calling in wanting to know if her provider can prescribe her minoxidil or if that's something she needs to get from the dermatologist.

## 2024-02-17 ENCOUNTER — Other Ambulatory Visit: Payer: Self-pay

## 2024-02-17 DIAGNOSIS — L309 Dermatitis, unspecified: Secondary | ICD-10-CM

## 2024-02-26 DIAGNOSIS — R42 Dizziness and giddiness: Secondary | ICD-10-CM | POA: Diagnosis not present

## 2024-02-26 DIAGNOSIS — R5383 Other fatigue: Secondary | ICD-10-CM | POA: Diagnosis not present

## 2024-02-26 DIAGNOSIS — K912 Postsurgical malabsorption, not elsewhere classified: Secondary | ICD-10-CM | POA: Diagnosis not present

## 2024-02-26 DIAGNOSIS — E876 Hypokalemia: Secondary | ICD-10-CM | POA: Diagnosis not present

## 2024-02-26 DIAGNOSIS — E58 Dietary calcium deficiency: Secondary | ICD-10-CM | POA: Diagnosis not present

## 2024-02-26 DIAGNOSIS — R233 Spontaneous ecchymoses: Secondary | ICD-10-CM | POA: Diagnosis not present

## 2024-02-26 DIAGNOSIS — Z9884 Bariatric surgery status: Secondary | ICD-10-CM | POA: Diagnosis not present

## 2024-02-26 DIAGNOSIS — E559 Vitamin D deficiency, unspecified: Secondary | ICD-10-CM | POA: Diagnosis not present

## 2024-02-26 DIAGNOSIS — L811 Chloasma: Secondary | ICD-10-CM | POA: Diagnosis not present

## 2024-02-28 DIAGNOSIS — F411 Generalized anxiety disorder: Secondary | ICD-10-CM | POA: Diagnosis not present

## 2024-02-28 DIAGNOSIS — F4312 Post-traumatic stress disorder, chronic: Secondary | ICD-10-CM | POA: Diagnosis not present

## 2024-02-28 DIAGNOSIS — F3132 Bipolar disorder, current episode depressed, moderate: Secondary | ICD-10-CM | POA: Diagnosis not present

## 2024-02-28 DIAGNOSIS — F902 Attention-deficit hyperactivity disorder, combined type: Secondary | ICD-10-CM | POA: Diagnosis not present

## 2024-03-02 ENCOUNTER — Ambulatory Visit: Admitting: Dermatology

## 2024-03-02 DIAGNOSIS — E58 Dietary calcium deficiency: Secondary | ICD-10-CM | POA: Diagnosis not present

## 2024-03-02 DIAGNOSIS — K909 Intestinal malabsorption, unspecified: Secondary | ICD-10-CM | POA: Diagnosis not present

## 2024-03-02 DIAGNOSIS — Z9884 Bariatric surgery status: Secondary | ICD-10-CM | POA: Diagnosis not present

## 2024-03-02 NOTE — Progress Notes (Signed)
 Subjective   HPI:  Patient is here for visit for follow up and treatment of chronic metabolic issues. She is  2 year status post  SGB performed. Weight loss from time of surgery has been 95 lbs.  Past year's visits were reviewed with nutrition, as well any admissions or ER visits. We discussed her long term weight loss goals and the necessary components of hitting protein target every single day, regular weight training exercise, and taking nutritional supplements and their key roles in achieving sustainable success.  Recent labs were reviewed as below with special attention to chronic abnormalities.   Follow-Up Questions:  Comorbidity List After Surgery: Wn:Ipjazuzd No:Insulin  No:Non-Insulin  Wn:HZMI requiring medicine  Bzd:Ybezmopepizfpj  Bzd:Ybezmuzwdpnw 2 Medications Wn:Dozze Apnea   she denies any problems with:  nausea or vomiting no, melena or bloody stools no, diarrhea no, constipation no, dysphagia no, excessive weight loss no.  Total time spent with patient was 25 minutes, of which 25 minutes were spent counseling regarding above issues.   Please see changes in medications compared to preop.  She is/is not  taking below listed supplements as prescribed in the manual.   PPI- not taking  Citrucel- no  MVI -yes  Actigall- no  Pepto Bismol -no  Calcium  Citrate -yes (600 mg once daily)  Vitamin D  -yes   she  is not doing resistance training. We discussed short and long term importance of regular exercise to maximize the metabolic benefits of the operation and lifestyle changes, as well as to prevent or minimize any muscle wasting. In particular, we talked about at least twice a week performing a typically cardio oriented exercise program, while 3 days a week performing resistance training. We also discussed variations on above suggested schedule. Also gave she information about exercising at home while gyms are closed and / or weather is bad.  she is  Complaining of   initially losing too much weight and now feels that she has put too much weight back on. She would like to lose 10-20 pounds.  She also endorses moderate gas.  denies vomiting, abdominal pain, fussiness, diarrhea, cough, and difficulty breathing Latex allergy  Past Medical History:  Diagnosis Date  . Anemia   . Anxiety   . Chronic kidney disease   . Depression   . Diabetes mellitus (*)   . Hyperlipidemia    Past Surgical History:  Procedure Laterality Date  . Cholecystectomy     Medications Ordered Prior to Encounter[1] Allergies: Allergies[2] Social History[3] History reviewed. No pertinent family history. ROS: Vitamin Deficiencies denies evidence of calcium  deficiency such as altered mental status, tetanus, generalized weakness denies evidence of L- Carnitine deficiency such as lipid intolerance, encephalopathy denies evidence of cobalamin deficiency such as general weakness, anemia denies evidence of copper deficiency including fatigue, bleeding under the skin, anemia, cardiomegaly denies evidence of folate deficiency including generalized weakness, anemia GI discomfort denies evidence of iron deficiency including fatigue shortness of breath malaise denies evidence of thiamine  deficiency such as numbness in the fingers or toes, neuropathy, irritation, encephalopathy denies evidence of vitamin A deficiency such as night blindness denies evidence of vitamin D  deficiency such as bone or joint pain, depression denies evidence of zinc deficiency such as skin disorders or hair loss  Objective  BP 123/80 (BP Location: Left Upper Arm, Patient Position: Sitting)   Pulse 84   Ht 5' 7 (1.702 m)   Wt 197 lb 6.4 oz (89.5 kg)   SpO2 100%   BMI 30.92 kg/m  Chaperone  is not present for exam Physical Examination:  GENERAL ASSESSMENT: well developed and well nourished, obese SKIN: normal color, no lesions, non-icteric, no petechiae, no ecchymoses HEAD: normocephalic,  atraumatic EYES: non-icteric sclerae EARS: normal external appearance NOSE: normal external appearance and nares patent MOUTH:  moist mucosa, no dry mucosa, no tongue coating, multiple teeth present and grossly normal NECK: normal supple full range of motion and no adenopathy or masses, trachea midline, no carotid bruits, no JVD CHEST: normal air exchange, no rales, no rhonchi, no wheezes, respiratory effort normal with no retractions HEART: regular rate and rhythm, normal S1/S2, no murmurs, no thrills, no rubs, no gallops ABDOMEN: soft, obese, non-distended, no masses, no hepatosplenomegaly, incisions are well healed with no herniation EXTREMITY: normal and symmetric movement, normal range of motion, no joint swelling NEURO: strength normal and symmetric, alert and oriented X 3, gait normal Psycho/social: normal affect and behavior  Orders Only on 02/25/2024  Component Date Value Ref Range Status  . Vitamin B1 02/26/2024 112.4  66.5 - 200.0 nmol/L Final  . Vit D, 25-Hydroxy 02/26/2024 36.3  30.0 - 100.0 ng/mL Final  . Prealbumin 02/26/2024 24  10 - 36 mg/dL Final  . Ferritin 92/69/7974 64  15 - 150 ng/mL Final  . Glucose 02/26/2024 85  70 - 99 mg/dL Final  . BUN 92/69/7974 13  6 - 24 mg/dL Final  . Creatinine 92/69/7974 1.02 (H)  0.57 - 1.00 mg/dL Final  . eGFR 92/69/7974 65  >59 mL/min/1.73 Final  . Interpretation 02/26/2024 Comment   Final  . BUN/Creatinine Ratio 02/26/2024 13  9 - 23 Final  . Sodium 02/26/2024 140  134 - 144 mmol/L Final  . Potassium 02/26/2024 4.6  3.5 - 5.2 mmol/L Final  . Chloride 02/26/2024 105  96 - 106 mmol/L Final  . CO2 02/26/2024 20  20 - 29 mmol/L Final  . CALCIUM  02/26/2024 8.1 (L)  8.7 - 10.2 mg/dL Final  . Total Protein 02/26/2024 6.3  6.0 - 8.5 g/dL Final  . Albumin, Serum 02/26/2024 4.1  3.8 - 4.9 g/dL Final  . Globulin, Total 02/26/2024 2.2  1.5 - 4.5 g/dL Final  . Total Bilirubin 02/26/2024 0.4  0.0 - 1.2 mg/dL Final  . Alkaline Phosphatase  02/26/2024 155 (H)  44 - 121 IU/L Final  . AST 02/26/2024 50 (H)  0 - 40 IU/L Final  . ALT (SGPT) 02/26/2024 74 (H)  0 - 32 IU/L Final  . Cholesterol, Total 02/26/2024 120  100 - 199 mg/dL Final  . Triglycerides 02/26/2024 58  0 - 149 mg/dL Final  . HDL 92/69/7974 49  >39 mg/dL Final  . VLDL Cholesterol Cal 02/26/2024 13  5 - 40 mg/dL Final  . LDL 92/69/7974 58  0 - 99 mg/dL Final  . Hemoglobin J8r 02/26/2024 5.5  4.8 - 5.6 % Final  . Vitamin B-12 02/26/2024 1,014  232 - 1,245 pg/mL Final  . Folate 02/26/2024 >20.0  >3.0 ng/mL Final  . WBC 02/26/2024 5.1  3.4 - 10.8 x10E3/uL Final  . RBC 02/26/2024 4.32  3.77 - 5.28 x10E6/uL Final  . Hemoglobin 02/26/2024 13.1  11.1 - 15.9 g/dL Final  . Hematocrit 92/69/7974 40.8  34.0 - 46.6 % Final  . MCV 02/26/2024 94  79 - 97 fL Final  . MCH 02/26/2024 30.3  26.6 - 33.0 pg Final  . MCHC 02/26/2024 32.1  31.5 - 35.7 g/dL Final  . RDW 92/69/7974 12.3  11.7 - 15.4 % Final  . Platelet Count 02/26/2024  247  150 - 450 x10E3/uL Final  . Neutrophils 02/26/2024 51  Not Estab. % Final  . Lymphs Relative 02/26/2024 38  Not Estab. % Final  . Monocytes 02/26/2024 7  Not Estab. % Final  . Eos Relative 02/26/2024 3  Not Estab. % Final  . Basos Relative  02/26/2024 1  Not Estab. % Final  . Neutrophils Absolute 02/26/2024 2.6  1.4 - 7.0 x10E3/uL Final  . Lymphocytes Absolute 02/26/2024 1.9  0.7 - 3.1 x10E3/uL Final  . Monocytes Absolute 02/26/2024 0.4  0.1 - 0.9 x10E3/uL Final  . Eosinophils Absolute 02/26/2024 0.2  0.0 - 0.4 x10E3/uL Final  . Basophils Absolute 02/26/2024 0.1  0.0 - 0.2 x10E3/uL Final  . Immature Granulocytes 02/26/2024 0  Not Estab. % Final  . Immature Grans (Abs) 02/26/2024 0.0  0.0 - 0.1 x10E3/uL Final   Assessment   1. History of gastric bypass   2. Intestinal malabsorption, unspecified type (*)   3. Calcium  deficiency   sment Plan  Barbara Anderson has done well post bariatric surgery. she is status post laparoscopic sleeve gastric  bypass at [facility]. she has been compliant with our bariatric post operative requirements.  Current BMI is Body mass index is 30.92 kg/m..  she has had resolution or, at least, improvement of her obesity related co-morbidities.  Labs reviewed today and show calcium  8.1; alkaline phosphatase 151, AST 50, ALT 74  Reminded calcium  citrate should be 600 mg twice daily.  she has obesity-related medical problems including:  1. History of gastric bypass   2. Intestinal malabsorption, unspecified type (*)   3. Calcium  deficiency    .  Exercise, including three times per week resistance training stressed along with at least twice per week of cardio related exercise. We discussed in detail the differences and advantages of each..  Protein intake of 80+ gms per day stressed. Offered if patient had any concerns or confusion that we arrange follow up appointment with nutritionist.  Reiterated the importance of maintaining a high-protein, low-fat, low carbohydrate diet in order to promote weight loss.  She endorses feeling underweight and has worked to regain some weight however she feels as though she has gained a little too much back.  Recommended getting back on track with post bariatric surgery dietary recommendations. Reviewed long-term dietary recommendations and counseled:  Read nutrition labels on foods! Stay away from high sugar drinks and foods.   Avoid foods with more than 8 grams of sugar per serving.  A good practice is to clean your kitchen of these refined sweets and high carbohydrate foods.  This will take away the temptation to eat these foods when your willpower is weak.  Cut back on sugary drinks. Trade for water, gatoraid zero, propel, powerade zero, water infused with fruit, or Crystal Light. Do not drink your calories, but save calories for good, nutritious foods. Don't skip meals!  Don't be tempted to skip meals to 'save' on calories, this generally just results  in increased hunger/larger portions/snacking later on.  Can use one protein shake a day - this can be in place of a meal Increase consumption of non-starchy vegetables (tomatoes, lettuce, cucumbers, broccoli, cauliflower, zucchini, squash, green beans, etc.).  Make sure to get good protein intake.   Protein is essential to build and retain muscle and also creates good satiety (makes you feel full).   Aim for at least 80+ grams of protein daily.   Try to stick with lean proteins - such as chicken,  [place], lean steaks (avoid cuts with a lot of marbling), lean hamburger (93% lean or greater), lean cuts of pork (like tenderloin), fish, legumes, nuts and seeds.  Stick with less than 100-125 grams of carbohydrates each day.  Limit bread, rice, pasta, corn and potatoes.  Make snacks high in protein and low in carbs.  Examples of high protein snacks are low-fat string cheese, deli meat, eggs, a handful of nuts, low sugar greek yogurt, etc  Discussed the importance of exercise both promoting as well as maintaining weight loss.  Encouraged to begin developing a consistent exercise routine Encouraged to set small attainable exercise goals and then add exercise minutes in days and as you are able to tolerate Exercise goals are: You should be doing some type of resistance training 3 times weekly at least (including kettle bells/bands/free weights). In addition, you should be doing a cardiovascular workout such as walking/running/elliptical for 30 min at least 2-3 times weekly.  Remember that building muscle burns fat. There are several examples of exercises you can do from home on web sites such as youtube or apps like fit-on This is critically important for maintaining long term the positive changes.  Follow up in 1 year with labs.  Lifestyle and supplementation per AVS.   I have reviewed the discharge plans and course since discharge. After discussion, the patient understands these plans and questions  related to the plans have been addressed       [1] Current Outpatient Medications on File Prior to Visit  Medication Sig Dispense Refill  . Adapalene  0.3 % gel Apply topically.    . ALPRAZolam (XANAX) 1 mg tablet Take one tablet (1 mg dose) by mouth 3 (three) times a day as needed.    SABRA amitriptyline HCl (ELAVIL) 75 mg tablet Take one tablet (75 mg dose) by mouth at bedtime.    SABRA amphetamine-dextroamphetamine (ADDERALL) 20 MG tablet Take one tablet by mouth 3 (three) times a day.    . atorvastatin  (LIPITOR) 20 mg tablet Take one tablet (20 mg dose) by mouth daily.    . AZELASTINE  137 mcg/spray nasal spray two sprays by Nasal route 2 (two) times daily.    . cariprazine (VRAYLAR) 6 mg capsule Take one capsule (6 mg dose) by mouth daily.    . cholecalciferol 25 MCG (1000 UT) capsule Take one capsule (1,000 Units dose) by mouth daily. (Patient not taking: Reported on 03/02/2024)    . DULoxetine  HCl (CYMBALTA ) 60 mg capsule Take two capsules (120 mg dose) by mouth daily.    . ergocalciferol  (VITAMIN D2) 50,000 units CAPS capsule Take one capsule (50,000 Units dose) by mouth.    . etonogestrel  (NEXPLANON )  implant Inject one each into the skin once.    . ferrous sulfate 325 (65 FE) MG tablet Take by mouth.    . fluticasone  propionate (FLONASE ) 50 mcg/actuation nasal spray USE 2 SPRAYS IN BOTH  NOSTRILS DAILY (Patient not taking: Reported on 03/02/2024)    . folic acid  (FOLVITE ) 800 MCG tablet Take one tablet (800 mcg dose) by mouth daily.    . gabapentin  (NEURONTIN ) 300 mg capsule Take one capsule (300 mg dose) by mouth 3 (three) times a day.    . hydrocortisone  (ANUSOL -HC,PROCTOSOL,PROCTOZONE ,PROCTO-MED) 2.5% rectal cream Place one Application rectally 2 (two) times daily.    . hydroquinone  (ELDOQUIN FORTE) 4 % cream SMARTSIG:Topical Every Night    . levocetirizine (XYZAL ) 5 mg tablet Take one tablet (5 mg dose) by mouth every evening.    SABRA  levothyroxine  sodium (SYNTHROID ,LOVOTHROID,LEVOXYL ) 25  mcg tablet TAKE 1 TABLET BY MOUTH  DAILY BEFORE BREAKFAST    . lisinopril  (PRINIVIL ,ZESTRIL ) 5 mg tablet Take one tablet (5 mg dose) by mouth daily. (Patient taking differently: Take one half tablet (2.5 mg dose) by mouth daily.)    . loratadine  (CLARITIN ) 10 MG tablet Take one tablet (10 mg dose) by mouth daily.    . metoclopramide HCl (REGLAN) 10 mg tablet Take one tablet (10 mg dose) by mouth once. (Patient not taking: Reported on 03/02/2024)    . metoprolol  succinate (TOPROL -XL) 25 mg 24 hr tablet Take one tablet (25 mg dose) by mouth daily. (Patient taking differently: Take one tablet (25 mg dose) by mouth daily. Per the pt she's taking a 1/2 tablet.)    . Multiple Vitamin (MULTIVITAMIN) tablet Take one tablet by mouth daily.    . pregabalin  (LYRICA ) 200 MG capsule Take one capsule (200 mg dose) by mouth 2 (two) times daily.    . ramelteon  (ROZEREM ) 8 MG tablet Take one tablet (8 mg dose) by mouth at bedtime.    . spironolactone  (ALDACTONE ) 25 mg tablet Take one tablet (25 mg dose) by mouth daily.    . tiZANidine  (ZANAFLEX ) 4 mg capsule Take one capsule (4 mg dose) by mouth.    . topiramate  (TOPAMAX ) 200 MG tablet Take one tablet (200 mg dose) by mouth.    . traZODone  (DESYREL ) 150 MG tablet Take one tablet (150 mg dose) by mouth at bedtime.     No current facility-administered medications on file prior to visit.  [2] Allergies Allergen Reactions  . Metformin  And Related Diarrhea  [3] Social History Socioeconomic History  . Marital status: Divorced  Tobacco Use  . Smoking status: Never    Passive exposure: Never  . Smokeless tobacco: Never  Vaping Use  . Vaping status: Never Used  Substance and Sexual Activity  . Alcohol use: Never  . Drug use: Never

## 2024-03-03 ENCOUNTER — Encounter: Payer: Self-pay | Admitting: Physician Assistant

## 2024-03-03 ENCOUNTER — Ambulatory Visit: Admitting: Physician Assistant

## 2024-03-03 VITALS — BP 127/80 | HR 67 | Resp 16 | Ht 67.0 in | Wt 196.3 lb

## 2024-03-03 DIAGNOSIS — R748 Abnormal levels of other serum enzymes: Secondary | ICD-10-CM | POA: Diagnosis not present

## 2024-03-03 DIAGNOSIS — F4001 Agoraphobia with panic disorder: Secondary | ICD-10-CM | POA: Diagnosis not present

## 2024-03-03 DIAGNOSIS — E669 Obesity, unspecified: Secondary | ICD-10-CM | POA: Diagnosis not present

## 2024-03-03 DIAGNOSIS — E034 Atrophy of thyroid (acquired): Secondary | ICD-10-CM

## 2024-03-03 DIAGNOSIS — E1159 Type 2 diabetes mellitus with other circulatory complications: Secondary | ICD-10-CM | POA: Diagnosis not present

## 2024-03-03 DIAGNOSIS — F422 Mixed obsessional thoughts and acts: Secondary | ICD-10-CM

## 2024-03-03 DIAGNOSIS — E1169 Type 2 diabetes mellitus with other specified complication: Secondary | ICD-10-CM | POA: Diagnosis not present

## 2024-03-03 DIAGNOSIS — I152 Hypertension secondary to endocrine disorders: Secondary | ICD-10-CM

## 2024-03-03 DIAGNOSIS — E785 Hyperlipidemia, unspecified: Secondary | ICD-10-CM

## 2024-03-03 DIAGNOSIS — F319 Bipolar disorder, unspecified: Secondary | ICD-10-CM

## 2024-03-03 DIAGNOSIS — G894 Chronic pain syndrome: Secondary | ICD-10-CM

## 2024-03-03 DIAGNOSIS — E1129 Type 2 diabetes mellitus with other diabetic kidney complication: Secondary | ICD-10-CM

## 2024-03-03 NOTE — Progress Notes (Signed)
 Established patient visit  Patient: Barbara Anderson   DOB: 1971-05-24   53 y.o. Female  MRN: [id number] Visit Date: 03/03/2024  Today's healthcare provider: Jolynn Spencer, PA-C   Chief Complaint  Patient presents with   Follow-up    Chronic f/u   Hyperlipidemia   Hypertension   Hypothyroidism   Subjective      Discussed the use of AI scribe software for clinical note transcription with the patient, who gave verbal consent to proceed.  History of Present Illness Barbara Anderson is a 53 year old female with hypertension, hyperlipidemia, and chronic pain who presents for follow-up care.  She manages hypertension with lisinopril  2.5 mcg and metoprolol  25 mg, maintaining home blood pressure readings around 125/80 mmHg. Hyperlipidemia is treated with a cholesterol medication at 20 mg daily. She is on levothyroxine  for thyroid  management but is unsure of the dosage. Recent lab work from a bariatric clinic was reportedly normal.  For panic disorder, PTSD, and bipolar disorder, she takes Adderall, Vraylar, and Cymbalta , and is satisfied with her behavioral health provider. Chronic pain persists, managed with pregabalin  and Cymbalta , and she receives injections at a pain clinic in [facility].  She underwent bariatric surgery at [facility] in [place] and follows up annually. She experienced a recent weight gain after losing too much weight and is currently comfortable with her weight. No leg swelling is present, and her legs are in good condition.       03/03/2024    1:54 PM 02/10/2024   10:56 AM 10/29/2023    1:24 PM  Depression screen PHQ 2/9  Decreased Interest 0 0 0  Down, Depressed, Hopeless 0 0 0  PHQ - 2 Score 0 0 0  Altered sleeping 2 2 2   Tired, decreased energy 0 1 0  Change in appetite 1 1 0  Feeling bad or failure about yourself  0 0 0  Trouble concentrating 0 0 0  Moving slowly or fidgety/restless 0 0 0  Suicidal thoughts 0 0 0  PHQ-9 Score 3 4 2   Difficult doing  work/chores Somewhat difficult Somewhat difficult Not difficult at all      03/03/2024    1:54 PM 02/10/2024   10:56 AM 10/29/2023    1:24 PM 09/17/2023   11:22 AM  GAD 7 : Generalized Anxiety Score  Nervous, Anxious, on Edge 0 0 0 2  Control/stop worrying 0 0 0 2  Worry too much - different things 0 0 0 2  Trouble relaxing 0  0 2  Restless 0  0 2  Easily annoyed or irritable 0  0 0  Afraid - awful might happen 0  0 2  Total GAD 7 Score 0  0 12  Anxiety Difficulty   Not difficult at all Extremely difficult    Medications: Outpatient Medications Prior to Visit  Medication Sig   Adapalene  (DIFFERIN ) 0.3 % gel Apply 1 application topically at bedtime.   Alcohol Swabs (B-D SINGLE USE SWABS REGULAR) PADS USE TO CLEANSE SKIN EVERY DAY BEFORE CHECKING BLOOD SUGAR   amphetamine-dextroamphetamine (ADDERALL) 20 MG tablet Take 20 mg by mouth in the morning, at noon, and at bedtime. Has not started yet 11/10/19   atorvastatin  (LIPITOR) 20 MG tablet TAKE ONE TABLET (20 MG TOTAL) BY MOUTH DAILY   azelastine  (ASTELIN ) 0.1 % nasal spray Place 1 spray into both nostrils 2 (two) times daily. Use in each nostril as directed   carboxymethylcellulose (LUBRICANT EYE DROPS) 0.5 % SOLN Place 1 drop into  the left eye in the morning and at bedtime. For two weeks.   Cholecalciferol (VITAMIN D3) 25 MCG (1000 UT) CAPS Take 1 capsule (1,000 Units total) by mouth daily.   DULoxetine  (CYMBALTA ) 60 MG capsule Take 2 capsules (120 mg total) by mouth daily.   etonogestrel  (NEXPLANON ) 68 MG IMPL implant 1 each by Subdermal route once.   ferrous sulfate 325 (65 FE) MG tablet Take by mouth.   fluticasone  (FLONASE ) 50 MCG/ACT nasal spray USE 2 SPRAYS IN EACH NOSTRIL DAILY   folic acid  (FOLVITE ) 800 MCG tablet Take 1 tablet (800 mcg total) by mouth daily.   gabapentin  (NEURONTIN ) 300 MG capsule Take 1 capsule (300 mg total) by mouth at bedtime.   hydrocortisone  (ANUSOL -HC) 2.5 % rectal cream Place 1 Application rectally 2  (two) times daily.   hydrocortisone  2.5 % ointment Apply topically 2 (two) times daily. Do not use for more than 1-2 weeks at a time.   hydroquinone  4 % cream APPLY TO THE AFFECTED AREA ON SKIN EVERY NIGHT AT BEDTIME   ipratropium (ATROVENT ) 0.03 % nasal spray 1-2 puffs twice daily for 3 days   Lancets Misc. (ACCU-CHEK FASTCLIX LANCET) KIT To check blood sugar once daily   levocetirizine (XYZAL ) 5 MG tablet Take 1 tablet (5 mg total) by mouth every evening.   levothyroxine  (SYNTHROID ) 25 MCG tablet TAKE ONE TABLET (25 MCG TOTAL) BY MOUTH DAILY BEFORE BREAKFAST   lisinopril  (ZESTRIL ) 5 MG tablet TAKE ONE TABLET (5 MG TOTAL) BY MOUTH DAILY   loratadine  (CLARITIN ) 10 MG tablet Take 1 tablet (10 mg total) by mouth daily.   metoCLOPramide (REGLAN) 10 MG tablet Take by mouth.   metoprolol  succinate (TOPROL -XL) 25 MG 24 hr tablet TAKE ONE TABLET (25 MG TOTAL) BY MOUTH DAILY   Multiple Vitamin tablet Take 1 tablet by mouth daily.   Olopatadine  HCl 0.2 % SOLN Apply 1 drop to eye daily. One drop to L eye twice daily for two weeks.   pregabalin  (LYRICA ) 200 MG capsule Take 1 capsule by mouth once daily   ramelteon  (ROZEREM ) 8 MG tablet TAKE ONE TABLET BY MOUTH EVERY DAY AT BEDTIME   spironolactone  (ALDACTONE ) 25 MG tablet TAKE ONE TABLET (25 MG TOTAL) BY MOUTH DAILY   tiZANidine  (ZANAFLEX ) 2 MG tablet Take 1 tablet (2 mg total) by mouth 3 (three) times daily as needed for muscle spasms.   Vitamin D , Ergocalciferol , (DRISDOL ) 1.25 MG (50000 UNIT) CAPS capsule Take 1 capsule (50,000 Units total) by mouth every 7 (seven) days. Take with meal.   VRAYLAR 1.5 MG capsule Take 1.5 mg by mouth daily.   [DISCONTINUED] traZODone  (DESYREL ) 150 MG tablet Take 1 tablet (150 mg total) by mouth at bedtime.   No facility-administered medications prior to visit.    Review of Systems All negative Except see HPI       Objective    BP 127/80 (BP Location: Left Arm, Patient Position: Sitting, Cuff Size: Large)    Pulse 67   Resp 16   Ht 5' 7 (1.702 m)   Wt 196 lb 4.8 oz (89 kg)   SpO2 100%   BMI 30.74 kg/m     Physical Exam Vitals reviewed.  Constitutional:      General: She is not in acute distress.    Appearance: Normal appearance. She is well-developed. She is not diaphoretic.  HENT:     Head: Normocephalic and atraumatic.  Eyes:     General: No scleral icterus.    Conjunctiva/sclera: Conjunctivae  normal.  Neck:     Thyroid : No thyromegaly.  Cardiovascular:     Rate and Rhythm: Normal rate and regular rhythm.     Pulses: Normal pulses.     Heart sounds: Normal heart sounds. No murmur heard. Pulmonary:     Effort: Pulmonary effort is normal. No respiratory distress.     Breath sounds: Normal breath sounds. No wheezing, rhonchi or rales.  Musculoskeletal:     Cervical back: Neck supple.     Right lower leg: No edema.     Left lower leg: No edema.  Lymphadenopathy:     Cervical: No cervical adenopathy.  Skin:    General: Skin is warm and dry.     Findings: No rash.  Neurological:     Mental Status: She is alert and oriented to person, place, and time. Mental status is at baseline.  Psychiatric:        Mood and Affect: Mood normal.        Behavior: Behavior normal.      No results found for any visits on 03/03/24.      Assessment & Plan Essential hypertension Chronic Blood pressure well-controlled with home readings ~125/80 mmHg. - Continue lisinopril  2.5 mg daily. - Continue metoprolol  25 mg daily. Continue low salt diet and regular exercise Will recheck CMP Will follow-up  Hyperlipidemia Chronic Managed with medication. - Continue current cholesterol medication/atorvastatin  20 mg daily. Continue low cholesterol diet and daily exercise Will recheck LP Will follow-up  Hypothyroidism Chronic Taking levothyroxine  25mcg daily. Blood work needed for management. - Check blood work to assess thyroid  function and adjust levothyroxine  dosage if necessary. Will  follow-up  Chronic pain Chronic pain persists. Managed with pregabalin , Cymbalta , and Vraylar. Concerns about polypharmacy and interactions. - Communicate with clinic pharmacist to develop a plan for tapering pregabalin . - Consider alternative medications for pain management in consultation with the pharmacist. - Coordinate with the pain clinic for further management and potential injections. Will follow-up  Bipolar 1 disorder (HCC) (Primary) Mixed obsessional thoughts and acts Panic disorder with agoraphobia Managed with Adderall, Vraylar, and Cymbalta . Satisfied with behavioral health provider.  Obesity, status post bariatric surgery Weight stable after significant weight loss. - Continue annual follow-up with bariatric clinic. - Monitor weight and maintain current weight if feeling well. Will follow-up  Labs were recently done at Bariatric surgery clinic. Advised pt to send us  her lab results for assessment. Pt needs updates and follow-up with extra tests if needed. Will follow-up  DMII Chronic and improved Last A1c 5.9 from 09/04/23 On lifestyle modifications Recheck A1C Will follow-up  Chronic pain syndrome - AMB Referral VBCI Care Management  Orders Placed This Encounter  Procedures   AMB Referral VBCI Care Management    Referral Priority:   Routine    Referral Type:   Consultation    Referral Reason:   Care Coordination    Number of Visits Requested:   1    Return in about 3 months (around 06/03/2024) for chronic disease f/u.   The patient was advised to call back or seek an in-person evaluation if the symptoms worsen or if the condition fails to improve as anticipated.  I discussed the assessment and treatment plan with the patient. The patient was provided an opportunity to ask questions and all were answered. The patient agreed with the plan and demonstrated an understanding of the instructions.  I, Amadou Katzenstein, PA-C have reviewed all documentation for this  visit. The documentation on 03/03/2024  for  the exam, diagnosis, procedures, and orders are all accurate and complete.  Jolynn Spencer, [facility], MMS [facility] [phone] (phone) [phone] (fax)  [facility]

## 2024-03-04 ENCOUNTER — Telehealth: Payer: Self-pay | Admitting: *Deleted

## 2024-03-04 NOTE — Progress Notes (Unsigned)
 Care Guide Pharmacy Note  03/04/2024 Name: Barbara Anderson MRN: [id number] DOB: 1970/09/04  Referred By: Ostwalt, Janna, PA-C Reason for referral: Call Attempt #1 and Complex Care Management (Outreach to schedule referral with pharmacist )   Barbara Anderson is a 53 y.o. year old female who is a primary care patient of Ostwalt, Janna, PA-C.  Barbara Anderson was referred to the pharmacist for assistance related to: chronic pain   An unsuccessful telephone outreach was attempted today to contact the patient who was referred to the pharmacy team for assistance with medication management. Additional attempts will be made to contact the patient.  Barbara Anderson, CMA [place]  [facility], [facility] Guide Direct Dial: [phone]  Fax: [phone] Website: [email or web address]

## 2024-03-05 ENCOUNTER — Ambulatory Visit: Admitting: Dermatology

## 2024-03-05 ENCOUNTER — Encounter: Payer: Self-pay | Admitting: Dermatology

## 2024-03-05 DIAGNOSIS — L6612 Frontal fibrosing alopecia: Secondary | ICD-10-CM

## 2024-03-05 DIAGNOSIS — L811 Chloasma: Secondary | ICD-10-CM | POA: Diagnosis not present

## 2024-03-05 MED ORDER — DUTASTERIDE 0.5 MG PO CAPS: 0.5000 mg | ORAL_CAPSULE | Freq: Every day | ORAL | 1 refills | Status: DC

## 2024-03-05 MED ORDER — PIMECROLIMUS 1 % EX CREA: TOPICAL_CREAM | CUTANEOUS | 5 refills | Status: AC

## 2024-03-05 NOTE — Progress Notes (Deleted)
   Follow-Up Visit   Subjective  Barbara Anderson is a 52 y.o. female who presents for the following: ***   The following portions of the chart were reviewed this encounter and updated as appropriate: medications, allergies, medical history  Review of Systems:  No other skin or systemic complaints except as noted in HPI or Assessment and Plan.  Objective  Well appearing patient in no apparent distress; mood and affect are within normal limits.   A focused examination was performed of the following areas: the scalp and face   Relevant exam findings are noted in the Assessment and Plan.    Assessment & Plan       No follow-ups on file.  Barbara Anderson, CMA, am acting as scribe for Boneta Sharps, MD .  Documentation: I have reviewed the above documentation for accuracy and completeness, and I agree with the above.  Boneta Sharps, MD

## 2024-03-05 NOTE — Progress Notes (Signed)
 New Patient Visit   Subjective  Barbara Anderson is a 53 y.o. female who presents for the following: Worsening hair loss, pt has had issues for many years, but recently started losing more hair. Pt states no recent surgeries, illness, or stress. No itching, burning, stinging of the scalp. Pt c/o dark spots on the face for which she was prescribed hydroquinone  4% cream. She has been using it for 6 mths, but hasn't noticed any improvement. Patient states that dark patches aren't symptomatic as far as being itchy or irritating.   The following portions of the chart were reviewed this encounter and updated as appropriate: medications, allergies, medical history  Review of Systems:  No other skin or systemic complaints except as noted in HPI or Assessment and Plan.  Objective  Well appearing patient in no apparent distress; mood and affect are within normal limits.   A focused examination was performed of the following areas: the face and scalp   Relevant exam findings are noted in the Assessment and Plan.    Assessment & Plan   MELASMA               Exam: reticulated hyperpigmented patches at face  Chronic and persistent condition with duration or expected duration over one year. Condition is symptomatic/ bothersome to patient. Not currently at goal.  Melasma is a chronic; persistent condition of hyperpigmented patches generally on the face, worse in summer due to higher UV exposure.    Heredity; thyroid  disease; sun exposure; pregnancy; birth control pills; epilepsy medication and darker skin may predispose to Melasma.   Recommendations include: - Sun avoidance and daily broad spectrum (UVA/UVB) tinted mineral sunscreen SPF 30+, with Zinc or Titanium Dioxide. - Rx topical bleaching creams (i.e. hydroquinone ) is a common treatment but should not be used long term.  Hydroquinones may be mixed with retinoids; vitamin C; steroids; Kojic Acid. - Alastin A-luminate, retinoids,  vitamin C, topical tranexamic acid, glycolic acid and kojic acid can be used for brightening while on break from hydroquinone  - Rx Azelaic Acid is also a treatment option that is safe for pregnancy (Category B).   Treatment Plan: D/C Hydroquinone  4% since she has been using for 6 mths. After 3 mths patient prefers to increase strength to 12% will order from [facility].  Recommend OTC tranexamic acid daily for the next 3 mths.   Frontal Fibrosing Alopecia               Exam: regression of frontal hair line. Diffuse reduction in hair density on scalp. Reduction in eyebrow density. Scarred appearance of scalp  Plan: Discussed condition and treatment options with patient. Advised that hair loss is permanent and scarring, but medications can help slow progression.   Start OTC Minoxidil 5% apply to aa's scalp QD. Discussed with patient that it typically takes 6-8 mths to see improvement, and if she stops medication hair loss will return to how it was before starting treatment. Discussed oral minoxidil, but patient defers due to hair growth in other places other than the scalp.  Start dutasteride  0.5 mg po QD. Not studied as much in women but potential risk of sexual dysfunction  Start Elidel  cream to aa's BID. May req prior quth  Per patient request hand written prescription given to patient for wig.  FRONTAL FIBROSING ALOPECIA   MELASMA    Return in about 6 months (around 09/05/2024) for FFA and melasma follow up.  LILLETTE Barbara Anderson, CMA, am acting as scribe for  Boneta Sharps, MD .   Documentation: I have reviewed the above documentation for accuracy and completeness, and I agree with the above.  Boneta Sharps, MD

## 2024-03-05 NOTE — Patient Instructions (Addendum)
 Recommend OTC Inkey Tranexamic Acid cream to apply to hyperpigmented areas on face at night.  Can buy at [employer], [employer], or online for around $15.  Recommend minoxidil 5% solution or foam over the counter once daily to the scalp.   Due to recent changes in healthcare laws, you may see results of your pathology and/or laboratory studies on MyChart before the doctors have had a chance to review them. We understand that in some cases there may be results that are confusing or concerning to you. Please understand that not all results are received at the same time and often the doctors may need to interpret multiple results in order to provide you with the best plan of care or course of treatment. Therefore, we ask that you please give us  2 business days to thoroughly review all your results before contacting the office for clarification. Should we see a critical lab result, you will be contacted sooner.   If You Need Anything After Your Visit  If you have any questions or concerns for your doctor, please call our main line at [phone] and press option 4 to reach your doctor's medical assistant. If no one answers, please leave a voicemail as directed and we will return your call as soon as possible. Messages left after 4 pm will be answered the following business day.   You may also send us  a message via MyChart. We typically respond to MyChart messages within 1-2 business days.  For prescription refills, please ask your pharmacy to contact our office. Our fax number is [phone].  If you have an urgent issue when the clinic is closed that cannot wait until the next business day, you can page your doctor at the number below.    Please note that while we do our best to be available for urgent issues outside of office hours, we are not available 24/7.   If you have an urgent issue and are unable to reach us , you may choose to seek medical care at your doctor's office, retail clinic, urgent care  center, or emergency room.  If you have a medical emergency, please immediately call 911 or go to the emergency department.  Pager Numbers  - Dr. Hester: [phone]  - Dr. Jackquline: [phone]  - Dr. Claudene: [phone]   In the event of inclement weather, please call our main line at [phone] for an update on the status of any delays or closures.  Dermatology Medication Tips: Please keep the boxes that topical medications come in in order to help keep track of the instructions about where and how to use these. Pharmacies typically print the medication instructions only on the boxes and not directly on the medication tubes.   If your medication is too expensive, please contact our office at [phone] option 4 or send us  a message through MyChart.   We are unable to tell what your co-pay for medications will be in advance as this is different depending on your insurance coverage. However, we may be able to find a substitute medication at lower cost or fill out paperwork to get insurance to cover a needed medication.   If a prior authorization is required to get your medication covered by your insurance company, please allow us  1-2 business days to complete this process.  Drug prices often vary depending on where the prescription is filled and some pharmacies may offer cheaper prices.  The website www.goodrx.com contains coupons for medications through different pharmacies. The prices here do not account  for what the cost may be with help from insurance (it may be cheaper with your insurance), but the website can give you the price if you did not use any insurance.  - You can print the associated coupon and take it with your prescription to the pharmacy.  - You may also stop by our office during regular business hours and pick up a GoodRx coupon card.  - If you need your prescription sent electronically to a different pharmacy, notify our office through [facility] or by  phone at [phone] option 4.     Si Usted Necesita Algo Despus de Su Visita  Tambin puede enviarnos un mensaje a travs de Clinical cytogeneticist. Por lo general respondemos a los mensajes de MyChart en el transcurso de 1 a 2 das hbiles.  Para renovar recetas, por favor pida a su farmacia que se ponga en contacto con nuestra oficina. Randi lakes de fax es [place] [phone].  Si tiene un asunto urgente cuando la clnica est cerrada y que no puede esperar hasta el siguiente da hbil, puede llamar/localizar a su doctor(a) al nmero que aparece a continuacin.   Por favor, tenga en cuenta que aunque hacemos todo lo posible para estar disponibles para asuntos urgentes fuera del horario de [place], no estamos disponibles las 24 horas del da, los 7 [address]  [address] de la [place].   Si tiene un problema urgente y no puede comunicarse con nosotros, puede optar por buscar atencin mdica  en el consultorio de su doctor(a), en una clnica privada, en un centro de atencin urgente o en una sala de emergencias.  Si tiene Engineer, drilling, por favor llame inmediatamente al 911 o vaya a la sala de emergencias.  Nmeros de bper  - Dr. Hester: [phone]  - Dra. Jackquline: [phone]  - Dr. Claudene: [phone]   En caso de inclemencias del tiempo, por favor llame a landry capes principal al [phone] para una actualizacin sobre el [place] de cualquier retraso o cierre.  Consejos para la medicacin en dermatologa: Por favor, guarde las cajas en las que vienen los medicamentos de uso tpico para ayudarle a seguir las instrucciones sobre dnde y cmo usarlos. Las farmacias generalmente imprimen las instrucciones del medicamento slo en las cajas y no directamente en los tubos del [place].   Si su medicamento es muy caro, por favor, pngase en contacto con landry rieger llamando al [phone] y presione la opcin 4 o envenos un mensaje a travs de Clinical cytogeneticist.   No podemos decirle cul ser su copago  por los medicamentos por adelantado ya que esto es diferente dependiendo de la cobertura de su seguro. Sin embargo, es posible que podamos encontrar un medicamento sustituto a Audiological scientist un formulario para que el seguro cubra el medicamento que se considera necesario.   Si se requiere una autorizacin previa para que su compaa de seguros malta su medicamento, por favor permtanos de 1 a 2 das hbiles para completar este proceso.  Los precios de los medicamentos varan con frecuencia dependiendo del Environmental consultant de dnde se surte la receta y alguna farmacias pueden ofrecer precios ms baratos.  El sitio web www.goodrx.com tiene cupones para medicamentos de Health and safety inspector. Los precios aqu no tienen en cuenta lo que podra costar con la ayuda del seguro (puede ser ms barato con su seguro), pero el sitio web puede darle el precio si no utiliz Tourist information centre manager.  - Puede imprimir el cupn correspondiente y llevarlo con su receta a la farmacia.  -  Tambin puede pasar por nuestra oficina durante el horario de atencin regular y Education officer, museum una tarjeta de cupones de GoodRx.  - Si necesita que su receta se enve electrnicamente a una farmacia diferente, informe a nuestra oficina a travs de MyChart de [place] o por telfono llamando al [phone] y presione la opcin 4.

## 2024-03-05 NOTE — Progress Notes (Unsigned)
 Care Guide Pharmacy Note  03/05/2024 Name: Barbara Anderson MRN: [id number] DOB: 1971-07-03  Referred By: Ostwalt, Janna, PA-C Reason for referral: Call Attempt #1 and Complex Care Management (Outreach to schedule referral with pharmacist )   Barbara Anderson is a 53 y.o. year old female who is a primary care patient of Ostwalt, Janna, PA-C.  Barbara Anderson was referred to the pharmacist for assistance related to: chronic pain   A second unsuccessful telephone outreach was attempted today to contact the patient who was referred to the pharmacy team for assistance with medication management. Additional attempts will be made to contact the patient.  Thedford Franks, CMA, Care Guide [facility]  [facility], [facility] Guide Direct Dial: [phone]  Fax: [phone] Website: [email or web address]

## 2024-03-06 NOTE — Progress Notes (Signed)
 Care Guide Pharmacy Note  03/06/2024 Name: Barbara Anderson MRN: [id number] DOB: Jan 22, 1971  Referred By: Ostwalt, Janna, PA-C Reason for referral: Call Attempt #1 and Complex Care Management (Outreach to schedule referral with pharmacist )   Barbara Anderson is a 53 y.o. year old female who is a primary care patient of Ostwalt, Janna, PA-C.  Barbara Anderson was referred to the pharmacist for assistance related to: chronic pain   A third unsuccessful telephone outreach was attempted today to contact the patient who was referred to the pharmacy team for assistance with medication management. The Population Health team is pleased to engage with this patient at any time in the future upon receipt of referral and should he/she be interested in assistance from the Population Health team.  Barbara Anderson, CMA [facility]  [facility], [facility] Guide Direct Dial: [phone]  Fax: [phone] Website: [email or web address]

## 2024-03-15 DIAGNOSIS — I10 Essential (primary) hypertension: Secondary | ICD-10-CM | POA: Diagnosis not present

## 2024-03-15 DIAGNOSIS — R413 Other amnesia: Secondary | ICD-10-CM | POA: Diagnosis not present

## 2024-03-15 DIAGNOSIS — F3181 Bipolar II disorder: Secondary | ICD-10-CM | POA: Diagnosis not present

## 2024-03-23 DIAGNOSIS — M4726 Other spondylosis with radiculopathy, lumbar region: Secondary | ICD-10-CM | POA: Diagnosis not present

## 2024-03-23 DIAGNOSIS — M47816 Spondylosis without myelopathy or radiculopathy, lumbar region: Secondary | ICD-10-CM | POA: Diagnosis not present

## 2024-04-01 ENCOUNTER — Other Ambulatory Visit: Payer: Self-pay | Admitting: Physician Assistant

## 2024-04-01 DIAGNOSIS — R0981 Nasal congestion: Secondary | ICD-10-CM

## 2024-04-01 DIAGNOSIS — E1159 Type 2 diabetes mellitus with other circulatory complications: Secondary | ICD-10-CM

## 2024-04-01 DIAGNOSIS — R0982 Postnasal drip: Secondary | ICD-10-CM

## 2024-04-01 DIAGNOSIS — E034 Atrophy of thyroid (acquired): Secondary | ICD-10-CM

## 2024-04-01 DIAGNOSIS — E782 Mixed hyperlipidemia: Secondary | ICD-10-CM

## 2024-04-01 DIAGNOSIS — L700 Acne vulgaris: Secondary | ICD-10-CM

## 2024-04-01 MED ORDER — SPIRONOLACTONE 25 MG PO TABS: 25.0000 mg | ORAL_TABLET | Freq: Every day | ORAL | 3 refills | Status: AC

## 2024-04-01 MED ORDER — LISINOPRIL 5 MG PO TABS: 5.0000 mg | ORAL_TABLET | Freq: Every day | ORAL | 3 refills | Status: AC

## 2024-04-01 MED ORDER — ATORVASTATIN CALCIUM 20 MG PO TABS: 20.0000 mg | ORAL_TABLET | Freq: Every day | ORAL | 3 refills | Status: AC

## 2024-04-01 MED ORDER — METOPROLOL SUCCINATE ER 25 MG PO TB24: 25.0000 mg | ORAL_TABLET | Freq: Every day | ORAL | 3 refills | Status: AC

## 2024-04-01 MED ORDER — LEVOCETIRIZINE DIHYDROCHLORIDE 5 MG PO TABS: 5.0000 mg | ORAL_TABLET | Freq: Every evening | ORAL | 3 refills | Status: AC

## 2024-04-01 MED ORDER — LEVOTHYROXINE SODIUM 25 MCG PO TABS: 25.0000 ug | ORAL_TABLET | Freq: Every day | ORAL | 3 refills | Status: AC

## 2024-04-01 NOTE — Telephone Encounter (Signed)
 [facility] faxed refill request for the following medications:   lisinopril  (ZESTRIL ) 5 MG tablet   metoprolol  succinate (TOPROL -XL) 25 MG 24 hr tablet   omeprazole (PRILOSEC) 40 MG capsule   pregabalin  (LYRICA ) 200 MG capsule   atorvastatin  (LIPITOR) 20 MG tablet    levocetirizine (XYZAL ) 5 MG tablet   levothyroxine  (SYNTHROID ) 25 MCG tablet   spironolactone  (ALDACTONE ) 25 MG tablet      Please advise.

## 2024-04-03 NOTE — Telephone Encounter (Signed)
 Last dispense for omeprazole and gabapentin  was on 03/31/24.

## 2024-04-07 ENCOUNTER — Other Ambulatory Visit: Payer: Self-pay

## 2024-04-07 MED ORDER — DUTASTERIDE 0.5 MG PO CAPS: 0.5000 mg | ORAL_CAPSULE | Freq: Every day | ORAL | 0 refills | Status: AC

## 2024-04-07 NOTE — Progress Notes (Signed)
 Fax request to change RX to [facility]. aw

## 2024-04-09 NOTE — Telephone Encounter (Unsigned)
 Copied from CRM #[id number]. Topic: Clinical - Prescription Issue >> Apr 08, 2024  5:15 PM Debby BROCKS wrote: Reason for CRM: [facility] called in asking if the prescription surescripts have been received for: pregabalin  (LYRICA ) 200 MG capsule omeprazole (PRILOSEC) 40 MG capsule  They were sent Sept 3rd but nothing has been given to the pharmacy yet

## 2024-04-10 ENCOUNTER — Telehealth: Payer: Self-pay | Admitting: Physician Assistant

## 2024-04-10 DIAGNOSIS — G8929 Other chronic pain: Secondary | ICD-10-CM

## 2024-04-10 DIAGNOSIS — G894 Chronic pain syndrome: Secondary | ICD-10-CM

## 2024-04-10 DIAGNOSIS — M542 Cervicalgia: Secondary | ICD-10-CM

## 2024-04-10 DIAGNOSIS — M51379 Other intervertebral disc degeneration, lumbosacral region without mention of lumbar back pain or lower extremity pain: Secondary | ICD-10-CM

## 2024-04-10 DIAGNOSIS — M797 Fibromyalgia: Secondary | ICD-10-CM

## 2024-04-10 NOTE — Telephone Encounter (Signed)
 [facility] faxed refill request for the following medications:  pregabalin  (LYRICA ) 200 MG capsule    Please advise.

## 2024-04-14 NOTE — Telephone Encounter (Signed)
 Spoke with pt and advised, pt verbalized understanding.

## 2024-04-16 DIAGNOSIS — F3181 Bipolar II disorder: Secondary | ICD-10-CM | POA: Diagnosis not present

## 2024-04-16 DIAGNOSIS — R413 Other amnesia: Secondary | ICD-10-CM | POA: Diagnosis not present

## 2024-04-16 DIAGNOSIS — I10 Essential (primary) hypertension: Secondary | ICD-10-CM | POA: Diagnosis not present

## 2024-04-16 MED ORDER — PREGABALIN 200 MG PO CAPS
200.0000 mg | ORAL_CAPSULE | Freq: Every day | ORAL | 0 refills | Status: AC
Start: 2024-04-16 — End: ?

## 2024-04-30 ENCOUNTER — Telehealth: Payer: Self-pay | Admitting: Physician Assistant

## 2024-04-30 NOTE — Telephone Encounter (Signed)
 [facility] faxed refill request for the following medications:  gabapentin  (NEURONTIN ) 300 MG capsule    Please advise.

## 2024-05-04 NOTE — Progress Notes (Signed)
 ------------------------------------------------------------------------------- Attestation signed by Orlando Gladies Goldsmith, MD at 05/06/24 [phone] (Updated) I have reviewed and agree with this note by Dola Leep, NP, as her supervising physician. Gladies Orlando, MD  -------------------------------------------------------------------------------   Chronic Pain Follow Up Note  Assessment and Plan: Barbara Anderson is a 53 y.o. being followed at [facility] for complaint of chronic pain localized to her lower back with radiation into her right leg secondary to lumbar radiculopathy and axial back pain in the setting of facet arthropathy.  She intermittently undergoes transforaminal epidural steroid injections as well as lumbar radiofrequency ablation.  PLAN: Lumbar facet arthropathy  Myofascial Pain Syndrome Assessment & Plan Chronic neck and lower back myofascial pain Chronic myofascial pain persists in neck and lower back. Reports ongoing 50% relief post-radiofrequency ablation of L3, L4, L5 medial branch on 03/23/24. Previous trigger point injection provided 80% relief and improved range of motion for a year. Continues gabapentin , pregabalin , and tizanidine  as needed. No new symptoms. She would like repeat TPIs.  Had about 80% relief for about 6-8 months from past RFA procedure performed on 11/22/21 - Schedule repeat trigger point injections. - Refill tizanidine  prescription. -Continue home exercise program - Topamax  200 mg nightly.   Repeat TPI must document:  -Pt with focal area of pain in skeletal muscle: Bilateral trapezius and rhomboids, bilateral lumbar paraspinal muscles  - Exam reveals hyperirritable spot and taut band identified by palpation  - Area of pain has restricted range of motion: Yes. - Pt has failed non-invasive conservative therapy and has limited movement of affected area - Pt involved in ongoing conservative treatment program including HEP - Pt with  previous TPI on 01/2023 with pain relief of 80% for a year - Pt noted functional improvement after last TPI of >50% - Now with recurrence of myofascial pain resulting functional limitations including: limitations with ADLS, driving and ROM.  - Dates of previous TPIs in 12 month rolling period:  02/14/2023  Requested Prescriptions   Signed Prescriptions Disp Refills  . tizanidine  (ZANAFLEX ) 4 MG capsule 30 capsule 2    Sig: Take 1 capsule (4 mg total) by mouth nightly as needed for muscle spasms.   Orders Placed This Encounter  Procedures  . Trigger Point Injs 3+ Muscles [phone])    Bilateral trapezius and rhomboids, bilateral lumbar paraspinal muscles    Standing Status:   Future    Expected Date:   05/05/2024    Expiration Date:   05/05/2025    HPI: Barbara Anderson is a 53 y.o. being followed at [facility] for complaint of chronic pain localized to her neck, shoulder, lower back, and BLE. Patient has a PMH significant for HTN, traumatic brain injury with LOC, migraine, bipolar disorder, and depression.  She does have a history of fibromyalgia.  She was seen in initial consultation with our clinic in August 2020, specifically for procedural intervention.  Her medication regimen upon presentation consisted of gabapentin , Lyrica , Cymbalta , and Topamax , all prescribed by her PCP although we have taken over prescribing some of these. She is also on a chronic benzodiazepine.  History of Present Illness Barbara Anderson is a 53 year old female who presents for post-procedure follow-up after radiofrequency ablation.  She underwent radiofrequency ablation of bilateral L3, L4, L5 medial branch on March 23, 2024, resulting in 50% ongoing pain relief. However, she continues to experience muscle-related pain in her neck and lower back.  Denies new symptoms at this time.   Previously, she received trigger point  injections on February 14, 2023, which provided 80% pain relief and improved her range of  motion in the neck, lasting about a year. She wants repeat trigger point injections, as she reports they provided significant relief in the past.  She continues to take gabapentin  and pregabalin  as prescribed. Additionally, she uses tizanidine  4 mg at night as needed, which she finds helpful.  Current analgesic regimen: Tizanidine  4 mg HS PRN - taking mostly once daily Gabapentin  300mg  TID Xanax prn (PCP) - takes only when going out  Cymbalta  60 mg daily (PCP) Trazodone  150 mg at bedtime (PCP), reports not taking Lyrica  200 mg BID Topamax  200 mg qhs  The patient states her pain is located axial low back and neck and the severity of her pain ranges from 10/10 to 10/10.  Her pain currently is 10/10 and on average is 10/10.  She describes the sensation of her pain as aching, pulsing, sharp, shooting, sore, stabbing, throbbing. Her pain is present all of the time and worst evenings. The patient's pain impacts enjoyment of life, general activity, mood, normal work, recreational activities, relationships, sleep, walking, sitting, standing. Her interval history includes None. Her pain has stayed the same, and she does not have new pain to discuss today.  In regards to medications currently taken for pain management, the patient is tolerating these medications well and complains of associated side effects: dry mouth.  Medication Monitoring: [place] reviewed today and appropriate.  No opioids.  Last filled her alprazolam on March 2023.  Previous Medication Trials: NSAIDS - Mobic  Antidepressants - Cymbalta  Neuroleptics - gabapentin , Lyrica , topamax  Muscle relaxants -  tizanidine  Topicals - lidocaine , Voltaren Short-acting opiates - oyxcodone Long-acting opiates - na Anxiolytics - alprazalam   Previous Interventions: Right L5/S1 TFESI (04/01/19, 03/18/20, 06/21/20) -100% relief x 11-12 months after initial; last in November 2021 less helpful Bilateral L3-L5 MBNB (05/13/19, 06/05/19) TPI's  (06/21/20) - helpful Right L3-L5 RFA (06/12/19, 12/07/19, 09/16/2020) - excellent relief except for one spot Left L3-L5 RFA (08/21/18, 12/18/19, 10/14/2020) - excellent relief Bilateral L3-5 radiofrequency ablation (04/26/2021) -70% benefit; repeat 11/22/2021: Helpful  Imaging: MRI Lumbar Spine 10/23/21 Multilevel degenerative disc and facet disease of the lumbar spine greatest at L2-3 which results in moderate spinal canal stenosis.     L3-4 right subarticular/foraminal disc protrusion which abuts the right L4 nerve root. Correlate for radiculopathy.     Multiple levels of mild to moderate foraminal stenosis at above greatest at L2-3 and L3-4.   Allergies Allergies  Allergen Reactions  . Metformin  Diarrhea    Home Medications   Current Outpatient Medications  Medication Sig Dispense Refill  . ALPRAZolam (XANAX) 1 MG tablet Take 1 tablet (1 mg total) by mouth in the morning.    . ARIPiprazole  (ABILIFY ) 5 MG tablet Take 1 tablet (5 mg total) by mouth two (2) times a day.    . cholecalciferol, vitamin D3-125 mcg, 5,000 unit,, 125 mcg (5,000 unit) tablet Take 1 tablet (125 mcg total) by mouth once a week.    SABRA dextroamphetamine-amphetamine (ADDERALL) 20 mg tablet Take 1 tablet (20 mg total) by mouth in the morning.    . DULoxetine  (CYMBALTA ) 60 MG capsule Take 2 capsules (120 mg total) by mouth in the morning.    . ferrous sulfate 325 (65 FE) MG tablet Take 1 tablet (325 mg total) by mouth in the morning.    . fluticasone  propionate (FLONASE ) 50 mcg/actuation nasal spray     . folic acid  (FOLVITE ) 1 MG tablet  Take 1 tablet (1,000 mcg total) by mouth in the morning.    . gabapentin  (NEURONTIN ) 300 MG capsule Take 1 capsule (300 mg total) by mouth Three (3) times a day. 240 capsule 3  . levothyroxine  (SYNTHROID ) 25 MCG tablet     . omeprazole (PRILOSEC) 40 MG capsule Take 1 capsule (40 mg total) by mouth.    . pregabalin  (LYRICA ) 200 MG capsule TAKE ONE CAPSULE (200 MG TOTAL) BY MOUTH TWICE  DAILY 60 capsule 2  . QUEtiapine  (SEROQUEL  XR) 150 mg Tb24 extended released tablet Take 1 tablet (150 mg total) by mouth in the morning.    . tizanidine  (ZANAFLEX ) 4 MG capsule Take 1 capsule (4 mg total) by mouth Three (3) times a day as needed for muscle spasms. 30 capsule 5  . topiramate  (TOPAMAX ) 200 MG tablet Take 1 tablet (200 mg total) by mouth nightly. 90 tablet 1  . traZODone  (DESYREL ) 150 MG tablet Take 1 tablet (150 mg total) by mouth nightly.     No current facility-administered medications for this visit.   Review Of Systems: General snoring, fatigue Cardiovascular none Gastrointestinal none Skin none Endocrine change in hair or skin texture, thinning hair Musculoskeletal back pain, muscle aches/weakness Neurologic dizziness/vertigo Psychiatric anxiety, low energy, panic attacks  Physical Exam  VITALS:  Vitals:   05/05/24 1049  BP: 124/85  Pulse: 86  SpO2: 98%   Wt Readings from Last 6 Encounters:  05/05/24 89.8 kg (198 lb)  12/06/22 88.5 kg (195 lb)  03/16/22 (!) 126 kg (277 lb 12.8 oz)  12/22/21 (!) 129.4 kg (285 lb 3.2 oz)  10/23/21 (!) 127.9 kg (282 lb)  05/03/21 (!) 111.1 kg (244 lb 14.4 oz)   GENERAL: The patient is well developed, well-nourished and appears to be in no apparent distress. The patient is pleasant and interactive. Patient is a good historian. HEAD/NECK:   Reveals normocephalic/atraumatic. clear sclera. Mucous membranes are moist. HEART:  RRR, no MRG LUNGS:  Normal work of breathing EXTREMITIES: No clubbing, cyanosis noted. NEUROLOGIC:   The patient was alert and oriented, speech fluent, normal language. CN 2-12 grossly intact. MUSCULOSKELETAL:   Motor function preserved as previous. Good range of motion of all extremities. The patient was able to ambulate without difficult throughout the clinic today with the assistance of a walking aid. Positive TTP along the lumbar paraspinal muscles bilaterally.  SKIN:  No obvious rashes,  lesions, or erythema. PSY: Appropriate affect

## 2024-05-05 DIAGNOSIS — M545 Low back pain, unspecified: Secondary | ICD-10-CM | POA: Diagnosis not present

## 2024-05-05 DIAGNOSIS — M7918 Myalgia, other site: Secondary | ICD-10-CM | POA: Diagnosis not present

## 2024-05-06 ENCOUNTER — Other Ambulatory Visit: Payer: Self-pay

## 2024-05-06 DIAGNOSIS — F902 Attention-deficit hyperactivity disorder, combined type: Secondary | ICD-10-CM | POA: Diagnosis not present

## 2024-05-06 DIAGNOSIS — F411 Generalized anxiety disorder: Secondary | ICD-10-CM | POA: Diagnosis not present

## 2024-05-06 DIAGNOSIS — F3132 Bipolar disorder, current episode depressed, moderate: Secondary | ICD-10-CM | POA: Diagnosis not present

## 2024-05-06 DIAGNOSIS — F4312 Post-traumatic stress disorder, chronic: Secondary | ICD-10-CM | POA: Diagnosis not present

## 2024-05-12 DIAGNOSIS — H5212 Myopia, left eye: Secondary | ICD-10-CM | POA: Diagnosis not present

## 2024-05-12 DIAGNOSIS — Z9889 Other specified postprocedural states: Secondary | ICD-10-CM | POA: Diagnosis not present

## 2024-05-12 DIAGNOSIS — H2513 Age-related nuclear cataract, bilateral: Secondary | ICD-10-CM | POA: Diagnosis not present

## 2024-05-17 DIAGNOSIS — R413 Other amnesia: Secondary | ICD-10-CM | POA: Diagnosis not present

## 2024-05-17 DIAGNOSIS — F3181 Bipolar II disorder: Secondary | ICD-10-CM | POA: Diagnosis not present

## 2024-05-17 DIAGNOSIS — I10 Essential (primary) hypertension: Secondary | ICD-10-CM | POA: Diagnosis not present

## 2024-05-22 DIAGNOSIS — M7918 Myalgia, other site: Secondary | ICD-10-CM | POA: Diagnosis not present

## 2024-05-25 ENCOUNTER — Telehealth: Payer: Self-pay

## 2024-05-25 NOTE — Telephone Encounter (Signed)
 Patient left VM asking for Hydroquinone  to be sent to [facility]. Per Dr. Theressa last office note - patient to hold then start [facility] compounded prescription. Called patient but no answer or voicemail option. aw

## 2024-06-01 ENCOUNTER — Other Ambulatory Visit: Payer: Self-pay

## 2024-06-01 ENCOUNTER — Telehealth: Payer: Self-pay

## 2024-06-01 NOTE — Telephone Encounter (Signed)
 Patient did confirm email address we have listed for contact from [facility]. Barbara Anderson., RMA

## 2024-06-01 NOTE — Telephone Encounter (Signed)
 Patient LVM asking about rx for higher strength hydroquinone . Last office visit note says that we will send to [facility]. Are you sending in? Lonell RAMAN., RMA

## 2024-06-01 NOTE — Progress Notes (Signed)
 Error

## 2024-06-01 NOTE — Telephone Encounter (Signed)
 Patient left another voicemail requesting hydroquinone  be sent to [facility]. I returned her call, but the voicemail box is full.

## 2024-06-03 ENCOUNTER — Ambulatory Visit: Admitting: Physician Assistant

## 2024-06-03 DIAGNOSIS — F4312 Post-traumatic stress disorder, chronic: Secondary | ICD-10-CM | POA: Diagnosis not present

## 2024-06-03 DIAGNOSIS — F411 Generalized anxiety disorder: Secondary | ICD-10-CM | POA: Diagnosis not present

## 2024-06-03 DIAGNOSIS — F902 Attention-deficit hyperactivity disorder, combined type: Secondary | ICD-10-CM | POA: Diagnosis not present

## 2024-06-03 DIAGNOSIS — F3132 Bipolar disorder, current episode depressed, moderate: Secondary | ICD-10-CM | POA: Diagnosis not present

## 2024-06-04 ENCOUNTER — Other Ambulatory Visit: Payer: Self-pay

## 2024-06-04 MED ORDER — TRETINOIN 0.025 % EX CREA: TOPICAL_CREAM | Freq: Two times a day (BID) | CUTANEOUS | 2 refills | Status: AC

## 2024-06-10 ENCOUNTER — Ambulatory Visit: Admitting: Physician Assistant

## 2024-06-10 VITALS — BP 118/63 | HR 70 | Temp 98.9°F | Ht 67.0 in | Wt 220.0 lb

## 2024-06-10 DIAGNOSIS — M51379 Other intervertebral disc degeneration, lumbosacral region without mention of lumbar back pain or lower extremity pain: Secondary | ICD-10-CM | POA: Diagnosis not present

## 2024-06-10 DIAGNOSIS — I152 Hypertension secondary to endocrine disorders: Secondary | ICD-10-CM

## 2024-06-10 DIAGNOSIS — G894 Chronic pain syndrome: Secondary | ICD-10-CM

## 2024-06-10 DIAGNOSIS — E1169 Type 2 diabetes mellitus with other specified complication: Secondary | ICD-10-CM | POA: Diagnosis not present

## 2024-06-10 DIAGNOSIS — M5441 Lumbago with sciatica, right side: Secondary | ICD-10-CM

## 2024-06-10 DIAGNOSIS — E669 Obesity, unspecified: Secondary | ICD-10-CM | POA: Diagnosis not present

## 2024-06-10 DIAGNOSIS — E782 Mixed hyperlipidemia: Secondary | ICD-10-CM

## 2024-06-10 DIAGNOSIS — E034 Atrophy of thyroid (acquired): Secondary | ICD-10-CM | POA: Diagnosis not present

## 2024-06-10 DIAGNOSIS — G8929 Other chronic pain: Secondary | ICD-10-CM

## 2024-06-10 DIAGNOSIS — E1159 Type 2 diabetes mellitus with other circulatory complications: Secondary | ICD-10-CM

## 2024-06-10 DIAGNOSIS — E785 Hyperlipidemia, unspecified: Secondary | ICD-10-CM

## 2024-06-10 DIAGNOSIS — E1129 Type 2 diabetes mellitus with other diabetic kidney complication: Secondary | ICD-10-CM

## 2024-06-10 NOTE — Progress Notes (Addendum)
 Established patient visit  Patient: Barbara Anderson   DOB: [birth year]   53 y.o. Female  MRN: [id number] Visit Date: 06/10/2024  Today's healthcare provider: Jolynn Spencer, PA-C   Chief Complaint  Patient presents with   Hypertension   Hypothyroidism   Hyperlipidemia   Diabetes    Patient reports glucose running 90-110 at home.   Weight Management Screening    Patient would like to get something to curb her appetite.   Subjective       Discussed the use of AI scribe software for clinical note transcription with the patient, who gave verbal consent to proceed.  History of Present Illness Barbara Anderson is a 53 year old female with chronic back pain who presents for pain management and medication review.  She experiences chronic lower back pain with radiation to the right side, persisting despite recent injections for her lower and upper back. Her pain management regimen includes pregabalin  150 mg once daily, gabapentin , and Cymbalta , which also addresses her depression. She has not started physical therapy post-procedure and expresses a dislike for it.  Her medical history includes hypertension, hyperlipidemia, thyroid  disorder, depression, and diabetes. Hypertension is managed with lisinopril  2.5 mg and metoprolol  25 mg, with stable blood pressure readings. Hyperlipidemia is treated with atorvastatin  20 mg, and she is on levothyroxine  25 mcg for thyroid  management, with a need to recheck thyroid  function.  She has noticed an increased appetite and is concerned about weight management. She was previously on Ozempic  for diabetes and wants to resume it to help manage her appetite and weight. In terms of behavioral health, she attends [facility] and is on Adderall, Vraylar, and Cymbalta , expressing satisfaction with her current management.  No issues with bowel movements, shortness of breath, chest pain, or rapid heart beating. She reports sinus drainage but no other respiratory  symptoms.       06/10/2024    2:22 PM 03/03/2024    1:54 PM 02/10/2024   10:56 AM  Depression screen PHQ 2/9  Decreased Interest 0 0 0  Down, Depressed, Hopeless 0 0 0  PHQ - 2 Score 0 0 0  Altered sleeping 0 2 2  Tired, decreased energy 1 0 1  Change in appetite 3 1 1   Feeling bad or failure about yourself  0 0 0  Trouble concentrating 0 0 0  Moving slowly or fidgety/restless 0 0 0  Suicidal thoughts 0 0 0  PHQ-9 Score 4 3  4    Difficult doing work/chores Somewhat difficult Somewhat difficult Somewhat difficult     Data saved with a previous flowsheet row definition      06/10/2024    2:22 PM 03/03/2024    1:54 PM 02/10/2024   10:56 AM 10/29/2023    1:24 PM  GAD 7 : Generalized Anxiety Score  Nervous, Anxious, on Edge 0 0 0 0  Control/stop worrying 0 0 0 0  Worry too much - different things 0 0 0 0  Trouble relaxing 0 0  0  Restless 0 0  0  Easily annoyed or irritable 0 0  0  Afraid - awful might happen 0 0  0  Total GAD 7 Score 0 0  0  Anxiety Difficulty Not difficult at all   Not difficult at all    Medications: Outpatient Medications Prior to Visit  Medication Sig   Adapalene  (DIFFERIN ) 0.3 % gel Apply 1 application topically at bedtime.   Alcohol Swabs (B-D SINGLE USE SWABS REGULAR) PADS USE TO CLEANSE  SKIN EVERY DAY BEFORE CHECKING BLOOD SUGAR   amphetamine-dextroamphetamine (ADDERALL) 20 MG tablet Take 20 mg by mouth in the morning, at noon, and at bedtime. Has not started yet 11/10/19   atorvastatin  (LIPITOR) 20 MG tablet Take 1 tablet (20 mg total) by mouth daily.   azelastine  (ASTELIN ) 0.1 % nasal spray Place 1 spray into both nostrils 2 (two) times daily. Use in each nostril as directed   carboxymethylcellulose (LUBRICANT EYE DROPS) 0.5 % SOLN Place 1 drop into the left eye in the morning and at bedtime. For two weeks.   Cholecalciferol (VITAMIN D3) 25 MCG (1000 UT) CAPS Take 1 capsule (1,000 Units total) by mouth daily.   DULoxetine  (CYMBALTA ) 60 MG capsule  Take 2 capsules (120 mg total) by mouth daily.   dutasteride  (AVODART ) 0.5 MG capsule Take 1 capsule (0.5 mg total) by mouth daily.   etonogestrel  (NEXPLANON ) 68 MG IMPL implant 1 each by Subdermal route once.   ferrous sulfate 325 (65 FE) MG tablet Take by mouth.   fluticasone  (FLONASE ) 50 MCG/ACT nasal spray USE 2 SPRAYS IN EACH NOSTRIL DAILY   folic acid  (FOLVITE ) 800 MCG tablet Take 1 tablet (800 mcg total) by mouth daily.   gabapentin  (NEURONTIN ) 300 MG capsule Take 1 capsule (300 mg total) by mouth at bedtime.   hydrocortisone  (ANUSOL -HC) 2.5 % rectal cream Place 1 Application rectally 2 (two) times daily.   hydrocortisone  2.5 % ointment Apply topically 2 (two) times daily. Do not use for more than 1-2 weeks at a time.   hydroquinone  4 % cream APPLY TO THE AFFECTED AREA ON SKIN EVERY NIGHT AT BEDTIME   ipratropium (ATROVENT ) 0.03 % nasal spray 1-2 puffs twice daily for 3 days   Lancets Misc. (ACCU-CHEK FASTCLIX LANCET) KIT To check blood sugar once daily   levocetirizine (XYZAL ) 5 MG tablet Take 1 tablet (5 mg total) by mouth every evening.   levothyroxine  (SYNTHROID ) 25 MCG tablet Take 1 tablet (25 mcg total) by mouth daily before breakfast.   lisinopril  (ZESTRIL ) 5 MG tablet Take 1 tablet (5 mg total) by mouth daily.   loratadine  (CLARITIN ) 10 MG tablet Take 1 tablet (10 mg total) by mouth daily.   metoCLOPramide (REGLAN) 10 MG tablet Take by mouth.   metoprolol  succinate (TOPROL -XL) 25 MG 24 hr tablet Take 1 tablet (25 mg total) by mouth daily.   Multiple Vitamin tablet Take 1 tablet by mouth daily.   Olopatadine  HCl 0.2 % SOLN Apply 1 drop to eye daily. One drop to L eye twice daily for two weeks.   omeprazole (PRILOSEC) 40 MG capsule Take 40 mg by mouth daily.   pimecrolimus  (ELIDEL ) 1 % cream Apply to scalp BID.   pregabalin  (LYRICA ) 200 MG capsule Take 1 capsule (200 mg total) by mouth daily.   ramelteon  (ROZEREM ) 8 MG tablet TAKE ONE TABLET BY MOUTH EVERY DAY AT BEDTIME    spironolactone  (ALDACTONE ) 25 MG tablet Take 1 tablet (25 mg total) by mouth daily.   tiZANidine  (ZANAFLEX ) 2 MG tablet Take 1 tablet (2 mg total) by mouth 3 (three) times daily as needed for muscle spasms.   tretinoin  (RETIN-A ) 0.025 % cream Apply topically 2 (two) times daily. BID until desired color achieved. Stop after 3 months and take at least 3 months off.   Vitamin D , Ergocalciferol , (DRISDOL ) 1.25 MG (50000 UNIT) CAPS capsule Take 1 capsule (50,000 Units total) by mouth every 7 (seven) days. Take with meal.   VRAYLAR 1.5 MG capsule Take 1.5  mg by mouth daily.   No facility-administered medications prior to visit.    Review of Systems All negative Except see HPI       Objective    BP 118/63 (BP Location: Left Arm, Patient Position: Sitting, Cuff Size: Large)   Pulse 70   Temp 98.9 F (37.2 C) (Oral)   Ht 5' 7 (1.702 m)   Wt 220 lb (99.8 kg)   SpO2 100%   BMI 34.46 kg/m     Physical Exam Vitals reviewed.  Constitutional:      General: She is not in acute distress.    Appearance: Normal appearance. She is well-developed. She is not diaphoretic.  HENT:     Head: Normocephalic and atraumatic.  Eyes:     General: No scleral icterus.    Conjunctiva/sclera: Conjunctivae normal.  Neck:     Thyroid : No thyromegaly.  Cardiovascular:     Rate and Rhythm: Normal rate and regular rhythm.     Pulses: Normal pulses.     Heart sounds: Normal heart sounds. No murmur heard. Pulmonary:     Effort: Pulmonary effort is normal. No respiratory distress.     Breath sounds: Normal breath sounds. No wheezing, rhonchi or rales.  Musculoskeletal:     Cervical back: Neck supple.     Right lower leg: No edema.     Left lower leg: No edema.  Lymphadenopathy:     Cervical: No cervical adenopathy.  Skin:    General: Skin is warm and dry.     Findings: No rash.  Neurological:     Mental Status: She is alert and oriented to person, place, and time. Mental status is at baseline.   Psychiatric:        Mood and Affect: Mood normal.        Behavior: Behavior normal.      No results found for any visits on 06/10/24.      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 Concerns about polypharmacy and potential side effects. Recent injections for pain management. Not engaging in physical therapy, which is recommended for recovery and prevention of chronic pain recurrence. - Per chart review, pain specialist prescribe pt muscle relaxant , topamax . Per dispense history, she has not been dispensing them. Pt was advised to Coordinate with pain management specialist, Dr. Orlando, to adjust all her pain medications including gabapentin  and pregabalin  dosages.  Last dispense of pregabalin  was a courtesy refill on 02/27/24.Pt was advised that it will be safer if pain clinic will manage all her pain medications.  Reiterated to discussed/adjust her current pain regimen with pain specialist to avoid polypharmacy. Pt was advised that it will be not appropriate for us  to prescribe her pain medications going forward due to her medications being spread between pain specialist, neurologist, primary and [facility] specialist.  - Encouraged initiation of physical therapy. Pt declined at the moment.   Type 2 diabetes mellitus Chronic Type 2 diabetes mellitus. Associated with HTN, HLD, obesity. Previous use of Ozempic  for weight management. Current A1c levels need assessment to determine appropriate management. Last A1c was 5.7 - Ordered A1c test to assess current diabetes control. - Consider reinitiating Ozempic  for weight management if insurance approves. Will follow-up  Obesity Chronic Associated with htn, hld, dmii. Previous weight loss on Ozempic /bariatric surgery. Current issues with appetite and weight management. Insurance coverage for weight loss medications is  uncertain. -  Check insurance coverage for weight loss medications. - Consider reinitiating Ozempic  if insurance approves. And after lab results will be back Will follow-up  Hypothyroidism due to acquired atrophy of thyroid  Chronic Hypothyroidism managed with levothyroxine  25 mcg daily. Need to recheck thyroid  function to ensure appropriate dosing. Will follow-up after TSH results  Hyperlipidemia Chronic, associated with DMII Managed with atorvastatin  20 mg daily. Previous lipid panel results need reassessment. - Ordered lipid panel to assess current lipid levels. Will fu    Orders Placed This Encounter  Procedures   Comprehensive metabolic panel with GFR   Hemoglobin A1c   Lipid Panel With LDL/HDL Ratio   TSH    Return in about 2 months (around 08/10/2024) for chronic disease f/u.   The patient was advised to call back or seek an in-person evaluation if the symptoms worsen or if the condition fails to improve as anticipated.  I discussed the assessment and treatment plan with the patient. The patient was provided an opportunity to ask questions and all were answered. The patient agreed with the plan and demonstrated an understanding of the instructions.  I, Gissele Narducci, PA-C have reviewed all documentation for this visit. The documentation on 06/10/2024  for the exam, diagnosis, procedures, and orders are all accurate and complete.  Jolynn Spencer, [facility], MMS [facility] [phone] (phone) [phone] (fax)  [facility]

## 2024-06-11 DIAGNOSIS — R945 Abnormal results of liver function studies: Secondary | ICD-10-CM | POA: Diagnosis not present

## 2024-06-11 DIAGNOSIS — E782 Mixed hyperlipidemia: Secondary | ICD-10-CM | POA: Diagnosis not present

## 2024-06-11 DIAGNOSIS — E669 Obesity, unspecified: Secondary | ICD-10-CM | POA: Diagnosis not present

## 2024-06-11 DIAGNOSIS — I152 Hypertension secondary to endocrine disorders: Secondary | ICD-10-CM | POA: Diagnosis not present

## 2024-06-11 DIAGNOSIS — R809 Proteinuria, unspecified: Secondary | ICD-10-CM | POA: Diagnosis not present

## 2024-06-11 DIAGNOSIS — R748 Abnormal levels of other serum enzymes: Secondary | ICD-10-CM | POA: Diagnosis not present

## 2024-06-11 DIAGNOSIS — E1159 Type 2 diabetes mellitus with other circulatory complications: Secondary | ICD-10-CM | POA: Diagnosis not present

## 2024-06-11 DIAGNOSIS — E1129 Type 2 diabetes mellitus with other diabetic kidney complication: Secondary | ICD-10-CM | POA: Diagnosis not present

## 2024-06-11 DIAGNOSIS — E034 Atrophy of thyroid (acquired): Secondary | ICD-10-CM | POA: Diagnosis not present

## 2024-06-12 ENCOUNTER — Ambulatory Visit: Payer: Self-pay | Admitting: Physician Assistant

## 2024-06-12 ENCOUNTER — Encounter: Payer: Self-pay | Admitting: Physician Assistant

## 2024-06-12 DIAGNOSIS — E669 Obesity, unspecified: Secondary | ICD-10-CM

## 2024-06-12 DIAGNOSIS — E1129 Type 2 diabetes mellitus with other diabetic kidney complication: Secondary | ICD-10-CM

## 2024-06-12 LAB — COMPREHENSIVE METABOLIC PANEL WITH GFR
ALT: 48 IU/L — ABNORMAL HIGH (ref 0–32)
AST: 32 IU/L (ref 0–40)
Albumin: 4.1 g/dL (ref 3.8–4.9)
Alkaline Phosphatase: 158 IU/L — ABNORMAL HIGH (ref 49–135)
BUN/Creatinine Ratio: 13 (ref 9–23)
BUN: 14 mg/dL (ref 6–24)
Bilirubin Total: 0.5 mg/dL (ref 0.0–1.2)
CO2: 22 mmol/L (ref 20–29)
Calcium: 8.5 mg/dL — ABNORMAL LOW (ref 8.7–10.2)
Chloride: 104 mmol/L (ref 96–106)
Creatinine, Ser: 1.08 mg/dL — ABNORMAL HIGH (ref 0.57–1.00)
Globulin, Total: 2.3 g/dL (ref 1.5–4.5)
Glucose: 103 mg/dL — ABNORMAL HIGH (ref 70–99)
Potassium: 4.8 mmol/L (ref 3.5–5.2)
Sodium: 140 mmol/L (ref 134–144)
Total Protein: 6.4 g/dL (ref 6.0–8.5)
eGFR: 61 mL/min/1.73 (ref >59)

## 2024-06-12 LAB — LIPID PANEL WITH LDL/HDL RATIO
Cholesterol, Total: 139 mg/dL (ref 100–199)
HDL: 53 mg/dL (ref >39)
LDL Chol Calc (NIH): 72 mg/dL (ref 0–99)
LDL/HDL Ratio: 1.4 ratio (ref 0.0–3.2)
Triglycerides: 69 mg/dL (ref 0–149)
VLDL Cholesterol Cal: 14 mg/dL (ref 5–40)

## 2024-06-12 LAB — HEMOGLOBIN A1C
Est. average glucose Bld gHb Est-mCnc: 123 mg/dL
Hgb A1c MFr Bld: 5.9 % — ABNORMAL HIGH (ref 4.8–5.6)

## 2024-06-12 LAB — TSH: TSH: 2.02 u[IU]/mL (ref 0.450–4.500)

## 2024-06-12 NOTE — Progress Notes (Signed)
 Please, check with [facility] if we could add acute hepatitis panel and GGT

## 2024-06-12 NOTE — Progress Notes (Signed)
 Called [facility] spoke with Hoy orders have been added. Acute Hepatitis # [id number] and GGT # [id number]

## 2024-06-15 LAB — ACUTE VIRAL HEPATITIS (HAV, HBV, HCV): HCV Ab: NONREACTIVE

## 2024-06-15 LAB — SPECIMEN STATUS REPORT

## 2024-06-15 LAB — GAMMA GT: GGT: 11 IU/L (ref 0–60)

## 2024-06-15 LAB — HCV INTERPRETATION

## 2024-06-17 ENCOUNTER — Other Ambulatory Visit: Payer: Self-pay | Admitting: Physician Assistant

## 2024-06-17 ENCOUNTER — Telehealth: Payer: Self-pay

## 2024-06-17 DIAGNOSIS — R413 Other amnesia: Secondary | ICD-10-CM | POA: Diagnosis not present

## 2024-06-17 DIAGNOSIS — M4726 Other spondylosis with radiculopathy, lumbar region: Secondary | ICD-10-CM | POA: Diagnosis not present

## 2024-06-17 DIAGNOSIS — F5104 Psychophysiologic insomnia: Secondary | ICD-10-CM | POA: Diagnosis not present

## 2024-06-17 DIAGNOSIS — F3181 Bipolar II disorder: Secondary | ICD-10-CM | POA: Diagnosis not present

## 2024-06-17 DIAGNOSIS — J3089 Other allergic rhinitis: Secondary | ICD-10-CM

## 2024-06-17 DIAGNOSIS — E7849 Other hyperlipidemia: Secondary | ICD-10-CM | POA: Diagnosis not present

## 2024-06-17 DIAGNOSIS — I10 Essential (primary) hypertension: Secondary | ICD-10-CM | POA: Diagnosis not present

## 2024-06-17 NOTE — Telephone Encounter (Signed)
 Copied from CRM [id number]. Topic: Clinical - Medication Question >> Jun 17, 2024 10:01 AM Fonda T wrote: Reason for CRM: Pt calling to inquire if she can be prescribed an appetite suppressant, Ozempic  or any other recommendations.  Pt can be reached back at [phone].  Pt is aware of same day call back.

## 2024-06-17 NOTE — Telephone Encounter (Signed)
 Please advise

## 2024-06-22 MED ORDER — OZEMPIC (0.25 OR 0.5 MG/DOSE) 2 MG/3ML ~~LOC~~ SOPN: PEN_INJECTOR | SUBCUTANEOUS | 1 refills | Status: DC

## 2024-06-22 MED ORDER — OZEMPIC (0.25 OR 0.5 MG/DOSE) 2 MG/3ML ~~LOC~~ SOPN: PEN_INJECTOR | SUBCUTANEOUS | 1 refills | Status: AC

## 2024-06-29 ENCOUNTER — Telehealth: Payer: Self-pay | Admitting: Physician Assistant

## 2024-06-29 DIAGNOSIS — G894 Chronic pain syndrome: Secondary | ICD-10-CM

## 2024-07-01 NOTE — Telephone Encounter (Signed)
 Called pt no answer, unable to leave message due to mailbox being full.

## 2024-07-02 NOTE — Telephone Encounter (Signed)
 Called pt no answer, unable to leave message due to mailbox being full.

## 2024-07-06 MED ORDER — GABAPENTIN 300 MG PO CAPS: 300.0000 mg | ORAL_CAPSULE | Freq: Every day | ORAL | 0 refills | Status: DC

## 2024-07-06 NOTE — Telephone Encounter (Signed)
 Noted

## 2024-07-06 NOTE — Telephone Encounter (Signed)
 Spoke with pt and advised provider question and recommendation. Pt stated she has reached out to them but has not heard anything, would like a courtesy refill sent in until. I advise to please assure she tries to schedule an appointment with them. Pt verbalized understanding.

## 2024-07-21 ENCOUNTER — Other Ambulatory Visit: Payer: Self-pay | Admitting: Physician Assistant

## 2024-07-21 DIAGNOSIS — J3089 Other allergic rhinitis: Secondary | ICD-10-CM

## 2024-08-07 ENCOUNTER — Telehealth: Payer: Self-pay

## 2024-08-07 NOTE — Telephone Encounter (Signed)
 Copied from CRM #[id number]. Topic: Clinical - Medication Prior Auth >> Aug 07, 2024 12:53 PM Sophia H wrote: Reason for CRM: Patient states insurance reached out and advised she will need a PA for Semaglutide ,0.25 or 0.5MG /DOS, (OZEMPIC , 0.25 OR 0.5 MG/DOSE,) 2 MG/3ML SOPN. Please submit and advise # [phone]

## 2024-08-10 ENCOUNTER — Telehealth: Payer: Self-pay

## 2024-08-10 ENCOUNTER — Other Ambulatory Visit (HOSPITAL_COMMUNITY): Payer: Self-pay

## 2024-08-10 NOTE — Telephone Encounter (Signed)
 Pharmacy Patient Advocate Encounter  Received notification from [employer] that Prior Authorization for Ozempic  2mg /78ml has been CANCELLED due to authorization already on file for this request   Per test claim, refill too soon, next fill is 08/20/24

## 2024-08-10 NOTE — Telephone Encounter (Signed)
 Toribio Laneta LABOR, CPhT    08/10/24  3:40 PM Note Pharmacy Patient Advocate Encounter   Received notification from [employer] that Prior Authorization for Ozempic  2mg /25ml has been CANCELLED due to authorization already on file for this request    Per test claim, refill too soon, next fill is 08/20/24

## 2024-08-10 NOTE — Telephone Encounter (Signed)
 Pharmacy Patient Advocate Encounter   Received notification from Pt Calls Messages that prior authorization for Ozempic  2mg /69ml is required/requested.   Insurance verification completed.   The patient is insured through [employer].   Per test claim: PA required; PA submitted to above mentioned insurance via Latent Key/confirmation #/EOC [id number] Status is pending

## 2024-08-12 ENCOUNTER — Ambulatory Visit: Admitting: Physician Assistant

## 2024-08-12 DIAGNOSIS — E1129 Type 2 diabetes mellitus with other diabetic kidney complication: Secondary | ICD-10-CM

## 2024-08-12 DIAGNOSIS — E1169 Type 2 diabetes mellitus with other specified complication: Secondary | ICD-10-CM

## 2024-08-12 DIAGNOSIS — E559 Vitamin D deficiency, unspecified: Secondary | ICD-10-CM

## 2024-08-12 DIAGNOSIS — I152 Hypertension secondary to endocrine disorders: Secondary | ICD-10-CM

## 2024-08-12 DIAGNOSIS — Z1159 Encounter for screening for other viral diseases: Secondary | ICD-10-CM

## 2024-08-12 DIAGNOSIS — F319 Bipolar disorder, unspecified: Secondary | ICD-10-CM

## 2024-08-12 DIAGNOSIS — E782 Mixed hyperlipidemia: Secondary | ICD-10-CM

## 2024-08-12 DIAGNOSIS — E034 Atrophy of thyroid (acquired): Secondary | ICD-10-CM

## 2024-08-12 DIAGNOSIS — Z1231 Encounter for screening mammogram for malignant neoplasm of breast: Secondary | ICD-10-CM

## 2024-08-12 DIAGNOSIS — F419 Anxiety disorder, unspecified: Secondary | ICD-10-CM

## 2024-08-12 DIAGNOSIS — I890 Lymphedema, not elsewhere classified: Secondary | ICD-10-CM

## 2024-08-19 ENCOUNTER — Other Ambulatory Visit: Payer: Self-pay | Admitting: Physician Assistant

## 2024-08-19 DIAGNOSIS — Z1231 Encounter for screening mammogram for malignant neoplasm of breast: Secondary | ICD-10-CM

## 2024-08-25 ENCOUNTER — Telehealth: Payer: Self-pay | Admitting: Physician Assistant

## 2024-08-25 ENCOUNTER — Other Ambulatory Visit: Payer: Self-pay

## 2024-08-25 DIAGNOSIS — G894 Chronic pain syndrome: Secondary | ICD-10-CM

## 2024-08-25 NOTE — Telephone Encounter (Signed)
[facility] faxed refill request for the following medications:  gabapentin (NEURONTIN) 300 MG capsule   Please advise.  

## 2024-08-25 NOTE — Telephone Encounter (Signed)
 LOV 06/10/24 NOv none LRF 07/06/24 100 x 0

## 2024-08-26 MED ORDER — GABAPENTIN 300 MG PO CAPS: 300.0000 mg | ORAL_CAPSULE | Freq: Every day | ORAL | 0 refills | Status: AC

## 2024-08-26 NOTE — Telephone Encounter (Signed)
 A courtesy refill. Please advise the patient to send her request for pain medication refills directly to the pain clinic

## 2024-09-01 ENCOUNTER — Other Ambulatory Visit: Payer: Self-pay | Admitting: Physician Assistant

## 2024-09-01 DIAGNOSIS — M797 Fibromyalgia: Secondary | ICD-10-CM

## 2024-09-01 DIAGNOSIS — M51379 Other intervertebral disc degeneration, lumbosacral region without mention of lumbar back pain or lower extremity pain: Secondary | ICD-10-CM

## 2024-09-01 DIAGNOSIS — G8929 Other chronic pain: Secondary | ICD-10-CM

## 2024-09-01 DIAGNOSIS — M542 Cervicalgia: Secondary | ICD-10-CM

## 2024-09-02 ENCOUNTER — Ambulatory Visit: Payer: Self-pay

## 2024-09-03 NOTE — Progress Notes (Unsigned)
" ° ° °  GYNECOLOGY PROGRESS NOTE  Subjective:    Patient ID: Barbara Anderson, female    DOB: [birth year], 54 y.o.   MRN: [id number]  HPI  Patient is a 54 y.o. [id number] female who presents for cervical exam to check for polyps and for evaluation for bleeding with intercourse.  {Common ambulatory SmartLinks:19316}  Review of Systems {ros; complete:30496}   Objective:   There were no vitals taken for this visit. There is no height or weight on file to calculate BMI. General appearance: {general exam:16600} Abdomen: {abdominal exam:16834} Pelvic: {pelvic exam:16852::cervix normal in appearance,external genitalia normal,no adnexal masses or tenderness,no cervical motion tenderness,rectovaginal septum normal,uterus normal size, shape, and consistency,vagina normal without discharge} Extremities: {extremity exam:5109} Neurologic: {neuro exam:17854}   Assessment:   No diagnosis found.   Plan:   There are no diagnoses linked to this encounter.    Damien Parsley, CNM [facility]

## 2024-09-07 ENCOUNTER — Ambulatory Visit: Admitting: Dermatology

## 2024-09-08 ENCOUNTER — Ambulatory Visit: Admitting: Certified Nurse Midwife

## 2024-09-23 ENCOUNTER — Encounter
